# Patient Record
Sex: Male | Born: 1970
Health system: Southern US, Community
[De-identification: ages and names within clinical notes are randomized; demographics above are authoritative.]

## PROBLEM LIST (undated history)

## (undated) DIAGNOSIS — M069 Rheumatoid arthritis, unspecified: Secondary | ICD-10-CM

## (undated) DIAGNOSIS — R519 Headache, unspecified: Secondary | ICD-10-CM

## (undated) DIAGNOSIS — Z8719 Personal history of other diseases of the digestive system: Secondary | ICD-10-CM

## (undated) DIAGNOSIS — N200 Calculus of kidney: Secondary | ICD-10-CM

## (undated) DIAGNOSIS — F419 Anxiety disorder, unspecified: Secondary | ICD-10-CM

## (undated) DIAGNOSIS — N2589 Other disorders resulting from impaired renal tubular function: Secondary | ICD-10-CM

## (undated) DIAGNOSIS — I10 Essential (primary) hypertension: Secondary | ICD-10-CM

## (undated) DIAGNOSIS — M1990 Unspecified osteoarthritis, unspecified site: Secondary | ICD-10-CM

## (undated) DIAGNOSIS — J45909 Unspecified asthma, uncomplicated: Secondary | ICD-10-CM

## (undated) DIAGNOSIS — I319 Disease of pericardium, unspecified: Secondary | ICD-10-CM

## (undated) DIAGNOSIS — Z9889 Other specified postprocedural states: Secondary | ICD-10-CM

## (undated) DIAGNOSIS — M5136 Other intervertebral disc degeneration, lumbar region: Secondary | ICD-10-CM

## (undated) DIAGNOSIS — F329 Major depressive disorder, single episode, unspecified: Secondary | ICD-10-CM

## (undated) DIAGNOSIS — F32A Depression, unspecified: Secondary | ICD-10-CM

## (undated) DIAGNOSIS — R51 Headache: Secondary | ICD-10-CM

## (undated) HISTORY — DX: Depression, unspecified: F32.A

## (undated) HISTORY — PX: KIDNEY STONE SURGERY: SHX686

## (undated) HISTORY — PX: HEMORROIDECTOMY: SUR656

## (undated) HISTORY — DX: Major depressive disorder, single episode, unspecified: F32.9

## (undated) HISTORY — DX: Essential (primary) hypertension: I10

## (undated) HISTORY — PX: ANTERIOR CERVICAL DECOMP/DISCECTOMY FUSION: SHX1161

## (undated) HISTORY — DX: Other intervertebral disc degeneration, lumbar region: M51.36

## (undated) HISTORY — DX: Calculus of kidney: N20.0

## (undated) HISTORY — DX: Unspecified osteoarthritis, unspecified site: M19.90

---

## 2002-04-08 ENCOUNTER — Encounter: Payer: Self-pay | Admitting: Internal Medicine

## 2002-04-08 ENCOUNTER — Emergency Department (HOSPITAL_COMMUNITY): Admission: EM | Admit: 2002-04-08 | Discharge: 2002-04-08 | Payer: Self-pay | Admitting: Internal Medicine

## 2004-09-12 ENCOUNTER — Emergency Department (HOSPITAL_COMMUNITY): Admission: EM | Admit: 2004-09-12 | Discharge: 2004-09-12 | Payer: Self-pay | Admitting: *Deleted

## 2004-09-14 ENCOUNTER — Emergency Department (HOSPITAL_COMMUNITY): Admission: EM | Admit: 2004-09-14 | Discharge: 2004-09-14 | Payer: Self-pay | Admitting: Emergency Medicine

## 2004-09-15 ENCOUNTER — Observation Stay (HOSPITAL_COMMUNITY): Admission: AD | Admit: 2004-09-15 | Discharge: 2004-09-16 | Payer: Self-pay | Admitting: Urology

## 2004-09-17 ENCOUNTER — Observation Stay (HOSPITAL_COMMUNITY): Admission: AD | Admit: 2004-09-17 | Discharge: 2004-09-18 | Payer: Self-pay | Admitting: Urology

## 2004-09-24 ENCOUNTER — Ambulatory Visit (HOSPITAL_COMMUNITY): Admission: RE | Admit: 2004-09-24 | Discharge: 2004-09-24 | Payer: Self-pay | Admitting: Urology

## 2004-09-26 ENCOUNTER — Ambulatory Visit (HOSPITAL_COMMUNITY): Admission: RE | Admit: 2004-09-26 | Discharge: 2004-09-26 | Payer: Self-pay | Admitting: Urology

## 2004-10-02 ENCOUNTER — Ambulatory Visit (HOSPITAL_COMMUNITY): Admission: RE | Admit: 2004-10-02 | Discharge: 2004-10-02 | Payer: Self-pay | Admitting: Urology

## 2004-12-22 ENCOUNTER — Emergency Department (HOSPITAL_COMMUNITY): Admission: EM | Admit: 2004-12-22 | Discharge: 2004-12-22 | Payer: Self-pay | Admitting: Emergency Medicine

## 2006-12-09 ENCOUNTER — Emergency Department (HOSPITAL_COMMUNITY): Admission: EM | Admit: 2006-12-09 | Discharge: 2006-12-09 | Payer: Self-pay | Admitting: Emergency Medicine

## 2008-03-12 ENCOUNTER — Ambulatory Visit (HOSPITAL_COMMUNITY): Admission: RE | Admit: 2008-03-12 | Discharge: 2008-03-12 | Payer: Self-pay | Admitting: Orthopaedic Surgery

## 2008-05-14 ENCOUNTER — Ambulatory Visit (HOSPITAL_COMMUNITY): Admission: RE | Admit: 2008-05-14 | Discharge: 2008-05-14 | Payer: Self-pay | Admitting: Orthopaedic Surgery

## 2009-02-17 ENCOUNTER — Emergency Department (HOSPITAL_COMMUNITY): Admission: EM | Admit: 2009-02-17 | Discharge: 2009-02-18 | Payer: Self-pay | Admitting: Emergency Medicine

## 2009-02-19 ENCOUNTER — Encounter: Admission: RE | Admit: 2009-02-19 | Discharge: 2009-02-19 | Payer: Self-pay | Admitting: Family Medicine

## 2009-02-27 ENCOUNTER — Encounter: Admission: RE | Admit: 2009-02-27 | Discharge: 2009-02-27 | Payer: Self-pay | Admitting: Family Medicine

## 2009-09-20 ENCOUNTER — Emergency Department (HOSPITAL_COMMUNITY)
Admission: EM | Admit: 2009-09-20 | Discharge: 2009-09-21 | Payer: Self-pay | Source: Home / Self Care | Admitting: Emergency Medicine

## 2010-02-16 ENCOUNTER — Encounter: Payer: Self-pay | Admitting: Family Medicine

## 2010-04-11 LAB — DIFFERENTIAL
Basophils Absolute: 0 10*3/uL (ref 0.0–0.1)
Basophils Relative: 0 % (ref 0–1)
Eosinophils Absolute: 0.2 10*3/uL (ref 0.0–0.7)
Eosinophils Relative: 2 % (ref 0–5)
Lymphocytes Relative: 28 % (ref 12–46)
Lymphs Abs: 3.3 10*3/uL (ref 0.7–4.0)
Monocytes Absolute: 1.2 10*3/uL — ABNORMAL HIGH (ref 0.1–1.0)
Monocytes Relative: 10 % (ref 3–12)
Neutro Abs: 7.1 10*3/uL (ref 1.7–7.7)
Neutrophils Relative %: 60 % (ref 43–77)

## 2010-04-11 LAB — CBC
HCT: 39.5 % (ref 39.0–52.0)
Hemoglobin: 14 g/dL (ref 13.0–17.0)
MCH: 30.1 pg (ref 26.0–34.0)
MCHC: 35.4 g/dL (ref 30.0–36.0)
MCV: 84.9 fL (ref 78.0–100.0)
Platelets: 304 10*3/uL (ref 150–400)
RBC: 4.65 MIL/uL (ref 4.22–5.81)
RDW: 13.8 % (ref 11.5–15.5)
WBC: 11.9 10*3/uL — ABNORMAL HIGH (ref 4.0–10.5)

## 2010-04-11 LAB — URINALYSIS, ROUTINE W REFLEX MICROSCOPIC
Bilirubin Urine: NEGATIVE
Glucose, UA: NEGATIVE mg/dL
Hgb urine dipstick: NEGATIVE
Ketones, ur: NEGATIVE mg/dL
Nitrite: NEGATIVE
Protein, ur: NEGATIVE mg/dL
Specific Gravity, Urine: 1.025 (ref 1.005–1.030)
Urobilinogen, UA: 0.2 mg/dL (ref 0.0–1.0)
pH: 6 (ref 5.0–8.0)

## 2010-04-11 LAB — COMPREHENSIVE METABOLIC PANEL
ALT: 54 U/L — ABNORMAL HIGH (ref 0–53)
AST: 37 U/L (ref 0–37)
Albumin: 4 g/dL (ref 3.5–5.2)
Alkaline Phosphatase: 39 U/L (ref 39–117)
BUN: 10 mg/dL (ref 6–23)
CO2: 28 mEq/L (ref 19–32)
Calcium: 9.3 mg/dL (ref 8.4–10.5)
Chloride: 106 mEq/L (ref 96–112)
Creatinine, Ser: 1 mg/dL (ref 0.4–1.5)
GFR calc Af Amer: >60 mL/min (ref >60)
GFR calc non Af Amer: >60 mL/min (ref >60)
Glucose, Bld: 110 mg/dL — ABNORMAL HIGH (ref 70–99)
Potassium: 3.8 mEq/L (ref 3.5–5.1)
Sodium: 139 mEq/L (ref 135–145)
Total Bilirubin: 0.2 mg/dL — ABNORMAL LOW (ref 0.3–1.2)
Total Protein: 6.8 g/dL (ref 6.0–8.3)

## 2010-04-11 LAB — URINE CULTURE
Colony Count: NO GROWTH
Culture  Setup Time: 201108271112
Culture: NO GROWTH

## 2010-04-11 LAB — POCT CARDIAC MARKERS
CKMB, poc: <1 ng/mL — ABNORMAL LOW (ref 1.0–8.0)
CKMB, poc: <1 ng/mL — ABNORMAL LOW (ref 1.0–8.0)
Myoglobin, poc: 60.4 ng/mL (ref 12–200)
Myoglobin, poc: 82 ng/mL (ref 12–200)
Troponin i, poc: <0.05 ng/mL (ref 0.00–0.09)
Troponin i, poc: <0.05 ng/mL (ref 0.00–0.09)

## 2010-04-11 LAB — LIPASE, BLOOD: Lipase: 22 U/L (ref 11–59)

## 2010-04-14 LAB — URINALYSIS, ROUTINE W REFLEX MICROSCOPIC
Bilirubin Urine: NEGATIVE
Glucose, UA: NEGATIVE mg/dL
Hgb urine dipstick: NEGATIVE
Ketones, ur: NEGATIVE mg/dL
Nitrite: NEGATIVE
Protein, ur: NEGATIVE mg/dL
Specific Gravity, Urine: 1.009 (ref 1.005–1.030)
Urobilinogen, UA: 0.2 mg/dL (ref 0.0–1.0)
pH: 7.5 (ref 5.0–8.0)

## 2010-04-14 LAB — COMPREHENSIVE METABOLIC PANEL
ALT: 16 U/L (ref 0–53)
AST: 18 U/L (ref 0–37)
Albumin: 3.9 g/dL (ref 3.5–5.2)
Alkaline Phosphatase: 51 U/L (ref 39–117)
BUN: 6 mg/dL (ref 6–23)
CO2: 27 mEq/L (ref 19–32)
Calcium: 9 mg/dL (ref 8.4–10.5)
Chloride: 100 mEq/L (ref 96–112)
Creatinine, Ser: 0.86 mg/dL (ref 0.4–1.5)
GFR calc Af Amer: >60 mL/min (ref >60)
GFR calc non Af Amer: >60 mL/min (ref >60)
Glucose, Bld: 100 mg/dL — ABNORMAL HIGH (ref 70–99)
Potassium: 4 mEq/L (ref 3.5–5.1)
Sodium: 134 mEq/L — ABNORMAL LOW (ref 135–145)
Total Bilirubin: 0.2 mg/dL — ABNORMAL LOW (ref 0.3–1.2)
Total Protein: 6.9 g/dL (ref 6.0–8.3)

## 2010-04-14 LAB — CBC
HCT: 41.2 % (ref 39.0–52.0)
Hemoglobin: 14.3 g/dL (ref 13.0–17.0)
MCHC: 34.8 g/dL (ref 30.0–36.0)
MCV: 89.4 fL (ref 78.0–100.0)
Platelets: 328 10*3/uL (ref 150–400)
RBC: 4.61 MIL/uL (ref 4.22–5.81)
RDW: 13.7 % (ref 11.5–15.5)
WBC: 8.3 10*3/uL (ref 4.0–10.5)

## 2010-04-14 LAB — DIFFERENTIAL
Basophils Absolute: 0 10*3/uL (ref 0.0–0.1)
Basophils Relative: 0 % (ref 0–1)
Eosinophils Absolute: 0.1 10*3/uL (ref 0.0–0.7)
Eosinophils Relative: 2 % (ref 0–5)
Lymphocytes Relative: 26 % (ref 12–46)
Lymphs Abs: 2.2 10*3/uL (ref 0.7–4.0)
Monocytes Absolute: 0.6 10*3/uL (ref 0.1–1.0)
Monocytes Relative: 7 % (ref 3–12)
Neutro Abs: 5.4 10*3/uL (ref 1.7–7.7)
Neutrophils Relative %: 65 % (ref 43–77)

## 2010-04-14 LAB — LIPASE, BLOOD: Lipase: 21 U/L (ref 11–59)

## 2010-04-16 ENCOUNTER — Other Ambulatory Visit (HOSPITAL_COMMUNITY): Payer: Self-pay | Admitting: Orthopaedic Surgery

## 2010-04-16 DIAGNOSIS — R52 Pain, unspecified: Secondary | ICD-10-CM

## 2010-04-21 ENCOUNTER — Ambulatory Visit (HOSPITAL_COMMUNITY)
Admission: RE | Admit: 2010-04-21 | Discharge: 2010-04-21 | Disposition: A | Discharge status: Home / Self Care | Payer: PRIVATE HEALTH INSURANCE | Source: Ambulatory Visit | Attending: Orthopaedic Surgery | Admitting: Orthopaedic Surgery

## 2010-04-21 DIAGNOSIS — R52 Pain, unspecified: Secondary | ICD-10-CM

## 2010-04-21 DIAGNOSIS — M25539 Pain in unspecified wrist: Secondary | ICD-10-CM | POA: Insufficient documentation

## 2010-04-21 DIAGNOSIS — R937 Abnormal findings on diagnostic imaging of other parts of musculoskeletal system: Secondary | ICD-10-CM | POA: Insufficient documentation

## 2010-06-13 NOTE — H&P (Signed)
NAMEGEO, SLONE                ACCOUNT NO.:  [id number]   MEDICAL RECORD NO.:  [id number]          PATIENT TYPE:  OBV   LOCATION:  A307                          FACILITY:  [facility]   PHYSICIAN:  Dennie Maizes, M.D.   DATE OF BIRTH:  09-19-1970   DATE OF ADMISSION:  09/17/2004  DATE OF DISCHARGE:  LH                                HISTORY & PHYSICAL   CHIEF COMPLAINT:  Severe left flank pain radiating to the front, nausea,  vomiting.   HISTORY OF PRESENT ILLNESS:  This 40 year old male has a past history of  recurrent ureterolithiasis.  He has had kidney stones for the past five  years.  He has passed stones in the urine.  His last stone was passed about  a year ago.  He came to the emergency room on September 12, 2004 with severe  left flank pain radiating to the front, associated with nausea.  Evaluation  was done with a noncontrast CT scan of the abdomen and pelvis.  This  revealed a 4 mm sized left proximal ureteral calculus with obstruction.  He  was sent home with p.o. medications.  His pain was not adequately  controlled, and he returned to the emergency room on September 15, 2004.  I saw  him in the office, and he was in severe pain.  He was admitted to the  hospital for pain control and further treatment.  After 24 hours, he had  good pain control, and he was sent home.  The patient wanted to be  discharged.  There is no history of fever, chills, voiding difficulty, or  gross hematuria.  An x-ray, KUB, done at that time revealed a 6x3 mm sized  left ureteral calculus at the level of L4 transverse process.  The patient  had recurrent severe pain today, and he was unable to eat and drink.  He was  admitted to the hospital for pain control and definitive treatment of the  stone.   PAST MEDICAL HISTORY:  1.  Heart disease.  2.  History of pericarditis.  3.  Status post hemorrhoidectomy.  4.  History of recurrent ureterolithiasis.   MEDICATIONS:  1.  Xanax 1 mg p.o. q.6h. p.r.n.  anxiety.  2.  Tylox p.r.n. pain.   ALLERGIES:  The patient is allergic to HYDROCODONE and BENZOCAINE.   PHYSICAL EXAMINATION:  GENERAL:  The patient was in severe pain.  HEENT:  Normal.  NECK:  No masses.  LUNGS:  Clear to auscultation.  HEART:  Regular rate and rhythm.  No murmurs.  ABDOMEN:  Soft.  No palpable or flank mass.  Moderate left costovertebral  angle tenderness, as noted.  Bladder is not palpable.  No suprapubic  tenderness.  GENITOURINARY:  Penis and testis normal.   IMPRESSION:  Left upper ureteral calculus obstruction, left hydronephrosis,  left renal colic.   PLAN:  Will admit the patient to the hospital.  1.  IV fluids.  2.  Narcotics.  3.  Check x-ray of the KUB area.  4.  I discussed with the patient and his wife regarding their management  options.  He is scheduled to undergo cystoscopy, left retrograde      pyelogram, left ureteroscopy stone extraction, and ureteral stent      placement under anesthesia on September 18, 2004.  I explained to them      regarding the diagnosis, operative details, alternate treatments,      outcome, possible risks and complications, and they are in agreement for      the procedure to be done.  The ureteral stent alone is placed.  He will      require ESWL as an outpatient at a later date.      Dennie Maizes, M.D.  Electronically Signed     SK/MEDQ  D:  09/17/2004  T:  09/17/2004  Job:  [id number]

## 2010-06-13 NOTE — Op Note (Signed)
[name], [name]                ACCOUNT NO.:  [id number]   MEDICAL RECORD NO.:  [id number]          PATIENT TYPE:  AMB   LOCATION:  DAY                           FACILITY:  [facility]   PHYSICIAN:  Dennie Maizes, M.D.   DATE OF BIRTH:  [birth year]   DATE OF PROCEDURE:  10/02/2004  DATE OF DISCHARGE:                                 OPERATIVE REPORT   PREOP DIAGNOSIS:  Left upper ureteral calculus with obstruction, post stent  placement and ESL.   POSTOP DIAGNOSIS:  Left upper ureteral calculus with obstruction, post stent  placement and ESL.   OPERATIVE PROCEDURE:  Cystoscopy, removal of the left ureteral stent.   ANESTHESIA:  Monitored anesthesia care.   SURGEON:  Dennie Maizes, M.D.   COMPLICATIONS:  None.   DRAINS:  None.   SPECIMEN:  Left ureteral stent which was discarded.   INDICATIONS FOR PROCEDURE:  This 40 year old male was admitted to hospital  with severe left flank pain. He had a left upper ureteral calculus with  obstruction. Cystoscopy, left ureteral stent placement and ESWL of the stone  were done. Follow-up x-rays revealed the stone has been fragmented well. The  patient was brought to the OR today for cystoscopy and stent removal.   DESCRIPTION OF PROCEDURE:  With IV sedation, the patient was placed on the  OR table in the dorsolithotomy position. The lower abdomen and genitalia  were prepped and draped in a sterile fashion.  About 5 mL of 2% Xylocaine  jelly was instilled into the urethra and the penis was clamped for about 5  minutes. Cystoscopy was done with a 22-French scope. The urethra, prostate  and bladder were normal. The lower end of the ureteral stent was then held  with a grasping forceps and removed without any difficulty. The patient was  transferred to the PACU in satisfactory condition.      Dennie Maizes, M.D.  Electronically Signed     SK/MEDQ  D:  10/02/2004  T:  10/02/2004  Job:  [id number]

## 2010-06-13 NOTE — Op Note (Signed)
[name], [name]                ACCOUNT NO.:  [id number]   MEDICAL RECORD NO.:  [id number]          PATIENT TYPE:  OBV   LOCATION:  A307                          FACILITY:  [facility]   PHYSICIAN:  Dennie Maizes, M.D.   DATE OF BIRTH:  1970-03-23   DATE OF PROCEDURE:  09/18/2004  DATE OF DISCHARGE:                                 OPERATIVE REPORT   PREOPERATIVE DIAGNOSIS:  Left upper ureteral calculus with obstruction, left  renal colic, left hydronephrosis.   POSTOPERATIVE DIAGNOSIS:  Left upper ureteral calculus with obstruction,  left renal colic, left hydronephrosis.   OPERATIVE PROCEDURE:  Cystoscopy, left retrograde pyelogram and left  ureteral stent placement.   ANESTHESIA:  General.   SURGEON:  Dr. Rito Ehrlich.   COMPLICATIONS:  None.   ESTIMATED BLOOD LOSS:  None.   DRAINS:  6-French 26-cm size left ureteral stent.   SPECIMEN:  None.   INDICATIONS FOR PROCEDURE:  This 40 year old male has a past history of  recurrent urolithiasis. He was admitted to the hospital with severe left  flank pain. X-rays revealed a 6 x 3 mm sized left upper ureteral calculus to  the level of L4 transverse process. The patient had severe persistent pain,  and he was unable to pass the stone. He is taken to the OR today for  cystoscopy, left retrograde pyelogram and left ureteral stent placement.   DESCRIPTION OF PROCEDURE:  General anesthesia was induced, and the patient  was placed on the OR table in the dorsolithotomy position. The lower abdomen  and genitalia were prepped and draped in a sterile fashion. Cystoscopy was  done with a 24-French scope. The appearance of the prostate, ureter and  bladder were normal. A 5-French wedge catheter was then placed in the left  ureteral orifice. Retrograde pyelogram was done by using about 7 cc of  Renografin 60. This revealed a large filling defect at level of L4  transverse process in the left ureter. There is proximal hydroureter and  hydronephrosis.   A 5-French open-ended catheter was then placed in the left distal ureter. A  0.038-inch  Benson guide with the flexible tip was then advanced into the  left renal pelvis. Open-ended catheter was then removed. A 6-French 26-cm  size stent was then inserted into left collecting system. The cystoscope was  removed. The patient was transferred to the PACU in a satisfactory  condition.   The plan is to treat the obstructing stone with ESL as an outpatient at a  later date.      Dennie Maizes, M.D.  Electronically Signed     SK/MEDQ  D:  09/18/2004  T:  09/18/2004  Job:  [id number]

## 2010-06-13 NOTE — H&P (Signed)
NAMENAETHAN, [name]                ACCOUNT NO.:  [id number]   MEDICAL RECORD NO.:  [id number]          PATIENT TYPE:  INP   LOCATION:  A323                          FACILITY:  [facility]   PHYSICIAN:  Dennie Maizes, M.D.   DATE OF BIRTH:  [birth year]   DATE OF ADMISSION:  09/15/2004  DATE OF DISCHARGE:  LH                                HISTORY & PHYSICAL   CHIEF COMPLAINT:  Severe left flank pain radiating to the front, nausea.   HISTORY OF PRESENT ILLNESS:  This 40 year old male has a past history of  recurrent urolithiasis.  He has had kidney stones for the past 5 years.  He  has passed four or five stones.  The last stone was passed about a year ago.   He came to the emergency room on September 12, 2004 with severe left flank pain  radiating to the front associated with nausea.  Evaluation was done with a  noncontrast CT scan of the abdomen and pelvis.  This revealed a 4 mm size  left proximal ureteral calculus, mild hydronephrosis and perinephric edema.  The patient was sent home with p.o. medications.  His pain was not  adequately and he returned to the emergency room again on September 15, 2004.  I seen him in the office earlier today.  He was in severe pain.  He has been  admitted to the hospital for pain control and further treatment.  The  patient denied having any fever, chills, voiding difficulty or gross  hematuria.   PAST MEDICAL HISTORY:  Heart disease, history of pericarditis, status post  hemorrhoidectomy, history of recurrent urolithiasis.   MEDICATIONS:  Xanax 1 mg one p.o. q.6 h. p.r.n. anxiety.   ALLERGIES:  The patient is allergic to HYDROCODONE, ACETAMINOPHEN and  BENZOCAINE.   EXAMINATION:  GENERAL:  The patient was in severe pain.  HEENT:  Head, eyes, ears, nose and throat normal.  NECK:  No masses.  CHEST:  Lungs clear to auscultation.  Heart regular rate and rhythm, no  murmurs.  ABDOMEN:  Abdomen soft, no palpable flank mass, moderate left costovertebral  angle tenderness was noted, bladder not palpable, no suprapubic tenderness.  GU:  Penis and testes are normal.   ADMISSION LABORATORIES:  CBC:  WBC 8.6, hemoglobin 12.4, hematocrit 35.2.  BUN 5, creatinine 0.9, glucose is mildly elevated at 115, calcium is 8.8,  electrolytes within normal range.  Urinalysis:  Blood large, rbc's 2-3 per  high-power field, nitrites negative, leukocyte esterase negative.   IMPRESSION:  Left upper ureteral calculus with obstruction, left  hydronephrosis, left renal colic.   PLAN:  1.  Admit the patient to the hospital.  2.  IV fluids.  3.  Parenteral  narcotics.  4.  Check x-ray of the KUB area.  5.  Strain all urine.  6.  Discuss with the patient regarding management options.      Dennie Maizes, M.D.  Electronically Signed     SK/MEDQ  D:  09/15/2004  T:  09/15/2004  Job:  [id number]

## 2010-06-13 NOTE — H&P (Signed)
[name], [name]                ACCOUNT NO.:  [id number]   MEDICAL RECORD NO.:  [id number]          PATIENT TYPE:  AMB   LOCATION:  DAY                           FACILITY:  [facility]   PHYSICIAN:  Dennie Maizes, M.D.   DATE OF BIRTH:  1971/01/12   DATE OF ADMISSION:  09/24/2004  DATE OF DISCHARGE:  LH                                HISTORY & PHYSICAL   CHIEF COMPLAINT:  Left upper ureteral calculous obstruction, status post  left ureteral stent placement.   HISTORY OF PRESENT ILLNESS:  A 40 year old male with a history of recurrent  urolithiasis.  He has had several stones in the past five years.  Has passed  four stones so far.  Last one was passed one year ago.  He experienced  severe left flank pain radiating to the front, associated with nausea, about  two weeks ago. He went to the emergency room at [facility].  Evaluation was done with a non-contrast CT scan of the abdomen and pelvis  which revealed two punctate, small left renal calculi without obstruction.  The patient also had a 4 mm proximal left ureteral calculous obstruction and  perinephric edema.  He had severe persistent pain.  He was admitted to the  hospital for observation x2.  He was unable to pass the stone.  He has  undergone cystoscopy, retrograde pyelogram and left ureteral stent  placement.  He has relief of flank pain at present.  He is brought to the  short-stay center today for extracorporeal shock wave lithotripsy of the  left upper ureteral calculus.  The patient denied having any fevers, chills,  voiding difficulty or gross hematuria at present.   PAST MEDICAL HISTORY:  1.  Heart disease.  The patient has pericarditis.  2.  Anxiety.  3.  Status post hemorrhoidectomy.   MEDICATIONS:  Tylox.   ALLERGIES:  BENZOCAINE, VICODIN.   FAMILY HISTORY:  Positive for diabetes mellitus, heart disease and cancer.   PHYSICAL EXAMINATION:  VITAL SIGNS:  Height 5 feet 8 inches, weight 135  pounds.  HEENT:   Normal.  NECK:  No masses.  LUNGS:  Clear to auscultation.  HEART:  Regular rate and rhythm, no murmur.  ABDOMEN:  Soft, no palpable flank mass.  No CVA tenderness.  No suprapubic  tenderness.  GU:  Penis and testes are normal.   IMPRESSION:  Left upper ureteral calculus with obstruction, status post left  ureteral stent placement.   PLAN:  ESL of the left upper ureteral calculus with IV sedation in the short-  stay center. I have discussed with the patient regarding the diagnosis,  operative details, alternate treatments, all possible risks and  complications, and he has agreed for the procedure to be done.      Dennie Maizes, M.D.  Electronically Signed     SK/MEDQ  D:  09/23/2004  T:  09/24/2004  Job:  [id number]

## 2010-06-13 NOTE — H&P (Signed)
[name], [name]                ACCOUNT NO.:  [id number]   MEDICAL RECORD NO.:  [id number]          PATIENT TYPE:  AMB   LOCATION:  DAY                           FACILITY:  [facility]   PHYSICIAN:  Dennie Maizes, M.D.   DATE OF BIRTH:  Feb 27, 1970   DATE OF ADMISSION:  10/02/2004  DATE OF DISCHARGE:  LH                                HISTORY & PHYSICAL   CHIEF COMPLAINT:  Left upper ureteral calculus with obstruction, post  ureteral stent placement and lithotripsy.   HISTORY OF PRESENT ILLNESS:  This 40 year old male has a history of  recurrent ureterolithiasis.  He has had kidney stones for the past four or  five years.  He has passed four stones so far.  He was admitted to [facility] with severe left flank pain.  Evaluation revealed left upper  ureteral calculus, measuring 4 mm in size with obstruction.  The patient  could not pass the stone and was in severe pain.  He has undergone  cystoscopy, left ureteral stent placement, and ESWL of the calculus.  Follow-  up KUB revealed good fragmentation of the left upper ureteral calculus.  The  patient is brought to the [facility] today for cystoscopy and removal  of left ureteral stent.  He does not have any fever, chills, voiding  difficulty, or gross hematuria at present.   PAST MEDICAL HISTORY:  1.  History of heart disease.  2.  Pericarditis.  3.  Anxiety.  4.  Status post hemorrhoidectomy.   MEDICATIONS:  Tylox.   ALLERGIES:  1.  BENZOCAINE.  2.  VICODIN.   FAMILY HISTORY:  Positive for diabetes mellitus, heart disease, and cancer.   PHYSICAL EXAMINATION:  VITAL SIGNS:  Height 5 feet 8 inches, weight 135  pounds.  HEENT:  Normal.  NECK:  No masses.  LUNGS:  Clear to auscultation.  HEART:  Regular rate and rhythm, no murmurs.  ABDOMEN:  Soft, no palpable flank masses, CVA tenderness.  No suprapubic  tenderness.  GENITALIA:  Penis and testes are normal.   IMPRESSION:  Left upper ureteral calculus with  obstruction, status post left  ureteral stent placement, status post ESWL of left upper ureteral calculus.   PLAN:  Cystoscopy and removal of the left ureteral stent under anesthesia in  Short Stay Center.  I have explained to the patient and his family regarding  the diagnosis, operative details, alternate treatments, outcome, possible  risks and complications and he has agreed for the procedure to be done.      Dennie Maizes, M.D.  Electronically Signed     SK/MEDQ  D:  10/01/2004  T:  10/01/2004  Job:  [id number]

## 2010-08-25 ENCOUNTER — Encounter (HOSPITAL_COMMUNITY): Payer: Self-pay | Admitting: Radiology

## 2010-08-25 ENCOUNTER — Emergency Department (HOSPITAL_COMMUNITY): Payer: PRIVATE HEALTH INSURANCE

## 2010-08-25 ENCOUNTER — Emergency Department (HOSPITAL_COMMUNITY)
Admission: EM | Admit: 2010-08-25 | Discharge: 2010-08-25 | Disposition: A | Discharge status: Home / Self Care | Payer: PRIVATE HEALTH INSURANCE | Attending: Emergency Medicine | Admitting: Emergency Medicine

## 2010-08-25 DIAGNOSIS — N1330 Unspecified hydronephrosis: Secondary | ICD-10-CM | POA: Insufficient documentation

## 2010-08-25 DIAGNOSIS — Z862 Personal history of diseases of the blood and blood-forming organs and certain disorders involving the immune mechanism: Secondary | ICD-10-CM | POA: Insufficient documentation

## 2010-08-25 DIAGNOSIS — Z8639 Personal history of other endocrine, nutritional and metabolic disease: Secondary | ICD-10-CM | POA: Insufficient documentation

## 2010-08-25 DIAGNOSIS — Z79899 Other long term (current) drug therapy: Secondary | ICD-10-CM | POA: Insufficient documentation

## 2010-08-25 DIAGNOSIS — R339 Retention of urine, unspecified: Secondary | ICD-10-CM | POA: Insufficient documentation

## 2010-08-25 DIAGNOSIS — N201 Calculus of ureter: Secondary | ICD-10-CM | POA: Insufficient documentation

## 2010-08-25 DIAGNOSIS — N390 Urinary tract infection, site not specified: Secondary | ICD-10-CM | POA: Insufficient documentation

## 2010-08-25 DIAGNOSIS — N3943 Post-void dribbling: Secondary | ICD-10-CM | POA: Insufficient documentation

## 2010-08-25 DIAGNOSIS — R109 Unspecified abdominal pain: Secondary | ICD-10-CM | POA: Insufficient documentation

## 2010-08-25 DIAGNOSIS — R112 Nausea with vomiting, unspecified: Secondary | ICD-10-CM | POA: Insufficient documentation

## 2010-08-25 DIAGNOSIS — M129 Arthropathy, unspecified: Secondary | ICD-10-CM | POA: Insufficient documentation

## 2010-08-25 HISTORY — DX: Calculus of kidney: N20.0

## 2010-08-25 HISTORY — DX: Other specified postprocedural states: Z98.890

## 2010-08-25 HISTORY — DX: Disease of pericardium, unspecified: I31.9

## 2010-08-25 HISTORY — DX: Other disorders resulting from impaired renal tubular function: N25.89

## 2010-08-25 LAB — URINALYSIS, ROUTINE W REFLEX MICROSCOPIC
Bilirubin Urine: NEGATIVE
Glucose, UA: NEGATIVE mg/dL
Ketones, ur: NEGATIVE mg/dL
Nitrite: NEGATIVE
Protein, ur: NEGATIVE mg/dL
Specific Gravity, Urine: 1.017 (ref 1.005–1.030)
Urobilinogen, UA: 0.2 mg/dL (ref 0.0–1.0)
pH: 7.5 (ref 5.0–8.0)

## 2010-08-25 LAB — URINE MICROSCOPIC-ADD ON

## 2010-08-25 LAB — POCT I-STAT, CHEM 8
BUN: 13 mg/dL (ref 6–23)
Calcium, Ion: 1.21 mmol/L (ref 1.12–1.32)
Chloride: 105 mEq/L (ref 96–112)
Creatinine, Ser: 1.2 mg/dL (ref 0.50–1.35)
Glucose, Bld: 153 mg/dL — ABNORMAL HIGH (ref 70–99)
HCT: 44 % (ref 39.0–52.0)
Hemoglobin: 15 g/dL (ref 13.0–17.0)
Potassium: 4 mEq/L (ref 3.5–5.1)
Sodium: 140 mEq/L (ref 135–145)
TCO2: 23 mmol/L (ref 0–100)

## 2010-08-26 LAB — URINE CULTURE
Colony Count: NO GROWTH
Culture  Setup Time: 201207301718
Culture: NO GROWTH

## 2010-08-29 ENCOUNTER — Ambulatory Visit (HOSPITAL_COMMUNITY)
Admission: RE | Admit: 2010-08-29 | Discharge: 2010-08-29 | Disposition: A | Discharge status: Home / Self Care | Payer: PRIVATE HEALTH INSURANCE | Source: Ambulatory Visit | Attending: Urology | Admitting: Urology

## 2010-08-29 DIAGNOSIS — F17200 Nicotine dependence, unspecified, uncomplicated: Secondary | ICD-10-CM | POA: Insufficient documentation

## 2010-08-29 DIAGNOSIS — M109 Gout, unspecified: Secondary | ICD-10-CM | POA: Insufficient documentation

## 2010-08-29 DIAGNOSIS — I10 Essential (primary) hypertension: Secondary | ICD-10-CM | POA: Insufficient documentation

## 2010-08-29 DIAGNOSIS — J45909 Unspecified asthma, uncomplicated: Secondary | ICD-10-CM | POA: Insufficient documentation

## 2010-08-29 DIAGNOSIS — N201 Calculus of ureter: Secondary | ICD-10-CM | POA: Insufficient documentation

## 2010-08-29 DIAGNOSIS — R3915 Urgency of urination: Secondary | ICD-10-CM | POA: Insufficient documentation

## 2010-08-29 LAB — SURGICAL PCR SCREEN
MRSA, PCR: NEGATIVE
Staphylococcus aureus: NEGATIVE

## 2010-09-09 NOTE — Op Note (Signed)
  NAMESHED, NIXON NO.:  [id number]  MEDICAL RECORD NO.:  [id number]  LOCATION:  DAYL                         FACILITY:  [facility]  PHYSICIAN:  Excell Seltzer. Annabell Howells, M.D.    DATE OF BIRTH:  29-Jan-1970  DATE OF PROCEDURE:  08/29/2010 DATE OF DISCHARGE:  08/29/2010                              OPERATIVE REPORT   PROCEDURE:  Right ureteroscopic stone extraction.  PREOPERATIVE DIAGNOSIS:  Right UVJ stone with pain and urgency.  POSTOPERATIVE DIAGNOSIS:  Right UVJ stone with pain and urgency.  SURGEON:  Excell Seltzer. Annabell Howells, M.D.  ANESTHESIA:  General.  SPECIMENS:  Stone.  DRAIN:  None.  COMPLICATIONS:  None.  INDICATIONS:  Mr. Clabo is a 40 year old white male who presented to the office with a 5-mm right UVJ stone.  It was initially seen on CT on 07/30.  He has had persistent symptoms and after reviewing the options, he has elected to undergo ureteroscopy.  FINDINGS OF PROCEDURE:  He was taken operating room where he was given Cipro IV.  General anesthetic was induced.  He was placed in lithotomy position.  His perineum and genitalia were prepped with Betadine solution.  He was draped in usual sterile fashion.  Cystoscopy was performed using a 22-French scope and 12 and 70-degree lenses. Examination revealed a normal urethra.  The prostatic urethra was short with obstruction.  Examination of the bladder revealed no abnormalities. The left ureteral orifice was unremarkable and the right ureteral orifice demonstrated a stone at the UVJ.  A guidewire was passed by the stone and the ureteroscope was advanced over the wire then backed out and placed alongside the wire.  The stone was grasped with a nitinol basket and removed without difficulty.  It was felt that a stent was not indicated.  At this point, the wire was removed.  The patient's bladder was draining.  He was taken down from lithotomy position.  His anesthetic was reversed, he was moved to the  recovery room in stable condition.  There were no complications.     Excell Seltzer. Annabell Howells, M.D.     JJW/MEDQ  D:  09/05/2010  T:  09/06/2010  Job:  [id number]  Electronically Signed by Bjorn Pippin M.D. on 09/09/2010 08:27:16 AM

## 2010-11-04 LAB — DIFFERENTIAL
Basophils Absolute: 0
Basophils Relative: 1
Eosinophils Absolute: 0.1 — ABNORMAL LOW
Eosinophils Relative: 1
Lymphocytes Relative: 25
Lymphs Abs: 2.2
Monocytes Absolute: 0.6
Monocytes Relative: 6
Neutro Abs: 6
Neutrophils Relative %: 67

## 2010-11-04 LAB — BASIC METABOLIC PANEL
BUN: 16
CO2: 30
Calcium: 9.8
Chloride: 102
Creatinine, Ser: 0.86
GFR calc Af Amer: >60
GFR calc non Af Amer: >60
Glucose, Bld: 98
Potassium: 4.4
Sodium: 139

## 2010-11-04 LAB — CBC
HCT: 43.9
Hemoglobin: 14.9
MCHC: 34
MCV: 86.2
Platelets: 363
RBC: 5.09
RDW: 13.3
WBC: 8.9

## 2010-11-04 LAB — POCT CARDIAC MARKERS
CKMB, poc: <1 — ABNORMAL LOW
Myoglobin, poc: 72.6
Operator id: 216471
Troponin i, poc: <0.05

## 2010-11-04 LAB — D-DIMER, QUANTITATIVE: D-Dimer, Quant: 0.62 — ABNORMAL HIGH

## 2011-05-15 ENCOUNTER — Emergency Department (HOSPITAL_COMMUNITY)
Admission: EM | Admit: 2011-05-15 | Discharge: 2011-05-16 | Disposition: A | Discharge status: Home / Self Care | Payer: 59 | Attending: Emergency Medicine | Admitting: Emergency Medicine

## 2011-05-15 DIAGNOSIS — Y92009 Unspecified place in unspecified non-institutional (private) residence as the place of occurrence of the external cause: Secondary | ICD-10-CM | POA: Insufficient documentation

## 2011-05-15 DIAGNOSIS — S8990XA Unspecified injury of unspecified lower leg, initial encounter: Secondary | ICD-10-CM

## 2011-05-15 DIAGNOSIS — M069 Rheumatoid arthritis, unspecified: Secondary | ICD-10-CM | POA: Insufficient documentation

## 2011-05-15 DIAGNOSIS — M79609 Pain in unspecified limb: Secondary | ICD-10-CM | POA: Insufficient documentation

## 2011-05-15 DIAGNOSIS — I10 Essential (primary) hypertension: Secondary | ICD-10-CM | POA: Insufficient documentation

## 2011-05-15 HISTORY — DX: Essential (primary) hypertension: I10

## 2011-05-15 HISTORY — DX: Anxiety disorder, unspecified: F41.9

## 2011-05-15 HISTORY — DX: Rheumatoid arthritis, unspecified: M06.9

## 2011-05-15 NOTE — ED Notes (Signed)
Per pt, he was thrown off horse last Friday; pt having pain in R leg, has brace on; pt reports pain is on R lateral side of leg starting right under knee; reports that he heard something pop in leg; pt reports using ice and ibuprofen at home with no relief, last dose at 20:00; pt has + sensation; pt has knot on front of leg; walking makes pain worse; pt ambulatory to triage

## 2011-05-16 ENCOUNTER — Emergency Department (HOSPITAL_COMMUNITY): Payer: 59

## 2011-05-16 ENCOUNTER — Encounter (HOSPITAL_COMMUNITY): Payer: Self-pay | Admitting: Emergency Medicine

## 2011-05-16 MED ORDER — HYDROCODONE-ACETAMINOPHEN 5-325 MG PO TABS: 1.0000 | ORAL_TABLET | ORAL | Status: AC | PRN

## 2011-05-16 MED ORDER — HYDROCODONE-ACETAMINOPHEN 5-325 MG PO TABS
1.0000 | ORAL_TABLET | Freq: Once | ORAL | Status: AC
  Administered 2011-05-16: 1 via ORAL
  Filled 2011-05-16: qty 1

## 2011-05-16 NOTE — ED Provider Notes (Signed)
Medical screening examination/treatment/procedure(s) were performed by non-physician practitioner and as supervising physician I was immediately available for consultation/collaboration.  Leif Loflin, MD 05/16/11 0341 

## 2011-05-16 NOTE — Discharge Instructions (Signed)
YOUR X-RAY IS NEGATIVE FOR ANY FRACTURES AND YOUR EXAM SUPPORTS A POTENTIAL TENDON INJURY. USE THE IMMOBILIZER FOR COMFORT, WEIGHT BEARING AS TOLERATED. FOLLOW UP WITH DR. BLACKMAN FOR FURTHER EVALUATION BY CALLING HIS OFFICE TO SCHEDULE A TIME TO BE SEEN. NORCO FOR PAIN AS NEEDED.

## 2011-05-16 NOTE — ED Provider Notes (Signed)
History     CSN: [id number]  Arrival date & time 05/15/11  2350   First MD Initiated Contact with Patient 05/16/11 0047      Chief Complaint  Patient presents with  . Leg Pain    (Consider location/radiation/quality/duration/timing/severity/associated sxs/prior treatment) Patient is a 41 y.o. male presenting with leg pain. The history is provided by the patient and the spouse.  Leg Pain  The incident occurred more than 1 week ago. The incident occurred at home. Injury mechanism: He was thrown from a horse and landed standing with full impact on right leg. Pain location: Since the fall, he has pain in the lateral and proximal right lower leg that extends into lateral knee when he bears weight, plantar- or dorsi-flexes foot. The pain is moderate.    Past Medical History  Diagnosis Date  . Pericarditis   . History of lithotripsy   . Rheumatoid arthritis   . Gout   . Hypertension   . Kidney stones   . Renal tubular acidosis   . Anxiety     Past Surgical History  Procedure Date  . Hemorroidectomy   . Kidney stone surgery     History reviewed. No pertinent family history.  History  Substance Use Topics  . Smoking status: Current Everyday Smoker -- 1.0 packs/day  . Smokeless tobacco: Not on file  . Alcohol Use: No      Review of Systems  Respiratory: Negative.  Negative for shortness of breath.   Cardiovascular: Negative.  Negative for chest pain.  Musculoskeletal: Negative.        See HPI  Skin: Negative.     Allergies  Benadryl and Benzocaine  Home Medications   Current Outpatient Rx  Name Route Sig Dispense Refill  . ALBUTEROL SULFATE HFA 108 (90 BASE) MCG/ACT IN AERS Inhalation Inhale 2 puffs into the lungs every 4 (four) hours as needed. For shortness of breath    . ALLOPURINOL 100 MG PO TABS Oral Take 100 mg by mouth daily.    . IBUPROFEN 200 MG PO TABS Oral Take 800 mg by mouth every 6 (six) hours as needed. For pain      BP 152/98  Pulse 94   Temp(Src) 97.6 F (36.4 C) (Oral)  Resp 20  SpO2 98%  Physical Exam  Constitutional: He is oriented to person, place, and time. He appears well-developed and well-nourished.  Neck: Normal range of motion.  Pulmonary/Chest: Effort normal.  Musculoskeletal: Normal range of motion.       Right leg unremarkable in appearance. No swelling, redness, or bony deformity. Calf is soft and nontender. There is point tenderness to lateral right calf at proximal 1/3 without mass or discoloration. FROM. Increased pain the plantar flexion and dorsiflexion of foot.  Neurological: He is alert and oriented to person, place, and time.  Skin: Skin is warm and dry.  Psychiatric: He has a normal mood and affect.    ED Course  Procedures (including critical care time)  Labs Reviewed - No data to display Dg Tibia/fibula Right  05/16/2011  *RADIOLOGY REPORT*  Clinical Data: Trauma, fall from horse, bilateral tib-fib pain  RIGHT TIBIA AND FIBULA - 2 VIEW  Comparison: None.  Findings: No fracture or dislocation is seen.  The joint spaces are preserved.  The visualized soft tissues are unremarkable.  IMPRESSION: No fracture or dislocation is seen.  Original Report Authenticated By: Charline Bills, M.D.     No diagnosis found.  1. Right lower extremity injury  MDM  Suspect tendon injury with persistent pain and no fracture. Will apply knee immobilizer and encourage crutch use, and ortho follow up. Pain medication given.        Rodena Medin, PA-C 05/16/11 0216

## 2011-07-27 ENCOUNTER — Ambulatory Visit: Payer: 59 | Attending: Orthopaedic Surgery | Admitting: Physical Therapy

## 2011-07-27 DIAGNOSIS — R5381 Other malaise: Secondary | ICD-10-CM | POA: Insufficient documentation

## 2011-07-27 DIAGNOSIS — IMO0001 Reserved for inherently not codable concepts without codable children: Secondary | ICD-10-CM | POA: Insufficient documentation

## 2011-07-27 DIAGNOSIS — M25569 Pain in unspecified knee: Secondary | ICD-10-CM | POA: Insufficient documentation

## 2011-07-31 ENCOUNTER — Ambulatory Visit: Payer: 59 | Admitting: Physical Therapy

## 2011-08-04 ENCOUNTER — Ambulatory Visit: Payer: 59 | Admitting: Physical Therapy

## 2011-08-06 ENCOUNTER — Ambulatory Visit: Payer: 59 | Admitting: Physical Therapy

## 2011-08-10 ENCOUNTER — Ambulatory Visit: Payer: 59 | Admitting: Physical Therapy

## 2011-10-22 ENCOUNTER — Encounter (HOSPITAL_COMMUNITY): Payer: Self-pay

## 2011-10-22 ENCOUNTER — Emergency Department (HOSPITAL_COMMUNITY)
Admission: EM | Admit: 2011-10-22 | Discharge: 2011-10-22 | Disposition: A | Discharge status: Home / Self Care | Payer: 59 | Attending: Emergency Medicine | Admitting: Emergency Medicine

## 2011-10-22 ENCOUNTER — Emergency Department (HOSPITAL_COMMUNITY): Payer: 59

## 2011-10-22 DIAGNOSIS — M069 Rheumatoid arthritis, unspecified: Secondary | ICD-10-CM | POA: Insufficient documentation

## 2011-10-22 DIAGNOSIS — X500XXA Overexertion from strenuous movement or load, initial encounter: Secondary | ICD-10-CM | POA: Insufficient documentation

## 2011-10-22 DIAGNOSIS — M549 Dorsalgia, unspecified: Secondary | ICD-10-CM

## 2011-10-22 DIAGNOSIS — M545 Low back pain, unspecified: Secondary | ICD-10-CM | POA: Insufficient documentation

## 2011-10-22 DIAGNOSIS — Z884 Allergy status to anesthetic agent status: Secondary | ICD-10-CM | POA: Insufficient documentation

## 2011-10-22 DIAGNOSIS — Z79899 Other long term (current) drug therapy: Secondary | ICD-10-CM | POA: Insufficient documentation

## 2011-10-22 DIAGNOSIS — F17200 Nicotine dependence, unspecified, uncomplicated: Secondary | ICD-10-CM | POA: Insufficient documentation

## 2011-10-22 DIAGNOSIS — Z87442 Personal history of urinary calculi: Secondary | ICD-10-CM | POA: Insufficient documentation

## 2011-10-22 DIAGNOSIS — Z888 Allergy status to other drugs, medicaments and biological substances status: Secondary | ICD-10-CM | POA: Insufficient documentation

## 2011-10-22 DIAGNOSIS — I10 Essential (primary) hypertension: Secondary | ICD-10-CM | POA: Insufficient documentation

## 2011-10-22 MED ORDER — OXYCODONE-ACETAMINOPHEN 5-325 MG PO TABS: 1.0000 | ORAL_TABLET | ORAL | Status: DC | PRN

## 2011-10-22 MED ORDER — OXYCODONE-ACETAMINOPHEN 5-325 MG PO TABS
2.0000 | ORAL_TABLET | Freq: Once | ORAL | Status: AC
  Administered 2011-10-22: 2 via ORAL
  Filled 2011-10-22: qty 2

## 2011-10-22 MED ORDER — METHOCARBAMOL 500 MG PO TABS: 500.0000 mg | ORAL_TABLET | Freq: Two times a day (BID) | ORAL | Status: DC

## 2011-10-22 MED ORDER — DIAZEPAM 5 MG PO TABS
5.0000 mg | ORAL_TABLET | Freq: Once | ORAL | Status: AC
  Administered 2011-10-22: 5 mg via ORAL
  Filled 2011-10-22: qty 1

## 2011-10-22 MED ORDER — PREDNISONE 20 MG PO TABS: 60.0000 mg | ORAL_TABLET | Freq: Every day | ORAL | Status: DC

## 2011-10-22 NOTE — ED Provider Notes (Signed)
History     CSN: [id number]  Arrival date & time 10/22/11  0035   First MD Initiated Contact with Patient 10/22/11 0122      Chief Complaint  Patient presents with  . Back Pain   HPI  History provided by the patient. Patient is a 41 year old male with history of rheumatoid arthritis, gout, hypertension, kidney stones who presents with complaints of left lower back pain. Patient reports having back pain for the past several days. He reports that approximately 6-7 days ago he sat up in bed and felt a pop in his middle lower back which caused immediate sharp pains radiating to left hip and anterior thigh area. Since that time pain has been waxing and waning but has become much worse. Pain is worse with leaning forward and standing and walking. Pain improves some with position in a recliner and pillows behind the back. Patient has been taking ibuprofen without significant relief of symptoms. Patient also reports having prior history of possible ruptured disc in back many years ago but states he never had much evaluation. Symptoms did not feel like previous kidney stones. He denies any dysuria, hematuria or urinary frequency. Patient has not had any urinary or fecal incontinence, urinary retention or perineal numbness. He denies any numbness or weakness of the feet.   Past Medical History  Diagnosis Date  . Pericarditis   . History of lithotripsy   . Rheumatoid arthritis   . Gout   . Hypertension   . Kidney stones   . Renal tubular acidosis   . Anxiety     Past Surgical History  Procedure Date  . Hemorroidectomy   . Kidney stone surgery     History reviewed. No pertinent family history.  History  Substance Use Topics  . Smoking status: Current Every Day Smoker -- 1.0 packs/day  . Smokeless tobacco: Not on file  . Alcohol Use: No      Review of Systems  Constitutional: Negative for fever and unexpected weight change.  HENT: Negative for neck pain.   Respiratory: Negative  for shortness of breath.   Cardiovascular: Negative for chest pain.  Gastrointestinal: Negative for abdominal pain.  Genitourinary: Negative for dysuria, frequency, hematuria and flank pain.  Musculoskeletal: Positive for back pain.  Neurological: Negative for weakness and numbness.    Allergies  Benadryl and Benzocaine  Home Medications   Current Outpatient Rx  Name Route Sig Dispense Refill  . ALBUTEROL SULFATE HFA 108 (90 BASE) MCG/ACT IN AERS Inhalation Inhale 2 puffs into the lungs every 4 (four) hours as needed. For shortness of breath    . ALLOPURINOL 300 MG PO TABS Oral Take 300 mg by mouth daily.    Marland Kitchen DICLOFENAC SODIUM 75 MG PO TBEC Oral Take 75 mg by mouth daily as needed. Swelling and pain    . IBUPROFEN 200 MG PO TABS Oral Take 800 mg by mouth every 6 (six) hours as needed. For pain      BP 147/88  Pulse 87  Temp 97.8 F (36.6 C) (Oral)  Resp 18  SpO2 94%  Physical Exam  Nursing note and vitals reviewed. Constitutional: He is oriented to person, place, and time. He appears well-developed and well-nourished. No distress.  HENT:  Head: Normocephalic and atraumatic.  Neck: Normal range of motion. Neck supple.  Cardiovascular: Normal rate and regular rhythm.   Pulmonary/Chest: Effort normal and breath sounds normal. No respiratory distress. He has no wheezes. He has no rales.  Abdominal: Soft. There  is no tenderness. There is no CVA tenderness.  Musculoskeletal:       Lumbar back: He exhibits tenderness and bony tenderness. He exhibits no swelling, no edema and no deformity.       Back:  Neurological: He is alert and oriented to person, place, and time. He has normal strength. No sensory deficit. Gait normal.  Skin: Skin is warm. No rash noted. No erythema.  Psychiatric: He has a normal mood and affect.    ED Course  Procedures   Dg Lumbar Spine Complete  10/22/2011  *RADIOLOGY REPORT*  Clinical Data: Low back pain.  History of herniated disc.  LUMBAR SPINE -  COMPLETE 4+ VIEW  Comparison: 08/25/2010 CT.  Findings: Five lumbar type vertebral bodies.  The alignment is anatomic.  There are no pars defects.  Vertebral body height and intervertebral disc spaces are preserved.  Tiny marginal osteophytes are present at L3-L4.  Schmorl's nodes at T12. Lumbosacral junction appears normal.  IMPRESSION: No acute abnormality.   Original Report Authenticated By: Andreas Newport, M.D.      1. Back pain       MDM  1:55 AM patient seen and evaluated. Patient appears moderately uncomfortable.  X-rays unremarkable. Patient having some improvement of pain. Similar discharge with symptomatic treatment including Percocet, prednisone and Robaxin. Patient given spine followup.      Angus Seller, [place] 10/22/11 [phone]

## 2011-10-22 NOTE — ED Notes (Addendum)
Pt reports lower back pain radiating into (L) groin and down into (L) leg x1 week. Pt reports symptoms started after he set up in the bed and heard a "poping" noise. Pt denies problems w/bowel or bladder function.

## 2011-10-22 NOTE — ED Notes (Signed)
Pt denies any further questions upon discharge. 

## 2011-10-22 NOTE — ED Provider Notes (Signed)
Medical screening examination/treatment/procedure(s) were performed by non-physician practitioner and as supervising physician I was immediately available for consultation/collaboration.  Chariti Havel K Lonya Johannesen, MD 10/22/11 0410 

## 2011-12-11 ENCOUNTER — Encounter: Payer: Self-pay | Admitting: Family Medicine

## 2011-12-11 ENCOUNTER — Ambulatory Visit (INDEPENDENT_AMBULATORY_CARE_PROVIDER_SITE_OTHER): Payer: PRIVATE HEALTH INSURANCE

## 2011-12-11 ENCOUNTER — Ambulatory Visit (INDEPENDENT_AMBULATORY_CARE_PROVIDER_SITE_OTHER): Payer: PRIVATE HEALTH INSURANCE | Admitting: Family Medicine

## 2011-12-11 VITALS — BP 137/88 | HR 101 | Ht 69.0 in | Wt 219.0 lb

## 2011-12-11 DIAGNOSIS — M25519 Pain in unspecified shoulder: Secondary | ICD-10-CM

## 2011-12-11 DIAGNOSIS — M25512 Pain in left shoulder: Secondary | ICD-10-CM

## 2011-12-11 DIAGNOSIS — M25511 Pain in right shoulder: Secondary | ICD-10-CM

## 2011-12-11 DIAGNOSIS — M51369 Other intervertebral disc degeneration, lumbar region without mention of lumbar back pain or lower extremity pain: Secondary | ICD-10-CM

## 2011-12-11 DIAGNOSIS — I10 Essential (primary) hypertension: Secondary | ICD-10-CM | POA: Insufficient documentation

## 2011-12-11 DIAGNOSIS — M2550 Pain in unspecified joint: Secondary | ICD-10-CM

## 2011-12-11 DIAGNOSIS — N200 Calculus of kidney: Secondary | ICD-10-CM

## 2011-12-11 DIAGNOSIS — G8929 Other chronic pain: Secondary | ICD-10-CM

## 2011-12-11 DIAGNOSIS — R110 Nausea: Secondary | ICD-10-CM

## 2011-12-11 DIAGNOSIS — M5136 Other intervertebral disc degeneration, lumbar region: Secondary | ICD-10-CM

## 2011-12-11 HISTORY — DX: Other intervertebral disc degeneration, lumbar region without mention of lumbar back pain or lower extremity pain: M51.369

## 2011-12-11 HISTORY — DX: Other intervertebral disc degeneration, lumbar region: M51.36

## 2011-12-11 HISTORY — DX: Calculus of kidney: N20.0

## 2011-12-11 HISTORY — DX: Essential (primary) hypertension: I10

## 2011-12-11 MED ORDER — PROMETHAZINE HCL 25 MG PO TABS: 25.0000 mg | ORAL_TABLET | Freq: Four times a day (QID) | ORAL | Status: DC | PRN

## 2011-12-11 NOTE — Progress Notes (Signed)
CC: [name] is a 41 y.o. male is here for Establish Care   Subjective: HPI:  Patient presents to establish care  Acute complaint of chronic right and left shoulder pain, bilateral knee pain, bilateral wrist pain, bilateral lateral pain in the metacarpal phalangeal joints bilaterally and proximal interphalangeal joints bilaterally. This discomfort has waxed and waned for the last years however right and left shoulder pain has become somewhat moderate to severe over the past 4 days. With respect to his other joints is not uncommon for them to swell and become red for unknown reasons without precipitating events. With respect to his shoulder pain is worse when trying to lift above his head, worse when rolling over in the middle the night, and worse when trying to reach his hand to his buttocks. He denies motor or sensory disturbances in either extremity. The pain often radiates to his neck posteriorly. 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 As a child he was told that he had a rheumatologic disease however he and his family never kept records about what this exactly was. He has a family history rheumatoid arthritis.  He denies fevers, chills, unintentional weight loss, swollen lymph nodes, trouble fighting infections, bone fractures with minimal trauma, nor eye pain or vision loss. The pain often gets to the point where he gets nauseous. Percocet helps pain to a degree, ibuprofen provides mild improvement of his pain.  Has a history of degenerative disc disease in his lumbar region, he is currently seeing Dr.David [name] who is a Designer, jewellery the patient's Percocet. He denies motor sensory disturbances and lower studies, bowel or bladder incontinence, nor saddle paresthesia   Review Of Systems Outlined In HPI  Past Medical History  Diagnosis Date  . Pericarditis   . History of lithotripsy   . Rheumatoid arthritis   . Gout   . Hypertension    . Kidney stones   . Renal tubular acidosis   . Anxiety   . Nephrolithiasis 12/11/2011  . Lumbar degenerative disc disease 12/11/2011  . Essential hypertension 12/11/2011     Family History  Problem Relation Age of Onset  . Hypertension Father   . Rheum arthritis Father      History  Substance Use Topics  . Smoking status: Current Every Day Smoker -- 1.0 packs/day  . Smokeless tobacco: Not on file  . Alcohol Use: No     Objective: Filed Vitals:   12/11/11 1518  BP: 137/88  Pulse: 101    General: Alert and Oriented, No Acute Distress HEENT: Pupils equal, round, reactive to light. Conjunctivae clear.    Neck supple without palpable lymphadenopathy nor abnormal masses. Lungs: Clear to auscultation bilaterally, no wheezing/ronchi/rales.  Comfortable work of breathing. Good air movement. Cardiac: Regular rate and rhythm. Normal S1/S2.  No murmurs, rubs, nor gallops.   Right upper extremity: Hawkins positive, neers positive speeds negative, scarf test negative, O'Brien negative, full range of motion strength throughout rotator cuff muscles tested however internal rotation behind the back reproduces pain, no pain on palpation of the a.c. Joint Left upper extremity:Hawkins positive, neers positive speeds negative, scarf test negative, O'Brien negative, full range of motion strength throughout rotator cuff muscles tested however internal rotation behind the back reproduces pain, no pain on palpation of the a.c. Joint Neuro: CN II-XII grossly intact, Spurling's negative, C5-C6 in the bilateral upper extremities 2/4 Extremities: No peripheral edema.  Strong peripheral pulses. Mild/moderate swelling in the majority of  the PIPs Mental Status: No depression, anxiety, nor agitation.   Assessment & Plan: [name] was seen today for establish care.  Diagnoses and associated orders for this visit:  Right shoulder pain - DG Shoulder Right; Future  Left shoulder pain - DG Shoulder Left;  Future  Nausea - promethazine (PHENERGAN) 25 MG tablet; Take 1 tablet (25 mg total) by mouth every 6 (six) hours as needed for nausea.  Chronic pain of multiple joints - Antinuclear Antib (ANA) - C-reactive protein - Rheumatoid Factor - Cyclic citrul peptide antibody, IgG - Sed Rate (ESR)    Presentation story and exam raise suspicions for rheumatoid arthritis, looking into this possibility with the above labs. Will determine followup based on results of above, will call patient early next week once everything is available.  Return f/u will be based on lab/flim results.

## 2011-12-12 LAB — SEDIMENTATION RATE: Sed Rate: 5 mm/hr (ref 0–16)

## 2011-12-12 LAB — RHEUMATOID FACTOR: Rhuematoid fact SerPl-aCnc: 150 IU/mL — ABNORMAL HIGH (ref <=14)

## 2011-12-12 LAB — C-REACTIVE PROTEIN: CRP: 1.6 mg/dL — ABNORMAL HIGH (ref <0.60)

## 2011-12-14 ENCOUNTER — Telehealth: Payer: Self-pay | Admitting: Family Medicine

## 2011-12-14 DIAGNOSIS — G8929 Other chronic pain: Secondary | ICD-10-CM

## 2011-12-14 DIAGNOSIS — R768 Other specified abnormal immunological findings in serum: Secondary | ICD-10-CM

## 2011-12-14 LAB — ANA: Anti Nuclear Antibody(ANA): NEGATIVE

## 2011-12-14 LAB — CYCLIC CITRUL PEPTIDE ANTIBODY, IGG: Cyclic Citrullin Peptide Ab: 102.9 U/mL — ABNORMAL HIGH (ref 0.0–5.0)

## 2011-12-14 NOTE — Telephone Encounter (Signed)
Sue Lush, Will you please let Mr. Wasley know that his xrays do not show any joint destruction nor bony abnormalities.  The blood work suggests that he may have Rheumatoid Arthritis and I'd like for him to discuss his history and these findings with a specialist to determine what long term management might be warranted.  I've placed a referral for this already.  Based on the exam last week, he may have some rotator cuff inflammation that might respond to a steroid injection which I would be happy to deliver in the near future.  Thanks, -Gregary Signs

## 2011-12-14 NOTE — Telephone Encounter (Signed)
Pt notified and will schedule appt with Dr. Ivan Anchors for injection

## 2011-12-17 ENCOUNTER — Encounter: Payer: Self-pay | Admitting: Family Medicine

## 2011-12-17 DIAGNOSIS — M069 Rheumatoid arthritis, unspecified: Secondary | ICD-10-CM | POA: Insufficient documentation

## 2011-12-18 ENCOUNTER — Ambulatory Visit (INDEPENDENT_AMBULATORY_CARE_PROVIDER_SITE_OTHER): Payer: 59 | Admitting: Family Medicine

## 2011-12-18 ENCOUNTER — Encounter: Payer: Self-pay | Admitting: Family Medicine

## 2011-12-18 VITALS — BP 131/95 | HR 75 | Ht 69.0 in | Wt 220.0 lb

## 2011-12-18 DIAGNOSIS — F32A Depression, unspecified: Secondary | ICD-10-CM

## 2011-12-18 DIAGNOSIS — Z13220 Encounter for screening for lipoid disorders: Secondary | ICD-10-CM

## 2011-12-18 DIAGNOSIS — F419 Anxiety disorder, unspecified: Secondary | ICD-10-CM | POA: Insufficient documentation

## 2011-12-18 DIAGNOSIS — I10 Essential (primary) hypertension: Secondary | ICD-10-CM

## 2011-12-18 DIAGNOSIS — F411 Generalized anxiety disorder: Secondary | ICD-10-CM

## 2011-12-18 DIAGNOSIS — M069 Rheumatoid arthritis, unspecified: Secondary | ICD-10-CM

## 2011-12-18 DIAGNOSIS — F329 Major depressive disorder, single episode, unspecified: Secondary | ICD-10-CM

## 2011-12-18 LAB — LIPID PANEL
Cholesterol: 187 mg/dL (ref 0–200)
HDL: 32 mg/dL — ABNORMAL LOW (ref >39)
LDL Cholesterol: 128 mg/dL — ABNORMAL HIGH (ref 0–99)
Total CHOL/HDL Ratio: 5.8 Ratio
Triglycerides: 135 mg/dL (ref <150)
VLDL: 27 mg/dL (ref 0–40)

## 2011-12-18 MED ORDER — CITALOPRAM HYDROBROMIDE 20 MG PO TABS: 20.0000 mg | ORAL_TABLET | Freq: Every day | ORAL | Status: DC

## 2011-12-18 NOTE — Progress Notes (Signed)
CC: [name] is a 41 y.o. male is here for Hypertension and Anxiety   Subjective: HPI:  Patient presents for followup and would like to discuss restlessness and anxiety. This is been present for matter of months. He's had trouble with similar symptoms in the past during the separation from his former wife while the same time there were multiple deaths in his family. Years ago he voluntarily committed himself at [facility] do to a nervous breakdown. Currently he describes a constant anxiousness that he is about to lose his job. He describes restlessness all hours of the day that sometimes interferes with his sleep.  Current stressors include recent diagnosis of rheumatoid arthritis, and the fact that he may need to go on disability.  He has a good support system at home, he is a smoker but denies tobacco or recreational drug use. He has been on BuSpar in the past but didn't seem to help much, he had some improvement with Xanax. He admits he feels somewhat down and depressed over the past few months on a daily basis. He denies paranoia, thoughts of wanting to harm himself or others, hallucinations, mania.  Recently diagnosed with rheumatoid arthritis, he started prednisone yesterday, he's being evaluated for candidacy for methotrexate. He still complains of bilateral shoulder pain, wrist and MCP pain that has been unchanged since his last visit.  He carries a remote diagnosis of essential hypertension. He was on an antihypertensive the name escapes him at this time. He has no outside blood pressures to report. He denies chest pain, shortness of breath, orthopnea. He does occasionally have feet swelling is out for long periods of time. He is unsure of the last time he had a cholesterol check.    Review Of Systems Outlined In HPI  Past Medical History  Diagnosis Date  . Pericarditis   . History of lithotripsy   . Rheumatoid arthritis   . Gout   . Hypertension   . Kidney stones     . Renal tubular acidosis   . Anxiety   . Nephrolithiasis 12/11/2011  . Lumbar degenerative disc disease 12/11/2011  . Essential hypertension 12/11/2011     Family History  Problem Relation Age of Onset  . Hypertension Father   . Rheum arthritis Father      History  Substance Use Topics  . Smoking status: Current Every Day Smoker -- 1.0 packs/day  . Smokeless tobacco: Not on file  . Alcohol Use: No     Objective: Filed Vitals:   12/18/11 0937  BP: 131/95  Pulse: 75    General: Alert and Oriented, No Acute Distress HEENT: Pupils equal, round, reactive to light. Conjunctivae clear.  Moist mucous membranes, pharynx without inflammation nor lesions.  Neck supple without palpable lymphadenopathy nor abnormal masses. Lungs: Clear to auscultation bilaterally, no wheezing/ronchi/rales.  Comfortable work of breathing. Good air movement. Cardiac: Regular rate and rhythm. Normal S1/S2.  No murmurs, rubs, nor gallops.  No carotid bruits Extremities: No peripheral edema.  Strong peripheral pulses.  Mental Status: No depression, anxiety. Appears restless during exam, normal and logical thought process Skin: Warm and dry.  Assessment & Plan: [name] was seen today for hypertension and anxiety.  Diagnoses and associated orders for this visit:  Rheumatoid arthritis  Essential hypertension - Lipid panel  Anxiety - citalopram (CELEXA) 20 MG tablet; Take 1 tablet (20 mg total) by mouth daily. - Ambulatory referral to Psychology  Depression - Ambulatory referral to Psychology  Lipid screening -  Lipid panel  Other Orders - predniSONE (DELTASONE) 10 MG tablet; Take 10 mg by mouth 2 (two) times daily.    Anxiety: He is open to starting Celexa, I would like to see its SSRI/SNRIs regimen we'll give him benefit before restarting old Xanax regimen. Return in 4 weeks for assessment of response. His wife sees counseling down stairs and he has agreed to see a counselor himself, he  specifically"once someone to talk to" Essential hypertension: Pre-hypertension values at last 2 visits, I discussed with him we'll hold blood pressure medication until he gets to the hypertensive state. Discussed lifestyle interventions. He is due for cholesterol screening will do that today Rheumatoid arthritis: He has followup next week  Return in about 4 weeks (around 01/15/2012).

## 2011-12-21 ENCOUNTER — Encounter: Payer: Self-pay | Admitting: Family Medicine

## 2011-12-21 DIAGNOSIS — E782 Mixed hyperlipidemia: Secondary | ICD-10-CM | POA: Insufficient documentation

## 2011-12-21 DIAGNOSIS — E785 Hyperlipidemia, unspecified: Secondary | ICD-10-CM | POA: Insufficient documentation

## 2011-12-23 LAB — CBC AND DIFFERENTIAL
Hemoglobin: 16 g/dL (ref 13.5–17.5)
Platelets: 424 10*3/uL — AB (ref 150–399)

## 2011-12-23 LAB — HEPATIC FUNCTION PANEL
ALT: 31 U/L (ref 10–40)
AST: 20 U/L (ref 14–40)

## 2011-12-23 LAB — BASIC METABOLIC PANEL: Creatinine: 0.9 mg/dL (ref <=1.3)

## 2011-12-28 ENCOUNTER — Encounter: Payer: Self-pay | Admitting: Family Medicine

## 2011-12-28 DIAGNOSIS — M5030 Other cervical disc degeneration, unspecified cervical region: Secondary | ICD-10-CM

## 2012-01-15 ENCOUNTER — Encounter: Payer: Self-pay | Admitting: Family Medicine

## 2012-01-15 ENCOUNTER — Ambulatory Visit (INDEPENDENT_AMBULATORY_CARE_PROVIDER_SITE_OTHER): Payer: 59 | Admitting: Family Medicine

## 2012-01-15 VITALS — BP 148/101 | HR 88 | Wt 224.0 lb

## 2012-01-15 DIAGNOSIS — F419 Anxiety disorder, unspecified: Secondary | ICD-10-CM

## 2012-01-15 DIAGNOSIS — F411 Generalized anxiety disorder: Secondary | ICD-10-CM

## 2012-01-15 DIAGNOSIS — I10 Essential (primary) hypertension: Secondary | ICD-10-CM

## 2012-01-15 DIAGNOSIS — E785 Hyperlipidemia, unspecified: Secondary | ICD-10-CM

## 2012-01-15 MED ORDER — CLONAZEPAM 1 MG PO TABS: ORAL_TABLET | ORAL | Status: DC

## 2012-01-15 MED ORDER — HYDROCHLOROTHIAZIDE 25 MG PO TABS: 25.0000 mg | ORAL_TABLET | Freq: Every day | ORAL | Status: DC

## 2012-01-15 NOTE — Progress Notes (Signed)
CC: [name] is a 41 y.o. male is here for Anxiety   Subjective: HPI: Patient presents followup of anxiety. He believes it's not improving. He was started on Celexa at his last visit has noticed no improvement with nervousness, restlessness, and "anxiety ". Symptoms seem to be worse since starting prednisone. When his anxiety increases he notices a posterior headache. He denies depression, paranoia, mental disturbance, hallucinations. Denies substance or alcohol use. Anxiety seems to be getting worse do to father's recent cardiac procedure and patient's upcoming neck surgery.  And multiple visits the patient's blood pressure has been elevated. He's not currently taking antihypertensives. He has no outside blood pressures to report. He's unsure as to what makes his blood pressure better or worse. I've noted he got worse since starting prednisone. He denies motor sensory disturbances, headaches, chest pain, shortness of breath, irregular heartbeat, peripheral edema.  Hyperlipidemia: Patient did not receive lab results from his last visit. He is a Framingham risk of 9%, : Goal LDL less than 130 which he has met. He denies limb claudication or rest pain. Tries to watch what he eats, no formal exercise program do to return arthritis joint pain.     Review Of Systems Outlined In HPI  Past Medical History  Diagnosis Date  . Pericarditis   . History of lithotripsy   . Rheumatoid arthritis   . Gout   . Hypertension   . Kidney stones   . Renal tubular acidosis   . Anxiety   . Nephrolithiasis 12/11/2011  . Lumbar degenerative disc disease 12/11/2011  . Essential hypertension 12/11/2011     Family History  Problem Relation Age of Onset  . Hypertension Father   . Rheum arthritis Father      History  Substance Use Topics  . Smoking status: Current Every Day Smoker -- 1.0 packs/day  . Smokeless tobacco: Not on file  . Alcohol Use: No     Objective: Filed Vitals:   01/15/12 1100   BP: 148/101  Pulse: 88    General: Alert and Oriented, No Acute Distress HEENT: Pupils equal, round, reactive to light. Conjunctivae clear.  External ears unremarkable, canals clear with intact TMs with appropriate landmarks.  Middle ear appears open without effusion. Pink inferior turbinates.  Moist mucous membranes, pharynx without inflammation nor lesions.  Neck supple without palpable lymphadenopathy nor abnormal masses. Lungs: Clear to auscultation bilaterally, no wheezing/ronchi/rales.  Comfortable work of breathing. Good air movement. Cardiac: Regular rate and rhythm. Normal S1/S2.  No murmurs, rubs, nor gallops.   Extremities: No peripheral edema.  Strong peripheral pulses.  Mental Status: No depression,  appears somewhat anxious and restless  Skin: Warm and dry.  Assessment & Plan: Alexx was seen today for anxiety.  Diagnoses and associated orders for this visit:  Anxiety - clonazePAM (KLONOPIN) 1 MG tablet; Two times a day and right before bed only as needed for anxiety.  Essential hypertension - hydrochlorothiazide (HYDRODIURIL) 25 MG tablet; Take 1 tablet (25 mg total) by mouth daily.  Hyperlipidemia ldl goal <130    Anxiety: Uncontrolled, he's done well on Xanax in the past he is open to starting Klonopin on a when necessary basis continuing Celexa Hypertension: Uncontrolled start hydrochlorothiazide Hyperlipidemia: Controlled we'll monitor annually, he's not interested in starting cholesterol medications.  He's recently started methotrexate, still taking prednisone  Return in about 4 weeks (around 02/12/2012).

## 2012-02-09 LAB — BASIC METABOLIC PANEL
Creatinine: 1.1 mg/dL (ref <=1.3)
Glucose: 116 mg/dL
Potassium: 4.5 mmol/L (ref 3.4–5.3)
Sodium: 137 mmol/L (ref 137–147)

## 2012-02-09 LAB — HEPATIC FUNCTION PANEL
ALT: 51 U/L — AB (ref 10–40)
AST: 21 U/L (ref 14–40)

## 2012-02-10 LAB — BASIC METABOLIC PANEL
Creatinine: 1.1 mg/dL (ref 0.6–1.3)
Glucose: 116 mg/dL

## 2012-02-10 LAB — HEPATIC FUNCTION PANEL
ALT: 21 U/L (ref 10–40)
AST: 51 U/L — AB (ref 14–40)

## 2012-02-12 ENCOUNTER — Ambulatory Visit (INDEPENDENT_AMBULATORY_CARE_PROVIDER_SITE_OTHER): Payer: 59 | Admitting: Family Medicine

## 2012-02-12 ENCOUNTER — Encounter: Payer: Self-pay | Admitting: Family Medicine

## 2012-02-12 VITALS — BP 143/93 | HR 80 | Wt 219.0 lb

## 2012-02-12 DIAGNOSIS — G2581 Restless legs syndrome: Secondary | ICD-10-CM | POA: Insufficient documentation

## 2012-02-12 DIAGNOSIS — F419 Anxiety disorder, unspecified: Secondary | ICD-10-CM

## 2012-02-12 DIAGNOSIS — G43909 Migraine, unspecified, not intractable, without status migrainosus: Secondary | ICD-10-CM

## 2012-02-12 DIAGNOSIS — I10 Essential (primary) hypertension: Secondary | ICD-10-CM

## 2012-02-12 DIAGNOSIS — F411 Generalized anxiety disorder: Secondary | ICD-10-CM

## 2012-02-12 DIAGNOSIS — Z23 Encounter for immunization: Secondary | ICD-10-CM

## 2012-02-12 LAB — IRON AND TIBC
%SAT: 24 % (ref 20–55)
Iron: 87 ug/dL (ref 42–165)
TIBC: 364 ug/dL (ref 215–435)
UIBC: 277 ug/dL (ref 125–400)

## 2012-02-12 MED ORDER — FROVATRIPTAN SUCCINATE 2.5 MG PO TABS: 2.5000 mg | ORAL_TABLET | ORAL | Status: DC | PRN

## 2012-02-12 MED ORDER — CITALOPRAM HYDROBROMIDE 20 MG PO TABS: 20.0000 mg | ORAL_TABLET | Freq: Every day | ORAL | Status: DC

## 2012-02-12 NOTE — Progress Notes (Addendum)
CC: [name] is a 42 y.o. male is here for Hypertension and Anxiety   Subjective: HPI:  Essential hypertension: No outside blood pressures to report. Denies missed doses of hydrochlorothiazide 25 mg which was started at last visit. Denies chest pain, shortness of breath, irregular heartbeat, orthopnea, peripheral edema, PND nor motor or sensory disturbances  Anxiety: Started on Klonopin at his last visit. He notices 1 mg if taken during the day causes slurred speech and causes him to be more emotional than normal. He has been taking half a tablet in the morning and he full tablet in the evening for the past week and reports significant improvement in restfulness after sleep and staying asleep. Anxiety in the daytime with a half tablet is only mildly improved. He and his significant other have much to talk about with respect to the patient feeling guilty about a libido but is quite greater than that at his significant other. The patient's 2 former wives sounds to have greatly damaged the patient's self-esteem. Patient is moderately to severely anxious that his current significant other may leave him in the near or distant future however the 2 of them can comfortably say that they recognize that this would never happen. Patient reports that these unfound feelings bother him on a daily basis and is worried that they may with one-day getting between their current relationship. The patient and his significant other expressed 100% commitment and devotion to each other.  Restless leg: He reports months of a sensation that he constantly has to move his legs. This is worse when trying to go to bed. It is improved with walking or movement. It is moderate in severity. There are no interventions as of yet. Nothing seems to make it better or worse. Happens a daily basis and can occur any hour of the day. Migraine: Ever since spinal fusion earlier this month he describes a headache over the right temple that  is pounding associated with photophobia and phonophobia, and mild nausea. No other motor or sensory disturbances before after or during. Occurs on a daily basis and lasts greater than 30 minutes but no more than 5 hours. Denies that it is the worst headache in his life. Rest helps greatly but nothing else makes better or worse. Symptoms are identical to headaches that he had decades ago thought to be migraines   Review Of Systems Outlined In HPI  Past Medical History  Diagnosis Date  . Pericarditis   . History of lithotripsy   . Rheumatoid arthritis   . Gout   . Hypertension   . Kidney stones   . Renal tubular acidosis   . Anxiety   . Nephrolithiasis 12/11/2011  . Lumbar degenerative disc disease 12/11/2011  . Essential hypertension 12/11/2011     Family History  Problem Relation Age of Onset  . Hypertension Father   . Rheum arthritis Father      History  Substance Use Topics  . Smoking status: Current Every Day Smoker -- 1.0 packs/day  . Smokeless tobacco: Not on file  . Alcohol Use: No     Objective: Filed Vitals:   02/12/12 0951  BP: 143/93  Pulse: 80    General: Alert and Oriented, No Acute Distress HEENT: Pupils equal, round, reactive to light. Conjunctivae clear.  .  Moist mucous membranes, Neck supple without palpable lymphadenopathy nor abnormal masses. Lungs: Clear to auscultation bilaterally, no wheezing/ronchi/rales.  Comfortable work of breathing. Good air movement. Cardiac: Regular rate and rhythm. Normal S1/S2.  No murmurs, rubs, nor gallops.   Abdomen: Soft nontender Extremities: No peripheral edema.  Strong peripheral pulses.  Mental Status: Occasional tearfulness, mild restlessness, normal thought process, well dressed, good eye contact, pleasant Skin: Warm and dry.  Assessment & Plan: Delores was seen today for hypertension and anxiety.  Diagnoses and associated orders for this visit:  Need for prophylactic vaccination and inoculation against  influenza - Flu vaccine greater than or equal to 3yo preservative free IM  Essential hypertension  Anxiety - Ambulatory referral to Psychology - citalopram (CELEXA) 20 MG tablet; Take 1 tablet (20 mg total) by mouth daily.  Restless leg - Iron - Iron Binding Cap (TIBC)  Migraine    Hypertension: Uncontrolled, continue hydrochlorothiazide, he would like to focus on dietary interventions before going up on this medication Anxiety: Uncontrolled, he'll continue with 0.5 mg of Klonopin in the morning and 1 mg in the evening. He and his significant other agreed that he would get more benefit from counseling rather than changing Celexa at this time, referral placed Restless leg: Will rule out iron deficiency Migraine: Uncontrolled, symptoms meet criteria for typical migraine. Samples of Frova provided.  45 minutes spent face-to-face during visit today of which at least 50% was counseling or coordinating care regarding migraine: Anxiety: Restless leg: Essential hypertension.  Return in about 4 weeks (around 03/11/2012).

## 2012-02-15 ENCOUNTER — Telehealth: Payer: Self-pay | Admitting: Family Medicine

## 2012-02-15 DIAGNOSIS — G2581 Restless legs syndrome: Secondary | ICD-10-CM

## 2012-02-15 MED ORDER — GABAPENTIN 300 MG PO CAPS: 300.0000 mg | ORAL_CAPSULE | Freq: Every day | ORAL | Status: DC

## 2012-02-15 NOTE — Telephone Encounter (Signed)
Left detailed message on pt's vm and to call back with any concerns

## 2012-02-15 NOTE — Telephone Encounter (Signed)
Sue Lush, Will you please let [name] know that his iron level was normal and is not likely contributing to his restless legs.  I've set in a Rx for a bedtime gabapentin regimen that might help some.  This medication is not sedating so I think it would be safe for him to start it at his convienece.  If he has reservations he can wait until he comes back in Feb to discuss the new Rx.

## 2012-03-01 ENCOUNTER — Ambulatory Visit (INDEPENDENT_AMBULATORY_CARE_PROVIDER_SITE_OTHER): Payer: 59 | Admitting: Behavioral Health

## 2012-03-01 ENCOUNTER — Encounter (HOSPITAL_COMMUNITY): Payer: Self-pay | Admitting: Behavioral Health

## 2012-03-01 DIAGNOSIS — F064 Anxiety disorder due to known physiological condition: Secondary | ICD-10-CM

## 2012-03-01 DIAGNOSIS — F331 Major depressive disorder, recurrent, moderate: Secondary | ICD-10-CM

## 2012-03-01 DIAGNOSIS — F411 Generalized anxiety disorder: Secondary | ICD-10-CM
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
## 2012-03-04 ENCOUNTER — Ambulatory Visit (INDEPENDENT_AMBULATORY_CARE_PROVIDER_SITE_OTHER): Payer: 59 | Admitting: Family Medicine

## 2012-03-04 ENCOUNTER — Encounter: Payer: Self-pay | Admitting: Family Medicine

## 2012-03-04 VITALS — BP 137/96 | HR 73 | Wt 222.0 lb

## 2012-03-04 DIAGNOSIS — I10 Essential (primary) hypertension: Secondary | ICD-10-CM

## 2012-03-04 DIAGNOSIS — F411 Generalized anxiety disorder: Secondary | ICD-10-CM

## 2012-03-04 DIAGNOSIS — F17200 Nicotine dependence, unspecified, uncomplicated: Secondary | ICD-10-CM

## 2012-03-04 DIAGNOSIS — F419 Anxiety disorder, unspecified: Secondary | ICD-10-CM

## 2012-03-04 DIAGNOSIS — Z720 Tobacco use: Secondary | ICD-10-CM

## 2012-03-04 DIAGNOSIS — G2581 Restless legs syndrome: Secondary | ICD-10-CM

## 2012-03-04 MED ORDER — HYDROCHLOROTHIAZIDE 50 MG PO TABS: 50.0000 mg | ORAL_TABLET | Freq: Every day | ORAL | Status: DC

## 2012-03-04 MED ORDER — CLONAZEPAM 0.5 MG PO TABS: ORAL_TABLET | ORAL | Status: DC

## 2012-03-04 NOTE — Progress Notes (Signed)
CC: [name] is a 42 y.o. male is here for Hypertension and Anxiety   Subjective: HPI:  Followup hypertension: Patient has been off of hydrochlorothiazide for one week due to running out of medication. No outside blood pressures to report. Denies chest pain, shortness of breath, motor or sensory disturbances, confusion, headaches, orthopnea, peripheral edema, nor irregular heartbeat  Followup anxiety: Has established care with behavioral health downstairs. He reports Klonopin regimen is making him sleepy but has significantly helped with anxiety throughout the day. Denies paranoia nor mental disturbance nor worsening of anxiety.    Followup restless leg: Reports continued sensation of needing to move legs while sleeping. Started gabapentin without much improvement. Denies cramping of the legs nor limb claudication nor limb rest pain. Denies new pain in joints of the leg nor swelling redness or warmth in the joints of the legs.  Tobacco use. Patient expresses a large desire to stop smoking. He is currently using electronic cigarettes but asks about other methods to help cut down or stop smoking completely. He cannot room or the last time he tried to stop smoking. Smoking about a pack a day sometimes a little more  Review Of Systems Outlined In HPI  Past Medical History  Diagnosis Date  . Pericarditis   . History of lithotripsy   . Rheumatoid arthritis   . Gout   . Hypertension   . Kidney stones   . Renal tubular acidosis   . Anxiety   . Nephrolithiasis 12/11/2011  . Lumbar degenerative disc disease 12/11/2011  . Essential hypertension 12/11/2011  . Arthritis      Family History  Problem Relation Age of Onset  . Hypertension Father   . Rheum arthritis Father      History  Substance Use Topics  . Smoking status: Current Every Day Smoker -- 1.0 packs/day  . Smokeless tobacco: Not on file  . Alcohol Use: No     Objective: Filed Vitals:   03/04/12 1049  BP: 137/96   Pulse: 73    General: Alert and Oriented, No Acute Distress HEENT: Pupils equal, round, reactive to light. Conjunctivae clear.   Moist mucous membranes, pharynx without inflammation nor lesions.  Neck supple without palpable lymphadenopathy nor abnormal masses. Lungs: Clear to auscultation bilaterally, no wheezing/ronchi/rales.  Comfortable work of breathing. Good air movement. Cardiac: Regular rate and rhythm. Normal S1/S2.  No murmurs, rubs, nor gallops.   Abdomen: Soft and nontender Extremities: No peripheral edema.  Strong peripheral pulses.  Mental Status: No depression, anxiety, nor agitation. Skin: Warm and dry.  Assessment & Plan: [name] was seen today for hypertension and anxiety.  Diagnoses and associated orders for this visit:  Essential hypertension - hydrochlorothiazide (HYDRODIURIL) 50 MG tablet; Take 1 tablet (50 mg total) by mouth daily.  Restless leg  Anxiety - clonazePAM (KLONOPIN) 0.5 MG tablet; Half to full tab twice a day only as needed.  Tobacco use    Essential hypertension: Uncontrolled, Refill or thiazide however increase dose to 50 mg as he was in stage I hypertension at multiple visits even while on 25 mg Restless leg: Uncontrolled, encouraged him to self titrate up to 900 mg of gabapentin at bedtime over the next 2 weeks. Anxiety: Uncontrolled, we anticipate medications may be adjusted by behavioral health in the near future, he is open to the idea of cutting back on clonazepam to help minimize sedative side effect Tobacco use: 10 minutes spent discussing patches, medications, gum, and other nicotine replacement methods. Stressed the importance  of choosing a quit date and avoiding triggers or other people who are smokers during quitting process. Patient was given a sample of Nicotrol.  30 minutes spent face-to-face during visit today of which at least 50% was counseling or coordinating care regarding tobacco cessation, anxiety, restless leg, essential  hypertension.   Return in about 1 month (around 04/01/2012).

## 2012-03-07 ENCOUNTER — Ambulatory Visit: Payer: Self-pay | Admitting: Physical Therapy

## 2012-03-14 ENCOUNTER — Ambulatory Visit: Payer: 59 | Attending: Neurological Surgery | Admitting: Physical Therapy

## 2012-03-14 DIAGNOSIS — M545 Low back pain, unspecified: Secondary | ICD-10-CM | POA: Insufficient documentation

## 2012-03-14 DIAGNOSIS — M2569 Stiffness of other specified joint, not elsewhere classified: Secondary | ICD-10-CM | POA: Insufficient documentation

## 2012-03-14 DIAGNOSIS — IMO0001 Reserved for inherently not codable concepts without codable children: Secondary | ICD-10-CM | POA: Insufficient documentation

## 2012-03-14 DIAGNOSIS — R5381 Other malaise: Secondary | ICD-10-CM | POA: Insufficient documentation

## 2012-03-15 ENCOUNTER — Encounter (HOSPITAL_COMMUNITY): Payer: Self-pay | Admitting: Behavioral Health

## 2012-03-15 ENCOUNTER — Ambulatory Visit (INDEPENDENT_AMBULATORY_CARE_PROVIDER_SITE_OTHER): Payer: 59 | Admitting: Behavioral Health

## 2012-03-15 DIAGNOSIS — F4323 Adjustment disorder with mixed anxiety and depressed mood: Secondary | ICD-10-CM

## 2012-03-15 NOTE — Progress Notes (Signed)
THERAPIST PROGRESS NOTE  Session Time: 10:00  Participation Level: Active  Behavioral Response: CasualAlertAnxious/depressed  Type of Therapy: Individual Therapy  Treatment Goals addressed: Coping  Interventions: CBT  Summary: [name] is a 42 y.o. male who presents with anxiety and depression.   Suicidal/Homicidal: Nowithout intent/plan  Therapist Response: The client told me that the past week and a difficult physically. He indicated that because of arthritis. shoulders he is not sleeping well. He indicated that he stumbled going down the steps of his house and scraped his arm and leg. He reported that he had been to his therapist and that he ten placed on emotional some relief and that he is doing some sort of stretching next week to attempt to relieve the lower back and leg pain. He indicated that his Dr. told him that those did not work they would have to consider surgery because of a bulging disc in the lumbar region of his spine. The client is not opposed to surgery or not necessarily anxious because he knows that he can bring some pain relief. He stated that his cervical disc surgery he was pain-free in terms of the disc issues her arthritis becomes and increasing issue. He indicated that the rheumatologist working with him says that she can help relieve some of the pain with the joints that are still good but did joints that are bad she can only attempt to manage the pain. The client is carrying significant anxiety in terms of the possibility of going back to work. He is on short-term disability to April 14 and there is a possibility that could be extended period we talked about compartmentalizing areas of his life and focusing on getting better and not looking down about it April 14. We did talk about the possibility of long-term disability I told him is what his physical health issues as well as anxiety and depression that he had a good opportunity for long-term disability that  he had worked 26 years and would work now if he felt he was physically able. He did say that he could attempt to go back physically and even though he knew a be difficult it was more the stress of the environment that he works him. He indicates that he has been passed over place to be moved to for shift and does not understand why even though his lead team manager tells me is the best employee that he has a wants him to training other employees which in turn have been moved to first shift. He does indicate that his girlfriend is supportive and that they share financial responsibilities. 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 We talked about coping skills such as breathing and progressive muscle relaxation as well as attempting to get some minimal exercise which he enjoys it would relieve some stress. He does say that he has suicidal thoughts but has no plan of acting on them. He says that he is attempting to commit suicide on 2 different occasions by shooting himself or hanging himself but was not successful. He contracted for safety saying he would let his girlfriend or me know if he were suicidal or call the crisis hotline or 911. He does not have an active suicidal plan.  Plan: Return again  in 1 weeks.  Diagnosis: Axis I: 309.28    Axis II: Deferred    French Ana, [facility] 03/15/2012

## 2012-03-16 ENCOUNTER — Encounter: Payer: Self-pay | Admitting: *Deleted

## 2012-03-16 ENCOUNTER — Encounter: Payer: Self-pay | Admitting: Family Medicine

## 2012-03-21 ENCOUNTER — Ambulatory Visit: Payer: 59 | Admitting: *Deleted

## 2012-03-22 ENCOUNTER — Ambulatory Visit (INDEPENDENT_AMBULATORY_CARE_PROVIDER_SITE_OTHER): Payer: 59 | Admitting: Behavioral Health

## 2012-03-22 ENCOUNTER — Encounter (HOSPITAL_COMMUNITY): Payer: Self-pay | Admitting: Behavioral Health

## 2012-03-22 DIAGNOSIS — F331 Major depressive disorder, recurrent, moderate: Secondary | ICD-10-CM

## 2012-03-22 DIAGNOSIS — F411 Generalized anxiety disorder: Secondary | ICD-10-CM

## 2012-03-22 NOTE — Progress Notes (Signed)
   THERAPIST PROGRESS NOTE  Session Time: 2:00  Participation Level: Active  Behavioral Response: CasualAlertAnxious  Type of Therapy: Individual Therapy  Treatment Goals addressed: Coping  Interventions: CBT  Summary: [name] is a 42 y.o. male who presents with anxiety and depression.   Suicidal/Homicidal: Nowithout intent/plan  Therapist Response: The client continues to present with anxiety and depression in part related to physical health issues. He stated that the tens unit does not appear to be making a significant difference. He does believe the pain some but indicates as soon as he is standing again his leg hurts down into his foot. He stated that his doctors are sinus surgery may be a possibility. He returns to one of his doctors on February 14 which is the day his disability runs out. His biggest concern now is returning to work. He states that physically he is unsure that he can do the job but his anxiety level increases significantly thinks about going to work. He stated that he spoke to a Solicitor and social worker she said that he would be let go because he missed 6 months of work even if he had medical doctor's notes. He said that he spoke to a coworker who told him that even if he came back to work they would find a reason after a few days reason a few weeks to let him go even if he was doing his job correctly. We talked about replacing is negative thoughts and blocking is negative thoughts using cognitive behavioral therapy. Talked about various scenarios which included him going back to work, not going back to work, losing his job, gaining additional short-term disability, back surgery and even long term disability. I encouraged him to only speak to an attorney to did not charge him for not telling him to situation to see what they recommended about applying for long-term disability. He stated that 2 of his medical doctors had arty advise him to look into long-term  disability based on past medical issues, current medical issues and possible ongoing issues related to rheumatoid arthritis. He did say he is having some suicidal thoughts feeling that he is going to" crack.". He did say that he attempted suicide in the past on 2 occasions but that he would let his girlfriend know if he were actively suicidal, call 911 or the suicide crisis line. He did contract for safety saying he would not hurt himself or kill himself before next session. We also reviewed coping skills that worked for him including playing guitar, reading, crocheting, walking the [place], spending time with his girlfriend. We talked about the fact that he did not need to measure 2 years as a man by the fact that he could not work right now because he has worked hard for 30+ years and would be okay with disability. The client did contract for safety saying he will not hurt himself between now and next session he will call 911 or the crisis line or speak to his girlfriend if he feels actively suicidal.  Plan: Return again in 2 weeks.  Diagnosis: Axis I: 300.02/296.32    Axis II: Deferred    French Ana, [facility] 03/22/2012

## 2012-03-30 ENCOUNTER — Ambulatory Visit (INDEPENDENT_AMBULATORY_CARE_PROVIDER_SITE_OTHER): Payer: 59 | Admitting: Behavioral Health

## 2012-03-30 ENCOUNTER — Encounter (HOSPITAL_COMMUNITY): Payer: Self-pay | Admitting: Behavioral Health

## 2012-03-30 DIAGNOSIS — F411 Generalized anxiety disorder: Secondary | ICD-10-CM

## 2012-03-30 DIAGNOSIS — F339 Major depressive disorder, recurrent, unspecified: Secondary | ICD-10-CM

## 2012-03-30 NOTE — Progress Notes (Signed)
   THERAPIST PROGRESS NOTE  Session Time: 2:00  Participation Level: Active  Behavioral Response: CasualAlertDepressed  Type of Therapy: Individual Therapy  Treatment Goals addressed: Coping  Interventions: CBT  Summary: [name] is a 42 y.o. male who presents with depression.   Suicidal/Homicidal: Nowithout intent/plan  Therapist Response: The client indicated that it has been a difficult week for him physically. He indicated that the 10 unit had not helped any physical therapy although brings relief for a few hours eventually makes him feel worse physically. He indicates that he is not sleeping well do to the pain in the shoulders and in particular in his legs. He indicates that they're very few positions which he can comfortable and feels as if his legs are burning. He states that since starting physical therapy and feels that there is pain running down both legs. He indicated that he feels the neck supple be surgery he is now scheduled to see Dr. Yetta Barre until April 14 and is going to see if he can see him sooner. He will see his rheumatologist next week. He feels like increase in dosage has helped some of the arthritis pain looks forward to seeing what she may have for him next week. He indicated that his relationship with his girlfriend has caused him additional stress. He indicates that he feels matter what he does he cannot please her saying that she is very particular in the way that she will things done. He indicates that he helps out around the house but does not feel like it's never good enough. He also stated that his sex drive is significantly stronger than his girlfriend's. He indicates that she asked him not to ask for sex anymore so he stopped asking and even when he attempts to touch her she tells him that it is not going to happen. He is giving some thought to whether or not the relationship is worth continuing. He appreciates the fact that she has been supportive of all of  his physical illnesses and surgeries feels that he is not getting anything physically and/or emotionally out of the relationship. He does recognize that the girlfriend has her issues is working through them with a therapist but is unsure now was best for the relationship. The client does contract for safety saying although he has had suicidal thoughts he has no active plan and would not kill himself because he knows that his children need him.  Plan: Return again in 2 weeks.  Diagnosis: Axis I: 296.3/300.02    Axis II: Deferred    French Ana, [facility] 03/30/2012

## 2012-04-01 ENCOUNTER — Ambulatory Visit: Payer: Self-pay | Admitting: Family Medicine

## 2012-04-19 ENCOUNTER — Ambulatory Visit: Payer: Self-pay

## 2012-04-19 ENCOUNTER — Ambulatory Visit (INDEPENDENT_AMBULATORY_CARE_PROVIDER_SITE_OTHER): Payer: 59 | Admitting: Family Medicine

## 2012-04-19 ENCOUNTER — Encounter: Payer: Self-pay | Admitting: Family Medicine

## 2012-04-19 ENCOUNTER — Ambulatory Visit (INDEPENDENT_AMBULATORY_CARE_PROVIDER_SITE_OTHER): Payer: 59

## 2012-04-19 ENCOUNTER — Ambulatory Visit (HOSPITAL_COMMUNITY): Payer: Self-pay | Admitting: Behavioral Health

## 2012-04-19 ENCOUNTER — Telehealth: Payer: Self-pay | Admitting: Family Medicine

## 2012-04-19 VITALS — BP 136/93 | HR 79 | Ht 69.0 in | Wt 232.0 lb

## 2012-04-19 DIAGNOSIS — I10 Essential (primary) hypertension: Secondary | ICD-10-CM

## 2012-04-19 DIAGNOSIS — M25532 Pain in left wrist: Secondary | ICD-10-CM

## 2012-04-19 DIAGNOSIS — L723 Sebaceous cyst: Secondary | ICD-10-CM

## 2012-04-19 DIAGNOSIS — G2581 Restless legs syndrome: Secondary | ICD-10-CM

## 2012-04-19 DIAGNOSIS — M25539 Pain in unspecified wrist: Secondary | ICD-10-CM

## 2012-04-19 DIAGNOSIS — M79632 Pain in left forearm: Secondary | ICD-10-CM

## 2012-04-19 DIAGNOSIS — W19XXXA Unspecified fall, initial encounter: Secondary | ICD-10-CM

## 2012-04-19 DIAGNOSIS — M79609 Pain in unspecified limb: Secondary | ICD-10-CM

## 2012-04-19 DIAGNOSIS — L089 Local infection of the skin and subcutaneous tissue, unspecified: Secondary | ICD-10-CM

## 2012-04-19 MED ORDER — PRAMIPEXOLE DIHYDROCHLORIDE 0.125 MG PO TABS: ORAL_TABLET | ORAL | Status: DC

## 2012-04-19 MED ORDER — SULFAMETHOXAZOLE-TRIMETHOPRIM 800-160 MG PO TABS: ORAL_TABLET | ORAL | Status: DC

## 2012-04-19 NOTE — Telephone Encounter (Signed)
Pt notified and voiced understanding 

## 2012-04-19 NOTE — Telephone Encounter (Addendum)
Sue Lush, Will you please let Mr. Marchuk or his significant other know that his xrays did not show any fractures which is good news.  I went over his xrays with our sports medicine specialist here who reccommended that Mr. Valenti wear a wrist brace on a daily basis for the next two weeks and if still in pain to come see him.  Mr. Kiel could come in to get fitted with one we have in stock as a nurse visit, or could pick up a "short arm wrist splint" at a local pharmacy.  [facility] will be calling him about an appointment to remove the infected cyst, bactrim, an antibioitc was sent to [facility] after his visit today.

## 2012-04-19 NOTE — Progress Notes (Signed)
CC: [name] is a 42 y.o. male is here for Hypertension   Subjective: HPI:  Patient presents for followup of essential hypertension: No outside blood pressures to report. Has been taking hydrochlorothiazide 50 mg a day. Denies headaches, motor sensory disturbances other than restless leg issues below. Denies chest pain, shortness of breath, orthopnea, peripheral edema, nor muscle cramping  Restless leg: Has been taking up to 900 mg of gabapentin every night. Denies any improvement of his symptoms. Complains of persistent irritability and a sense of needing to move his legs but is only present with rest essentially mostly at night. Symptoms are slightly relieved with movement of legs. Denies limb claudication.  Patient reports left wrist and left forearm pain ever since falling on ice with an extended wrist one half weeks ago. Discomfort is described only as a pain, localized to the distal radius and ulna with radiation peripherally to the fourth and fifth metacarpals and also proximally through the radius and ulna. There is mild numbness of the fourth and fifth digit is slightly improving since onset of pain. He describes his hand was stuck in extended position after the fall and he may have had to relocate it.  Pain is currently moderate and becomes severe with wrist extension or flexion.  Swelling is slightly improved at the wrist. Interventions include only icing. He admits to weakness in extension and flexion of the wrist and additionally at the fourth and fifth digit  Patient reports swelling and discharge at the base of the right scrotum. This is been present for 3 months. It has waxed and waned during that time. It is always tender, discharge is easily expressed with squeezing the lesion. The slightly improves the pain. He's never had an abscess before. He denies genitourinary complaints, fevers, chills, nausea, nor unintentional weight loss   Review Of Systems Outlined In HPI  Past  Medical History  Diagnosis Date  . Pericarditis   . History of lithotripsy   . Rheumatoid arthritis   . Gout   . Hypertension   . Kidney stones   . Renal tubular acidosis   . Anxiety   . Nephrolithiasis 12/11/2011  . Lumbar degenerative disc disease 12/11/2011  . Essential hypertension 12/11/2011  . Arthritis      Family History  Problem Relation Age of Onset  . Hypertension Father   . Rheum arthritis Father      History  Substance Use Topics  . Smoking status: Current Every Day Smoker -- 1.00 packs/day  . Smokeless tobacco: Not on file  . Alcohol Use: No     Objective: Filed Vitals:   04/19/12 1033  BP: 136/93  Pulse: 79    General: Alert and Oriented, No Acute Distress HEENT: Pupils equal, round, reactive to light. Conjunctivae clear.  Moist mucous membranes Lungs: Clear to auscultation bilaterally, no wheezing/ronchi/rales.  Comfortable work of breathing. Good air movement. Cardiac: Regular rate and rhythm. Normal S1/S2.  No murmurs, rubs, nor gallops.   Abdomen: Soft nontender to palpation Extremities:   Strong peripheral pulses. Light touch sensation compromised at finger pad of left fourth digit otherwise intact. Patient is unable to extend wrist beyond 0 Norflex beyond 45. Passive movement beyond this significantly exacerbates pain. No pain in anatomical snuff box, negative Finklestein. Palpation ulnar styloid reproduces pain. Grip strength 4/5 in the left fourth and fifth digit. There is mild swelling without erythema  from the wrist to the midforearm. Mental Status: No depression, anxiety, nor agitation. Skin: Warm and dry.  At the right base of the scrotum there is approximately 1 cm nodule that is tender to the touch and with palpation expresses bloody/transudate-like fluid a single poor. Bedside ultrasound of this lesion shows sharply circumscribed hypoechoic lesion.  Assessment & Plan: [name] was seen today for hypertension.  Diagnoses and associated  orders for this visit:  Essential hypertension  Restless leg - pramipexole (MIRAPEX) 0.125 MG tablet; One to two tablets every evening before bed for restless legs.  Left wrist pain - DG Wrist Complete Left; Future  Left forearm pain - DG Forearm Left; Future  Infected sebaceous cyst of skin - Ambulatory referral to Urology  Other Orders - sulfamethoxazole-trimethoprim (SEPTRA DS) 800-160 MG per tablet; One by mouth twice a day for ten days.    Essential hypertension: Uncontrolled, patient would prefer to postpone adjusting hypertensive regimen until pain control is improved with left wrist and groin Restless leg: Uncontrolled, stop gabapentin, will start each bedtime Mirapex Left wrist and forearm pain: Plain films were obtained which ruled out fracture, case was reviewed with Dr. Karie Schwalbe. in sports medicine patient will be advised to wear wrist splint 24 hours a day for the next 2 weeks and return to see Dr. Karie Schwalbe. if pain not improving Infected sebaceous cyst: Due to the location of the cyst and possible anatomical complications Will refer patient to urology for further management, start Bactrim in the meantime  40 minutes spent face-to-face during visit today of which at least 50% was counseling or coordinating care regarding left forearm and wrist pain, infected sebaceous cyst, hypertension, restless leg syndrome.    Return in about 4 weeks (around 05/17/2012).

## 2012-04-20 ENCOUNTER — Encounter: Payer: Self-pay | Admitting: Family Medicine

## 2012-04-20 ENCOUNTER — Telehealth: Payer: Self-pay | Admitting: Family Medicine

## 2012-04-20 DIAGNOSIS — L723 Sebaceous cyst: Secondary | ICD-10-CM

## 2012-04-20 DIAGNOSIS — L089 Local infection of the skin and subcutaneous tissue, unspecified: Secondary | ICD-10-CM

## 2012-04-20 MED ORDER — DOXYCYCLINE HYCLATE 100 MG PO TABS: ORAL_TABLET | ORAL | Status: AC

## 2012-04-20 NOTE — Telephone Encounter (Signed)
Pt notified and voiced understanding 

## 2012-04-20 NOTE — Telephone Encounter (Signed)
Sue Lush, Will you please let the [facility] family know that the pharmacist alerted me that there could possibly be a conflict between the antibioitic i sent in yesterday and the methotrexate [name] is taking.  I have sent in doxycyline for the infected cyst that will not interfere with his other medications.  Sorry for the hassle.

## 2012-05-03 ENCOUNTER — Ambulatory Visit (HOSPITAL_COMMUNITY): Payer: Self-pay | Admitting: Behavioral Health

## 2012-05-04 ENCOUNTER — Ambulatory Visit (INDEPENDENT_AMBULATORY_CARE_PROVIDER_SITE_OTHER): Payer: 59 | Admitting: Behavioral Health

## 2012-05-04 ENCOUNTER — Encounter (HOSPITAL_COMMUNITY): Payer: Self-pay | Admitting: Behavioral Health

## 2012-05-04 ENCOUNTER — Ambulatory Visit (HOSPITAL_COMMUNITY): Payer: Self-pay | Admitting: Behavioral Health

## 2012-05-04 DIAGNOSIS — F4323 Adjustment disorder with mixed anxiety and depressed mood: Secondary | ICD-10-CM

## 2012-05-04 NOTE — Progress Notes (Signed)
   THERAPIST PROGRESS NOTE  Session Time: 1:00  Participation Level: Active  Behavioral Response: CasualAlertAnxious  Type of Therapy: Individual Therapy  Treatment Goals addressed: Coping  Interventions: CBT  Summary: [name] is a 42 y.o. male who presents with anxiety.   Suicidal/Homicidal: Nowithout intent/plan  Therapist Response: The client stated that physical health issues continue to contribute to his anxiety and depression. He stated that his rheumatologist recommended that he see a specialist for his wrist which she can refer him to. He indicates that he has a cyst which has been drinking and he is going on Friday he able to get that fixed which he says will alleviate an issue that he has been dealing with for about 3 years and is just not dealing with. He stated that he meets with his neurologist on April 14 to check the condition of his lower spine and to ask about recommendations for treatment. His rheumatologist said that he would have to make the decision about going on disability but that she would wait to hear from the neurologist to see what his recommendation is. I. also fell and bumped his wrist recently in the bad weather stating that has increased his pain. He appears to be almost constant pain with his wrist and lower back. He indicates that the cyst has been very uncomfortable and is looking for getting that treated. He indicates that he continues to struggle with moderate to high anxiety and depression. He feels that his medication for that may not be making Effexor encouraged him to call his medical Dr. to discuss a possible increase in dosage. He said that an increase in dosage of the Celexa made him somewhat loopy say he wants to talk to his medical Dr. to see what other options may be. He did say the Klonopin helps some a when necessary basis. He indicated that when living in [facility] she was being prescribed Xanax which was helpful in which he did not  abuse. We talked about the progressive muscle relaxation exercise which I had given him in the previous session. He indicated that it helps him. I suggested that he Google calming or coping skills to download a smaller from that he could use any way or and much more instantaneously. Also gave him a mood chart and ask them to document his moods over the next 30 days as well as make some notes as to what was going on in each of those days. He agreed to do so. He did contract for safety saying he has no suicidal thoughts currently but stated that if he had any suicidal thoughts or ideation he would let his fianc no, call the crisis hotline, 911 or let me know.  Plan: Return again in 3 weeks.  Diagnosis: Axis I: 300.02    Axis II: Deferred    Evaluna Utke M, [facility] 05/04/2012

## 2012-05-30 ENCOUNTER — Encounter: Payer: Self-pay | Admitting: Family Medicine

## 2012-06-02 ENCOUNTER — Encounter (HOSPITAL_COMMUNITY): Payer: Self-pay | Admitting: Behavioral Health

## 2012-06-02 ENCOUNTER — Ambulatory Visit (INDEPENDENT_AMBULATORY_CARE_PROVIDER_SITE_OTHER): Payer: 59 | Admitting: Behavioral Health

## 2012-06-02 DIAGNOSIS — F4323 Adjustment disorder with mixed anxiety and depressed mood: Secondary | ICD-10-CM

## 2012-06-02 NOTE — Progress Notes (Signed)
   THERAPIST PROGRESS NOTE  Session Time: 9:00  Participation Level: Active  Behavioral Response: CasualAlertAnxious/depressed  Type of Therapy: Individual Therapy  Treatment Goals addressed: Coping  Interventions: CBT  Summary: [name] is a 42 y.o. male who presents with anxiety and depression.   Suicidal/Homicidal: Nowithout intent/plan  Therapist Response: The client stated that some positive steps have taken place since I saw him last session. He did meet with his rheumatologist who did a 4 hour and 30 minutes test  to determine physical ability to perform in various situations. He indicated that he failed the test and was so exhausted after the test said he slept the rest of the day. He stated that his rheumatologist said this would be a good indicator of the clients  need to apply for long-term disability as he cannot physically perform in a job setting any longer. His anxiety and depression related to medical issues also place him in a position where he feels he cannot perform the job as he would like to do. He is meeting with an orthopedic surgeon to have his wrist fixed soon. He also will have a dye injection so that his neurosurgeon can see what is causing the lower back pain. He continues to report some difficulty sleeping due to anxiety and racing thoughts as well as physical pain. The client asked that I write a letter that could be given to his disability attorney describing the anxiety and depression related to medical and physical health issues. We reviewed his coping skills including breathing, progressive muscle relaxation, reading, taking baths and showers for relaxation, watching movies, talking to his girlfriend and using visualization. The client does contract for safety saying he has no thoughts of hurting himself or anyone else. I did tell him I would have a letter written for him over the next few days and call him to come pick that up.  Plan: Return again in 3   weeks.  Diagnosis: Axis I: 296.32/309.28    Axis II: Deferred    French Ana, [facility] 06/02/2012

## 2012-06-03 ENCOUNTER — Encounter (HOSPITAL_COMMUNITY): Payer: Self-pay | Admitting: Behavioral Health

## 2012-06-17 ENCOUNTER — Encounter: Payer: Self-pay | Admitting: Family Medicine

## 2012-06-22 ENCOUNTER — Encounter: Payer: Self-pay | Admitting: *Deleted

## 2012-06-22 LAB — CALCIUM: Calcium: 10.6 mg/dL

## 2012-06-29 ENCOUNTER — Encounter (HOSPITAL_COMMUNITY): Payer: Self-pay | Admitting: Behavioral Health

## 2012-06-29 ENCOUNTER — Ambulatory Visit (INDEPENDENT_AMBULATORY_CARE_PROVIDER_SITE_OTHER): Payer: 59 | Admitting: Behavioral Health

## 2012-06-29 DIAGNOSIS — F064 Anxiety disorder due to known physiological condition: Secondary | ICD-10-CM

## 2012-06-29 NOTE — Progress Notes (Signed)
   THERAPIST PROGRESS NOTE  Session Time: 10:00  Participation Level: Active  Behavioral Response: CasualAlertAnxious/depressed  Type of Therapy: Individual Therapy  Treatment Goals addressed: Coping  Interventions: CBT  Summary: [name] is a 42 y.o. male who presents with anxiety.   Suicidal/Homicidal: Nowithout intent/plan  Therapist Response: The client stated that he has had some difficulty sleeping do to anxiety and racing thoughts. He stated that he is no longer officially employed and has no income coming in after the next one or 2 weeks. He did say that was what his fiance makes in his 401(k) they can survive. He is in the process of applying for long-term disability. He indicates that he did that. Told him to apply the first time in if he was turned down that she would apply for him. He will be meeting with an orthopedist about his wrist and has been referred by his neurologist 2 and another urologist for a second opinion. He also indicated that he would like to start seeing Dr. Hilton Cork for psychiatric medications so we did set him up to see Dr. Demetrius Charity. Use cognitive behavioral therapy to focus on his drinks in the positives in his life as a way to replace negative thoughts which have been keeping him from sleeping. He does report that he sleeps but not traditionally in a nighttime pattern. He has multiple coping skills which he does use consistently. He does contract for safety saying he has no thoughts of hurting himself or anyone else. He reports that his fiance as well as teenage and adult children are supportive.  Plan: Return again in 4 weeks.  Diagnosis: Axis I: 293.84    Axis II: Deferred    French Ana, [facility] 06/29/2012

## 2012-07-06 ENCOUNTER — Ambulatory Visit (HOSPITAL_COMMUNITY): Payer: Self-pay | Admitting: Psychiatry

## 2012-07-07 ENCOUNTER — Encounter (HOSPITAL_COMMUNITY): Payer: Self-pay | Admitting: Psychiatry

## 2012-07-07 ENCOUNTER — Ambulatory Visit (INDEPENDENT_AMBULATORY_CARE_PROVIDER_SITE_OTHER): Payer: 59 | Admitting: Psychiatry

## 2012-07-07 VITALS — BP 128/88 | HR 67 | Ht 69.0 in | Wt 240.0 lb

## 2012-07-07 DIAGNOSIS — F322 Major depressive disorder, single episode, severe without psychotic features: Secondary | ICD-10-CM

## 2012-07-07 DIAGNOSIS — F332 Major depressive disorder, recurrent severe without psychotic features: Secondary | ICD-10-CM

## 2012-07-07 MED ORDER — FLUOXETINE HCL 10 MG PO CAPS: ORAL_CAPSULE | ORAL | Status: DC

## 2012-07-07 MED ORDER — HYDROXYZINE HCL 25 MG PO TABS: 25.0000 mg | ORAL_TABLET | Freq: Three times a day (TID) | ORAL | Status: DC | PRN
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
## 2012-07-27 ENCOUNTER — Ambulatory Visit (HOSPITAL_COMMUNITY): Payer: Self-pay | Admitting: Behavioral Health

## 2012-08-03 ENCOUNTER — Other Ambulatory Visit (HOSPITAL_COMMUNITY): Payer: Self-pay | Admitting: Psychiatry

## 2012-08-03 ENCOUNTER — Telehealth (HOSPITAL_COMMUNITY): Payer: Self-pay | Admitting: Psychiatry

## 2012-08-03 DIAGNOSIS — F322 Major depressive disorder, single episode, severe without psychotic features: Secondary | ICD-10-CM

## 2012-08-03 MED ORDER — FLUOXETINE HCL 40 MG PO CAPS: 40.0000 mg | ORAL_CAPSULE | Freq: Every day | ORAL | Status: DC

## 2012-08-03 NOTE — Telephone Encounter (Signed)
Patient is currently taking 40 mg of fluoxetine daily.  He denies any SI/HI/AVH or medication side effects. Will refill at 40 mg.

## 2012-08-09 ENCOUNTER — Other Ambulatory Visit: Payer: Self-pay | Admitting: Family Medicine

## 2012-08-11 ENCOUNTER — Ambulatory Visit (HOSPITAL_COMMUNITY): Payer: Self-pay | Admitting: Psychiatry

## 2012-08-29 ENCOUNTER — Ambulatory Visit (INDEPENDENT_AMBULATORY_CARE_PROVIDER_SITE_OTHER): Payer: 59 | Admitting: Psychiatry

## 2012-08-29 ENCOUNTER — Encounter (HOSPITAL_COMMUNITY): Payer: Self-pay | Admitting: Psychiatry

## 2012-08-29 VITALS — BP 148/102 | HR 88 | Ht 69.0 in | Wt 225.5 lb

## 2012-08-29 DIAGNOSIS — F322 Major depressive disorder, single episode, severe without psychotic features: Secondary | ICD-10-CM

## 2012-08-29 DIAGNOSIS — F332 Major depressive disorder, recurrent severe without psychotic features: Secondary | ICD-10-CM

## 2012-08-29 MED ORDER — FLUOXETINE HCL 20 MG PO CAPS: 60.0000 mg | ORAL_CAPSULE | Freq: Every day | ORAL | Status: DC

## 2012-08-29 MED ORDER — HYDROXYZINE HCL 25 MG PO TABS: 25.0000 mg | ORAL_TABLET | Freq: Three times a day (TID) | ORAL | Status: DC | PRN
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
## 2012-08-31 DIAGNOSIS — F322 Major depressive disorder, single episode, severe without psychotic features: Secondary | ICD-10-CM | POA: Insufficient documentation

## 2012-09-07 ENCOUNTER — Encounter (HOSPITAL_COMMUNITY): Payer: Self-pay | Admitting: Behavioral Health

## 2012-09-07 ENCOUNTER — Ambulatory Visit (INDEPENDENT_AMBULATORY_CARE_PROVIDER_SITE_OTHER): Payer: 59 | Admitting: Behavioral Health

## 2012-09-07 DIAGNOSIS — F331 Major depressive disorder, recurrent, moderate: Secondary | ICD-10-CM

## 2012-09-07 DIAGNOSIS — F064 Anxiety disorder due to known physiological condition: Secondary | ICD-10-CM

## 2012-09-07 NOTE — Progress Notes (Signed)
   THERAPIST PROGRESS NOTE  Session Time: 11:00  Participation Level: Active  Behavioral Response: CasualAlertDepressed/anxious  Type of Therapy: Individual Therapy  Treatment Goals addressed: Coping  Interventions: CBT  Summary: [name] is a 42 y.o. male who presents with anxiety and depression.   Suicidal/Homicidal: Nowithout intent/plan  Therapist Response: The client reported that since I saw him last he was turned down for disability for the first time. He indicated that he will speak with an attorney and allow her to handle his disability claim from this point forward. He stated that his short-term disability to his insurance company/work was denied so he no longer has insurance or income. He reports that he will be applying for long-term disability through insurance/work. He has meeting with social services on August 18 to be able to continue his medication.  He continues to report ongoing physical issues. He has not been able to me with an orthopedist about his wrist or a neurologist about his back due to not having insurance. He did indicate that he thinks his rheumatologist will help him fill out the long-term disability paper work and I encouraged him to pursue that as quickly as possible. He does report continued depression and anxiety related to physical issues, not being able to work and now not having disability income. He does report that he is using his coping skills and for the most part they do bring some relief from anxiety and depression. He reports that he has lost 14 pounds do to changing his eating habits and is now drinking primarily water. He does report some deficit in sleep at night due to physical discomfort but indicates that he can get some naps during the day if he does not sleep well at night. He does contract for safety. Due to limited financial resources the client has requested that I meet with him once a month which I agree to as long as he contracts  for safety.. I did tell him that if he did not feel safe to please call in for earlier appointment if it is during work hours her to call 911 if it is after 5:00 PM.   Plan: Return again in 4 weeks.  Diagnosis: Axis I: 296.32    Axis II: Deferred    French Ana, [facility] 09/07/2012

## 2012-09-29 ENCOUNTER — Ambulatory Visit (HOSPITAL_COMMUNITY): Payer: Self-pay | Admitting: Psychiatry

## 2012-10-11 ENCOUNTER — Encounter (HOSPITAL_COMMUNITY): Payer: Self-pay | Admitting: Behavioral Health

## 2012-10-11 ENCOUNTER — Ambulatory Visit (INDEPENDENT_AMBULATORY_CARE_PROVIDER_SITE_OTHER): Payer: 59 | Admitting: Behavioral Health

## 2012-10-11 DIAGNOSIS — F331 Major depressive disorder, recurrent, moderate: Secondary | ICD-10-CM

## 2012-10-11 NOTE — Progress Notes (Signed)
   THERAPIST PROGRESS NOTE  Session Time: 10:00  Participation Level: Active  Behavioral Response: CasualAlertDepressed  Type of Therapy: Individual Therapy  Treatment Goals addressed: Coping  Interventions: CBT  Summary: [name] is a 42 y.o. male who presents with depression and anxiety.   Suicidal/Homicidal: Nowithout intent/plan  Therapist Response: A the client presented with significant physical pain in his knee, lower back, and shoulder. He indicated that he had run out of most of his pain medication and currently does not have insurance or the financial means to get new prescriptions. He does say that ibuprofen alleviate some of the pain and he has procedures such as warm baths, topical rubs, bring some temporary relief. He is scheduled to meet with social services about being able to get medication through them. He reports that he was denied for disability on his first application and is working with an attorney to apply a second time.Marland Kitchen He does report depression and anxiety related to significant physical pain and not being able to work. The client is very proactive in using healthy coping skills to alleviate his depression and anxiety. He is reading, listening to relaxation CD, taking baths and showers, watching movies, using cognitive behavioral therapy to employ positive thought insertion. His attitude remains in spite of significant physical pain. He reports that time with his girlfriend, children and grandchildren is also very positive for him even though he cannot be physically active with them. He does contract for safety having no thoughts of hurting himself or anyone else.   Plan: Return again in 4 weeks.  Diagnosis: Axis I: 296.32/300.02    Axis II: Deferred    French Ana, [facility] 10/11/2012

## 2012-10-20 ENCOUNTER — Ambulatory Visit (INDEPENDENT_AMBULATORY_CARE_PROVIDER_SITE_OTHER): Payer: 59 | Admitting: Psychiatry

## 2012-10-20 ENCOUNTER — Encounter (HOSPITAL_COMMUNITY): Payer: Self-pay | Admitting: Psychiatry

## 2012-10-20 ENCOUNTER — Telehealth: Payer: Self-pay | Admitting: Family Medicine

## 2012-10-20 VITALS — BP 133/93 | HR 89 | Ht 69.0 in | Wt 224.0 lb

## 2012-10-20 DIAGNOSIS — F332 Major depressive disorder, recurrent severe without psychotic features: Secondary | ICD-10-CM

## 2012-10-20 DIAGNOSIS — I10 Essential (primary) hypertension: Secondary | ICD-10-CM

## 2012-10-20 DIAGNOSIS — F322 Major depressive disorder, single episode, severe without psychotic features: Secondary | ICD-10-CM

## 2012-10-20 MED ORDER — HYDROXYZINE HCL 25 MG PO TABS: 25.0000 mg | ORAL_TABLET | Freq: Three times a day (TID) | ORAL | Status: DC | PRN

## 2012-10-20 MED ORDER — PRAMIPEXOLE DIHYDROCHLORIDE 0.125 MG PO TABS: ORAL_TABLET | ORAL | Status: DC

## 2012-10-20 MED ORDER — FLUOXETINE HCL 40 MG PO CAPS: 80.0000 mg | ORAL_CAPSULE | Freq: Every day | ORAL | Status: DC

## 2012-10-20 MED ORDER — HYDROCHLOROTHIAZIDE 50 MG PO TABS: 50.0000 mg | ORAL_TABLET | Freq: Every day | ORAL | Status: DC
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
## 2012-10-20 NOTE — Telephone Encounter (Signed)
Patient walk-in scheduled an appointment for 11/02/12 but request to know if Bp and leg medicine can be called into their mail order pharmacy for 90 day supply. Its a place they use that gives a discount for prescriptions. If you can call them to advise. Thanks

## 2012-10-20 NOTE — Telephone Encounter (Signed)
Called and left message on pt's vm that I can call in 2 weeks worth of medication for the BP medication and the mirapex to pharm to get him to his appt time on 11/02/2012

## 2012-11-02 ENCOUNTER — Ambulatory Visit (INDEPENDENT_AMBULATORY_CARE_PROVIDER_SITE_OTHER): Payer: 59 | Admitting: Family Medicine

## 2012-11-02 ENCOUNTER — Encounter: Payer: Self-pay | Admitting: Family Medicine

## 2012-11-02 VITALS — BP 147/98 | HR 76 | Wt 224.0 lb

## 2012-11-02 DIAGNOSIS — I10 Essential (primary) hypertension: Secondary | ICD-10-CM

## 2012-11-02 DIAGNOSIS — H9313 Tinnitus, bilateral: Secondary | ICD-10-CM

## 2012-11-02 DIAGNOSIS — H9319 Tinnitus, unspecified ear: Secondary | ICD-10-CM

## 2012-11-02 DIAGNOSIS — G2581 Restless legs syndrome: Secondary | ICD-10-CM

## 2012-11-02 DIAGNOSIS — L723 Sebaceous cyst: Secondary | ICD-10-CM

## 2012-11-02 DIAGNOSIS — R739 Hyperglycemia, unspecified: Secondary | ICD-10-CM | POA: Insufficient documentation

## 2012-11-02 MED ORDER — CYCLOBENZAPRINE HCL 10 MG PO TABS: 10.0000 mg | ORAL_TABLET | Freq: Three times a day (TID) | ORAL | Status: DC | PRN

## 2012-11-02 MED ORDER — PROMETHAZINE HCL 25 MG PO TABS: 25.0000 mg | ORAL_TABLET | Freq: Four times a day (QID) | ORAL | Status: DC | PRN

## 2012-11-02 MED ORDER — LISINOPRIL-HYDROCHLOROTHIAZIDE 20-25 MG PO TABS: 1.0000 | ORAL_TABLET | Freq: Every day | ORAL | Status: DC

## 2012-11-02 MED ORDER — PRAMIPEXOLE DIHYDROCHLORIDE 0.125 MG PO TABS: ORAL_TABLET | ORAL | Status: DC

## 2012-11-02 NOTE — Progress Notes (Signed)
CC: [name] is a 42 y.o. male is here for Hypertension   Subjective: HPI:  Followup hypertension: He has been taking hydrochlorothiazide 50 mg daily without missed doses. He denies known side effects. He reports that outside offices blood pressures have been well above 140/90. At home he has had similar results. Nothing particularly makes his pressure better or worse. Seems to be elevated all days of the week.  Patient complains of a mass above the right eyebrow that since I saw him last was enlarging to the size of a penny in diameter. It was painful tender to the touch was causing the headache. He punctured this with a clean needle he had yellow discharge with immediate relief of pain. He reports residual mild pain does not interfere with quality of life swelling is mild in severity. It has not been growing over the past month.  Complains of tinnitus in both years has been present for 2 months not getting better or worse mild to moderate in severity seems to be worse in quiet environments nothing else seems to make better or worse. He denies dizziness, headaches, hearing loss nor roaring sensation in either ear. Denies motor or sensory disturbances other than above   Patient is requesting refills on Mirapex he is taking 1-2 doses every evening before bed has noticed drastic improvement with restless legs on this regimen. If he misses a dose before bed he will have trouble sleeping due to constant sensation and urge to move legs. Denies weakness of either lower extremity   Review Of Systems Outlined In HPI  Past Medical History  Diagnosis Date  . Pericarditis   . History of lithotripsy   . Rheumatoid arthritis(714.0)   . Gout   . Hypertension   . Kidney stones   . Renal tubular acidosis   . Anxiety   . Nephrolithiasis 12/11/2011  . Lumbar degenerative disc disease 12/11/2011  . Essential hypertension 12/11/2011  . Arthritis   . Depression      Family History  Problem  Relation Age of Onset  . Hypertension Father   . Rheum arthritis Father   . Alcohol abuse Father   . Heart attack Father   . Diabetes Mellitus II Father   . Coronary artery disease Father   . Diabetes Mellitus II Mother   . Cancer - Colon Mother   . Coronary artery disease Mother   . Heart attack Mother   . Ovarian cancer Mother   . Uterine cancer Mother   . Dementia Neg Hx   . Bipolar disorder Neg Hx   . Depression Neg Hx   . Drug abuse Neg Hx   . OCD Neg Hx      History  Substance Use Topics  . Smoking status: Current Every Day Smoker -- 1.00 packs/day for 29 years  . Smokeless tobacco: Not on file     Comment: Using e-cigarette  . Alcohol Use: No     Objective: Filed Vitals:   11/02/12 0957  BP: 147/98  Pulse: 76    General: Alert and Oriented, No Acute Distress HEENT: Pupils equal, round, reactive to light. Conjunctivae clear.  External ears unremarkable, canals clear with intact TMs with appropriate landmarks.  Middle ear appears open without effusion. Pink inferior turbinates.  Moist mucous membranes, pharynx without inflammation nor lesions.  Neck supple without palpable lymphadenopathy nor abnormal masses. Lungs: Clear to auscultation bilaterally, no wheezing/ronchi/rales.  Comfortable work of breathing. Good air movement. Cardiac: Regular rate and rhythm. Normal  S1/S2.  No murmurs, rubs, nor gallops.   Extremities: No peripheral edema.  Strong peripheral pulses.  Mental Status: No depression, anxiety, nor agitation. Skin: Warm and dry. Noninflamed quarter centimeter sebaceous cyst slightly tender above the right eyebrow without erythema  Assessment & Plan: [name] was seen today for hypertension.  Diagnoses and associated orders for this visit:  Essential hypertension  Tinnitus, bilateral  Sebaceous cyst  Restless leg  Other Orders - lisinopril-hydrochlorothiazide (PRINZIDE,ZESTORETIC) 20-25 MG per tablet; Take 1 tablet by mouth  daily. - cyclobenzaprine (FLEXERIL) 10 MG tablet; Take 1 tablet (10 mg total) by mouth 3 (three) times daily as needed. - pramipexole (MIRAPEX) 0.125 MG tablet; TAKE ONE TO TWO TABLETS BY MOUTH IN THE EVENING BEFORE  BED  FOR  RESTLESS  LEGS - promethazine (PHENERGAN) 25 MG tablet; Take 1 tablet (25 mg total) by mouth every 6 (six) hours as needed for nausea.    Essential hypertension: Uncontrolled therefore will add lisinopril new dose lisinopril 20 mg hydrochlorothiazide 25 mg daily, drop off blood pressure diary one week after starting this regimen Tinnitus: Discussed trying lipoflavanoid, we'll consider audiology referral if hearing loss occurs or once he regains insurance Sebaceous cyst: No current indication for removal discussed with patient I be happy to remove this should pain worsen or swelling worsen Restless leg syndrome: Controlled continue Imitrex will  25 minutes spent face-to-face during visit today of which at least 50% was counseling or coordinating care regarding essential hypertension, tinnitus, sebaceous cyst, restless leg syndrome   Return in about 3 months (around 02/02/2013).

## 2012-11-10 ENCOUNTER — Ambulatory Visit (HOSPITAL_COMMUNITY): Payer: Self-pay | Admitting: Behavioral Health

## 2012-11-22 ENCOUNTER — Ambulatory Visit (HOSPITAL_COMMUNITY): Payer: Self-pay | Admitting: Psychiatry

## 2012-12-14 ENCOUNTER — Encounter (HOSPITAL_COMMUNITY): Payer: Self-pay | Admitting: Behavioral Health

## 2012-12-14 ENCOUNTER — Ambulatory Visit (INDEPENDENT_AMBULATORY_CARE_PROVIDER_SITE_OTHER): Payer: Self-pay | Admitting: Behavioral Health

## 2012-12-14 ENCOUNTER — Encounter (INDEPENDENT_AMBULATORY_CARE_PROVIDER_SITE_OTHER): Payer: Self-pay

## 2012-12-14 DIAGNOSIS — F331 Major depressive disorder, recurrent, moderate: Secondary | ICD-10-CM

## 2012-12-14 DIAGNOSIS — F411 Generalized anxiety disorder: Secondary | ICD-10-CM

## 2012-12-14 NOTE — Progress Notes (Signed)
   THERAPIST PROGRESS NOTE  Session Time: 10:00  Participation Level: Active  Behavioral Response: CasualAlertDepressed/anxious  Type of Therapy: Individual Therapy  Treatment Goals addressed: Coping  Interventions: CBT  Summary: [name] is a 42 y.o. male who presents with anxiety and depression.   Suicidal/Homicidal: Nowithout intent/plan  Therapist Response: The client presents with moderate to severe depression as well as moderate to severe anxiety. He has multiple stressors. He indicates that physically he is in significant pain with his neck, shoulders, knees, wrist, feet and legs as well as his back. He reports that he has extreme anxiety related to finances. He also indicated that he is back on his pain medication which helps some.   He stated that his short-term disability as indicated from work. He has no insurance. He will be applying with an attorney to help for long-term disability but was denied the first time when he applied on his own. He indicates that he is in pain most the time which limits anything that he can do around the house indicating that he does not physically feel much like going anywhere or doing anything. We did review coping skills for talked about ways that he could relieve anxiety and depression as much as possible. His fiancee is very supportive as well as his children are very supportive. He does contract for safety saying he has no thoughts of hurting himself or anyone else. He did say that if he got to the point that he was suicidal he would let his fianc for me know. I recommended that he call 911 or the crisis hotline if he did feel suicidal. Also toward the procedure that we reduce it to call me including going to the [facility] long emergency department to be cleared medically before possibly being admitted.  Plan: Return again in 4 weeks.  Diagnosis: Axis I: 296.32/300.02    Axis II: Deferred    [place], [facility] 12/14/2012

## 2013-01-13 ENCOUNTER — Ambulatory Visit (HOSPITAL_COMMUNITY): Payer: Self-pay | Admitting: Behavioral Health

## 2013-01-23 ENCOUNTER — Ambulatory Visit (INDEPENDENT_AMBULATORY_CARE_PROVIDER_SITE_OTHER): Payer: Self-pay | Admitting: Psychiatry

## 2013-01-23 ENCOUNTER — Encounter (HOSPITAL_COMMUNITY): Payer: Self-pay | Admitting: Psychiatry

## 2013-01-23 VITALS — BP 126/96 | HR 89 | Ht 69.0 in | Wt 226.0 lb

## 2013-01-23 DIAGNOSIS — F332 Major depressive disorder, recurrent severe without psychotic features: Secondary | ICD-10-CM

## 2013-01-23 DIAGNOSIS — F322 Major depressive disorder, single episode, severe without psychotic features: Secondary | ICD-10-CM
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
## 2013-02-10 ENCOUNTER — Ambulatory Visit (INDEPENDENT_AMBULATORY_CARE_PROVIDER_SITE_OTHER): Payer: Self-pay | Admitting: Family Medicine

## 2013-02-10 ENCOUNTER — Encounter: Payer: Self-pay | Admitting: Family Medicine

## 2013-02-10 VITALS — BP 126/91 | HR 100 | Wt 223.0 lb

## 2013-02-10 DIAGNOSIS — G43909 Migraine, unspecified, not intractable, without status migrainosus: Secondary | ICD-10-CM

## 2013-02-10 DIAGNOSIS — M7660 Achilles tendinitis, unspecified leg: Secondary | ICD-10-CM

## 2013-02-10 DIAGNOSIS — M069 Rheumatoid arthritis, unspecified: Secondary | ICD-10-CM

## 2013-02-10 DIAGNOSIS — E785 Hyperlipidemia, unspecified: Secondary | ICD-10-CM

## 2013-02-10 DIAGNOSIS — I10 Essential (primary) hypertension: Secondary | ICD-10-CM

## 2013-02-10 MED ORDER — ELETRIPTAN HYDROBROMIDE 40 MG PO TABS: 40.0000 mg | ORAL_TABLET | ORAL | Status: DC | PRN

## 2013-02-10 MED ORDER — OXYCODONE-ACETAMINOPHEN 5-325 MG PO TABS: 1.0000 | ORAL_TABLET | Freq: Four times a day (QID) | ORAL | Status: DC | PRN

## 2013-02-10 NOTE — Progress Notes (Signed)
CC: [name] is a 43 y.o. male is here for Hypertension   Subjective: HPI:  Follow up rheumatoid arthritis: Continues to see rheumatology every 3-6 months. Still takes methotrexate blood work has been monitored through rheumatology.  Has been unable to afford any other biologics due to lack of insurance. He is in the process of getting a lawyer for disability claims.  Complains of continued moderate to  severe pain in the right ankle, right and left shoulders, occasionally in the right knee. Pain is present most days of the week worse with activity slightly improved with nonsteroidal anti-inflammatories. He has been given Percocet in the past that he has been using sparingly when his pain is severe it will 100% relief his pain.  Denies any mechanical symptoms such as locking catching or giving way of any of the joints bothering him.  Followup essential hypertension: Since increasing his lisinopril-hydrochlorothiazide he denies any known side effects there are no outside blood pressures to report. Denies chest pain, shortness of breath, orthopnea nor peripheral edema  Hyperlipidemia: He is due for repeat cholesterol however due to finances he is trying to avoid anything not essential until he gets disability in the near future  Complains of right posterior ankle pain that is localized  In his lower calf. Described as a burning and toothache moderate in severity worse the longer he is ambulating. Denies swelling redness or warmth at the site of discomfort. No recent or remote trauma to this location.  Followup migraine: Patient is requesting if we have any samples of triptans that he has been using for migraines he gets about one to 2 times a month. Migraines are described as pulsatile pain in the right side of head unchange in severity character or frequency over the past year.   Review Of Systems Outlined In HPI  Past Medical History  Diagnosis Date  . Pericarditis   . History of  lithotripsy   . Rheumatoid arthritis(714.0)   . Gout   . Hypertension   . Kidney stones   . Renal tubular acidosis   . Anxiety   . Nephrolithiasis 12/11/2011  . Lumbar degenerative disc disease 12/11/2011  . Essential hypertension 12/11/2011  . Arthritis   . Depression      Family History  Problem Relation Age of Onset  . Hypertension Father   . Rheum arthritis Father   . Alcohol abuse Father   . Heart attack Father   . Diabetes Mellitus II Father   . Coronary artery disease Father   . Diabetes Mellitus II Mother   . Cancer - Colon Mother   . Coronary artery disease Mother   . Heart attack Mother   . Ovarian cancer Mother   . Uterine cancer Mother   . Dementia Neg Hx   . Bipolar disorder Neg Hx   . Depression Neg Hx   . Drug abuse Neg Hx   . OCD Neg Hx      History  Substance Use Topics  . Smoking status: Current Every Day Smoker -- 0.40 packs/day for 29 years  . Smokeless tobacco: Not on file     Comment: Using e-cigarette  . Alcohol Use: No     Objective: Filed Vitals:   02/10/13 1045  BP: 126/91  Pulse: 100    General: Alert and Oriented, No Acute Distress HEENT: Pupils equal, round, reactive to light. Conjunctivae clear.   moist mucous membranes pharynx unremarkable  Lungs: Clear to auscultation bilaterally, no wheezing/ronchi/rales.  Comfortable work  of breathing. Good air movement. Cardiac: Regular rate and rhythm. Normal S1/S2.  No murmurs, rubs, nor gallops.   Extremities: No peripheral edema.  Strong peripheral pulses.   right shoulder has negative Neer's, positive Hawkins, negative empty can, negative crossarm, pain is reproduced with palpation of a.c. joint.  Left ankle exam shows no tenderness over medial or lateral malleoli, no pain with compression of medial fibula/tibia, negative anterior drawer, no swelling redness or warmth , no pain with resisted eversion or inversion . Mental Status: No depression, anxiety, nor agitation. Skin: Warm and  dry.  Assessment & Plan: Octavius was seen today for hypertension.  Diagnoses and associated orders for this visit:  Essential hypertension  Hyperlipidemia LDL goal <130  Rheumatoid PNTIRWERX(540.0)  Achilles tendinitis  Migraine - eletriptan (RELPAX) 40 MG tablet; Take 1 tablet (40 mg total) by mouth as needed for migraine or headache. One tablet by mouth at onset of headache. May repeat in 2 hours if headache persists or recurs.  Other Orders - oxyCODONE-acetaminophen (PERCOCET) 5-325 MG per tablet; Take 1-2 tablets by mouth every 6 (six) hours as needed for moderate pain.     essential hypertension: Controlled discussed sodium restriction to help minimize blood pressure Hyperlipidemia: Overdue for a lipid panel pending insurance we will have this done in the near future Rheumatoid arthritis: Uncontrolled pain therefore continue nonsteroidal anti-inflammatories and began as needed Percocet to be provided in the interim while he is waiting for placement at a local pain clinic Achilles tendinitis: Given home exercise handout to be done on a daily basis for rehabilitation Migraine: Controlled continue as needed triptan  Return in about 3 months (around 05/11/2013).

## 2013-02-23 ENCOUNTER — Ambulatory Visit (HOSPITAL_COMMUNITY): Payer: Self-pay | Admitting: Behavioral Health

## 2013-03-08 ENCOUNTER — Ambulatory Visit (INDEPENDENT_AMBULATORY_CARE_PROVIDER_SITE_OTHER): Payer: Self-pay | Admitting: Psychiatry

## 2013-03-08 ENCOUNTER — Encounter (INDEPENDENT_AMBULATORY_CARE_PROVIDER_SITE_OTHER): Payer: Self-pay

## 2013-03-08 ENCOUNTER — Encounter (HOSPITAL_COMMUNITY): Payer: Self-pay | Admitting: Psychiatry

## 2013-03-08 VITALS — BP 116/85 | HR 96 | Wt 227.0 lb

## 2013-03-08 DIAGNOSIS — F322 Major depressive disorder, single episode, severe without psychotic features: Secondary | ICD-10-CM

## 2013-03-08 DIAGNOSIS — F332 Major depressive disorder, recurrent severe without psychotic features: Secondary | ICD-10-CM

## 2013-03-08 MED ORDER — TRAZODONE HCL 50 MG PO TABS: ORAL_TABLET | ORAL | Status: DC
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
## 2013-03-10 ENCOUNTER — Encounter (HOSPITAL_COMMUNITY): Payer: Self-pay | Admitting: Behavioral Health

## 2013-03-10 ENCOUNTER — Ambulatory Visit (INDEPENDENT_AMBULATORY_CARE_PROVIDER_SITE_OTHER): Payer: Self-pay | Admitting: Family Medicine

## 2013-03-10 ENCOUNTER — Encounter: Payer: Self-pay | Admitting: Family Medicine

## 2013-03-10 ENCOUNTER — Ambulatory Visit (INDEPENDENT_AMBULATORY_CARE_PROVIDER_SITE_OTHER): Payer: Self-pay | Admitting: Behavioral Health

## 2013-03-10 ENCOUNTER — Ambulatory Visit: Payer: Self-pay | Admitting: Family Medicine

## 2013-03-10 VITALS — BP 134/92 | HR 90 | Temp 97.9°F | Wt 227.0 lb

## 2013-03-10 DIAGNOSIS — K219 Gastro-esophageal reflux disease without esophagitis: Secondary | ICD-10-CM

## 2013-03-10 DIAGNOSIS — F331 Major depressive disorder, recurrent, moderate: Secondary | ICD-10-CM

## 2013-03-10 DIAGNOSIS — M069 Rheumatoid arthritis, unspecified: Secondary | ICD-10-CM

## 2013-03-10 DIAGNOSIS — G2581 Restless legs syndrome: Secondary | ICD-10-CM

## 2013-03-10 DIAGNOSIS — F064 Anxiety disorder due to known physiological condition: Secondary | ICD-10-CM

## 2013-03-10 DIAGNOSIS — J040 Acute laryngitis: Secondary | ICD-10-CM

## 2013-03-10 MED ORDER — PREDNISONE 20 MG PO TABS: ORAL_TABLET | ORAL | Status: AC

## 2013-03-10 MED ORDER — AMBULATORY NON FORMULARY MEDICATION: Status: DC

## 2013-03-10 MED ORDER — ESOMEPRAZOLE MAGNESIUM 20 MG PO PACK: 20.0000 mg | PACK | Freq: Every day | ORAL | Status: DC

## 2013-03-10 MED ORDER — PREGABALIN 50 MG PO CAPS: 50.0000 mg | ORAL_CAPSULE | Freq: Every day | ORAL | Status: DC

## 2013-03-10 NOTE — Progress Notes (Signed)
CC: [name] is a 43 y.o. male is here for Cough   Subjective: HPI:  Complains of cough and loss of voice it has been present on a daily basis for the past 4 weeks worse the more he talks overall moderate in severity. Worse at the end of the day slightly improved after sleeping. Cough is described as nonproductive, there shortness of breath only when coughing. Chest tightness present only when coughing. No blood in sputum. He denies fevers, chills, nasal congestion, sinus pressure, ear pain pain, sore throat, wheezing, dysphasia. He does report worsening GERD symptoms.  Complains of persistent restless legs mostly bothersome at night significantly interfere with sleep moderate to severe in severity. He was unable to fill mirapex due to financial issues. Denies any other motor or sensory disturbances recently or remotely.  Follow rheumatoid arthritis: His rheumatologist has taken him off of methotrexate temporarily to see what kind of response he will have. He reports worsening bilateral knee pain improves with using a cane however it he would like a prescription for this which is why he's only using his wife's and has not bought when you   Review Of Systems Outlined In HPI  Past Medical History  Diagnosis Date  . Pericarditis   . History of lithotripsy   . Rheumatoid arthritis(714.0)   . Gout   . Hypertension   . Kidney stones   . Renal tubular acidosis   . Anxiety   . Nephrolithiasis 12/11/2011  . Lumbar degenerative disc disease 12/11/2011  . Essential hypertension 12/11/2011  . Arthritis   . Depression     Past Surgical History  Procedure Laterality Date  . Hemorroidectomy    . Kidney stone surgery    . Anterior cervical decomp/discectomy fusion     Family History  Problem Relation Age of Onset  . Hypertension Father   . Rheum arthritis Father   . Alcohol abuse Father   . Heart attack Father   . Diabetes Mellitus II Father   . Coronary artery disease Father   .  Diabetes Mellitus II Mother   . Cancer - Colon Mother   . Coronary artery disease Mother   . Heart attack Mother   . Ovarian cancer Mother   . Uterine cancer Mother   . Dementia Neg Hx   . Bipolar disorder Neg Hx   . Depression Neg Hx   . Drug abuse Neg Hx   . OCD Neg Hx     History   Social History  . Marital Status: Legally Separated    Spouse Name: N/A    Number of Children: N/A  . Years of Education: N/A   Occupational History  . Not on file.   Social History Main Topics  . Smoking status: Current Every Day Smoker -- 0.50 packs/day for 29 years  . Smokeless tobacco: Not on file     Comment: Using e-cigarette  . Alcohol Use: No  . Drug Use: No  . Sexual Activity: Yes    Partners: Female   Other Topics Concern  . Not on file   Social History Narrative  . No narrative on file     Objective: BP 134/92  Pulse 90  Temp(Src) 97.9 F (36.6 C) (Oral)  Wt 227 lb (102.967 kg)  SpO2 96%  General: Alert and Oriented, No Acute Distress HEENT: Pupils equal, round, reactive to light. Conjunctivae clear.  External ears unremarkable, canals clear with intact TMs with appropriate landmarks.  Middle ear appears open without  effusion. Pink inferior turbinates.  Moist mucous membranes, pharynx without inflammation nor lesions.  Neck supple without palpable lymphadenopathy nor abnormal masses. Lungs: Clear to auscultation bilaterally, no wheezing/ronchi/rales.  Comfortable work of breathing. Good air movement. Cardiac: Regular rate and rhythm. Normal S1/S2.  No murmurs, rubs, nor gallops.   Mental Status: No depression, anxiety, nor agitation. Skin: Warm and dry.  Assessment & Plan: Gwendolyn was seen today for cough.  Diagnoses and associated orders for this visit:  Laryngitis - predniSONE (DELTASONE) 20 MG tablet; Three tabs daily days 1-3, two tabs daily days 4-6, one tab daily days 7-9, half tab daily days 10-13.  Restless leg  GERD (gastroesophageal reflux  disease)  Rheumatoid arthritis(714.0)  Other Orders - pregabalin (LYRICA) 50 MG capsule; Take 1 capsule (50 mg total) by mouth daily. - AMBULATORY NON FORMULARY MEDICATION; Medication Name: Gilmer Mor, use as needed.  Dx: Rheumatoid Arthritis - esomeprazole (NEXIUM) 20 MG packet; Take 20 mg by mouth daily before breakfast.    Laryngitis: Start prednisone taper and focal rest Restless leg syndrome: Uncontrolled, patient states And was ineffective in the past we'll try Lyrica we have samples of this given today Rheumatoid arthritis: Managed by rheumatology however provided protection for cane discussed that he doesn't necessarily need a prescription for this however we will document that I was in agreement that he would benefit from GERD: Uncontrolled start samples of Nexium, fill over-the-counter formulation if beneficial   Return if symptoms worsen or fail to improve.

## 2013-03-10 NOTE — Progress Notes (Signed)
   THERAPIST PROGRESS NOTE  Session Time: 10:00  Participation Level: Active  Behavioral Response: CasualAlertAnxious/depressed  Type of Therapy: Individual Therapy  Treatment Goals addressed: Coping  Interventions: CBT  Summary: [name] is a 43 y.o. male who presents with anxiety and depression.   Suicidal/Homicidal: Nowithout intent/plan  Therapist Response: The client presents with significant stress and anxiety related to physical and financial issues. He reports significant pain in his shoulder, knees, wrist, and lower back. He indicates that there is a cyst on the L4 region of his back which is causing significant tingling. He reports difficulty standing for any length of time without pain. He reports that his wrist is in constant pain in his shoulder is hurting more. He reports that his rheumatologist took him off of one of the medications he was on so she could see what he look like it is worse to make sure that she could treat him as effectively as possible. She told him that if he has osteoarthritis there is minimal that she can do to help but she is being very helpful in trying to find some sense of relief for his pain. He reports that he is not sleeping well. Dr.PUTHEVEL did change some of his medication but he is only been on the increased medication for one day. He indicated that he feels a sense of hopelessness. He does contract for safety indicating that he is not suicidal and will let his girlfriend know if he is on call 911 or call our office. He did question whether he needs inpatient hospitalization. I will speak with Dr.Puthevel about the client. I asked the client to call us on Monday or Tuesday of next week to see if the medication helps him with the anxiety and the lack of sleep. He also has been doing some sort of virus which she is going to see Dr. Kary Kos today to see if her something to get relief a burning through issue and difficulty with speaking due to  raspiness. We revisited coping skills for stress and anxiety as well as depression. He does have a good support group and his girlfriend and his children. The client indicates that he can only afford to see me once a month until he gets disability. He has been denied twice but now is working with [employer]. The client said that she feels confident that she can get him disability and what that will come to the insurance as well as monthly income. He indicates a financial stress as a part was going on also as they only have prolonged his girlfriend's disability income and that makes all of his medical bills difficult.   Plan: Return again in 4 weeks.  Diagnosis: Axis I: 293.84/296.32    Axis II: Deferred    French Ana, [facility] 03/10/2013

## 2013-03-29 ENCOUNTER — Encounter (HOSPITAL_COMMUNITY): Payer: Self-pay | Admitting: Behavioral Health

## 2013-03-29 ENCOUNTER — Ambulatory Visit (INDEPENDENT_AMBULATORY_CARE_PROVIDER_SITE_OTHER): Payer: Self-pay | Admitting: Behavioral Health

## 2013-03-29 DIAGNOSIS — F064 Anxiety disorder due to known physiological condition: Secondary | ICD-10-CM

## 2013-03-29 DIAGNOSIS — F331 Major depressive disorder, recurrent, moderate: Secondary | ICD-10-CM

## 2013-03-29 NOTE — Progress Notes (Signed)
   THERAPIST PROGRESS NOTE  Session Time: 9:00  Participation Level: Active  Behavioral Response: CasualAlertAnxious/depressed  Type of Therapy: Individual Therapy  Treatment Goals addressed: Coping  Interventions: CBT  Summary: [name] is a 43 y.o. male who presents with anxiety and depression.   Suicidal/Homicidal: Nowithout intent/plan  Therapist Response: The client continues to present with anxiety and depression related to medical issues. He reports that he is rheumatologist is taking him off of one of the medications so she can see him at his worst to determine whether it is osteo or rheumatoid arthritis. He indicates that his knee is swollen significantly over the past few days but not at its worst. At that point he will call his rheumatologist to have her look at it. He indicates that he continues to hurt in his wrist and that she is attempting to speed up the process to get him into seeing an orthopedist. He reports that there are difficulties which he does not sleep well and the coworker mixed enjoyed her even more.  He does contract for safety saying that as bad as he feels physically and as much is a contribute to his anxiety and depression he is not suicidal and has no thoughts of hurting himself. We use cognitive behavioral therapy to help him focus on positive things in his life and reviewed coping skills to relieve anxiety.  Plan: Return again in 4 weeks.  Diagnosis: Axis I: 293.84    Axis II: Deferred    [name], [facility] 03/29/2013

## 2013-04-13 ENCOUNTER — Ambulatory Visit (INDEPENDENT_AMBULATORY_CARE_PROVIDER_SITE_OTHER): Payer: Self-pay | Admitting: Psychiatry

## 2013-04-13 ENCOUNTER — Encounter (HOSPITAL_COMMUNITY): Payer: Self-pay | Admitting: Psychiatry

## 2013-04-13 VITALS — BP 109/78 | HR 105 | Wt 230.0 lb

## 2013-04-13 DIAGNOSIS — F322 Major depressive disorder, single episode, severe without psychotic features: Secondary | ICD-10-CM

## 2013-04-13 DIAGNOSIS — F332 Major depressive disorder, recurrent severe without psychotic features: Secondary | ICD-10-CM

## 2013-04-13 MED ORDER — FLUOXETINE HCL 40 MG PO CAPS: 80.0000 mg | ORAL_CAPSULE | Freq: Every day | ORAL | Status: DC

## 2013-04-13 MED ORDER — HYDROXYZINE HCL 25 MG PO TABS: 50.0000 mg | ORAL_TABLET | Freq: Three times a day (TID) | ORAL | Status: DC | PRN

## 2013-04-13 MED ORDER — TRAZODONE HCL 50 MG PO TABS: 50.0000 mg | ORAL_TABLET | Freq: Every evening | ORAL | Status: DC | PRN
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
## 2013-04-20 ENCOUNTER — Telehealth: Payer: Self-pay | Admitting: *Deleted

## 2013-04-20 NOTE — Telephone Encounter (Signed)
Pt's wife called and states pt still has a dry cough. He completed the course of steroids and per his wife his cough is no better. I did advise that they f/u with Dr. Ivan Anchors. They will be caring for pt's ill grandfather for the next week or so, so they will make an appt when they return

## 2013-04-21 ENCOUNTER — Ambulatory Visit (HOSPITAL_COMMUNITY): Payer: Self-pay | Admitting: Psychiatry

## 2013-05-11 ENCOUNTER — Ambulatory Visit (INDEPENDENT_AMBULATORY_CARE_PROVIDER_SITE_OTHER): Payer: Self-pay | Admitting: Family Medicine

## 2013-05-11 ENCOUNTER — Encounter: Payer: Self-pay | Admitting: Family Medicine

## 2013-05-11 VITALS — BP 156/94 | HR 94 | Wt 238.0 lb

## 2013-05-11 DIAGNOSIS — K219 Gastro-esophageal reflux disease without esophagitis: Secondary | ICD-10-CM

## 2013-05-11 DIAGNOSIS — G2581 Restless legs syndrome: Secondary | ICD-10-CM

## 2013-05-11 DIAGNOSIS — M069 Rheumatoid arthritis, unspecified: Secondary | ICD-10-CM

## 2013-05-11 DIAGNOSIS — K625 Hemorrhage of anus and rectum: Secondary | ICD-10-CM

## 2013-05-11 DIAGNOSIS — I10 Essential (primary) hypertension: Secondary | ICD-10-CM

## 2013-05-11 MED ORDER — PREGABALIN 50 MG PO CAPS: 50.0000 mg | ORAL_CAPSULE | Freq: Every day | ORAL | Status: DC

## 2013-05-11 MED ORDER — OXYCODONE-ACETAMINOPHEN 5-325 MG PO TABS
1.0000 | ORAL_TABLET | Freq: Four times a day (QID) | ORAL | Status: DC | PRN
Start: 2013-05-11 — End: 2014-02-13

## 2013-05-11 MED ORDER — ESOMEPRAZOLE MAGNESIUM 40 MG PO CPDR: 40.0000 mg | DELAYED_RELEASE_CAPSULE | Freq: Every day | ORAL | Status: DC

## 2013-05-11 MED ORDER — AMLODIPINE BESYLATE 10 MG PO TABS: ORAL_TABLET | ORAL | Status: DC

## 2013-05-11 MED ORDER — AMBULATORY NON FORMULARY MEDICATION: Status: DC

## 2013-05-11 NOTE — Progress Notes (Signed)
CC: [name] is a 43 y.o. male is here for Hypertension   Subjective: HPI:  Followup essential hypertension: No outside blood pressures to report. Denies chest pain, shortness of breath, motor or sensory disturbances other than improved restless leg syndrome. Review of systems is positive for cough that has been present since the winter. It appears that his cough began weeks after starting lisinopril in October.  Cough is described as nonproductive not improved by allergy medication, not improved by reflux medication.  Follow rheumatoid arthritis: Continues to be off of methotrexate awaiting for significant decline in joint pain which will prompt visit with or arthritis. Continues to use oxycodone sparingly for chronic low back, elbow, knee pain. Symptoms are improved with sparing use of this medication and over-the-counter anti-inflammatories. Denies any new joint pain severity character or frequency.  Follow restless leg: Symptoms are now completely alleviated since starting Lyrica 50 mg twice a day. He denies no known side effects or intolerance. Denies any new motor or sensory disturbances  Followup GERD: States that Nexium 20 mg has completely alleviated his epigastric discomfort and acid sensation in the back of his mouth. He has run out of his medication and symptoms are slowly returning now mild in severity. Nothing particularly makes better or worse other than above  Rectal bleeding: Patient states the past one-2 years he's bleeding from the rectum almost on a daily basis. His bright red blood occasionally with clots when wiping. Symptoms have not been getting better or worse since onset. He has 2-3 painless bowel movements on a daily basis without any difficulty straining. Denies any decrease in caliber of stool. Denies unintentional weight loss nor abdominal pain other than epigastric pain described above.  Review Of Systems Outlined In HPI  Past Medical History  Diagnosis Date   . Pericarditis   . History of lithotripsy   . Rheumatoid arthritis(714.0)   . Gout   . Hypertension   . Kidney stones   . Renal tubular acidosis   . Anxiety   . Nephrolithiasis 12/11/2011  . Lumbar degenerative disc disease 12/11/2011  . Essential hypertension 12/11/2011  . Arthritis   . Depression     Past Surgical History  Procedure Laterality Date  . Hemorroidectomy    . Kidney stone surgery    . Anterior cervical decomp/discectomy fusion     Family History  Problem Relation Age of Onset  . Hypertension Father   . Rheum arthritis Father   . Alcohol abuse Father   . Heart attack Father   . Diabetes Mellitus II Father   . Coronary artery disease Father   . Diabetes Mellitus II Mother   . Cancer - Colon Mother   . Coronary artery disease Mother   . Heart attack Mother   . Ovarian cancer Mother   . Uterine cancer Mother   . Dementia Neg Hx   . Bipolar disorder Neg Hx   . Depression Neg Hx   . Drug abuse Neg Hx   . OCD Neg Hx     History   Social History  . Marital Status: Legally Separated    Spouse Name: N/A    Number of Children: N/A  . Years of Education: N/A   Occupational History  . Not on file.   Social History Main Topics  . Smoking status: Current Every Day Smoker -- 0.50 packs/day for 29 years  . Smokeless tobacco: Not on file     Comment: Nicotrol  . Alcohol Use: No  .  Drug Use: No  . Sexual Activity: Yes    Partners: Female   Other Topics Concern  . Not on file   Social History Narrative  . No narrative on file     Objective: BP 156/94  Pulse 94  Wt 238 lb (107.956 kg)  General: Alert and Oriented, No Acute Distress HEENT: Pupils equal, round, reactive to light. Conjunctivae clear.  External ears unremarkable, canals clear with intact TMs with appropriate landmarks.  Middle ear appears open without effusion. Pink inferior turbinates.  Moist mucous membranes, pharynx without inflammation nor lesions.  Neck supple without palpable  lymphadenopathy nor abnormal masses. Lungs: Clear to auscultation bilaterally, no wheezing/ronchi/rales.  Comfortable work of breathing. Good air movement. Cardiac: Regular rate and rhythm. Normal S1/S2.  No murmurs, rubs, nor gallops.   Abdomen: Obese soft nontender Extremities: No peripheral edema.  Strong peripheral pulses.  Mental Status: No depression, anxiety, nor agitation. Skin: Warm and dry.  Assessment & Plan: Richardo was seen today for hypertension.  Diagnoses and associated orders for this visit:  Essential hypertension - amLODipine (NORVASC) 10 MG tablet; One tablet by mouth every day for blood pressure control.  Restless leg - pregabalin (LYRICA) 50 MG capsule; Take 1 capsule (50 mg total) by mouth daily.  Rheumatoid arthritis - AMBULATORY NON FORMULARY MEDICATION; Medication Name: Gilmer Mor, use as needed.  Dx: Rheumatoid Arthritis - oxyCODONE-acetaminophen (PERCOCET) 5-325 MG per tablet; Take 1-2 tablets by mouth every 6 (six) hours as needed for moderate pain.  GERD (gastroesophageal reflux disease) - esomeprazole (NEXIUM) 40 MG capsule; Take 1 capsule (40 mg total) by mouth daily.  Rectal bleeding    Essential hypertension: Uncontrolled however am quite suspicious lisinopril is causing his cough therefore stop lisinopril hydrochlorothiazide instead begin amlodipine Restless leg: Controlled on Lyrica samples given for the next 3 months Rheumatoid arthritis: Stable continue sparing use of oxycodone GERD: Decline off of PPI therefore restart Nexium prescriptions provided Rectal bleeding: Strongly recommended that he be seen by gastroenterology for consideration of colonoscopy. He states that he would not be able to afford this procedure at this time. Encouraged to followup in the near future when we have a clean proctoscope.   Return in about 3 months (around 08/10/2013).

## 2013-11-14 ENCOUNTER — Ambulatory Visit (HOSPITAL_COMMUNITY): Payer: Self-pay | Admitting: Psychiatry

## 2014-02-09 ENCOUNTER — Ambulatory Visit: Payer: Self-pay | Admitting: Family Medicine

## 2014-02-13 ENCOUNTER — Encounter: Payer: Self-pay | Admitting: Family Medicine

## 2014-02-13 ENCOUNTER — Ambulatory Visit (INDEPENDENT_AMBULATORY_CARE_PROVIDER_SITE_OTHER): Payer: Self-pay | Admitting: Family Medicine

## 2014-02-13 VITALS — BP 162/100 | HR 77 | Temp 97.8°F | Wt 235.0 lb

## 2014-02-13 DIAGNOSIS — M069 Rheumatoid arthritis, unspecified: Secondary | ICD-10-CM

## 2014-02-13 DIAGNOSIS — I10 Essential (primary) hypertension: Secondary | ICD-10-CM

## 2014-02-13 DIAGNOSIS — B9689 Other specified bacterial agents as the cause of diseases classified elsewhere: Secondary | ICD-10-CM

## 2014-02-13 DIAGNOSIS — J329 Chronic sinusitis, unspecified: Secondary | ICD-10-CM

## 2014-02-13 DIAGNOSIS — A499 Bacterial infection, unspecified: Secondary | ICD-10-CM

## 2014-02-13 MED ORDER — AZITHROMYCIN 250 MG PO TABS: ORAL_TABLET | ORAL | Status: AC

## 2014-02-13 MED ORDER — AMLODIPINE BESYLATE 10 MG PO TABS: ORAL_TABLET | ORAL | Status: DC

## 2014-02-13 MED ORDER — OXYCODONE-ACETAMINOPHEN 5-325 MG PO TABS
1.0000 | ORAL_TABLET | Freq: Four times a day (QID) | ORAL | Status: DC | PRN
Start: 2014-02-13 — End: 2014-04-09

## 2014-02-13 MED ORDER — PREDNISONE 20 MG PO TABS: ORAL_TABLET | ORAL | Status: AC

## 2014-02-13 NOTE — Progress Notes (Signed)
CC: [name] is a 44 y.o. male is here for URI   Subjective: HPI:  Complains of facial pressure localized in the forehead, nasal congestion postnasal drip and nonproductive cough that has been present for the last 2 weeks. Symptoms came on abruptly and have been persistent ever since onset. No improvement with DayQuil or NyQuil products. Nothing seems to make better or worse. Accompanied by subjective fevers and chills. Accompanied by mild shortness of breath. Denies chest pain, blood in sputum, confusion, motor or sensory disturbances other than that described above  Follow-up essential hypertension: He has run out of amlodipine. No formal exercise routine at the present time. There has been no chest pain, orthopnea, peripheral edema. No outside blood pressures to report  Follow-up rheumatoid arthritis: He is requesting a refill on oxycodone/acetaminophen. Continues to have moderate diffuse joint pain, currently unable to afford any medication other than ibuprofen for treatment of rheumatoid arthritis   Review Of Systems Outlined In HPI  Past Medical History  Diagnosis Date  . Pericarditis   . History of lithotripsy   . Rheumatoid arthritis(714.0)   . Gout   . Hypertension   . Kidney stones   . Renal tubular acidosis   . Anxiety   . Nephrolithiasis 12/11/2011  . Lumbar degenerative disc disease 12/11/2011  . Essential hypertension 12/11/2011  . Arthritis   . Depression     Past Surgical History  Procedure Laterality Date  . Hemorroidectomy    . Kidney stone surgery    . Anterior cervical decomp/discectomy fusion     Family History  Problem Relation Age of Onset  . Hypertension Father   . Rheum arthritis Father   . Alcohol abuse Father   . Heart attack Father   . Diabetes Mellitus II Father   . Coronary artery disease Father   . Diabetes Mellitus II Mother   . Cancer - Colon Mother   . Coronary artery disease Mother   . Heart attack Mother   . Ovarian cancer  Mother   . Uterine cancer Mother   . Dementia Neg Hx   . Bipolar disorder Neg Hx   . Depression Neg Hx   . Drug abuse Neg Hx   . OCD Neg Hx     History   Social History  . Marital Status: Legally Separated    Spouse Name: N/A    Number of Children: N/A  . Years of Education: N/A   Occupational History  . Not on file.   Social History Main Topics  . Smoking status: Current Every Day Smoker -- 0.50 packs/day for 29 years  . Smokeless tobacco: Not on file     Comment: Nicotrol  . Alcohol Use: No  . Drug Use: No  . Sexual Activity:    Partners: Female   Other Topics Concern  . Not on file   Social History Narrative     Objective: BP 162/100 mmHg  Pulse 77  Temp(Src) 97.8 F (36.6 C) (Oral)  Wt 235 lb (106.595 kg)  General: Alert and Oriented, No Acute Distress HEENT: Pupils equal, round, reactive to light. Conjunctivae clear.  External ears unremarkable, canals clear with intact TMs with appropriate landmarks.  Middle ear appears open without effusion. Pink inferior turbinates.  Moist mucous membranes, pharynx without inflammation nor lesions however moderate cobblestoning and postnasal drip.  Neck supple without palpable lymphadenopathy nor abnormal masses. Lungs: Clear to auscultation bilaterally, no wheezing/ronchi/rales.  Comfortable work of breathing. Good air movement. Cardiac: Regular rate  and rhythm. Normal S1/S2.  No murmurs, rubs, nor gallops.   Extremities: No peripheral edema.  Strong peripheral pulses.  Mental Status: No depression, anxiety, nor agitation. Skin: Warm and dry.  Assessment & Plan: Mackey was seen today for uri.  Diagnoses and associated orders for this visit:  Bacterial sinusitis - predniSONE (DELTASONE) 20 MG tablet; Three tabs at once daily for five days. - azithromycin (ZITHROMAX) 250 MG tablet; Take two tabs at once on day 1, then one tab daily on days 2-5.  Rheumatoid arthritis - oxyCODONE-acetaminophen (PERCOCET) 5-325 MG per  tablet; Take 1-2 tablets by mouth every 6 (six) hours as needed for moderate pain.  Essential hypertension - amLODipine (NORVASC) 10 MG tablet; One tablet by mouth every day for blood pressure control.    Bacterial sinusitis: Start prednisone burst and azithromycin. Essential hypertension: Uncontrolled chronic condition restart amlodipine Rheumatoid arthritis: Refills of Percocet provided, urged to follow up with rheumatology once financially reasonable.  Return if symptoms worsen or fail to improve.

## 2014-04-09 ENCOUNTER — Other Ambulatory Visit: Payer: Self-pay | Admitting: *Deleted

## 2014-04-09 DIAGNOSIS — M069 Rheumatoid arthritis, unspecified: Secondary | ICD-10-CM

## 2014-04-09 MED ORDER — OXYCODONE-ACETAMINOPHEN 5-325 MG PO TABS: 1.0000 | ORAL_TABLET | Freq: Four times a day (QID) | ORAL | Status: DC | PRN

## 2014-05-10 ENCOUNTER — Ambulatory Visit (INDEPENDENT_AMBULATORY_CARE_PROVIDER_SITE_OTHER): Payer: Self-pay | Admitting: Psychiatry

## 2014-05-10 ENCOUNTER — Encounter (HOSPITAL_COMMUNITY): Payer: Self-pay | Admitting: Psychiatry

## 2014-05-10 DIAGNOSIS — F411 Generalized anxiety disorder: Secondary | ICD-10-CM

## 2014-05-10 DIAGNOSIS — F331 Major depressive disorder, recurrent, moderate: Secondary | ICD-10-CM

## 2014-05-10 DIAGNOSIS — F4323 Adjustment disorder with mixed anxiety and depressed mood: Secondary | ICD-10-CM

## 2014-05-10 MED ORDER — TRAZODONE HCL 50 MG PO TABS: 50.0000 mg | ORAL_TABLET | Freq: Every evening | ORAL | Status: DC | PRN

## 2014-05-10 MED ORDER — HYDROXYZINE HCL 25 MG PO TABS: 50.0000 mg | ORAL_TABLET | Freq: Two times a day (BID) | ORAL | Status: DC | PRN

## 2014-05-10 MED ORDER — FLUOXETINE HCL 40 MG PO CAPS
40.0000 mg | ORAL_CAPSULE | Freq: Two times a day (BID) | ORAL | Status: DC
Start: 2014-05-10 — End: 2014-06-06
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
## 2014-05-14 ENCOUNTER — Ambulatory Visit (INDEPENDENT_AMBULATORY_CARE_PROVIDER_SITE_OTHER): Payer: Self-pay | Admitting: Family Medicine

## 2014-05-14 VITALS — BP 146/97 | HR 80 | Ht 68.0 in | Wt 235.0 lb

## 2014-05-14 DIAGNOSIS — Z0000 Encounter for general adult medical examination without abnormal findings: Secondary | ICD-10-CM

## 2014-05-14 DIAGNOSIS — M069 Rheumatoid arthritis, unspecified: Secondary | ICD-10-CM

## 2014-05-14 MED ORDER — OXYCODONE-ACETAMINOPHEN 5-325 MG PO TABS: 1.0000 | ORAL_TABLET | Freq: Four times a day (QID) | ORAL | Status: DC | PRN

## 2014-05-14 NOTE — Progress Notes (Signed)
Patient came into clinic for a nurse visit, PPD placement and Hep Surface Antigen baseline blood work. Patient states he is getting these test completed to begin treatment on Enbrel for his RA. Patient tolerated PPD placement well on right forearm, no immediate complications. Patient advised to return to clinic for read on Wednesday. Verbalized understanding. Patient requested a refill on his Oxycodone, upon review it was OK to refill at this time. Patient took Rx with him prior to leaving clinic to have his lab work completed. No further questions.

## 2014-05-15 LAB — HEPATITIS B SURFACE ANTIGEN: Hepatitis B Surface Ag: NEGATIVE

## 2014-05-16 ENCOUNTER — Other Ambulatory Visit: Payer: Self-pay | Admitting: Family Medicine

## 2014-05-16 DIAGNOSIS — I10 Essential (primary) hypertension: Secondary | ICD-10-CM

## 2014-05-16 DIAGNOSIS — G2581 Restless legs syndrome: Secondary | ICD-10-CM

## 2014-05-16 LAB — TB SKIN TEST
Induration: 0 mm
TB Skin Test: NEGATIVE

## 2014-05-16 MED ORDER — AMLODIPINE BESYLATE 10 MG PO TABS: ORAL_TABLET | ORAL | Status: DC

## 2014-05-16 MED ORDER — PREGABALIN 50 MG PO CAPS: 50.0000 mg | ORAL_CAPSULE | Freq: Every day | ORAL | Status: DC

## 2014-05-16 MED ORDER — ELETRIPTAN HYDROBROMIDE 40 MG PO TABS: 40.0000 mg | ORAL_TABLET | ORAL | Status: DC | PRN

## 2014-05-16 MED ORDER — ESOMEPRAZOLE MAGNESIUM 40 MG PO CPDR: 40.0000 mg | DELAYED_RELEASE_CAPSULE | Freq: Every day | ORAL | Status: DC

## 2014-05-16 MED ORDER — PROMETHAZINE HCL 25 MG PO TABS: 25.0000 mg | ORAL_TABLET | Freq: Four times a day (QID) | ORAL | Status: DC | PRN

## 2014-05-16 NOTE — Telephone Encounter (Signed)
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 Advised I would send over the meds except for the pain medication.

## 2014-06-06 ENCOUNTER — Encounter (HOSPITAL_COMMUNITY): Payer: Self-pay | Admitting: Psychiatry

## 2014-06-06 ENCOUNTER — Ambulatory Visit (INDEPENDENT_AMBULATORY_CARE_PROVIDER_SITE_OTHER): Payer: Self-pay | Admitting: Psychiatry

## 2014-06-06 VITALS — BP 132/68 | HR 79 | Ht 68.0 in | Wt 232.0 lb

## 2014-06-06 DIAGNOSIS — G4700 Insomnia, unspecified: Secondary | ICD-10-CM

## 2014-06-06 DIAGNOSIS — F0630 Mood disorder due to known physiological condition, unspecified: Secondary | ICD-10-CM

## 2014-06-06 DIAGNOSIS — F331 Major depressive disorder, recurrent, moderate: Secondary | ICD-10-CM

## 2014-06-06 DIAGNOSIS — F4323 Adjustment disorder with mixed anxiety and depressed mood: Secondary | ICD-10-CM

## 2014-06-06 DIAGNOSIS — F411 Generalized anxiety disorder: Secondary | ICD-10-CM

## 2014-06-06 MED ORDER — TRAZODONE HCL 50 MG PO TABS: 50.0000 mg | ORAL_TABLET | Freq: Every evening | ORAL | Status: DC | PRN

## 2014-06-06 MED ORDER — HYDROXYZINE HCL 25 MG PO TABS: ORAL_TABLET | ORAL | Status: DC

## 2014-06-06 MED ORDER — FLUOXETINE HCL 40 MG PO CAPS: 40.0000 mg | ORAL_CAPSULE | Freq: Two times a day (BID) | ORAL | Status: DC

## 2014-06-06 NOTE — Progress Notes (Signed)
Patient ID: [name], male   DOB: 07/09/1970, 44 y.o.   MRN: [id number] [facility] Follow-up Outpatient Visit  [name] [id number] 44 y.o.  06/06/2014  Chief Complaint: follow up for depression.     History of Present Illness:     HPI Comments: [name] is a 44 y/o male with a past psychiatric history significant for symptoms of anxiety and depression. The patient is referred for psychiatric services for medication management.    . Location: The patient has lost his mom in December. Still grieving but not hopeless. The patient is still going through the appeals process for disability says cannot work due to arthritis. Also arthritis keeps him down as it effects his mood.  . Quality: Depression and anxiety fluctuates with pain and day to day stress. Says he at times doe not like himself as he is not working.  He reports he is taking his medications and denies any side effects.   In the area of affective symptoms, patient appears a. Patient denies current suicidal ideation, intent, or plan. Patient denies current homicidal ideation, intent, or plan. Patient denies auditory hallucinations. Patient denies visual hallucinations. Patient denies symptoms of paranoia. Patient states sleep is poor, with approximately 2 -9 hours of sleep per night. Appetite is increased. Energy level is fair to low. Patient endorses some symptoms of anhedonia. Patient endorses hopelessness and helplessness, denies guilt.  Sleep remains poor unless he takes trazadone.  Modifying factors: his fiance is supportive.  . Severity: Depression: 5/10 (0=Very depressed; 5=Neutral; 10=Very Happy)  Anxiety- 6/10 (0=no anxiety; 5= moderate/tolerable anxiety; 10= panic attacks)  . Duration: has had symptoms in his 20's but with medical problems, mood has progressively worsened.   . Timing: Mood still worse midday.  . Context: Finances, Disability process,. Aggravated by current medical  issues. Ruminating about "all the things that are wrong."  . Modifying factors: Improves with spending time with his children.  . Associated signs and symptoms:    Past Medical History  Diagnosis Date  . Pericarditis   . History of lithotripsy   . Rheumatoid arthritis(714.0)   . Gout   . Hypertension   . Kidney stones   . Renal tubular acidosis   . Anxiety   . Nephrolithiasis 12/11/2011  . Lumbar degenerative disc disease 12/11/2011  . Essential hypertension 12/11/2011  . Arthritis   . Depression    Family History  Problem Relation Age of Onset  . Hypertension Father   . Rheum arthritis Father   . Alcohol abuse Father   . Heart attack Father   . Diabetes Mellitus II Father   . Coronary artery disease Father   . Diabetes Mellitus II Mother   . Cancer - Colon Mother   . Coronary artery disease Mother   . Heart attack Mother   . Ovarian cancer Mother   . Uterine cancer Mother   . Dementia Neg Hx   . Bipolar disorder Neg Hx   . Depression Neg Hx   . Drug abuse Neg Hx   . OCD Neg Hx     Outpatient Encounter Prescriptions as of 06/06/2014  Medication Sig  . AMBULATORY NON FORMULARY MEDICATION Medication Name: Gilmer Mor, use as needed.  Dx: Rheumatoid Arthritis  . amLODipine (NORVASC) 10 MG tablet One tablet by mouth every day for blood pressure control.  . cyclobenzaprine (FLEXERIL) 10 MG tablet 10 mg.  . eletriptan (RELPAX) 40 MG tablet Take 1 tablet (40 mg total) by mouth as needed  for migraine or headache. One tablet by mouth at onset of headache. May repeat in 2 hours if headache persists or recurs.  Marland Kitchen esomeprazole (NEXIUM) 40 MG capsule Take 1 capsule (40 mg total) by mouth daily.  Marland Kitchen FLUoxetine (PROZAC) 40 MG capsule Take 1 capsule (40 mg total) by mouth 2 (two) times daily.  . folic acid (FOLVITE) 1 MG tablet 1 mg.  . hydrOXYzine (ATARAX/VISTARIL) 25 MG tablet Take one to two prn for anxiety  . oxyCODONE-acetaminophen (PERCOCET) 5-325 MG per tablet Take 1-2 tablets  by mouth every 6 (six) hours as needed for moderate pain.  . pregabalin (LYRICA) 50 MG capsule Take 1 capsule (50 mg total) by mouth daily.  . promethazine (PHENERGAN) 25 MG tablet Take 1 tablet (25 mg total) by mouth every 6 (six) hours as needed for nausea.  . traZODone (DESYREL) 50 MG tablet Take 1 tablet (50 mg total) by mouth at bedtime as needed for sleep. Take one-half to two tablets at bedtime.  . [DISCONTINUED] FLUoxetine (PROZAC) 40 MG capsule Take 1 capsule (40 mg total) by mouth 2 (two) times daily.  . [DISCONTINUED] hydrOXYzine (ATARAX/VISTARIL) 25 MG tablet Take 2 tablets (50 mg total) by mouth 2 (two) times daily as needed for anxiety.  . [DISCONTINUED] traZODone (DESYREL) 50 MG tablet Take 1 tablet (50 mg total) by mouth at bedtime as needed for sleep. Take one-half to two tablets at bedtime.   No facility-administered encounter medications on file as of 06/06/2014.    Recent Results (from the past 2160 hour(s))  Hepatitis B surface antigen     Status: None   Collection Time: 05/14/14  9:39 AM  Result Value Ref Range   Hepatitis B Surface Ag NEGATIVE NEGATIVE  PPD     Status: None   Collection Time: 05/16/14  3:41 PM  Result Value Ref Range   TB Skin Test Negative    Induration 0 mm    BP 132/68 mmHg  Pulse 79  Ht 5\' 8"  (1.727 m)  Wt 232 lb (105.235 kg)  BMI 35.28 kg/m2  SpO2 93%   Review of Systems  Constitutional: Negative.   Musculoskeletal: Positive for joint pain.  Skin: Negative for rash.  Neurological: Negative for headaches.  Psychiatric/Behavioral: Positive for depression.    Mental Status Examination  Appearance: casual and cooperative Alert: Yes Attention: fair  Cooperative: Yes Eye Contact: Good Speech: coherent Psychomotor Activity: Normal Memory/Concentration: adequate Oriented: person, place, time/date and situation Mood: Dysphoric at times. Not hopeless Affect: Constricted Thought Processes and Associations: Coherent Fund of  Knowledge: Fair Thought Content: Suicidal ideation and Homicidal ideation were denied. Denies hallucinations Insight: Fair Judgement: Fair  Diagnosis: Major depressive disorder recurrent moderate to severe. Generalized anxiety disorder. Insomnia. 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 He needs a 90 day supply for depression and anxiety.  Continue Vistaril 1 tablet to 2 tablets when necessary for anxiety and stress.  Continue trazodone 50 mg when necessary for sleep or insomnia. In regarding to arthritis he is, be taking IM injection every week and that'll help his pain. His pain does affect his depression  Pertinent Labs and Relevant Prior Notes reviewed. Medication Side effects, benefits and risks reviewed/discussed with Patient. Time given for patient to respond and asks questions regarding the Diagnosis and Medications. Safety concerns and to report to ER if suicidal or call 911. Relevant Medications refilled or called in to pharmacy. Discussed weight maintenance and Sleep Hygiene.  Follow up with Primary care provider in regards to Medical conditions. Recommend compliance with medications and follow up office appointments. Discussed to avail opportunity to consider or/and continue Individual therapy with Counselor. Greater than 50% of time was spend in counseling and coordination of care with the patient.  Schedule for Follow up visit in 2 months.  or call in earlier as necessary.  Thresa Ross, MD 06/06/2014

## 2014-06-07 ENCOUNTER — Ambulatory Visit (HOSPITAL_COMMUNITY): Payer: Self-pay | Admitting: Psychiatry

## 2014-07-24 ENCOUNTER — Other Ambulatory Visit: Payer: Self-pay | Admitting: *Deleted

## 2014-07-24 DIAGNOSIS — M069 Rheumatoid arthritis, unspecified: Secondary | ICD-10-CM

## 2014-07-24 MED ORDER — ESOMEPRAZOLE MAGNESIUM 40 MG PO CPDR: 40.0000 mg | DELAYED_RELEASE_CAPSULE | Freq: Every day | ORAL | Status: DC

## 2014-07-24 MED ORDER — OXYCODONE-ACETAMINOPHEN 5-325 MG PO TABS: 1.0000 | ORAL_TABLET | Freq: Four times a day (QID) | ORAL | Status: DC | PRN

## 2014-09-06 ENCOUNTER — Ambulatory Visit (INDEPENDENT_AMBULATORY_CARE_PROVIDER_SITE_OTHER): Payer: Self-pay | Admitting: Psychiatry

## 2014-09-06 ENCOUNTER — Encounter (HOSPITAL_COMMUNITY): Payer: Self-pay | Admitting: Psychiatry

## 2014-09-06 VITALS — BP 128/80 | HR 82 | Ht 68.0 in | Wt 232.0 lb

## 2014-09-06 DIAGNOSIS — F39 Unspecified mood [affective] disorder: Secondary | ICD-10-CM

## 2014-09-06 DIAGNOSIS — G4700 Insomnia, unspecified: Secondary | ICD-10-CM

## 2014-09-06 DIAGNOSIS — F411 Generalized anxiety disorder: Secondary | ICD-10-CM

## 2014-09-06 DIAGNOSIS — F331 Major depressive disorder, recurrent, moderate: Secondary | ICD-10-CM

## 2014-09-06 MED ORDER — TRAZODONE HCL 50 MG PO TABS: 50.0000 mg | ORAL_TABLET | Freq: Every evening | ORAL | Status: DC | PRN

## 2014-09-06 MED ORDER — FLUOXETINE HCL 40 MG PO CAPS: 40.0000 mg | ORAL_CAPSULE | Freq: Two times a day (BID) | ORAL | Status: DC

## 2014-09-06 NOTE — Progress Notes (Signed)
Patient ID: [name], male   DOB: Dec 11, 1970, 44 y.o.   MRN: [id number] [name] Follow-up Outpatient Visit  BRENTT FREAD [id number] 44 y.o.  09/06/2014  Chief Complaint: follow up for depression.     History of Present Illness:     HPI Comments: [name] is a 44 y/o male with a past psychiatric history significant for symptoms of anxiety and depression. The patient is referred for psychiatric services for medication management.    . Location: The patient has lost his mom in December. Still grieving but not hopeless. The patient is still going through the appeals process for disability says cannot work due to arthritis. Also arthritis keeps him down as it effects his mood.  . Quality: Depression and anxiety fluctuates with pain and day to day stress. Says he at times doe not like himself as he is not working.  He reports he is taking his medications and denies any side effects.   In the area of affective symptoms, patient appears a. Patient denies current suicidal ideation, intent, or plan. Patient denies current homicidal ideation, intent, or plan. Patient denies auditory hallucinations. Patient denies visual hallucinations. Patient denies symptoms of paranoia. Patient states sleep is poor, with approximately 2 -9 hours of sleep per night. Appetite is increased. Energy level is fair to low. Patient endorses some symptoms of anhedonia. Patient endorses hopelessness and helplessness, denies guilt.  Sleep remains poor unless he takes trazadone.  Modifying factors: his fiance is supportive.  . Severity: Depression: 6/10 (0=Very depressed; 5=Neutral; 10=Very Happy)  Anxiety- 6/10 (0=no anxiety; 5= moderate/tolerable anxiety; 10= panic attacks)  . Duration: has had symptoms in his 20's but with medical problems, mood has progressively worsened.   . Timing: Mood still worse midday.  . Context: Finances, Disability process,. Aggravated by current medical  issues. Ruminating about "all the things that are wrong."  . Modifying factors: Improves with spending time with his children.  . Associated signs and symptoms:    Past Medical History  Diagnosis Date  . Pericarditis   . History of lithotripsy   . Rheumatoid arthritis(714.0)   . Gout   . Hypertension   . Kidney stones   . Renal tubular acidosis   . Anxiety   . Nephrolithiasis 12/11/2011  . Lumbar degenerative disc disease 12/11/2011  . Essential hypertension 12/11/2011  . Arthritis   . Depression    Family History  Problem Relation Age of Onset  . Hypertension Father   . Rheum arthritis Father   . Alcohol abuse Father   . Heart attack Father   . Diabetes Mellitus II Father   . Coronary artery disease Father   . Diabetes Mellitus II Mother   . Cancer - Colon Mother   . Coronary artery disease Mother   . Heart attack Mother   . Ovarian cancer Mother   . Uterine cancer Mother   . Dementia Neg Hx   . Bipolar disorder Neg Hx   . Depression Neg Hx   . Drug abuse Neg Hx   . OCD Neg Hx     Outpatient Encounter Prescriptions as of 09/06/2014  Medication Sig  . AMBULATORY NON FORMULARY MEDICATION Medication Name: Gilmer Mor, use as needed.  Dx: Rheumatoid Arthritis  . amLODipine (NORVASC) 10 MG tablet One tablet by mouth every day for blood pressure control.  . cyclobenzaprine (FLEXERIL) 10 MG tablet 10 mg.  . eletriptan (RELPAX) 40 MG tablet Take 1 tablet (40 mg total) by mouth as needed  for migraine or headache. One tablet by mouth at onset of headache. May repeat in 2 hours if headache persists or recurs.  Marland Kitchen esomeprazole (NEXIUM) 40 MG capsule Take 1 capsule (40 mg total) by mouth daily.  Marland Kitchen FLUoxetine (PROZAC) 40 MG capsule Take 1 capsule (40 mg total) by mouth 2 (two) times daily.  . folic acid (FOLVITE) 1 MG tablet 1 mg.  . hydrOXYzine (ATARAX/VISTARIL) 25 MG tablet Take one to two prn for anxiety  . oxyCODONE-acetaminophen (PERCOCET) 5-325 MG per tablet Take 1-2 tablets  by mouth every 6 (six) hours as needed for moderate pain.  . pregabalin (LYRICA) 50 MG capsule Take 1 capsule (50 mg total) by mouth daily.  . promethazine (PHENERGAN) 25 MG tablet Take 1 tablet (25 mg total) by mouth every 6 (six) hours as needed for nausea.  . traZODone (DESYREL) 50 MG tablet Take 1 tablet (50 mg total) by mouth at bedtime as needed for sleep. Take one-half to two tablets at bedtime.  . [DISCONTINUED] FLUoxetine (PROZAC) 40 MG capsule Take 1 capsule (40 mg total) by mouth 2 (two) times daily.  . [DISCONTINUED] traZODone (DESYREL) 50 MG tablet Take 1 tablet (50 mg total) by mouth at bedtime as needed for sleep. Take one-half to two tablets at bedtime.   No facility-administered encounter medications on file as of 09/06/2014.    No results found for this or any previous visit (from the past 2160 hour(s)).  Ht 5\' 8"  (1.727 m)  Wt 232 lb (105.235 kg)  BMI 35.28 kg/m2   Review of Systems  Constitutional: Negative.   Musculoskeletal: Positive for joint pain.  Skin: Negative for rash.  Neurological: Negative for headaches.  Psychiatric/Behavioral: Positive for depression. Negative for suicidal ideas.    Mental Status Examination  Appearance: casual and cooperative Alert: Yes Attention: fair  Cooperative: Yes Eye Contact: Good Speech: coherent Psychomotor Activity: Normal Memory/Concentration: adequate Oriented: person, place, time/date and situation Mood: Dysphoric at times. Not hopeless Affect: Constricted Thought Processes and Associations: Coherent Fund of Knowledge: Fair Thought Content: Suicidal ideation and Homicidal ideation were denied. Denies hallucinations Insight: Fair Judgement: Fair  Diagnosis: Major depressive disorder recurrent moderate to severe. Generalized anxiety disorder. Insomnia. 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 He needs a 90 day supply for depression and  anxiety.  Continue Vistaril 1 tablet to 2 tablets when necessary for anxiety and stress. Has refill  Continue trazodone 50 mg when necessary for sleep or insomnia. Prescription given.  In regarding to arthritis he is, be taking IM injection every week and that'll help his pain. His pain does affect his depression Mood also effected by pain. He is currently taking pain meds.  Pertinent Labs and Relevant Prior Notes reviewed. Medication Side effects, benefits and risks reviewed/discussed with Patient. Time given for patient to respond and asks questions regarding the Diagnosis and Medications. Safety concerns and to report to ER if suicidal or call 911. Relevant Medications refilled or called in to pharmacy. Discussed weight maintenance and Sleep Hygiene. Follow up with Primary care provider in regards to Medical conditions. Recommend compliance with medications and follow up office appointments. Discussed to avail opportunity to consider or/and continue Individual therapy with Counselor. Greater than 50% of time was spend in counseling and coordination of care with the patient.  Schedule for Follow up visit in 2 months.  or call in earlier as necessary.  , MD 09/06/2014

## 2014-10-02 ENCOUNTER — Encounter: Payer: Self-pay | Admitting: Family Medicine

## 2014-10-02 ENCOUNTER — Ambulatory Visit (INDEPENDENT_AMBULATORY_CARE_PROVIDER_SITE_OTHER): Payer: Self-pay | Admitting: Family Medicine

## 2014-10-02 VITALS — BP 150/102 | HR 69 | Wt 230.0 lb

## 2014-10-02 DIAGNOSIS — M069 Rheumatoid arthritis, unspecified: Secondary | ICD-10-CM

## 2014-10-02 DIAGNOSIS — M5030 Other cervical disc degeneration, unspecified cervical region: Secondary | ICD-10-CM

## 2014-10-02 DIAGNOSIS — Z23 Encounter for immunization: Secondary | ICD-10-CM

## 2014-10-02 DIAGNOSIS — I10 Essential (primary) hypertension: Secondary | ICD-10-CM

## 2014-10-02 MED ORDER — OXYCODONE-ACETAMINOPHEN 5-325 MG PO TABS: 1.0000 | ORAL_TABLET | Freq: Four times a day (QID) | ORAL | Status: DC | PRN

## 2014-10-02 MED ORDER — ELETRIPTAN HYDROBROMIDE 40 MG PO TABS
40.0000 mg | ORAL_TABLET | ORAL | Status: DC | PRN
Start: 2014-10-02 — End: 2015-05-20

## 2014-10-02 MED ORDER — ESOMEPRAZOLE MAGNESIUM 40 MG PO CPDR: 40.0000 mg | DELAYED_RELEASE_CAPSULE | Freq: Every day | ORAL | Status: DC

## 2014-10-02 MED ORDER — AMLODIPINE BESYLATE 10 MG PO TABS: ORAL_TABLET | ORAL | Status: DC

## 2014-10-02 MED ORDER — CYCLOBENZAPRINE HCL 10 MG PO TABS: 10.0000 mg | ORAL_TABLET | Freq: Three times a day (TID) | ORAL | Status: DC | PRN

## 2014-10-02 MED ORDER — HYDROCHLOROTHIAZIDE 25 MG PO TABS: ORAL_TABLET | ORAL | Status: DC

## 2014-10-02 NOTE — Progress Notes (Signed)
CC: [name] is a 44 y.o. male is here for f/u meds   Subjective: HPI:  Follow-up rheumatoid arthritis: Recently started on Enbrel and he believes it is helping to moderate degree. He still has occasional moderate to severe joint pain in knees or the shoulders and is currently taking Percocet which is beneficial for when the pain flares up. He is requesting a refill of this and also for cyclobenzaprine which helps with muscle cramps occur in conjunction with his pain. Swelling of the knees and shoulders has improved since starting Enbrel. His rheumatologist has left her practice and he has not been set up with anybody else to help manage his rheumatoid arthritis.  Follow essential hypertension: Last month when checked at his rheumatologist office it was in the stage I hypertension range. No outside blood pressures other than that to report. No chest pain shortness of breath orthopnea nor peripheral edema. Taking amlodipine daily.  Degenerative disc disease of the cervical region: He tells me that Flexeril and hydrocodone seem to help. If the midline in the neck on the back of the neck and nonradiating. Worse with lifting heavy objects. No new motor or sensory disturbances   Review Of Systems Outlined In HPI  Past Medical History  Diagnosis Date  . Pericarditis   . History of lithotripsy   . Rheumatoid arthritis(714.0)   . Gout   . Hypertension   . Kidney stones   . Renal tubular acidosis   . Anxiety   . Nephrolithiasis 12/11/2011  . Lumbar degenerative disc disease 12/11/2011  . Essential hypertension 12/11/2011  . Arthritis   . Depression     Past Surgical History  Procedure Laterality Date  . Hemorroidectomy    . Kidney stone surgery    . Anterior cervical decomp/discectomy fusion     Family History  Problem Relation Age of Onset  . Hypertension Father   . Rheum arthritis Father   . Alcohol abuse Father   . Heart attack Father   . Diabetes Mellitus II Father   .  Coronary artery disease Father   . Diabetes Mellitus II Mother   . Cancer - Colon Mother   . Coronary artery disease Mother   . Heart attack Mother   . Ovarian cancer Mother   . Uterine cancer Mother   . Dementia Neg Hx   . Bipolar disorder Neg Hx   . Depression Neg Hx   . Drug abuse Neg Hx   . OCD Neg Hx     Social History   Social History  . Marital Status: Legally Separated    Spouse Name: N/A  . Number of Children: N/A  . Years of Education: N/A   Occupational History  . Not on file.   Social History Main Topics  . Smoking status: Current Every Day Smoker -- 0.50 packs/day for 29 years  . Smokeless tobacco: Not on file     Comment: Nicotrol  . Alcohol Use: No  . Drug Use: No  . Sexual Activity:    Partners: Female   Other Topics Concern  . Not on file   Social History Narrative     Objective: BP 150/102 mmHg  Pulse 69  Wt 230 lb (104.327 kg)  General: Alert and Oriented, No Acute Distress HEENT: Pupils equal, round, reactive to light. Conjunctivae clear.  External ears unremarkable, canals clear with intact TMs with appropriate landmarks.  Middle ear appears open without effusion. Pink inferior turbinates.  Moist mucous membranes, pharynx without  inflammation nor lesions.  Neck supple without palpable lymphadenopathy nor abnormal masses. Lungs: Clear to auscultation bilaterally, no wheezing/ronchi/rales.  Comfortable work of breathing. Good air movement. Cardiac: Regular rate and rhythm. Normal S1/S2.  No murmurs, rubs, nor gallops.   Extremities: No peripheral edema.  Strong peripheral pulses.  Mental Status: No depression, anxiety, nor agitation. Skin: Warm and dry.  Assessment & Plan: [name] was seen today for f/u meds.  Diagnoses and all orders for this visit:  Rheumatoid arthritis -     oxyCODONE-acetaminophen (PERCOCET) 5-325 MG per tablet; Take 1-2 tablets by mouth every 6 (six) hours as needed for moderate pain. -     Ambulatory referral to  Rheumatology  Essential hypertension -     amLODipine (NORVASC) 10 MG tablet; One tablet by mouth every day for blood pressure control.  Degenerative disc disease, cervical  Encounter for immunization  Other orders -     Discontinue: esomeprazole (NEXIUM) 40 MG capsule; Take 1 capsule (40 mg total) by mouth daily. -     Discontinue: cyclobenzaprine (FLEXERIL) 10 MG tablet; Take 1 tablet (10 mg total) by mouth 3 (three) times daily as needed for muscle spasms. -     Discontinue: hydrochlorothiazide (HYDRODIURIL) 25 MG tablet; One tablet by mouth every morning for blood pressure control. -     Flu Vaccine QUAD 36+ mos IM -     cyclobenzaprine (FLEXERIL) 10 MG tablet; Take 1 tablet (10 mg total) by mouth 3 (three) times daily as needed for muscle spasms. -     eletriptan (RELPAX) 40 MG tablet; Take 1 tablet (40 mg total) by mouth as needed for migraine or headache. May repeat in 2 hours if headache persists or recurs. -     esomeprazole (NEXIUM) 40 MG capsule; Take 1 capsule (40 mg total) by mouth daily. -     hydrochlorothiazide (HYDRODIURIL) 25 MG tablet; One tablet by mouth every morning for blood pressure control.   Rheumatoid arthritis: Currently controlled with Enbrel and methotrexate with occasional Percocet use. Refills provided along with cyclobenzaprine. Referral has been place for him to establish with a new rheumatologist. Central hypertension: Uncontrolled chronic condition continue amlodipine and adding HCTZ Degenerative disc disease: Currently controlled with Percocet and cyclobenzaprine. He is requesting refills on Relpax and Nexium to be sent to the [facility] program which gives him this for free.  Follow-up one month for nurse visit blood pressure check   Return if symptoms worsen or fail to improve.

## 2014-10-19 ENCOUNTER — Other Ambulatory Visit: Payer: Self-pay | Admitting: *Deleted

## 2014-10-19 MED ORDER — ESOMEPRAZOLE MAGNESIUM 40 MG PO CPDR: 40.0000 mg | DELAYED_RELEASE_CAPSULE | Freq: Every day | ORAL | Status: DC

## 2014-10-26 ENCOUNTER — Telehealth: Payer: Self-pay | Admitting: *Deleted

## 2014-10-26 DIAGNOSIS — M069 Rheumatoid arthritis, unspecified: Secondary | ICD-10-CM

## 2014-10-26 NOTE — Telephone Encounter (Signed)
Pt would like for you to send another rx besides [facility] because [facility] no longer carry nexium.   Also pt would like a referral to another rheumatologist who is closer in the area. Pt does have an appt set in November in [place] area with a  Rheumatologist but would like to be closer to the [place] area. Pt also has papers that need to be filled out that basically say it's ok to continue the enbrel . Since Dr. Roselle Locus is no longer seeing this patient and it will not be until November until patients sees the other rheum, he wants to know if Dr. Ivan Anchors would fill out these forms.the forms will ensure that Medicaid will not cut pt's assistance for the enbrel. I did tell the pt to drop the forms off and Dr. Ivan Anchors could review and that I didn't think that should be a problem but would let them know

## 2014-10-26 NOTE — Telephone Encounter (Signed)
Referral has been placed for different rheumatologist

## 2014-11-01 ENCOUNTER — Telehealth: Payer: Self-pay | Admitting: *Deleted

## 2014-11-01 ENCOUNTER — Ambulatory Visit (INDEPENDENT_AMBULATORY_CARE_PROVIDER_SITE_OTHER): Payer: Self-pay | Admitting: Family Medicine

## 2014-11-01 ENCOUNTER — Encounter: Payer: Self-pay | Admitting: Family Medicine

## 2014-11-01 VITALS — BP 142/91

## 2014-11-01 DIAGNOSIS — I10 Essential (primary) hypertension: Secondary | ICD-10-CM

## 2014-11-01 MED ORDER — HYDROCHLOROTHIAZIDE 25 MG PO TABS: ORAL_TABLET | ORAL | Status: DC

## 2014-11-01 MED ORDER — RANITIDINE HCL 150 MG PO TABS: ORAL_TABLET | ORAL | Status: DC

## 2014-11-01 NOTE — Progress Notes (Signed)
   Subjective:    Patient ID: [name], male    DOB: [birth year], 44 y.o.   MRN: [id number]  HPI  [name] is here for a BP check.  Denies any headaches, dizziness  Review of Systems     Objective:   Physical Exam        Assessment & Plan:   [name]'s BP on 10/02/14 was 150/102 and today's is 142/91.

## 2014-11-01 NOTE — Progress Notes (Signed)
Sue Lush, Will you please let patient know that his BP is improving, I"d recommend he take 1 and one half tablets of hydrochlorothiazide daily, this was the new medication rxed last month.  This will likely get him to his goal BP. A new rx is in your in box.

## 2014-11-01 NOTE — Telephone Encounter (Signed)
The only alternative that I see on the [place] [facility] web site is ranitidine, I've sent a Rx for this to [facility]

## 2014-11-01 NOTE — Telephone Encounter (Signed)
Pt.notified

## 2014-11-01 NOTE — Telephone Encounter (Signed)
Pt would like another medication sent to the [facility] other than nexium because the pharmacy no longer carries it

## 2014-11-02 ENCOUNTER — Telehealth: Payer: Self-pay | Admitting: *Deleted

## 2014-11-02 NOTE — Telephone Encounter (Signed)
Pt.notified

## 2014-11-02 NOTE — Telephone Encounter (Signed)
-----   Message from [place], [place] sent at 11/01/2014 10:22 AM EDT ----- Sue Lush, Will you please let patient know that his BP is improving, I"d recommend he take 1 and one half tablets of hydrochlorothiazide daily, this was the new medication rxed last month.  This will likely get him to his goal BP. A new rx is in your in box.

## 2014-11-12 ENCOUNTER — Encounter (HOSPITAL_COMMUNITY): Payer: Self-pay | Admitting: *Deleted

## 2014-11-12 ENCOUNTER — Emergency Department (HOSPITAL_COMMUNITY)
Admission: EM | Admit: 2014-11-12 | Discharge: 2014-11-12 | Disposition: A | Discharge status: Home / Self Care | Payer: Self-pay | Attending: Emergency Medicine | Admitting: Emergency Medicine

## 2014-11-12 DIAGNOSIS — Z87448 Personal history of other diseases of urinary system: Secondary | ICD-10-CM | POA: Insufficient documentation

## 2014-11-12 DIAGNOSIS — IMO0002 Reserved for concepts with insufficient information to code with codable children: Secondary | ICD-10-CM

## 2014-11-12 DIAGNOSIS — Y9289 Other specified places as the place of occurrence of the external cause: Secondary | ICD-10-CM | POA: Insufficient documentation

## 2014-11-12 DIAGNOSIS — I10 Essential (primary) hypertension: Secondary | ICD-10-CM | POA: Insufficient documentation

## 2014-11-12 DIAGNOSIS — Z8739 Personal history of other diseases of the musculoskeletal system and connective tissue: Secondary | ICD-10-CM | POA: Insufficient documentation

## 2014-11-12 DIAGNOSIS — Y998 Other external cause status: Secondary | ICD-10-CM | POA: Insufficient documentation

## 2014-11-12 DIAGNOSIS — Z79899 Other long term (current) drug therapy: Secondary | ICD-10-CM | POA: Insufficient documentation

## 2014-11-12 DIAGNOSIS — Y9389 Activity, other specified: Secondary | ICD-10-CM | POA: Insufficient documentation

## 2014-11-12 DIAGNOSIS — Z720 Tobacco use: Secondary | ICD-10-CM | POA: Insufficient documentation

## 2014-11-12 DIAGNOSIS — S51812A Laceration without foreign body of left forearm, initial encounter: Secondary | ICD-10-CM | POA: Insufficient documentation

## 2014-11-12 DIAGNOSIS — Z87442 Personal history of urinary calculi: Secondary | ICD-10-CM | POA: Insufficient documentation

## 2014-11-12 DIAGNOSIS — F329 Major depressive disorder, single episode, unspecified: Secondary | ICD-10-CM | POA: Insufficient documentation

## 2014-11-12 MED ORDER — ONDANSETRON 4 MG PO TBDP
4.0000 mg | ORAL_TABLET | Freq: Once | ORAL | Status: AC
  Administered 2014-11-12: 4 mg via ORAL
  Filled 2014-11-12: qty 1

## 2014-11-12 MED ORDER — LIDOCAINE-EPINEPHRINE (PF) 2 %-1:200000 IJ SOLN
10.0000 mL | Freq: Once | INTRAMUSCULAR | Status: AC
  Administered 2014-11-12: 10 mL
  Filled 2014-11-12: qty 20

## 2014-11-12 NOTE — ED Notes (Signed)
Declined W/C at D/C and was escorted to lobby by RN. 

## 2014-11-12 NOTE — Discharge Instructions (Signed)
Please follow-up with your doctor in one week for wound recheck. Return to ED in 10 days to have sutures removed. Take Motrin for discomfort. Return to ED for worsening symptoms.

## 2014-11-12 NOTE — ED Provider Notes (Signed)
CSN: [id number]     Arrival date & time 11/12/14  1232 History  By signing my name below, I, [name], attest that this documentation has been prepared under the direction and in the presence of [name], PA-C Electronically Signed: Jarvis [name], ED Scribe. 11/12/2014. 4:15 PM.    Chief Complaint  Patient presents with  . Laceration    The history is provided by the patient. No language interpreter was used.    HPI Comments: [name] is a 44 y.o. male with a h/o HTN who presents to the Emergency Department complaining of a laceration to his left forearm that occurred 1.5 hours ago that was cut on a metal t- post. Pt reports associated throbbing pain in his left forearm around the wound site. Rates pain is 9/10. He states he has not washed the wound since the incident. He denies any foreign bodies in the wound. There is no active bleeding at this time. Pt reports his last tetanus vaccination was 2 years ago. He has not had any meds PTA. Pt denies being on any anticoagulants. He states he is still able to move the fingers in his left hand. He denies any h/o DM. Pt denies any numbness or weakness in his left fingers.   Past Medical History  Diagnosis Date  . Pericarditis   . History of lithotripsy   . Rheumatoid arthritis(714.0)   . Gout   . Hypertension   . Kidney stones   . Renal tubular acidosis   . Anxiety   . Nephrolithiasis 12/11/2011  . Lumbar degenerative disc disease 12/11/2011  . Essential hypertension 12/11/2011  . Arthritis   . Depression    Past Surgical History  Procedure Laterality Date  . Hemorroidectomy    . Kidney stone surgery    . Anterior cervical decomp/discectomy fusion     Family History  Problem Relation Age of Onset  . Hypertension Father   . Rheum arthritis Father   . Alcohol abuse Father   . Heart attack Father   . Diabetes Mellitus II Father   . Coronary artery disease Father   . Diabetes Mellitus II Mother   . Cancer -  Colon Mother   . Coronary artery disease Mother   . Heart attack Mother   . Ovarian cancer Mother   . Uterine cancer Mother   . Dementia Neg Hx   . Bipolar disorder Neg Hx   . Depression Neg Hx   . Drug abuse Neg Hx   . OCD Neg Hx    Social History  Substance Use Topics  . Smoking status: Current Every Day Smoker -- 0.50 packs/day for 29 years  . Smokeless tobacco: None     Comment: Nicotrol  . Alcohol Use: No    Review of Systems  Skin: Positive for wound.  Neurological: Negative for weakness and numbness.  All other systems reviewed and are negative.     Allergies  Benadryl; Benzocaine; and Lisinopril  Home Medications   Prior to Admission medications   Medication Sig Start Date End Date Taking? Authorizing Provider  AMBULATORY NON FORMULARY MEDICATION Medication Name: Gilmer Mor, use as needed.  Dx: Rheumatoid Arthritis 05/11/13   Laren Boom, DO  amLODipine (NORVASC) 10 MG tablet One tablet by mouth every day for blood pressure control. 10/02/14 10/02/15  Laren Boom, DO  cyclobenzaprine (FLEXERIL) 10 MG tablet Take 1 tablet (10 mg total) by mouth 3 (three) times daily as needed for muscle spasms. 10/02/14   Laren Boom,  DO  eletriptan (RELPAX) 40 MG tablet Take 1 tablet (40 mg total) by mouth as needed for migraine or headache. May repeat in 2 hours if headache persists or recurs. 10/02/14   Sean Hommel, DO  esomeprazole (NEXIUM) 40 MG capsule Take 1 capsule (40 mg total) by mouth daily. 10/19/14   Laren Boom, DO  Etanercept (ENBREL [place]) Inject into the skin once a week.    Historical Provider, MD  FLUoxetine (PROZAC) 40 MG capsule Take 1 capsule (40 mg total) by mouth 2 (two) times daily. 09/06/14   Thresa Ross, MD  folic acid (FOLVITE) 1 MG tablet 1 mg. 10/25/12   Historical Provider, MD  hydrochlorothiazide (HYDRODIURIL) 25 MG tablet One and one half tablet by mouth every morning for blood pressure control. 11/01/14 11/01/15  Laren Boom, DO  hydrOXYzine (ATARAX/VISTARIL) 25 MG  tablet Take one to two prn for anxiety 06/06/14   Thresa Ross, MD  oxyCODONE-acetaminophen (PERCOCET) 5-325 MG per tablet Take 1-2 tablets by mouth every 6 (six) hours as needed for moderate pain. 10/02/14   Sean Hommel, DO  pregabalin (LYRICA) 50 MG capsule Take 1 capsule (50 mg total) by mouth daily. 05/16/14   Laren Boom, DO  promethazine (PHENERGAN) 25 MG tablet Take 1 tablet (25 mg total) by mouth every 6 (six) hours as needed for nausea. 05/16/14   Sean Hommel, DO  ranitidine (ZANTAC) 150 MG tablet One by mouth twice a day to prevent gastric reflux symptoms. 11/01/14 11/01/15  Laren Boom, DO  traZODone (DESYREL) 50 MG tablet Take 1 tablet (50 mg total) by mouth at bedtime as needed for sleep. Take one-half to two tablets at bedtime. 09/06/14   Thresa Ross, MD   Triage Vitals: BP 133/83 mmHg  Pulse 89  Temp(Src) 98.4 F (36.9 C) (Oral)  Resp 18  Ht 5\' 9"  (1.753 m)  Wt 230 lb (104.327 kg)  BMI 33.95 kg/m2  SpO2 95%  Physical Exam  Constitutional: He is oriented to person, place, and time. He appears well-developed and well-nourished. No distress.  HENT:  Head: Normocephalic and atraumatic.  Eyes: Conjunctivae and EOM are normal.  Neck: Neck supple. No tracheal deviation present.  Cardiovascular: Normal rate, regular rhythm and normal heart sounds.   Pulmonary/Chest: Effort normal and breath sounds normal. No respiratory distress.  Abdominal: Soft. There is no tenderness.  Musculoskeletal: Normal range of motion.  Neurological: He is alert and oriented to person, place, and time.  Skin: Skin is warm and dry. Laceration noted.  3cm linear laceration on the left forearm volar aspect medial side; hemostasis achieved PTA.  No tendon involvement. No obvious foreign bodies identified.   Psychiatric: He has a normal mood and affect. His behavior is normal.  Nursing note and vitals reviewed.   ED Course  Procedures (including critical care time)  DIAGNOSTIC STUDIES: Oxygen Saturation  is 95% on RA, normal by my interpretation.    COORDINATION OF CARE: LACERATION REPAIR PROCEDURE NOTE The patient's identification was confirmed and consent was obtained. This procedure was performed by . , PA-Cat 4:15 PM. Site: left forearm volar aspect medial side Sterile procedures observed Anesthetic used (type and amt): lidocaine 2% with epi  Suture type/size: 4-0 prolene Length: 3 # of Sutures: 5 Technique:simple interrupted Complexity: complex Antibx ointment applied Tetanus UTD  Site anesthetized, irrigated with NS, explored without evidence of foreign body, wound well approximated, site covered with dry, sterile dressing.  Patient tolerated procedure well without complications. Instructions for care discussed verbally and patient provided  with additional written instructions for homecare and f/u.    Labs Review Labs Reviewed - No data to display  Imaging Review No results found. I have personally reviewed and evaluated these images and lab results as part of my medical decision-making.   EKG Interpretation None     Meds given in ED:  Medications  lidocaine-EPINEPHrine (XYLOCAINE W/EPI) 2 %-1:200000 (PF) injection 10 mL (10 mLs Infiltration Given 11/12/14 1349)  ondansetron (ZOFRAN-ODT) disintegrating tablet 4 mg (4 mg Oral Given 11/12/14 1352)    Discharge Medication List as of 11/12/2014  2:54 PM     Filed Vitals:   11/12/14 1254 11/12/14 1506  BP: 133/83 126/83  Pulse: 89 68  Temp: 98.4 F (36.9 C) 97.5 F (36.4 C)  TempSrc: Oral Oral  Resp: 18 18  Height: 5\' 9"  (1.753 m)   Weight: 230 lb (104.327 kg)   SpO2: 95% 95%    MDM  ALDYN SWENOR is a 44 y.o. male who comes in for evaluation after a laceration on a T post today. Physical exam shows approximately 3 cm laceration to left forearm. Remains neurovascularly intact. Full active range of motion. Tetanus is up-to-date. Laceration repair by myself at bedside. Patient no history of  immunocompromise state. Return in 10 days for suture removal. No evidence of other acute or emergent pathology at this time. Patient appears well, hemodynamically stable with normal vital signs and is appropriate for discharge. Final diagnoses:  Laceration   I personally performed the services described in this documentation, which was scribed in my presence. The recorded information has been reviewed and is accurate.     [name], PA-C 11/12/14 1619  Alvira Monday, MD 11/12/14 [phone]

## 2014-11-12 NOTE — ED Notes (Signed)
Pt reports cutting left arm on piece of metal today, has approx 1 inch laceration to anterior forearm. No bleeding noted. Able to move all digits.

## 2014-11-14 ENCOUNTER — Telehealth: Payer: Self-pay | Admitting: Family Medicine

## 2014-11-14 DIAGNOSIS — M069 Rheumatoid arthritis, unspecified: Secondary | ICD-10-CM

## 2014-11-14 MED ORDER — OXYCODONE-ACETAMINOPHEN 5-325 MG PO TABS: 1.0000 | ORAL_TABLET | Freq: Four times a day (QID) | ORAL | Status: DC | PRN

## 2014-11-14 NOTE — Telephone Encounter (Signed)
Patient requesting rx for pain meds.  He will be here next Tues and would like to pick up then.  thanks

## 2014-11-14 NOTE — Telephone Encounter (Signed)
[name], Rx in your in box ready for pickup.

## 2014-11-14 NOTE — Telephone Encounter (Signed)
Patient notified

## 2014-11-20 ENCOUNTER — Ambulatory Visit: Payer: Self-pay | Admitting: Family Medicine

## 2014-12-06 ENCOUNTER — Encounter: Payer: Self-pay | Admitting: Family Medicine

## 2014-12-06 ENCOUNTER — Ambulatory Visit (INDEPENDENT_AMBULATORY_CARE_PROVIDER_SITE_OTHER): Payer: Self-pay | Admitting: Family Medicine

## 2014-12-06 ENCOUNTER — Ambulatory Visit (INDEPENDENT_AMBULATORY_CARE_PROVIDER_SITE_OTHER): Payer: Self-pay | Admitting: Psychiatry

## 2014-12-06 ENCOUNTER — Encounter (HOSPITAL_COMMUNITY): Payer: Self-pay | Admitting: Psychiatry

## 2014-12-06 VITALS — BP 150/98 | HR 73 | Wt 235.0 lb

## 2014-12-06 VITALS — Ht 69.0 in | Wt 235.0 lb

## 2014-12-06 DIAGNOSIS — M069 Rheumatoid arthritis, unspecified: Secondary | ICD-10-CM

## 2014-12-06 DIAGNOSIS — F331 Major depressive disorder, recurrent, moderate: Secondary | ICD-10-CM

## 2014-12-06 DIAGNOSIS — F411 Generalized anxiety disorder: Secondary | ICD-10-CM

## 2014-12-06 DIAGNOSIS — G4700 Insomnia, unspecified: Secondary | ICD-10-CM

## 2014-12-06 DIAGNOSIS — I10 Essential (primary) hypertension: Secondary | ICD-10-CM

## 2014-12-06 MED ORDER — FLUOXETINE HCL 40 MG PO CAPS: 40.0000 mg | ORAL_CAPSULE | Freq: Two times a day (BID) | ORAL | Status: DC

## 2014-12-06 MED ORDER — TRAZODONE HCL 50 MG PO TABS: 50.0000 mg | ORAL_TABLET | Freq: Every evening | ORAL | Status: DC | PRN

## 2014-12-06 MED ORDER — OXYCODONE-ACETAMINOPHEN 5-325 MG PO TABS: 1.0000 | ORAL_TABLET | Freq: Four times a day (QID) | ORAL | Status: DC | PRN

## 2014-12-06 NOTE — Progress Notes (Signed)
Patient ID: [name], male   DOB: June 29, 1970, 44 y.o.   MRN: [id number] [facility] Follow-up Outpatient Visit  [name] [id number] 44 y.o.  12/06/2014  Chief Complaint: follow up for depression and anxiety.     History of Present Illness:     HPI Comments: [name] is a 44 y/o male with a past psychiatric history significant for symptoms of anxiety and depression. The patient is referred for psychiatric services for medication management.    . Location: The patient has lost his mom in December 2015. He is now worried some about coming anniversary. The patient is still going through the appeals process for disability says cannot work due to arthritis. Also arthritis keeps him down as it effects his mood.  Also patient's fianc has got a stroke she has been recovering slowly so she is needing more of his help and time. Depression and anxiety fluctuates with pain and day to day stress. Says he at times doe not like himself as he is not working.  He reports he is taking his medications and denies any side effects.   In the area of affective symptoms, patient appears a. Patient denies current suicidal ideation, intent, or plan. Patient denies current homicidal ideation, intent, or plan. Patient denies auditory hallucinations. Patient denies visual hallucinations. Patient denies symptoms of paranoia. Patient states sleep is poor, with approximately 2 -9 hours of sleep per night. Appetite is increased. Energy level is fair to low. Patient endorses some symptoms of anhedonia. Patient endorses hopelessness and helplessness, denies guilt.  Sleep remains poor unless he takes trazadone.  Modifying factors: his fiance is supportive but has recent a stroke . Severity: Depression: 6/10 (0=Very depressed; 5=Neutral; 10=Very Happy)  Anxiety- 6/10 (0=no anxiety; 5= moderate/tolerable anxiety; 10= panic attacks)  . Duration: has had symptoms in his 20's but with medical  problems, mood has progressively worsened.   . Timing: Mood still worse midday.  . Context: Finances, Disability process,. Aggravated by current medical issues. Ruminating about "all the things that are wrong."  . Modifying factors: Improves with spending time with his children.  . Associated signs and symptoms:    Past Medical History  Diagnosis Date  . Pericarditis   . History of lithotripsy   . Rheumatoid arthritis(714.0)   . Gout   . Hypertension   . Kidney stones   . Renal tubular acidosis   . Anxiety   . Nephrolithiasis 12/11/2011  . Lumbar degenerative disc disease 12/11/2011  . Essential hypertension 12/11/2011  . Arthritis   . Depression    Family History  Problem Relation Age of Onset  . Hypertension Father   . Rheum arthritis Father   . Alcohol abuse Father   . Heart attack Father   . Diabetes Mellitus II Father   . Coronary artery disease Father   . Diabetes Mellitus II Mother   . Cancer - Colon Mother   . Coronary artery disease Mother   . Heart attack Mother   . Ovarian cancer Mother   . Uterine cancer Mother   . Dementia Neg Hx   . Bipolar disorder Neg Hx   . Depression Neg Hx   . Drug abuse Neg Hx   . OCD Neg Hx     Outpatient Encounter Prescriptions as of 12/06/2014  Medication Sig  . AMBULATORY NON FORMULARY MEDICATION Medication Name: Gilmer Mor, use as needed.  Dx: Rheumatoid Arthritis  . amLODipine (NORVASC) 10 MG tablet One tablet by mouth every day  for blood pressure control.  . cyclobenzaprine (FLEXERIL) 10 MG tablet Take 1 tablet (10 mg total) by mouth 3 (three) times daily as needed for muscle spasms.  Marland Kitchen eletriptan (RELPAX) 40 MG tablet Take 1 tablet (40 mg total) by mouth as needed for migraine or headache. May repeat in 2 hours if headache persists or recurs.  Marland Kitchen esomeprazole (NEXIUM) 40 MG capsule Take 1 capsule (40 mg total) by mouth daily.  . Etanercept (ENBREL [place]) Inject into the skin once a week.  Marland Kitchen FLUoxetine (PROZAC) 40 MG  capsule Take 1 capsule (40 mg total) by mouth 2 (two) times daily.  . folic acid (FOLVITE) 1 MG tablet 1 mg.  . hydrochlorothiazide (HYDRODIURIL) 25 MG tablet One and one half tablet by mouth every morning for blood pressure control.  . hydrOXYzine (ATARAX/VISTARIL) 25 MG tablet Take one to two prn for anxiety  . oxyCODONE-acetaminophen (PERCOCET) 5-325 MG tablet Take 1-2 tablets by mouth every 6 (six) hours as needed for moderate pain.  . pregabalin (LYRICA) 50 MG capsule Take 1 capsule (50 mg total) by mouth daily.  . promethazine (PHENERGAN) 25 MG tablet Take 1 tablet (25 mg total) by mouth every 6 (six) hours as needed for nausea.  . ranitidine (ZANTAC) 150 MG tablet One by mouth twice a day to prevent gastric reflux symptoms.  . traZODone (DESYREL) 50 MG tablet Take 1 tablet (50 mg total) by mouth at bedtime as needed for sleep. Take one-half to two tablets at bedtime.  . [DISCONTINUED] FLUoxetine (PROZAC) 40 MG capsule Take 1 capsule (40 mg total) by mouth 2 (two) times daily.  . [DISCONTINUED] oxyCODONE-acetaminophen (PERCOCET) 5-325 MG tablet Take 1-2 tablets by mouth every 6 (six) hours as needed for moderate pain.  . [DISCONTINUED] traZODone (DESYREL) 50 MG tablet Take 1 tablet (50 mg total) by mouth at bedtime as needed for sleep. Take one-half to two tablets at bedtime.   No facility-administered encounter medications on file as of 12/06/2014.    No results found for this or any previous visit (from the past 2160 hour(s)).  Ht 5\' 9"  (1.753 m)  Wt 235 lb (106.595 kg)  BMI 34.69 kg/m2   Review of Systems  Constitutional: Negative.   Musculoskeletal: Positive for joint pain.  Skin: Negative for rash.  Neurological: Negative for headaches.  Psychiatric/Behavioral: Negative for suicidal ideas. The patient has insomnia.     Mental Status Examination  Appearance: casual and cooperative Alert: Yes Attention: fair  Cooperative: Yes Eye Contact: Good Speech:  coherent Psychomotor Activity: Normal Memory/Concentration: adequate Oriented: person, place, time/date and situation Mood: Dysphoric at times. Not hopeless Affect: Constricted Thought Processes and Associations: Coherent Fund of Knowledge: Fair Thought Content: Suicidal ideation and Homicidal ideation were denied. Denies hallucinations Insight: Fair Judgement: Fair  Diagnosis: Major depressive disorder recurrent moderate to severe. Generalized anxiety disorder. Insomnia. 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 He needs a 90 day supply for depression and anxiety.  Continue Vistaril 1 tablet to 2 tablets when necessary for anxiety and stress. Has refill  Continue trazodone 50 mg when necessary for sleep or insomnia. Prescription given.  In regarding to arthritis he is, be taking IM injection every week and that'll help his pain. His pain does affect his depression Medical complexity: Mood also effected by pain. He is currently taking pain meds.  Fiance has stroke, has added stress. Pertinent Labs and Relevant Prior Notes reviewed. Medication Side effects, benefits and  risks reviewed/discussed with Patient. Time given for patient to respond and asks questions regarding the Diagnosis and Medications. Safety concerns and to report to ER if suicidal or call 911. Relevant Medications refilled or called in to pharmacy. Discussed weight maintenance and Sleep Hygiene. Follow up with Primary care provider in regards to Medical conditions. Recommend compliance with medications and follow up office appointments. Discussed to avail opportunity to consider or/and continue Individual therapy with Counselor. Greater than 50% of time was spend in counseling and coordination of care with the patient.  Schedule for Follow up visit in 2 months.  or call in earlier as necessary.  Time spent: 25 minutes  Thresa Ross, MD 12/06/2014

## 2014-12-06 NOTE — Progress Notes (Signed)
CC: [name] is a 44 y.o. male is here for Wound Check   Subjective: HPI:  Follow-up rheumatoid arthritis: He is having difficulty getting to [facility] for his initial rheumatology visit. His having to take care of his wife who recently had a stroke and is dependent on him. He would like another somewhere in [place] for further spell that he can go to. He continues to have moderate to severe pain in the shoulders and in the hands worse in cold weather. It improves for a few hours after taking Percocet. He is waiting to hear on the ruling for his disability, trial occurred last week. He denies any swelling redness or warmth of any joints.  Follow-up essential hypertension: Approximate 75% compliance on amlodipine and hydrochlorothiazide. No outside blood pressures report. He denies any known side effects. Denies chest pain shortness of breath orthopnea nor peripheral edema   Review Of Systems Outlined In HPI  Past Medical History  Diagnosis Date  . Pericarditis   . History of lithotripsy   . Rheumatoid arthritis(714.0)   . Gout   . Hypertension   . Kidney stones   . Renal tubular acidosis   . Anxiety   . Nephrolithiasis 12/11/2011  . Lumbar degenerative disc disease 12/11/2011  . Essential hypertension 12/11/2011  . Arthritis   . Depression     Past Surgical History  Procedure Laterality Date  . Hemorroidectomy    . Kidney stone surgery    . Anterior cervical decomp/discectomy fusion     Family History  Problem Relation Age of Onset  . Hypertension Father   . Rheum arthritis Father   . Alcohol abuse Father   . Heart attack Father   . Diabetes Mellitus II Father   . Coronary artery disease Father   . Diabetes Mellitus II Mother   . Cancer - Colon Mother   . Coronary artery disease Mother   . Heart attack Mother   . Ovarian cancer Mother   . Uterine cancer Mother   . Dementia Neg Hx   . Bipolar disorder Neg Hx   . Depression Neg Hx   . Drug abuse Neg Hx    . OCD Neg Hx     Social History   Social History  . Marital Status: Legally Separated    Spouse Name: N/A  . Number of Children: N/A  . Years of Education: N/A   Occupational History  . Not on file.   Social History Main Topics  . Smoking status: Current Every Day Smoker -- 0.50 packs/day for 29 years  . Smokeless tobacco: Not on file     Comment: Nicotrol  . Alcohol Use: No  . Drug Use: No  . Sexual Activity:    Partners: Female   Other Topics Concern  . Not on file   Social History Narrative     Objective: BP 150/98 mmHg  Pulse 73  Wt 235 lb (106.595 kg)  General: Alert and Oriented, No Acute Distress HEENT: Pupils equal, round, reactive to light. Conjunctivae clear.  Moist mucous membranes Lungs: Clear to auscultation bilaterally, no wheezing/ronchi/rales.  Comfortable work of breathing. Good air movement. Cardiac: Regular rate and rhythm. Normal S1/S2.  No murmurs, rubs, nor gallops.   Extremities: No peripheral edema.  Strong peripheral pulses.  Mental Status: No depression, anxiety, nor agitation. Skin: Warm and dry.  Assessment & Plan: [name] was seen today for wound check.  Diagnoses and all orders for this visit:  Rheumatoid arthritis of hand, unspecified  laterality, unspecified rheumatoid factor presence (HCC) -     Ambulatory referral to Rheumatology -     oxyCODONE-acetaminophen (PERCOCET) 5-325 MG tablet; Take 1-2 tablets by mouth every 6 (six) hours as needed for moderate pain.  Essential hypertension   Rheumatoid arthritis: Stable, continue pain management with Percocet, he is open to the idea of pain management referral once he gets insurance.  Essential hypertension: Uncontrolled chronic condition, encouraged 100% compliance with amlodipine and hydrochlorothiazide.     Return in about 3 months (around 03/08/2015) for Blood pressure.

## 2014-12-10 ENCOUNTER — Telehealth: Payer: Self-pay | Admitting: Family Medicine

## 2014-12-10 NOTE — Telephone Encounter (Signed)
Dr. Ivan Anchors   I received a fax from [facility] stating that Dr. Corliss Skains has declined the referral is there another rheumatologist in [facility] you would like me to submit the referral too?  CF

## 2014-12-10 NOTE — Telephone Encounter (Signed)
Yes, will you please try Dr. Stacey Drain [phone]

## 2015-01-30 ENCOUNTER — Telehealth: Payer: Self-pay

## 2015-01-30 DIAGNOSIS — M069 Rheumatoid arthritis, unspecified: Secondary | ICD-10-CM

## 2015-01-30 MED ORDER — OXYCODONE-ACETAMINOPHEN 5-325 MG PO TABS: 1.0000 | ORAL_TABLET | Freq: Four times a day (QID) | ORAL | Status: DC | PRN

## 2015-01-30 NOTE — Telephone Encounter (Signed)
For this patient, yes. [name], Rx placed in in-box ready for pickup/faxing.

## 2015-01-30 NOTE — Telephone Encounter (Signed)
[name] is requesting a refill on his oxycodone.  Is this request appropriate?

## 2015-01-31 NOTE — Telephone Encounter (Signed)
Pt.notified

## 2015-03-04 ENCOUNTER — Ambulatory Visit (HOSPITAL_COMMUNITY): Payer: Self-pay | Admitting: Psychiatry

## 2015-03-07 ENCOUNTER — Other Ambulatory Visit: Payer: Self-pay

## 2015-03-07 DIAGNOSIS — M069 Rheumatoid arthritis, unspecified: Secondary | ICD-10-CM

## 2015-03-07 MED ORDER — OXYCODONE-ACETAMINOPHEN 5-325 MG PO TABS: 1.0000 | ORAL_TABLET | Freq: Four times a day (QID) | ORAL | Status: DC | PRN

## 2015-03-08 ENCOUNTER — Ambulatory Visit (INDEPENDENT_AMBULATORY_CARE_PROVIDER_SITE_OTHER): Payer: Self-pay | Admitting: Psychiatry

## 2015-03-08 ENCOUNTER — Encounter (HOSPITAL_COMMUNITY): Payer: Self-pay | Admitting: Psychiatry

## 2015-03-08 VITALS — BP 132/80 | HR 63 | Ht 69.0 in | Wt 233.0 lb

## 2015-03-08 DIAGNOSIS — G4700 Insomnia, unspecified: Secondary | ICD-10-CM

## 2015-03-08 DIAGNOSIS — F4323 Adjustment disorder with mixed anxiety and depressed mood: Secondary | ICD-10-CM

## 2015-03-08 DIAGNOSIS — F331 Major depressive disorder, recurrent, moderate: Secondary | ICD-10-CM

## 2015-03-08 DIAGNOSIS — F411 Generalized anxiety disorder: Secondary | ICD-10-CM

## 2015-03-08 MED ORDER — FLUOXETINE HCL 40 MG PO CAPS: 40.0000 mg | ORAL_CAPSULE | Freq: Two times a day (BID) | ORAL | Status: DC

## 2015-03-08 NOTE — Progress Notes (Signed)
Patient ID: [name], male   DOB: 30-Jun-1970, 45 y.o.   MRN: [id number] [facility] Follow-up Outpatient Visit  [name] [id number] 45 y.o.  03/08/2015  Chief Complaint: follow up for depression and anxiety.     History of Present Illness:     HPI Comments: [name] is a 46 y/o male with a past psychiatric history significant for symptoms of anxiety and depression. The patient is referred for psychiatric services for medication management.    . Location: The patient has lost his mom in December 2015.Marland Kitchen Anniversaries worry him.  Recently his fiance has had a stroke and is going through limitations and memory and pain that is affecting and adding stress at home.  He is on pain medications for arthritis. Pain keeps him limited.  He did been the case for getting on disability that was his main stressor last time.  In the area of affective symptoms, patient appears somewhat stressed.  Patient denies current suicidal ideation, intent, or plan. Patient denies current homicidal ideation, intent, or plan. Patient denies auditory hallucinations. Patient denies visual hallucinations. Patient denies symptoms of paranoia. Patient states sleep is poor, with approximately 2 -9 hours of sleep per night. Appetite is increased. Energy level is fair to low. Patient endorses some symptoms of anhedonia. Patient endorses hopelessness and helplessness, denies guilt.  Sleep remains poor unless he takes trazadone.  Modifying factors: his fiance is supportive but has recent a stroke. His recent disablity approved . Severity: Depression: 6/10 (0=Very depressed; 5=Neutral; 10=Very Happy)  Anxiety- 6/10 (0=no anxiety; 5= moderate/tolerable anxiety; 10= panic attacks)  . Duration: has had symptoms in his 20's but with medical problems, mood has progressively worsened.   . Timing: Mood still worse midday.  . Context: Finances, Disability process,. Aggravated by current medical  issues. Ruminating about "all the things that are wrong."  . Modifying factors: Improves with spending time with his children.  . Associated signs and symptoms:    Past Medical History  Diagnosis Date  . Pericarditis   . History of lithotripsy   . Rheumatoid arthritis(714.0)   . Gout   . Hypertension   . Kidney stones   . Renal tubular acidosis   . Anxiety   . Nephrolithiasis 12/11/2011  . Lumbar degenerative disc disease 12/11/2011  . Essential hypertension 12/11/2011  . Arthritis   . Depression    Family History  Problem Relation Age of Onset  . Hypertension Father   . Rheum arthritis Father   . Alcohol abuse Father   . Heart attack Father   . Diabetes Mellitus II Father   . Coronary artery disease Father   . Diabetes Mellitus II Mother   . Cancer - Colon Mother   . Coronary artery disease Mother   . Heart attack Mother   . Ovarian cancer Mother   . Uterine cancer Mother   . Dementia Neg Hx   . Bipolar disorder Neg Hx   . Depression Neg Hx   . Drug abuse Neg Hx   . OCD Neg Hx     Outpatient Encounter Prescriptions as of 03/08/2015  Medication Sig  . AMBULATORY NON FORMULARY MEDICATION Medication Name: Gilmer Mor, use as needed.  Dx: Rheumatoid Arthritis  . amLODipine (NORVASC) 10 MG tablet One tablet by mouth every day for blood pressure control.  . cyclobenzaprine (FLEXERIL) 10 MG tablet Take 1 tablet (10 mg total) by mouth 3 (three) times daily as needed for muscle spasms.  Marland Kitchen eletriptan (RELPAX) 40  MG tablet Take 1 tablet (40 mg total) by mouth as needed for migraine or headache. May repeat in 2 hours if headache persists or recurs.  Marland Kitchen esomeprazole (NEXIUM) 40 MG capsule Take 1 capsule (40 mg total) by mouth daily.  . Etanercept (ENBREL [place]) Inject into the skin once a week.  Marland Kitchen FLUoxetine (PROZAC) 40 MG capsule Take 1 capsule (40 mg total) by mouth 2 (two) times daily.  . folic acid (FOLVITE) 1 MG tablet 1 mg.  . hydrochlorothiazide (HYDRODIURIL) 25 MG tablet One  and one half tablet by mouth every morning for blood pressure control.  Marland Kitchen oxyCODONE-acetaminophen (PERCOCET) 5-325 MG tablet Take 1-2 tablets by mouth every 6 (six) hours as needed for moderate pain.  . pregabalin (LYRICA) 50 MG capsule Take 1 capsule (50 mg total) by mouth daily.  . promethazine (PHENERGAN) 25 MG tablet Take 1 tablet (25 mg total) by mouth every 6 (six) hours as needed for nausea.  . ranitidine (ZANTAC) 150 MG tablet One by mouth twice a day to prevent gastric reflux symptoms.  . traZODone (DESYREL) 50 MG tablet Take 1 tablet (50 mg total) by mouth at bedtime as needed for sleep. Take one-half to two tablets at bedtime.  . [DISCONTINUED] FLUoxetine (PROZAC) 40 MG capsule Take 1 capsule (40 mg total) by mouth 2 (two) times daily.  . [DISCONTINUED] hydrOXYzine (ATARAX/VISTARIL) 25 MG tablet Take one to two prn for anxiety   No facility-administered encounter medications on file as of 03/08/2015.    No results found for this or any previous visit (from the past 2160 hour(s)).  BP 132/80 mmHg  Pulse 63  Ht 5\' 9"  (1.753 m)  Wt 233 lb (105.688 kg)  BMI 34.39 kg/m2  SpO2 94%   Review of Systems  Constitutional: Negative.   Musculoskeletal: Positive for joint pain.  Skin: Negative for rash.  Neurological: Negative for headaches.  Psychiatric/Behavioral: Negative for suicidal ideas. The patient is nervous/anxious.     Mental Status Examination  Appearance: casual and cooperative Alert: Yes Attention: fair  Cooperative: Yes Eye Contact: Good Speech: coherent Psychomotor Activity: Normal Memory/Concentration: adequate Oriented: person, place, time/date and situation Mood: Dysphoric at times. Not hopeless Affect: Constricted Thought Processes and Associations: Coherent Fund of Knowledge: Fair Thought Content: Suicidal ideation and Homicidal ideation were denied. Denies hallucinations Insight: Fair Judgement: Fair  Diagnosis: Major depressive disorder recurrent  moderate to severe. Generalized anxiety disorder. Insomnia. 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 He needs a 90 day supply for depression and anxiety.  Continue Vistaril 1 tablet to 2 tablets when necessary for anxiety and stress. Has refill  Continue trazodone 50 mg when necessary for sleep or insomnia. Prescription given.   In regarding to arthritis he is, be taking IM injection every week and that'll help his pain. His pain does affect his depression Medical complexity: Mood also effected by pain. He is currently taking pain meds.  Fiance has stroke, has added stress. Her daughter helps them out . Provided supportive therapy  Pertinent Labs and Relevant Prior Notes reviewed. Medication Side effects, benefits and risks reviewed/discussed with Patient. Time given for patient to respond and asks questions regarding the Diagnosis and Medications. Safety concerns and to report to ER if suicidal or call 911.  Follow up with Primary care provider in regards to Medical conditions. Recommend compliance with medications and follow up office appointments. Discussed to avail opportunity to consider or/and continue Individual therapy with  Counselor. Greater than 50% of time was spend in counseling and coordination of care with the patient.  Schedule for Follow up visit in 2 months.  or call in earlier as necessary.  Time spent: 25 minutes  Thresa Ross, MD 03/08/2015

## 2015-04-04 DIAGNOSIS — H40033 Anatomical narrow angle, bilateral: Secondary | ICD-10-CM | POA: Diagnosis not present

## 2015-04-04 DIAGNOSIS — G44219 Episodic tension-type headache, not intractable: Secondary | ICD-10-CM | POA: Diagnosis not present

## 2015-04-09 ENCOUNTER — Other Ambulatory Visit: Payer: Self-pay | Admitting: *Deleted

## 2015-04-09 DIAGNOSIS — M069 Rheumatoid arthritis, unspecified: Secondary | ICD-10-CM

## 2015-04-09 MED ORDER — OXYCODONE-ACETAMINOPHEN 5-325 MG PO TABS: 1.0000 | ORAL_TABLET | Freq: Four times a day (QID) | ORAL | Status: DC | PRN

## 2015-05-17 ENCOUNTER — Other Ambulatory Visit: Payer: Self-pay

## 2015-05-17 DIAGNOSIS — M069 Rheumatoid arthritis, unspecified: Secondary | ICD-10-CM

## 2015-05-17 MED ORDER — OXYCODONE-ACETAMINOPHEN 5-325 MG PO TABS: 1.0000 | ORAL_TABLET | Freq: Four times a day (QID) | ORAL | Status: DC | PRN

## 2015-05-20 ENCOUNTER — Encounter: Payer: Self-pay | Admitting: Family Medicine

## 2015-05-20 ENCOUNTER — Ambulatory Visit (INDEPENDENT_AMBULATORY_CARE_PROVIDER_SITE_OTHER): Payer: Medicare Other | Admitting: Family Medicine

## 2015-05-20 VITALS — BP 149/96 | HR 68 | Wt 239.0 lb

## 2015-05-20 DIAGNOSIS — I10 Essential (primary) hypertension: Secondary | ICD-10-CM

## 2015-05-20 DIAGNOSIS — M069 Rheumatoid arthritis, unspecified: Secondary | ICD-10-CM

## 2015-05-20 DIAGNOSIS — L723 Sebaceous cyst: Secondary | ICD-10-CM | POA: Diagnosis not present

## 2015-05-20 MED ORDER — OXYCODONE-ACETAMINOPHEN 10-325 MG PO TABS: 1.0000 | ORAL_TABLET | Freq: Four times a day (QID) | ORAL | Status: DC | PRN

## 2015-05-20 MED ORDER — TRIAMCINOLONE ACETONIDE 0.1 % EX CREA: TOPICAL_CREAM | CUTANEOUS | Status: DC

## 2015-05-20 MED ORDER — HYDROCHLOROTHIAZIDE 25 MG PO TABS: ORAL_TABLET | ORAL | Status: DC

## 2015-05-20 MED ORDER — AMLODIPINE BESYLATE 10 MG PO TABS: ORAL_TABLET | ORAL | Status: DC

## 2015-05-20 MED ORDER — PANTOPRAZOLE SODIUM 40 MG PO TBEC: 40.0000 mg | DELAYED_RELEASE_TABLET | Freq: Every day | ORAL | Status: DC

## 2015-05-20 MED ORDER — ELETRIPTAN HYDROBROMIDE 40 MG PO TABS: 40.0000 mg | ORAL_TABLET | ORAL | Status: DC | PRN

## 2015-05-20 NOTE — Progress Notes (Signed)
CC: [name] is a 45 y.o. male is here for Medication Refill and skin lesion   Subjective: HPI:  Follow-up rheumatoid arthritis: He would like to know if I can send him to a rheumatologist in the [place] area. The physician that he was sent to most recently would only see him in [place], and the distance complicates his ability to attend a potential appointment. He continues takes balancing pain in the wrists, ankles and fingers on a daily basis and wonders if there is a higher dose of oxycodone that he can take. It seems to help for a few hours but never really takes care of the pain on initial dosing. Denies any swelling redness or warmth of any of the joints.  Follow-up essential hypertension: His run out of hydrochlorothiazide continues to take amlodipine daily. Requesting refills. Denies chest pain shortness of breath orthopnea nor peripheral edema.  He has mild swelling on the upper left aspect of the buttock cleavage that's been present off and on for the past few months. It seems to fluctuate throughout the weeks and will enlarge to a degree will become tender and then will drain which results his pain 100% however this is only temporary until the swelling returns. He denies fevers, chills or swollen lymph nodes. No interventions as of yet   Review Of Systems Outlined In HPI  Past Medical History  Diagnosis Date  . Pericarditis   . History of lithotripsy   . Rheumatoid arthritis(714.0)   . Gout   . Hypertension   . Kidney stones   . Renal tubular acidosis   . Anxiety   . Nephrolithiasis 12/11/2011  . Lumbar degenerative disc disease 12/11/2011  . Essential hypertension 12/11/2011  . Arthritis   . Depression     Past Surgical History  Procedure Laterality Date  . Hemorroidectomy    . Kidney stone surgery    . Anterior cervical decomp/discectomy fusion     Family History  Problem Relation Age of Onset  . Hypertension Father   . Rheum arthritis Father   .  Alcohol abuse Father   . Heart attack Father   . Diabetes Mellitus II Father   . Coronary artery disease Father   . Diabetes Mellitus II Mother   . Cancer - Colon Mother   . Coronary artery disease Mother   . Heart attack Mother   . Ovarian cancer Mother   . Uterine cancer Mother   . Dementia Neg Hx   . Bipolar disorder Neg Hx   . Depression Neg Hx   . Drug abuse Neg Hx   . OCD Neg Hx     Social History   Social History  . Marital Status: Legally Separated    Spouse Name: N/A  . Number of Children: N/A  . Years of Education: N/A   Occupational History  . Not on file.   Social History Main Topics  . Smoking status: Current Every Day Smoker -- 0.50 packs/day for 29 years  . Smokeless tobacco: Not on file     Comment: Nicotrol  . Alcohol Use: No  . Drug Use: No  . Sexual Activity:    Partners: Female   Other Topics Concern  . Not on file   Social History Narrative     Objective: BP 149/96 mmHg  Pulse 68  Wt 239 lb (108.41 kg)  General: Alert and Oriented, No Acute Distress HEENT: Pupils equal, round, reactive to light. Conjunctivae clear. Moist nevus membranes Lungs: Clear to  auscultation bilaterally, no wheezing/ronchi/rales.  Comfortable work of breathing. Good air movement. Cardiac: Regular rate and rhythm. Normal S1/S2.  No murmurs, rubs, nor gallops.   Extremities: No peripheral edema.  Strong peripheral pulses.  Mental Status: No depression, anxiety, nor agitation. Skin: Warm and dry. Inflamed sebaceous cyst approximately 5 mm in diameter on the left upper medial aspect of the buttock cleavage.  Assessment & Plan: Seraj was seen today for medication refill and skin lesion.  Diagnoses and all orders for this visit:  Rheumatoid arthritis of hand, unspecified laterality, unspecified rheumatoid factor presence (HCC) -     Ambulatory referral to Rheumatology  Essential hypertension -     amLODipine (NORVASC) 10 MG tablet; One tablet by mouth every day for  blood pressure control.  Sebaceous cyst  Other orders -     oxyCODONE-acetaminophen (PERCOCET) 10-325 MG tablet; Take 1-1.5 tablets by mouth every 6 (six) hours as needed for pain. -     pantoprazole (PROTONIX) 40 MG tablet; Take 1 tablet (40 mg total) by mouth daily. -     eletriptan (RELPAX) 40 MG tablet; Take 1 tablet (40 mg total) by mouth as needed for migraine or headache. May repeat in 2 hours if headache persists or recurs. -     hydrochlorothiazide (HYDRODIURIL) 25 MG tablet; One and one half tablet by mouth every morning for blood pressure control. -     triamcinolone cream (KENALOG) 0.1 %; Apply to affected areas twice a day for up to two weeks, avoid face.  Rheumatoid arthritis: Uncontrolled chronic condition with respect to pain control therefore increasing Percocet Essential hypertension: Uncontrolled off of hydrochlorothiazide and restart 25 mg daily and continue amlodipine 10 mg daily. Sebaceous cyst: Start using triamcinolone cream to help with inflammation and if no better after 2 weeks follow-up for a visit dedicated to biopsy  Return in about 3 months (around 08/19/2015) for BP.

## 2015-06-04 ENCOUNTER — Encounter (HOSPITAL_COMMUNITY): Payer: Self-pay | Admitting: Psychiatry

## 2015-06-04 ENCOUNTER — Ambulatory Visit (INDEPENDENT_AMBULATORY_CARE_PROVIDER_SITE_OTHER): Payer: Medicare Other | Admitting: Psychiatry

## 2015-06-04 ENCOUNTER — Telehealth: Payer: Self-pay | Admitting: Family Medicine

## 2015-06-04 VITALS — BP 128/74 | HR 82 | Ht 69.0 in | Wt 238.0 lb

## 2015-06-04 DIAGNOSIS — F331 Major depressive disorder, recurrent, moderate: Secondary | ICD-10-CM | POA: Diagnosis not present

## 2015-06-04 DIAGNOSIS — F4323 Adjustment disorder with mixed anxiety and depressed mood: Secondary | ICD-10-CM | POA: Diagnosis not present

## 2015-06-04 DIAGNOSIS — M5136 Other intervertebral disc degeneration, lumbar region: Secondary | ICD-10-CM

## 2015-06-04 DIAGNOSIS — M069 Rheumatoid arthritis, unspecified: Secondary | ICD-10-CM

## 2015-06-04 DIAGNOSIS — F411 Generalized anxiety disorder: Secondary | ICD-10-CM | POA: Diagnosis not present

## 2015-06-04 DIAGNOSIS — G4700 Insomnia, unspecified: Secondary | ICD-10-CM

## 2015-06-04 MED ORDER — TRAZODONE HCL 50 MG PO TABS: 50.0000 mg | ORAL_TABLET | Freq: Every evening | ORAL | Status: DC | PRN

## 2015-06-04 MED ORDER — FLUOXETINE HCL 40 MG PO CAPS: 40.0000 mg | ORAL_CAPSULE | Freq: Two times a day (BID) | ORAL | Status: DC

## 2015-06-04 NOTE — Progress Notes (Signed)
Patient ID: EILAM SHREWSBURY, male   DOB: 05/31/1970, 46 y.o.   MRN: [id number] [facility] Health Follow-up Outpatient Visit  DYRON KAWANO [id number] 45 y.o.  06/04/2015  Chief Complaint: follow up for depression and anxiety.     History of Present Illness:     HPI Comments: Mr. Smestad is a 45 y/o male with a past psychiatric history significant for symptoms of anxiety and depression. The patient is referred for psychiatric services for medication management.    . Location: The patient  lost his mom in December 2015.Marland Kitchen Anniversaries worry him.   fiance has had a stroke but better. She suffers from pain. Tolerating meds. No headaches or nausea.  He is on pain medications for arthritis. Pain keeps him limited.  He did been the case for getting on disability that was his main stressor last time. Planning for wrist surgery.  Sleep remains poor unless he takes trazadone.  Modifying factors: his fiance is supportive but has recent a stroke. His disablity was approved 2016. Marland Kitchen Severity: Depression: 6/10 (0=Very depressed; 5=Neutral; 10=Very Happy)  Anxiety- 6/10 (0=no anxiety; 5= moderate/tolerable anxiety; 10= panic attacks)  . Duration: has had symptoms in his 20's . Fluctuated mood  At times.  . Timing: Mood still worse midday.  . Context: Finances, Disability process,. Aggravated by current medical issues. Ruminating about "all the things that are wrong."  . Modifying factors: Improves with spending time with his children.  . Associated signs and symptoms:    Past Medical History  Diagnosis Date  . Pericarditis   . History of lithotripsy   . Rheumatoid arthritis(714.0)   . Gout   . Hypertension   . Kidney stones   . Renal tubular acidosis   . Anxiety   . Nephrolithiasis 12/11/2011  . Lumbar degenerative disc disease 12/11/2011  . Essential hypertension 12/11/2011  . Arthritis   . Depression    Family History  Problem Relation Age of Onset  .  Hypertension Father   . Rheum arthritis Father   . Alcohol abuse Father   . Heart attack Father   . Diabetes Mellitus II Father   . Coronary artery disease Father   . Diabetes Mellitus II Mother   . Cancer - Colon Mother   . Coronary artery disease Mother   . Heart attack Mother   . Ovarian cancer Mother   . Uterine cancer Mother   . Dementia Neg Hx   . Bipolar disorder Neg Hx   . Depression Neg Hx   . Drug abuse Neg Hx   . OCD Neg Hx     Outpatient Encounter Prescriptions as of 06/04/2015  Medication Sig  . AMBULATORY NON FORMULARY MEDICATION Medication Name: Gilmer Mor, use as needed.  Dx: Rheumatoid Arthritis  . amLODipine (NORVASC) 10 MG tablet One tablet by mouth every day for blood pressure control.  . cyclobenzaprine (FLEXERIL) 10 MG tablet Take 1 tablet (10 mg total) by mouth 3 (three) times daily as needed for muscle spasms.  Marland Kitchen eletriptan (RELPAX) 40 MG tablet Take 1 tablet (40 mg total) by mouth as needed for migraine or headache. May repeat in 2 hours if headache persists or recurs.  . Etanercept (ENBREL [place]) Inject into the skin once a week.  Marland Kitchen FLUoxetine (PROZAC) 40 MG capsule Take 1 capsule (40 mg total) by mouth 2 (two) times daily.  . folic acid (FOLVITE) 1 MG tablet 1 mg.  . hydrochlorothiazide (HYDRODIURIL) 25 MG tablet One and one half tablet by mouth  every morning for blood pressure control.  Marland Kitchen oxyCODONE-acetaminophen (PERCOCET) 10-325 MG tablet Take 1-1.5 tablets by mouth every 6 (six) hours as needed for pain.  . pantoprazole (PROTONIX) 40 MG tablet Take 1 tablet (40 mg total) by mouth daily.  . pregabalin (LYRICA) 50 MG capsule Take 1 capsule (50 mg total) by mouth daily.  . promethazine (PHENERGAN) 25 MG tablet Take 1 tablet (25 mg total) by mouth every 6 (six) hours as needed for nausea.  . ranitidine (ZANTAC) 150 MG tablet One by mouth twice a day to prevent gastric reflux symptoms.  . traZODone (DESYREL) 50 MG tablet Take 1 tablet (50 mg total) by mouth at bedtime  as needed for sleep. Take one-half to two tablets at bedtime.  . triamcinolone cream (KENALOG) 0.1 % Apply to affected areas twice a day for up to two weeks, avoid face.   No facility-administered encounter medications on file as of 06/04/2015.    No results found for this or any previous visit (from the past 2160 hour(s)).  BP 128/74 mmHg  Pulse 82  Ht 5\' 9"  (1.753 m)  Wt 238 lb (107.956 kg)  BMI 35.13 kg/m2  SpO2 95%   Review of Systems  Constitutional: Negative for fever.  Musculoskeletal: Positive for myalgias and joint pain.  Skin: Negative for rash.  Neurological: Negative for headaches.  Psychiatric/Behavioral: Negative for suicidal ideas.    Mental Status Examination  Appearance: casual and cooperative Alert: Yes Attention: fair  Cooperative: Yes Eye Contact: Good Speech: coherent Psychomotor Activity: Normal Memory/Concentration: adequate Oriented: person, place, time/date and situation Mood: Dysphoric at times. Not hopeless Affect: Constricted Thought Processes and Associations: Coherent Fund of Knowledge: Fair Thought Content: Suicidal ideation and Homicidal ideation were denied. Denies hallucinations Insight: Fair Judgement: Fair  Diagnosis: Major depressive disorder recurrent moderate to severe. Generalized anxiety disorder. Insomnia. 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 He needs a 90 day supply for depression and anxiety.  Continue Vistaril 1 tablet to 2 tablets when necessary for anxiety and stress. Has refill  Continue trazodone 50 mg when necessary for sleep or insomnia. Prescription given.   In regarding to arthritis he is, be taking IM injection every week and may get a wrist surgery.  Medical complexity: Mood also effected by pain. He is currently taking pain meds.    Pertinent Labs and Relevant Prior Notes reviewed. Medication Side effects, benefits and risks  reviewed/discussed with Patient. Time given for patient to respond and asks questions regarding the Diagnosis and Medications. Safety concerns and to report to ER if suicidal or call 911.  Follow up with Primary care provider in regards to Medical conditions. Recommend compliance with medications and follow up office appointments. Discussed to avail opportunity to consider or/and continue Individual therapy with Counselor. Greater than 50% of time was spend in counseling and coordination of care with the patient.  Schedule for Follow up visit in 2 to 3  months.  or call in earlier as necessary.  Time spent: 25 minutes  , MD 06/04/2015

## 2015-06-04 NOTE — Telephone Encounter (Signed)
Patient wants to know if you can refer him to a pain management doctor, in [place], if possible.  I explained to him that you have to put the referral in the system and then cindy would work on setting him up an appt.  thanks

## 2015-06-05 NOTE — Telephone Encounter (Signed)
Referral placed.

## 2015-06-05 NOTE — Telephone Encounter (Signed)
Pt.notified

## 2015-06-12 DIAGNOSIS — M5136 Other intervertebral disc degeneration, lumbar region: Secondary | ICD-10-CM | POA: Diagnosis not present

## 2015-06-12 DIAGNOSIS — G894 Chronic pain syndrome: Secondary | ICD-10-CM | POA: Diagnosis not present

## 2015-06-12 DIAGNOSIS — M0589 Other rheumatoid arthritis with rheumatoid factor of multiple sites: Secondary | ICD-10-CM | POA: Diagnosis not present

## 2015-06-12 DIAGNOSIS — M25532 Pain in left wrist: Secondary | ICD-10-CM | POA: Diagnosis not present

## 2015-06-12 DIAGNOSIS — M4322 Fusion of spine, cervical region: Secondary | ICD-10-CM | POA: Diagnosis not present

## 2015-06-26 ENCOUNTER — Other Ambulatory Visit: Payer: Self-pay

## 2015-06-26 MED ORDER — OXYCODONE-ACETAMINOPHEN 10-325 MG PO TABS: 1.0000 | ORAL_TABLET | Freq: Four times a day (QID) | ORAL | Status: DC | PRN

## 2015-07-29 ENCOUNTER — Other Ambulatory Visit: Payer: Self-pay

## 2015-07-29 DIAGNOSIS — M25532 Pain in left wrist: Secondary | ICD-10-CM | POA: Diagnosis not present

## 2015-07-29 DIAGNOSIS — G894 Chronic pain syndrome: Secondary | ICD-10-CM | POA: Diagnosis not present

## 2015-07-29 DIAGNOSIS — M0589 Other rheumatoid arthritis with rheumatoid factor of multiple sites: Secondary | ICD-10-CM | POA: Diagnosis not present

## 2015-07-29 DIAGNOSIS — M5136 Other intervertebral disc degeneration, lumbar region: Secondary | ICD-10-CM | POA: Diagnosis not present

## 2015-07-29 MED ORDER — OXYCODONE-ACETAMINOPHEN 10-325 MG PO TABS: 1.0000 | ORAL_TABLET | Freq: Four times a day (QID) | ORAL | Status: DC | PRN

## 2015-07-29 NOTE — Telephone Encounter (Signed)
[name], Rx placed in in-box ready for pickup/faxing.  

## 2015-07-29 NOTE — Telephone Encounter (Signed)
Pt called requesting a refill on the pended medication. Please advise if refill is appropriate.

## 2015-08-02 DIAGNOSIS — M06832 Other specified rheumatoid arthritis, left wrist: Secondary | ICD-10-CM | POA: Diagnosis not present

## 2015-08-02 DIAGNOSIS — M25532 Pain in left wrist: Secondary | ICD-10-CM | POA: Diagnosis not present

## 2015-08-13 DIAGNOSIS — M70032 Crepitant synovitis (acute) (chronic), left wrist: Secondary | ICD-10-CM | POA: Diagnosis not present

## 2015-08-13 DIAGNOSIS — G5602 Carpal tunnel syndrome, left upper limb: Secondary | ICD-10-CM | POA: Diagnosis not present

## 2015-08-19 DIAGNOSIS — R5383 Other fatigue: Secondary | ICD-10-CM | POA: Diagnosis not present

## 2015-08-19 DIAGNOSIS — Z79899 Other long term (current) drug therapy: Secondary | ICD-10-CM | POA: Diagnosis not present

## 2015-08-19 DIAGNOSIS — M0589 Other rheumatoid arthritis with rheumatoid factor of multiple sites: Secondary | ICD-10-CM | POA: Diagnosis not present

## 2015-08-26 DIAGNOSIS — G5602 Carpal tunnel syndrome, left upper limb: Secondary | ICD-10-CM | POA: Diagnosis not present

## 2015-08-27 ENCOUNTER — Encounter: Payer: Self-pay | Admitting: Family Medicine

## 2015-08-27 DIAGNOSIS — M79642 Pain in left hand: Secondary | ICD-10-CM | POA: Insufficient documentation

## 2015-08-29 ENCOUNTER — Ambulatory Visit (HOSPITAL_COMMUNITY): Payer: Self-pay | Admitting: Psychiatry

## 2015-08-29 ENCOUNTER — Other Ambulatory Visit: Payer: Self-pay

## 2015-08-29 DIAGNOSIS — G5602 Carpal tunnel syndrome, left upper limb: Secondary | ICD-10-CM | POA: Diagnosis not present

## 2015-08-29 DIAGNOSIS — M70032 Crepitant synovitis (acute) (chronic), left wrist: Secondary | ICD-10-CM | POA: Diagnosis not present

## 2015-08-29 MED ORDER — OXYCODONE-ACETAMINOPHEN 10-325 MG PO TABS: 1.0000 | ORAL_TABLET | Freq: Four times a day (QID) | ORAL | 0 refills | Status: DC | PRN

## 2015-09-06 ENCOUNTER — Encounter (HOSPITAL_COMMUNITY): Payer: Self-pay | Admitting: Psychiatry

## 2015-09-06 ENCOUNTER — Ambulatory Visit (INDEPENDENT_AMBULATORY_CARE_PROVIDER_SITE_OTHER): Payer: Medicare Other | Admitting: Psychiatry

## 2015-09-06 DIAGNOSIS — F331 Major depressive disorder, recurrent, moderate: Secondary | ICD-10-CM | POA: Diagnosis not present

## 2015-09-06 DIAGNOSIS — F411 Generalized anxiety disorder: Secondary | ICD-10-CM | POA: Diagnosis not present

## 2015-09-06 DIAGNOSIS — F4323 Adjustment disorder with mixed anxiety and depressed mood: Secondary | ICD-10-CM | POA: Diagnosis not present

## 2015-09-06 MED ORDER — ESCITALOPRAM OXALATE 10 MG PO TABS: 10.0000 mg | ORAL_TABLET | Freq: Every day | ORAL | 0 refills | Status: DC

## 2015-09-06 NOTE — Progress Notes (Signed)
Patient ID: [name], male   DOB: 27-Jun-1970, 45 y.o.   MRN: [id number] [facility] Health Follow-up Outpatient Visit  VERE DIANTONIO [id number] 45 y.o.  09/06/2015  Chief Complaint: follow up for depression and anxiety.     History of Present Illness:     HPI Comments: Mr. Tatsch is a 45 y/o male with a past psychiatric history significant for symptoms of anxiety and depression. The patient is referred for psychiatric services for medication management.    . Location: The patient  lost his mom in December 2015.Marland Kitchen Anniversaries worry him.   fiance has had a stroke but better. She suffers from pain. Stresses out at times relative to her condition. prozac was causing severe nausea and throwing up so he stopped. Apparently he is also on percocet but felt prozac was making him to throw Feeling down since last 2 weeks .    He is on pain medications for arthritis. Pain keeps him limited.  On disability.  Planning for wrist surgery.  Sleep remains poor unless he takes trazadone.  Modifying factors: his fiance is supportive but has recent a stroke. His disablity was approved 2016. Marland Kitchen Severity: Depression: 5/10 (0=Very depressed; 5=Neutral; 10=Very Happy) (worsened) Anxiety- 6/10 (0=no anxiety; 5= moderate/tolerable anxiety; 10= panic attacks)  . Duration: has had symptoms in his 20's . Fluctuated mood  At times.  . Timing: Mood still worse midday.  . Context: Finances, Disability process,. Aggravated by current medical issues. Ruminates about his stressors.  . Modifying factors: Improves with spending time with his children.  . Associated signs and symptoms:    Past Medical History:  Diagnosis Date  . Anxiety   . Arthritis   . Depression   . Essential hypertension 12/11/2011  . Gout   . History of lithotripsy   . Hypertension   . Kidney stones   . Lumbar degenerative disc disease 12/11/2011  . Nephrolithiasis 12/11/2011  . Pericarditis   . Renal  tubular acidosis   . Rheumatoid arthritis(714.0)    Family History  Problem Relation Age of Onset  . Hypertension Father   . Rheum arthritis Father   . Alcohol abuse Father   . Heart attack Father   . Diabetes Mellitus II Father   . Coronary artery disease Father   . Diabetes Mellitus II Mother   . Cancer - Colon Mother   . Coronary artery disease Mother   . Heart attack Mother   . Ovarian cancer Mother   . Uterine cancer Mother   . Dementia Neg Hx   . Bipolar disorder Neg Hx   . Depression Neg Hx   . Drug abuse Neg Hx   . OCD Neg Hx     Outpatient Encounter Prescriptions as of 09/06/2015  Medication Sig  . AMBULATORY NON FORMULARY MEDICATION Medication Name: Gilmer Mor, use as needed.  Dx: Rheumatoid Arthritis  . amLODipine (NORVASC) 10 MG tablet One tablet by mouth every day for blood pressure control.  . cyclobenzaprine (FLEXERIL) 10 MG tablet Take 1 tablet (10 mg total) by mouth 3 (three) times daily as needed for muscle spasms.  Marland Kitchen eletriptan (RELPAX) 40 MG tablet Take 1 tablet (40 mg total) by mouth as needed for migraine or headache. May repeat in 2 hours if headache persists or recurs.  Marland Kitchen escitalopram (LEXAPRO) 10 MG tablet Take 1 tablet (10 mg total) by mouth daily. Take half tablet a day for first 5 days then one  aday.  . Etanercept (ENBREL [place]) Inject into  the skin once a week.  . folic acid (FOLVITE) 1 MG tablet 1 mg.  . hydrochlorothiazide (HYDRODIURIL) 25 MG tablet One and one half tablet by mouth every morning for blood pressure control.  Marland Kitchen oxyCODONE-acetaminophen (PERCOCET) 10-325 MG tablet Take 1-1.5 tablets by mouth every 6 (six) hours as needed for pain.  . pantoprazole (PROTONIX) 40 MG tablet Take 1 tablet (40 mg total) by mouth daily.  . pregabalin (LYRICA) 50 MG capsule Take 1 capsule (50 mg total) by mouth daily.  . promethazine (PHENERGAN) 25 MG tablet Take 1 tablet (25 mg total) by mouth every 6 (six) hours as needed for nausea.  . ranitidine (ZANTAC) 150 MG  tablet One by mouth twice a day to prevent gastric reflux symptoms.  . traZODone (DESYREL) 50 MG tablet Take 1 tablet (50 mg total) by mouth at bedtime as needed for sleep. Take one-half to two tablets at bedtime.  . triamcinolone cream (KENALOG) 0.1 % Apply to affected areas twice a day for up to two weeks, avoid face.  . [DISCONTINUED] FLUoxetine (PROZAC) 40 MG capsule Take 1 capsule (40 mg total) by mouth 2 (two) times daily.   No facility-administered encounter medications on file as of 09/06/2015.     No results found for this or any previous visit (from the past 2160 hour(s)).  There were no vitals taken for this visit.   Review of Systems  Constitutional: Negative for fever.  Musculoskeletal: Positive for joint pain and myalgias.  Skin: Negative for rash.  Neurological: Negative for headaches.  Psychiatric/Behavioral: Positive for depression. Negative for suicidal ideas.    Mental Status Examination  Appearance: casual and cooperative Alert: Yes Attention: fair  Cooperative: Yes Eye Contact: Good Speech: coherent Psychomotor Activity: Normal Memory/Concentration: adequate Oriented: person, place, time/date and situation Mood: Dysphoric . Not hopeless Affect: Constricted Thought Processes and Associations: Coherent Fund of Knowledge: Fair Thought Content: Suicidal ideation and Homicidal ideation were denied. Denies hallucinations Insight: Fair Judgement: Fair  Diagnosis: Major depressive disorder recurrent moderate to severe. Generalized anxiety disorder. Insomnia. 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 Will start lexapro small dose of 5 and increase to 10 in one week.    Continue Vistaril 1 tablet to 2 tablets when necessary for anxiety and stress.   Continue trazodone 50 mg when necessary for sleep or insomnia. Has meds for now    In regarding to arthritis he is, be taking IM  injection every week and may get a wrist surgery.   Medical complexity: Mood also effected by pain. He is currently taking pain meds.    Pertinent Labs and Relevant Prior Notes reviewed. Medication Side effects, benefits and risks reviewed/discussed with Patient. Time given for patient to respond and asks questions regarding the Diagnosis and Medications. Safety concerns and to report to ER if suicidal or call 911.  Follow up with Primary care provider in regards to Medical conditions. Recommend compliance with medications and follow up office appointments. Discussed to avail opportunity to consider or/and continue Individual therapy with Counselor. Greater than 50% of time was spend in counseling and coordination of care with the patient. Discussed also factors of pain pending surgery and concern with his wife at home was going through recovery from stroke Schedule for Follow up visit in 1 month as we started new med but he wants to come back in  2 to 3  months.  or call in earlier as necessary.  Time spent: 25 minutes  Thresa Ross, MD

## 2015-09-10 DIAGNOSIS — M0589 Other rheumatoid arthritis with rheumatoid factor of multiple sites: Secondary | ICD-10-CM | POA: Diagnosis not present

## 2015-09-10 DIAGNOSIS — G894 Chronic pain syndrome: Secondary | ICD-10-CM | POA: Diagnosis not present

## 2015-09-10 DIAGNOSIS — M25532 Pain in left wrist: Secondary | ICD-10-CM | POA: Diagnosis not present

## 2015-09-10 DIAGNOSIS — M5136 Other intervertebral disc degeneration, lumbar region: Secondary | ICD-10-CM | POA: Diagnosis not present

## 2015-09-19 ENCOUNTER — Ambulatory Visit (HOSPITAL_COMMUNITY): Payer: Self-pay | Admitting: Psychiatry

## 2015-09-23 ENCOUNTER — Encounter (HOSPITAL_BASED_OUTPATIENT_CLINIC_OR_DEPARTMENT_OTHER): Admission: RE | Payer: Self-pay | Source: Ambulatory Visit

## 2015-09-23 ENCOUNTER — Ambulatory Visit (HOSPITAL_BASED_OUTPATIENT_CLINIC_OR_DEPARTMENT_OTHER): Admission: RE | Admit: 2015-09-23 | Payer: Medicare Other | Source: Ambulatory Visit | Admitting: Orthopedic Surgery

## 2015-09-23 SURGERY — CARPAL TUNNEL RELEASE
Anesthesia: General | Laterality: Left

## 2015-09-25 ENCOUNTER — Ambulatory Visit (INDEPENDENT_AMBULATORY_CARE_PROVIDER_SITE_OTHER): Payer: Medicare Other | Admitting: Family Medicine

## 2015-09-25 ENCOUNTER — Encounter: Payer: Self-pay | Admitting: Family Medicine

## 2015-09-25 VITALS — BP 153/99 | HR 79 | Wt 242.0 lb

## 2015-09-25 DIAGNOSIS — I10 Essential (primary) hypertension: Secondary | ICD-10-CM

## 2015-09-25 DIAGNOSIS — L723 Sebaceous cyst: Secondary | ICD-10-CM

## 2015-09-25 DIAGNOSIS — Z23 Encounter for immunization: Secondary | ICD-10-CM | POA: Diagnosis not present

## 2015-09-25 DIAGNOSIS — M069 Rheumatoid arthritis, unspecified: Secondary | ICD-10-CM

## 2015-09-25 MED ORDER — OXYCODONE-ACETAMINOPHEN 10-325 MG PO TABS: 1.0000 | ORAL_TABLET | Freq: Four times a day (QID) | ORAL | 0 refills | Status: DC | PRN

## 2015-09-25 MED ORDER — ELETRIPTAN HYDROBROMIDE 40 MG PO TABS: 40.0000 mg | ORAL_TABLET | ORAL | 1 refills | Status: DC | PRN

## 2015-09-25 MED ORDER — PANTOPRAZOLE SODIUM 40 MG PO TBEC: 40.0000 mg | DELAYED_RELEASE_TABLET | Freq: Every day | ORAL | 1 refills | Status: DC

## 2015-09-25 MED ORDER — CHLORTHALIDONE 50 MG PO TABS: 50.0000 mg | ORAL_TABLET | Freq: Every day | ORAL | 1 refills | Status: DC

## 2015-09-25 MED ORDER — AMLODIPINE BESYLATE 10 MG PO TABS: ORAL_TABLET | ORAL | 2 refills | Status: DC

## 2015-09-25 MED ORDER — CYCLOBENZAPRINE HCL 10 MG PO TABS: 10.0000 mg | ORAL_TABLET | Freq: Three times a day (TID) | ORAL | 5 refills | Status: DC | PRN

## 2015-09-25 NOTE — Progress Notes (Signed)
CC: [name] is a 45 y.o. male is here for Medication Management   Subjective: HPI:  This is sebaceous cyst in his buttock seems to be waxing and waning over the past month. Triamcinolone cream has not helped much. At the present time pain is absent however he wants it removed. He denies any skin changes elsewhere  Follow-up essential hypertension: He tells me that his rheumatologist has commented that his blood pressure is elevated at recent visits. No other readings to report. He is taking amlodipine and hydrochlorothiazide with 100% compliance. No chest pain shortness of breath orthopnea or peripheral edema.  Follow-up rheumatoid arthritis: Overall his joint pain is improving however he would like a refill on his pain medication. He denies any constipation or sedation. He denies any new joint pain and currently the pain is worse than his left wrist but he has a fusion planned for early next month.  Review Of Systems Outlined In HPI  Past Medical History:  Diagnosis Date  . Anxiety   . Arthritis   . Depression   . Essential hypertension 12/11/2011  . Gout   . History of lithotripsy   . Hypertension   . Kidney stones   . Lumbar degenerative disc disease 12/11/2011  . Nephrolithiasis 12/11/2011  . Pericarditis   . Renal tubular acidosis   . Rheumatoid arthritis(714.0)     Past Surgical History:  Procedure Laterality Date  . ANTERIOR CERVICAL DECOMP/DISCECTOMY FUSION    . HEMORROIDECTOMY    . KIDNEY STONE SURGERY     Family History  Problem Relation Age of Onset  . Hypertension Father   . Rheum arthritis Father   . Alcohol abuse Father   . Heart attack Father   . Diabetes Mellitus II Father   . Coronary artery disease Father   . Diabetes Mellitus II Mother   . Cancer - Colon Mother   . Coronary artery disease Mother   . Heart attack Mother   . Ovarian cancer Mother   . Uterine cancer Mother   . Dementia Neg Hx   . Bipolar disorder Neg Hx   . Depression Neg Hx    . Drug abuse Neg Hx   . OCD Neg Hx     Social History   Social History  . Marital status: Legally Separated    Spouse name: N/A  . Number of children: N/A  . Years of education: N/A   Occupational History  . Not on file.   Social History Main Topics  . Smoking status: Current Every Day Smoker    Packs/day: 0.50    Years: 29.00  . Smokeless tobacco: Never Used     Comment: Nicotrol  . Alcohol use No  . Drug use: No  . Sexual activity: Yes    Partners: Female   Other Topics Concern  . Not on file   Social History Narrative  . No narrative on file     Objective: BP (!) 153/99   Pulse 79   Wt 242 lb (109.8 kg)   BMI 35.74 kg/m   General: Alert and Oriented, No Acute Distress HEENT: Pupils equal, round, reactive to light. Conjunctivae clear. Moist mucous membranes Lungs: Clear to auscultation bilaterally, no wheezing/ronchi/rales.  Comfortable work of breathing. Good air movement. Cardiac: Regular rate and rhythm. Normal S1/S2.  No murmurs, rubs, nor gallops.   Extremities: No peripheral edema.  Strong peripheral pulses.  Mental Status: No depression, anxiety, nor agitation. Skin: Warm and dry.  Assessment & Plan:  [name] was seen today for medication management.  Diagnoses and all orders for this visit:  Sebaceous cyst -     Ambulatory referral to Dermatology  Essential hypertension -     amLODipine (NORVASC) 10 MG tablet; One tablet by mouth every day for blood pressure control.  Encounter for immunization -     Flu Vaccine QUAD 36+ mos IM  Rheumatoid arthritis of hand, unspecified laterality, unspecified rheumatoid factor presence (HCC)  Other orders -     chlorthalidone (HYGROTON) 50 MG tablet; Take 1 tablet (50 mg total) by mouth daily. -     oxyCODONE-acetaminophen (PERCOCET) 10-325 MG tablet; Take 1-1.5 tablets by mouth every 6 (six) hours as needed for pain. -     cyclobenzaprine (FLEXERIL) 10 MG tablet; Take 1 tablet (10 mg total) by mouth 3  (three) times daily as needed for muscle spasms. -     eletriptan (RELPAX) 40 MG tablet; Take 1 tablet (40 mg total) by mouth as needed for migraine or headache. May repeat in 2 hours if headache persists or recurs. -     pantoprazole (PROTONIX) 40 MG tablet; Take 1 tablet (40 mg total) by mouth daily.   Dermatology referral for removal of sebaceous cyst Pain medication refill today, he was referred to a pain management clinic in [place] but tells me he was never contacted about scheduling I will ask our scheduler to figure out how he can proceed with this referral. Essential hypertension: Uncontrolled continue amlodipine switching from hydrochlorothiazide to chlorthalidone  Discussed with this patient that I will be resigning from my position here with [facility] in September in order to stay with my family who will be moving to [facility]. I let him know about the providers that are still accepting patients and I feel that this individual will be under great care if he/she stays here with [facility].  Return in about 3 months (around 12/26/2015) for HTN Follow Up with Dr. Lyn Hollingshead or New Doctor.

## 2015-10-01 ENCOUNTER — Telehealth: Payer: Self-pay | Admitting: Family Medicine

## 2015-10-01 NOTE — Telephone Encounter (Signed)
Submitted PA for Relpax via CoverMyMeds. [employer] - PA Case ID: [id number] - Rx #: [id number]

## 2015-10-02 ENCOUNTER — Telehealth: Payer: Self-pay | Admitting: Family Medicine

## 2015-10-02 MED ORDER — NARATRIPTAN HCL 2.5 MG PO TABS: 2.5000 mg | ORAL_TABLET | ORAL | 0 refills | Status: DC | PRN

## 2015-10-02 NOTE — Telephone Encounter (Signed)
Insurance required additional information. Faxed form completed and sent back.

## 2015-10-02 NOTE — Telephone Encounter (Signed)
Relpax Rx denied. Pt must try and fail two of the following preferred: naratriptan and rizatriptan. Will route to Provider.

## 2015-10-02 NOTE — Telephone Encounter (Signed)
Will you please let patient know that medicare is not covering eletriptan but they will cover a similar medication called naratriptan for his headaches.  New Rx in your in box.

## 2015-10-04 NOTE — Telephone Encounter (Signed)
Pt notified rx faxed.

## 2015-10-25 ENCOUNTER — Telehealth: Payer: Self-pay

## 2015-10-25 NOTE — Telephone Encounter (Signed)
I apologize but I do not do chronic narcotic pain management. Have whomever look into what happened with the referral, what probably happened is the patient did not follow through since it looks like the referral was sent twice back in May.

## 2015-10-25 NOTE — Telephone Encounter (Signed)
[name] called for a refill of oxycodone. He is a former patient of Dr Ivan Anchors. He has not schedule an establish care appointment with a provider. He is due for the refill of the oxycodone. He still has not been contacted by pain management. Not sure what happened to the referral.

## 2015-10-28 NOTE — Telephone Encounter (Signed)
Pt advised of denied narcotic Rx. Provided Pt with phone number to pain med clinic for him to contact scheduling.

## 2015-10-30 NOTE — Pre-Procedure Instructions (Signed)
    LIBAN GUEDES  10/30/2015      [facility] [address] [place] - [address] [place] [address] Phone: [phone] Fax: [phone]  [facility] - [place], [place] - [address] [place] [address] Phone: [phone] Fax: [phone]    Your procedure is scheduled on Wednesday October 11.  Report to [facility] Admitting at 11:30 A.M.  Call this number if you have problems the morning of surgery:  [phone]   Remember:  Do not eat food or drink liquids after midnight.  Take these medicines the morning of surgery with A SIP OF WATER: amlodipine (norvasc),  escitalopram (Lexapro),   7 days prior to surgery STOP taking any Aspirin, Aleve, Naproxen, Ibuprofen, Motrin, Advil, Goody's, BC's, all herbal medications, fish oil, and all vitamins    Do not wear jewelry.  Do not wear lotions, powders, or cologne, or deoderant.  Do not shave 48 hours prior to surgery.  Men may shave face and neck.  Do not bring valuables to the hospital.  [facility] is not responsible for any belongings or valuables.  Contacts, dentures or bridgework may not be worn into surgery.  Leave your suitcase in the car.  After surgery it may be brought to your room.  For patients admitted to the hospital, discharge time will be determined by your treatment team.  Patients discharged the day of surgery will not be allowed to drive home.    Special instruction: review all handouts   Please read over the following fact sheets that you were given. Coughing and Deep Breathing

## 2015-10-31 ENCOUNTER — Encounter (HOSPITAL_COMMUNITY): Payer: Self-pay

## 2015-10-31 ENCOUNTER — Encounter (HOSPITAL_COMMUNITY)
Admission: RE | Admit: 2015-10-31 | Discharge: 2015-10-31 | Disposition: A | Discharge status: Home / Self Care | Payer: Medicare Other | Source: Ambulatory Visit | Attending: Orthopedic Surgery | Admitting: Orthopedic Surgery

## 2015-10-31 DIAGNOSIS — G5602 Carpal tunnel syndrome, left upper limb: Secondary | ICD-10-CM | POA: Insufficient documentation

## 2015-10-31 DIAGNOSIS — Z01810 Encounter for preprocedural cardiovascular examination: Secondary | ICD-10-CM | POA: Diagnosis not present

## 2015-10-31 DIAGNOSIS — Z01812 Encounter for preprocedural laboratory examination: Secondary | ICD-10-CM | POA: Diagnosis not present

## 2015-10-31 DIAGNOSIS — M13832 Other specified arthritis, left wrist: Secondary | ICD-10-CM | POA: Diagnosis not present

## 2015-10-31 HISTORY — DX: Headache, unspecified: R51.9

## 2015-10-31 HISTORY — DX: Personal history of other diseases of the digestive system: Z87.19

## 2015-10-31 HISTORY — DX: Headache: R51

## 2015-10-31 HISTORY — DX: Unspecified asthma, uncomplicated: J45.909

## 2015-10-31 LAB — CBC
HCT: 45 % (ref 39.0–52.0)
Hemoglobin: 15.2 g/dL (ref 13.0–17.0)
MCH: 29.8 pg (ref 26.0–34.0)
MCHC: 33.8 g/dL (ref 30.0–36.0)
MCV: 88.2 fL (ref 78.0–100.0)
Platelets: 322 10*3/uL (ref 150–400)
RBC: 5.1 MIL/uL (ref 4.22–5.81)
RDW: 13.6 % (ref 11.5–15.5)
WBC: 7.8 10*3/uL (ref 4.0–10.5)

## 2015-10-31 LAB — BASIC METABOLIC PANEL
Anion gap: 8 (ref 5–15)
BUN: 8 mg/dL (ref 6–20)
CO2: 23 mmol/L (ref 22–32)
Calcium: 9.4 mg/dL (ref 8.9–10.3)
Chloride: 108 mmol/L (ref 101–111)
Creatinine, Ser: 0.92 mg/dL (ref 0.61–1.24)
GFR calc Af Amer: >60 mL/min (ref >60)
GFR calc non Af Amer: >60 mL/min (ref >60)
Glucose, Bld: 94 mg/dL (ref 65–99)
Potassium: 4.3 mmol/L (ref 3.5–5.1)
Sodium: 139 mmol/L (ref 135–145)

## 2015-10-31 NOTE — Progress Notes (Signed)
PCP: Dr. Leola Brazil

## 2015-10-31 NOTE — Progress Notes (Signed)
   10/31/15 0936  OBSTRUCTIVE SLEEP APNEA  Have you ever been diagnosed with sleep apnea through a sleep study? No  Do you snore loudly (loud enough to be heard through closed doors)?  1  Do you often feel tired, fatigued, or sleepy during the daytime (such as falling asleep during driving or talking to someone)? 0  Has anyone observed you stop breathing during your sleep? 1  Do you have, or are you being treated for high blood pressure? 1  BMI more than 35 kg/m2? 1  Age > 50 (1-yes) 0  Neck circumference greater than:Male 16 inches or larger, Male 17inches or larger? 1  Male Gender (Yes=1) 1  Obstructive Sleep Apnea Score 6  Score 5 or greater  Results sent to PCP

## 2015-11-04 ENCOUNTER — Ambulatory Visit (HOSPITAL_COMMUNITY): Payer: Self-pay | Admitting: Psychiatry

## 2015-11-05 ENCOUNTER — Encounter: Payer: Self-pay | Admitting: Osteopathic Medicine

## 2015-11-05 ENCOUNTER — Telehealth: Payer: Self-pay | Admitting: Osteopathic Medicine

## 2015-11-05 ENCOUNTER — Ambulatory Visit (INDEPENDENT_AMBULATORY_CARE_PROVIDER_SITE_OTHER): Payer: Medicare Other | Admitting: Osteopathic Medicine

## 2015-11-05 VITALS — BP 149/94 | HR 77 | Ht 67.0 in | Wt 233.0 lb

## 2015-11-05 DIAGNOSIS — M5136 Other intervertebral disc degeneration, lumbar region: Secondary | ICD-10-CM

## 2015-11-05 DIAGNOSIS — M2550 Pain in unspecified joint: Secondary | ICD-10-CM | POA: Diagnosis not present

## 2015-11-05 DIAGNOSIS — M5030 Other cervical disc degeneration, unspecified cervical region: Secondary | ICD-10-CM | POA: Diagnosis not present

## 2015-11-05 DIAGNOSIS — M069 Rheumatoid arthritis, unspecified: Secondary | ICD-10-CM | POA: Diagnosis not present

## 2015-11-05 DIAGNOSIS — G8929 Other chronic pain: Secondary | ICD-10-CM

## 2015-11-05 MED ORDER — OXYCODONE-ACETAMINOPHEN 10-325 MG PO TABS: 1.0000 | ORAL_TABLET | Freq: Four times a day (QID) | ORAL | 0 refills | Status: DC | PRN

## 2015-11-05 NOTE — Patient Instructions (Signed)
Please call us if you have not heard by Friday about a referral to pain management.   Your orthopedist will prescribe pain medicines as they see appropriate for your post-op pain.   Our office does not prescribe chronic pain medications - we will give limited number of opioids but pain management will need to take over from there.

## 2015-11-05 NOTE — Telephone Encounter (Signed)
-----   Message from Sunnie Nielsen, DO sent at 11/05/2015 10:27 AM EDT ----- Regarding: Referral drama We were dealing with a referral for this patient to pain management since May 2017, looks like nothing has been sent from our end, but patient says that when he calls them they haven't received anything from [place]. Can we look into this one more time and if there are any way that they can get him seen ASAP, that would be awesome. If not, we will need to refer him somewhere else, just let me know.

## 2015-11-05 NOTE — Progress Notes (Signed)
HPI: [name] is a 45 y.o. male  who presents to [facility] today, 11/05/15,  for chief complaint of:  Chief Complaint  Patient presents with  . Establish Care    Requests pain medication, history of osteoarthritis, rheumatoid arthritis.      . Context: History of osteoarthritis, rheumatoid arthritis, referral to pain management has had some problems as far as multiple referrals being sent, patient says that when he called and they haven't received anything. He is here today stating that he would like a refill on pain medications. Dr. Ivan Anchors had been giving him these before. Last seen by Dr. Ivan Anchors 09/25/2015 - Percocet was refilled. Patient requested refill 10/23/2015 and was encouraged to come in for a visit. . Location: Pain in multiple joints of the body. Currently scheduled to undergo wrist surgery tomorrow . Quality: soreness, joint pain . Severity: severe . Duration: years . Timing: constant . Modifying factors: Following with rheumatology, on Humira and methotrexate for rheumatoid arthritis.  Past medical, surgical, social and family history reviewed: Past Medical History:  Diagnosis Date  . Anxiety   . Arthritis   . Asthma    history [id number]  . Depression   . Essential hypertension 12/11/2011  . Gout   . Headache   . History of hiatal hernia   . History of lithotripsy   . Hypertension   . Kidney stones   . Lumbar degenerative disc disease 12/11/2011  . Nephrolithiasis 12/11/2011  . Pericarditis   . Renal tubular acidosis   . Rheumatoid arthritis(714.0)    Past Surgical History:  Procedure Laterality Date  . ANTERIOR CERVICAL DECOMP/DISCECTOMY FUSION    . HEMORROIDECTOMY    . KIDNEY STONE SURGERY     Social History  Substance Use Topics  . Smoking status: Current Every Day Smoker    Packs/day: 0.50    Years: 29.00  . Smokeless tobacco: Never Used     Comment: Nicotrol  . Alcohol use No   Family History   Problem Relation Age of Onset  . Hypertension Father   . Rheum arthritis Father   . Alcohol abuse Father   . Heart attack Father   . Diabetes Mellitus II Father   . Coronary artery disease Father   . Diabetes Mellitus II Mother   . Cancer - Colon Mother   . Coronary artery disease Mother   . Heart attack Mother   . Ovarian cancer Mother   . Uterine cancer Mother   . Dementia Neg Hx   . Bipolar disorder Neg Hx   . Depression Neg Hx   . Drug abuse Neg Hx   . OCD Neg Hx      Current medication list and allergy/intolerance information reviewed:   Current Outpatient Prescriptions  Medication Sig Dispense Refill  . Adalimumab (HUMIRA PEN [place]) Inject into the skin.    Marland Kitchen AMBULATORY NON FORMULARY MEDICATION Medication Name: Gilmer Mor, use as needed.  Dx: Rheumatoid Arthritis 1 Units 0  . amLODipine (NORVASC) 10 MG tablet One tablet by mouth every day for blood pressure control. 90 tablet 2  . chlorthalidone (HYGROTON) 50 MG tablet Take 1 tablet (50 mg total) by mouth daily. 90 tablet 1  . cyclobenzaprine (FLEXERIL) 10 MG tablet Take 1 tablet (10 mg total) by mouth 3 (three) times daily as needed for muscle spasms. 90 tablet 5  . eletriptan (RELPAX) 40 MG tablet Take 1 tablet (40 mg total) by mouth as needed for migraine or headache.  May repeat in 2 hours if headache persists or recurs. 6 tablet 1  . escitalopram (LEXAPRO) 10 MG tablet Take 1 tablet (10 mg total) by mouth daily. Take half tablet a day for first 5 days then one  aday. (Patient taking differently: Take 10 mg by mouth daily. ) 30 tablet 0  . methotrexate (RHEUMATREX) 2.5 MG tablet Take 20 mg by mouth once a week. Caution:Chemotherapy. Protect from light.    . naratriptan (AMERGE) 2.5 MG tablet Take 1 tablet (2.5 mg total) by mouth as needed for migraine. Take one (1) tablet at onset of headache; if returns or does not resolve, may repeat after 4 hours; do not exceed five (5) mg in 24 hours. 10 tablet 0  . oxyCODONE-acetaminophen  (PERCOCET) 10-325 MG tablet Take 1 tablet by mouth every 6 (six) hours as needed for pain. 10 tablet 0  . pantoprazole (PROTONIX) 40 MG tablet Take 1 tablet (40 mg total) by mouth daily. 90 tablet 1  . pregabalin (LYRICA) 50 MG capsule Take 1 capsule (50 mg total) by mouth daily. 90 capsule 2  . traZODone (DESYREL) 50 MG tablet Take 1 tablet (50 mg total) by mouth at bedtime as needed for sleep. Take one-half to two tablets at bedtime. 90 tablet 0  . triamcinolone cream (KENALOG) 0.1 % Apply to affected areas twice a day for up to two weeks, avoid face. 80 g 0  . ranitidine (ZANTAC) 150 MG tablet One by mouth twice a day to prevent gastric reflux symptoms. (Patient not taking: Reported on 10/29/2015) 180 tablet 2   No current facility-administered medications for this visit.    Allergies  Allergen Reactions  . Benadryl [Diphenhydramine Hcl] Other (See Comments)    Causes severe anxiety  . Benzocaine Hives    blisters  . Lisinopril     COUGH      Review of Systems:  Constitutional:  No  fever, no chills, No recent illness  HEENT: No  headache, no vision change  Cardiac: No  chest pain  Respiratory:  No  shortness of breath  Gastrointestinal: No  abdominal pain  Musculoskeletal: No new myalgia/arthralgia - chronic joint pain throughout the body  Skin: No  Rash, No other wounds/concerning lesions  Neurologic: No  weakness, No  dizziness  Exam:  BP (!) 149/94   Pulse 77   Ht 5\' 7"  (1.702 m)   Wt 233 lb (105.7 kg)   BMI 36.49 kg/m   Constitutional: VS see above. General Appearance: alert, well-developed, well-nourished, NAD  Ears, Nose, Mouth, Throat: MMM,  Neck: No masses, trachea midline.  Respiratory: Normal respiratory effort.   Musculoskeletal: Gait normal. No clubbing/cyanosis of digits. Wearing left wrist brace  Neurological: Normal balance/coordination. No tremor.   Skin: warm, dry, intact. No rash/ulcer.  Psychiatric: Normal judgment/insight. Normal mood  and affect. Oriented x3.      ASSESSMENT/PLAN: Gave limited number of refills, patient advised that surgical team would manage postoperative pain. Given the complicated nature of his pain syndrome, will revisit the issue of pain management referral. I have sent a message to our staff to look into this one more time, may need to consider referring him to alternative office since there's been such trouble scheduling him. I advised the patient that her office does not typically manage chronic pain, await pain management referral, given history of documented rheumatoid pain and osteoarthritis I would consider cautious use of opiate therapy for this patient, would like pain management's opinion as they may be  able to offer other interventions since polypharmacy is already an issue in this patient.   Chronic pain of multiple joints  Rheumatoid arthritis of hand, unspecified laterality, unspecified rheumatoid factor presence (HCC)  Lumbar degenerative disc disease  Degenerative disc disease, cervical    Patient Instructions  Please call us if you have not heard by Friday about a referral to pain management.   Your orthopedist will prescribe pain medicines as they see appropriate for your post-op pain.   Our office does not prescribe chronic pain medications - we will give limited number of opioids but pain management will need to take over from there.    Visit summary with medication list and pertinent instructions was printed for patient to review. All questions at time of visit were answered - patient instructed to contact office with any additional concerns. ER/RTC precautions were reviewed with the patient. Follow-up plan: Return for ANNUAL PHYSICAL at your convenience.

## 2015-11-06 ENCOUNTER — Ambulatory Visit (HOSPITAL_COMMUNITY)
Admission: AD | Admit: 2015-11-06 | Discharge: 2015-11-07 | Disposition: A | Discharge status: Home / Self Care | Payer: Medicare Other | Source: Ambulatory Visit | Attending: Orthopedic Surgery | Admitting: Orthopedic Surgery

## 2015-11-06 ENCOUNTER — Ambulatory Visit (HOSPITAL_COMMUNITY): Payer: Medicare Other | Admitting: Anesthesiology

## 2015-11-06 ENCOUNTER — Encounter (HOSPITAL_COMMUNITY): Payer: Self-pay | Admitting: General Practice

## 2015-11-06 ENCOUNTER — Encounter (HOSPITAL_COMMUNITY)
Admission: AD | Disposition: A | Discharge status: Home / Self Care | Payer: Self-pay | Source: Ambulatory Visit | Attending: Orthopedic Surgery

## 2015-11-06 DIAGNOSIS — F329 Major depressive disorder, single episode, unspecified: Secondary | ICD-10-CM | POA: Diagnosis not present

## 2015-11-06 DIAGNOSIS — M109 Gout, unspecified: Secondary | ICD-10-CM | POA: Diagnosis not present

## 2015-11-06 DIAGNOSIS — M19032 Primary osteoarthritis, left wrist: Secondary | ICD-10-CM | POA: Diagnosis not present

## 2015-11-06 DIAGNOSIS — G5602 Carpal tunnel syndrome, left upper limb: Secondary | ICD-10-CM | POA: Diagnosis not present

## 2015-11-06 DIAGNOSIS — N2589 Other disorders resulting from impaired renal tubular function: Secondary | ICD-10-CM | POA: Diagnosis not present

## 2015-11-06 DIAGNOSIS — M069 Rheumatoid arthritis, unspecified: Secondary | ICD-10-CM | POA: Diagnosis not present

## 2015-11-06 DIAGNOSIS — G8918 Other acute postprocedural pain: Secondary | ICD-10-CM | POA: Diagnosis not present

## 2015-11-06 DIAGNOSIS — Z888 Allergy status to other drugs, medicaments and biological substances status: Secondary | ICD-10-CM | POA: Diagnosis not present

## 2015-11-06 DIAGNOSIS — Z87442 Personal history of urinary calculi: Secondary | ICD-10-CM | POA: Diagnosis not present

## 2015-11-06 DIAGNOSIS — M5136 Other intervertebral disc degeneration, lumbar region: Secondary | ICD-10-CM | POA: Insufficient documentation

## 2015-11-06 DIAGNOSIS — F419 Anxiety disorder, unspecified: Secondary | ICD-10-CM | POA: Insufficient documentation

## 2015-11-06 DIAGNOSIS — I10 Essential (primary) hypertension: Secondary | ICD-10-CM | POA: Diagnosis not present

## 2015-11-06 DIAGNOSIS — F17210 Nicotine dependence, cigarettes, uncomplicated: Secondary | ICD-10-CM | POA: Diagnosis not present

## 2015-11-06 DIAGNOSIS — M70032 Crepitant synovitis (acute) (chronic), left wrist: Secondary | ICD-10-CM | POA: Diagnosis not present

## 2015-11-06 HISTORY — PX: CARPAL TUNNEL RELEASE: SHX101

## 2015-11-06 HISTORY — PX: WRIST FUSION: SHX839

## 2015-11-06 SURGERY — CARPAL TUNNEL RELEASE
Anesthesia: General | Laterality: Left

## 2015-11-06 MED ORDER — CEFAZOLIN SODIUM-DEXTROSE 2-4 GM/100ML-% IV SOLN
2.0000 g | INTRAVENOUS | Status: AC
  Administered 2015-11-06: 2 g via INTRAVENOUS
  Filled 2015-11-06: qty 100

## 2015-11-06 MED ORDER — ONDANSETRON HCL 4 MG/2ML IJ SOLN: 4.0000 mg | Freq: Once | INTRAMUSCULAR | Status: DC | PRN

## 2015-11-06 MED ORDER — OXYCODONE HCL 5 MG/5ML PO SOLN: 5.0000 mg | Freq: Once | ORAL | Status: DC | PRN

## 2015-11-06 MED ORDER — CEFAZOLIN IN D5W 1 GM/50ML IV SOLN
1.0000 g | INTRAVENOUS | Status: AC
  Filled 2015-11-06: qty 50

## 2015-11-06 MED ORDER — HYDROMORPHONE HCL 1 MG/ML IJ SOLN: 0.2500 mg | INTRAMUSCULAR | Status: DC | PRN

## 2015-11-06 MED ORDER — PROPOFOL 10 MG/ML IV BOLUS
INTRAVENOUS | Status: AC
  Filled 2015-11-06: qty 20

## 2015-11-06 MED ORDER — ALPRAZOLAM 0.5 MG PO TABS: 0.5000 mg | ORAL_TABLET | Freq: Four times a day (QID) | ORAL | Status: DC | PRN

## 2015-11-06 MED ORDER — HYDROMORPHONE HCL 1 MG/ML IJ SOLN: 0.5000 mg | INTRAMUSCULAR | Status: DC | PRN

## 2015-11-06 MED ORDER — DEXAMETHASONE SODIUM PHOSPHATE 10 MG/ML IJ SOLN
INTRAMUSCULAR | Status: DC | PRN
  Administered 2015-11-06: 10 mg via INTRAVENOUS

## 2015-11-06 MED ORDER — DEXAMETHASONE SODIUM PHOSPHATE 10 MG/ML IJ SOLN
INTRAMUSCULAR | Status: AC
  Filled 2015-11-06: qty 1

## 2015-11-06 MED ORDER — MIDAZOLAM HCL 2 MG/2ML IJ SOLN
2.0000 mg | Freq: Once | INTRAMUSCULAR | Status: AC
  Administered 2015-11-06: 2 mg via INTRAVENOUS

## 2015-11-06 MED ORDER — CHLORTHALIDONE 50 MG PO TABS
50.0000 mg | ORAL_TABLET | Freq: Every day | ORAL | Status: DC
  Administered 2015-11-06 – 2015-11-07 (×2): 50 mg via ORAL
  Filled 2015-11-06 (×2): qty 1

## 2015-11-06 MED ORDER — FENTANYL CITRATE (PF) 100 MCG/2ML IJ SOLN
INTRAMUSCULAR | Status: AC
  Filled 2015-11-06: qty 2

## 2015-11-06 MED ORDER — MIDAZOLAM HCL 2 MG/2ML IJ SOLN
INTRAMUSCULAR | Status: DC | PRN
  Administered 2015-11-06: 2 mg via INTRAVENOUS

## 2015-11-06 MED ORDER — EPHEDRINE SULFATE 50 MG/ML IJ SOLN
INTRAMUSCULAR | Status: DC | PRN
  Administered 2015-11-06: 15 mg via INTRAVENOUS

## 2015-11-06 MED ORDER — OXYCODONE HCL 5 MG PO TABS: 5.0000 mg | ORAL_TABLET | Freq: Once | ORAL | Status: DC | PRN

## 2015-11-06 MED ORDER — VITAMIN C 500 MG PO TABS
1000.0000 mg | ORAL_TABLET | Freq: Every day | ORAL | Status: DC
  Administered 2015-11-06 – 2015-11-07 (×2): 1000 mg via ORAL
  Filled 2015-11-06 (×2): qty 2

## 2015-11-06 MED ORDER — ADULT MULTIVITAMIN W/MINERALS CH
1.0000 | ORAL_TABLET | Freq: Every day | ORAL | Status: DC
Start: 2015-11-06 — End: 2015-11-07
  Administered 2015-11-06 – 2015-11-07 (×2): 1 via ORAL
  Filled 2015-11-06 (×2): qty 1

## 2015-11-06 MED ORDER — PANTOPRAZOLE SODIUM 40 MG PO TBEC
40.0000 mg | DELAYED_RELEASE_TABLET | Freq: Every day | ORAL | Status: DC
  Administered 2015-11-06 – 2015-11-07 (×2): 40 mg via ORAL
  Filled 2015-11-06 (×2): qty 1

## 2015-11-06 MED ORDER — HYDROCODONE-ACETAMINOPHEN 10-325 MG PO TABS
1.0000 | ORAL_TABLET | ORAL | Status: DC | PRN
  Administered 2015-11-07: 2 via ORAL
  Filled 2015-11-06: qty 2

## 2015-11-06 MED ORDER — AMLODIPINE BESYLATE 10 MG PO TABS
10.0000 mg | ORAL_TABLET | Freq: Every day | ORAL | Status: DC
  Administered 2015-11-07: 10 mg via ORAL
  Filled 2015-11-06 (×2): qty 1

## 2015-11-06 MED ORDER — ONDANSETRON HCL 4 MG/2ML IJ SOLN
INTRAMUSCULAR | Status: AC
  Filled 2015-11-06: qty 2

## 2015-11-06 MED ORDER — OXYCODONE-ACETAMINOPHEN 10-325 MG PO TABS: 1.0000 | ORAL_TABLET | ORAL | 0 refills | Status: DC | PRN

## 2015-11-06 MED ORDER — PHENYLEPHRINE HCL 10 MG/ML IJ SOLN
INTRAMUSCULAR | Status: DC | PRN
  Administered 2015-11-06 (×2): 40 ug via INTRAVENOUS

## 2015-11-06 MED ORDER — DOCUSATE SODIUM 100 MG PO CAPS
100.0000 mg | ORAL_CAPSULE | Freq: Two times a day (BID) | ORAL | Status: DC
  Administered 2015-11-06 – 2015-11-07 (×2): 100 mg via ORAL
  Filled 2015-11-06 (×2): qty 1

## 2015-11-06 MED ORDER — ELETRIPTAN HYDROBROMIDE 40 MG PO TABS
40.0000 mg | ORAL_TABLET | ORAL | Status: DC | PRN
  Administered 2015-11-06: 40 mg via ORAL
  Filled 2015-11-06 (×2): qty 1

## 2015-11-06 MED ORDER — PREGABALIN 50 MG PO CAPS: 50.0000 mg | ORAL_CAPSULE | Freq: Every day | ORAL | Status: DC

## 2015-11-06 MED ORDER — METHOCARBAMOL 500 MG PO TABS: 500.0000 mg | ORAL_TABLET | Freq: Three times a day (TID) | ORAL | 0 refills | Status: DC

## 2015-11-06 MED ORDER — CYCLOBENZAPRINE HCL 10 MG PO TABS: 10.0000 mg | ORAL_TABLET | Freq: Three times a day (TID) | ORAL | Status: DC | PRN

## 2015-11-06 MED ORDER — ONDANSETRON HCL 4 MG/2ML IJ SOLN: 4.0000 mg | Freq: Four times a day (QID) | INTRAMUSCULAR | Status: DC | PRN

## 2015-11-06 MED ORDER — CHLORHEXIDINE GLUCONATE 4 % EX LIQD: 60.0000 mL | Freq: Once | CUTANEOUS | Status: DC

## 2015-11-06 MED ORDER — CEFAZOLIN IN D5W 1 GM/50ML IV SOLN
1.0000 g | Freq: Three times a day (TID) | INTRAVENOUS | Status: DC
  Administered 2015-11-07: 1 g via INTRAVENOUS
  Filled 2015-11-06 (×3): qty 50

## 2015-11-06 MED ORDER — OXYCODONE HCL 5 MG PO TABS
5.0000 mg | ORAL_TABLET | ORAL | Status: DC | PRN
  Administered 2015-11-07 (×3): 10 mg via ORAL
  Filled 2015-11-06 (×3): qty 2

## 2015-11-06 MED ORDER — VITAMIN C 500 MG PO TABS: 500.0000 mg | ORAL_TABLET | Freq: Every day | ORAL | 0 refills | Status: DC

## 2015-11-06 MED ORDER — MIDAZOLAM HCL 2 MG/2ML IJ SOLN
INTRAMUSCULAR | Status: AC
  Filled 2015-11-06: qty 2

## 2015-11-06 MED ORDER — PROPOFOL 10 MG/ML IV BOLUS
INTRAVENOUS | Status: DC | PRN
  Administered 2015-11-06: 190 mg via INTRAVENOUS

## 2015-11-06 MED ORDER — LIDOCAINE HCL (CARDIAC) 20 MG/ML IV SOLN
INTRAVENOUS | Status: DC | PRN
  Administered 2015-11-06: 60 mg via INTRAVENOUS

## 2015-11-06 MED ORDER — PHENYLEPHRINE 40 MCG/ML (10ML) SYRINGE FOR IV PUSH (FOR BLOOD PRESSURE SUPPORT)
PREFILLED_SYRINGE | INTRAVENOUS | Status: AC
  Filled 2015-11-06: qty 10

## 2015-11-06 MED ORDER — FAMOTIDINE 20 MG PO TABS: 20.0000 mg | ORAL_TABLET | Freq: Two times a day (BID) | ORAL | Status: DC | PRN

## 2015-11-06 MED ORDER — TRAZODONE HCL 50 MG PO TABS
50.0000 mg | ORAL_TABLET | Freq: Every evening | ORAL | Status: DC | PRN
Start: 2015-11-06 — End: 2015-11-06

## 2015-11-06 MED ORDER — KCL IN DEXTROSE-NACL 20-5-0.45 MEQ/L-%-% IV SOLN: INTRAVENOUS | Status: DC

## 2015-11-06 MED ORDER — 0.9 % SODIUM CHLORIDE (POUR BTL) OPTIME
TOPICAL | Status: DC | PRN
  Administered 2015-11-06: 1000 mL

## 2015-11-06 MED ORDER — DOCUSATE SODIUM 100 MG PO CAPS: 100.0000 mg | ORAL_CAPSULE | Freq: Two times a day (BID) | ORAL | 0 refills | Status: DC

## 2015-11-06 MED ORDER — NICOTINE 14 MG/24HR TD PT24
14.0000 mg | MEDICATED_PATCH | Freq: Every day | TRANSDERMAL | Status: DC
  Administered 2015-11-06 – 2015-11-07 (×2): 14 mg via TRANSDERMAL
  Filled 2015-11-06 (×2): qty 1

## 2015-11-06 MED ORDER — BUPIVACAINE-EPINEPHRINE 0.25% -1:200000 IJ SOLN
INTRAMUSCULAR | Status: AC
  Filled 2015-11-06: qty 1

## 2015-11-06 MED ORDER — ESCITALOPRAM OXALATE 10 MG PO TABS
10.0000 mg | ORAL_TABLET | Freq: Every day | ORAL | Status: DC
  Administered 2015-11-06 – 2015-11-07 (×2): 10 mg via ORAL
  Filled 2015-11-06 (×2): qty 1

## 2015-11-06 MED ORDER — FENTANYL CITRATE (PF) 100 MCG/2ML IJ SOLN
INTRAMUSCULAR | Status: DC | PRN
  Administered 2015-11-06: 25 ug via INTRAVENOUS
  Administered 2015-11-06: 50 ug via INTRAVENOUS
  Administered 2015-11-06: 25 ug via INTRAVENOUS
  Administered 2015-11-06: 100 ug via INTRAVENOUS
  Administered 2015-11-06 (×2): 25 ug via INTRAVENOUS

## 2015-11-06 MED ORDER — ONDANSETRON HCL 4 MG PO TABS: 4.0000 mg | ORAL_TABLET | Freq: Four times a day (QID) | ORAL | Status: DC | PRN

## 2015-11-06 MED ORDER — LACTATED RINGERS IV SOLN
INTRAVENOUS | Status: DC
  Administered 2015-11-06 (×3): via INTRAVENOUS

## 2015-11-06 MED ORDER — METHOCARBAMOL 1000 MG/10ML IJ SOLN
500.0000 mg | Freq: Four times a day (QID) | INTRAVENOUS | Status: DC | PRN
  Filled 2015-11-06: qty 5

## 2015-11-06 MED ORDER — FENTANYL CITRATE (PF) 100 MCG/2ML IJ SOLN
100.0000 ug | Freq: Once | INTRAMUSCULAR | Status: AC
  Administered 2015-11-06: 100 ug via INTRAVENOUS

## 2015-11-06 MED ORDER — ONDANSETRON HCL 4 MG/2ML IJ SOLN
INTRAMUSCULAR | Status: DC | PRN
  Administered 2015-11-06: 4 mg via INTRAVENOUS

## 2015-11-06 MED ORDER — METHOCARBAMOL 500 MG PO TABS
500.0000 mg | ORAL_TABLET | Freq: Four times a day (QID) | ORAL | Status: DC | PRN
  Administered 2015-11-07 (×2): 500 mg via ORAL
  Filled 2015-11-06 (×3): qty 1

## 2015-11-06 MED ORDER — METHOTREXATE 2.5 MG PO TABS: 20.0000 mg | ORAL_TABLET | ORAL | Status: DC

## 2015-11-06 SURGICAL SUPPLY — 88 items
BANDAGE ACE 4X5 VEL STRL LF (GAUZE/BANDAGES/DRESSINGS) ×2 IMPLANT
BANDAGE ELASTIC 3 VELCRO ST LF (GAUZE/BANDAGES/DRESSINGS) ×4 IMPLANT
BIT DRILL 2.5X110 QC LCP DISP (BIT) ×1 IMPLANT
BIT DRILL 2.8 (BIT) ×1
BIT DRILL CANN QC 2.8X165 (BIT) IMPLANT
BIT DRILL LCP QC 2X140 (BIT) ×1 IMPLANT
BLADE LONG MED 31X9 (MISCELLANEOUS) ×2 IMPLANT
BLADE SURG ROTATE 9660 (MISCELLANEOUS) IMPLANT
BNDG CMPR 9X4 STRL LF SNTH (GAUZE/BANDAGES/DRESSINGS) ×1
BNDG ESMARK 4X9 LF (GAUZE/BANDAGES/DRESSINGS) ×2 IMPLANT
BNDG GAUZE ELAST 4 BULKY (GAUZE/BANDAGES/DRESSINGS) ×2 IMPLANT
BUR RND FLUTED 2.5 (BURR) ×1 IMPLANT
CORDS BIPOLAR (ELECTRODE) ×2 IMPLANT
COVER SURGICAL LIGHT HANDLE (MISCELLANEOUS) ×2 IMPLANT
CUFF TOURNIQUET SINGLE 18IN (TOURNIQUET CUFF) ×2 IMPLANT
CUFF TOURNIQUET SINGLE 24IN (TOURNIQUET CUFF) IMPLANT
DRAIN TLS ROUND 10FR (DRAIN) IMPLANT
DRAPE OEC MINIVIEW 54X84 (DRAPES) ×2 IMPLANT
DRAPE SURG 17X11 SM STRL (DRAPES) ×2 IMPLANT
DRAPE SURG 17X23 STRL (DRAPES) ×2 IMPLANT
DRILL BIT 2.8MM (BIT) ×2
DRSG ADAPTIC 3X8 NADH LF (GAUZE/BANDAGES/DRESSINGS) ×2 IMPLANT
ELECT REM PT RETURN 9FT ADLT (ELECTROSURGICAL)
ELECTRODE REM PT RTRN 9FT ADLT (ELECTROSURGICAL) IMPLANT
EVACUATOR 1/8 PVC DRAIN (DRAIN) IMPLANT
GAUZE SPONGE 4X4 12PLY STRL (GAUZE/BANDAGES/DRESSINGS) ×2 IMPLANT
GAUZE SPONGE 4X4 16PLY XRAY LF (GAUZE/BANDAGES/DRESSINGS) ×2 IMPLANT
GAUZE XEROFORM 1X8 LF (GAUZE/BANDAGES/DRESSINGS) ×2 IMPLANT
GLOVE BIOGEL M 7.0 STRL (GLOVE) ×1 IMPLANT
GLOVE BIOGEL PI IND STRL 7.5 (GLOVE) IMPLANT
GLOVE BIOGEL PI IND STRL 8.5 (GLOVE) ×1 IMPLANT
GLOVE BIOGEL PI INDICATOR 7.5 (GLOVE) ×1
GLOVE BIOGEL PI INDICATOR 8.5 (GLOVE) ×1
GLOVE SURG ORTHO 8.0 STRL STRW (GLOVE) ×2 IMPLANT
GOWN STRL REUS W/ TWL LRG LVL3 (GOWN DISPOSABLE) ×3 IMPLANT
GOWN STRL REUS W/ TWL XL LVL3 (GOWN DISPOSABLE) ×1 IMPLANT
GOWN STRL REUS W/TWL LRG LVL3 (GOWN DISPOSABLE) ×6
GOWN STRL REUS W/TWL XL LVL3 (GOWN DISPOSABLE) ×4
KIT BASIN OR (CUSTOM PROCEDURE TRAY) ×2 IMPLANT
KIT ROOM TURNOVER OR (KITS) ×2 IMPLANT
LOOP VESSEL MAXI BLUE (MISCELLANEOUS) IMPLANT
MANIFOLD NEPTUNE II (INSTRUMENTS) ×2 IMPLANT
NDL HYPO 25GX1X1/2 BEV (NEEDLE) IMPLANT
NDL HYPO 25X1 1.5 SAFETY (NEEDLE) ×1 IMPLANT
NEEDLE HYPO 25GX1X1/2 BEV (NEEDLE) IMPLANT
NEEDLE HYPO 25X1 1.5 SAFETY (NEEDLE) ×2 IMPLANT
NS IRRIG 1000ML POUR BTL (IV SOLUTION) ×2 IMPLANT
PACK ORTHO EXTREMITY (CUSTOM PROCEDURE TRAY) ×2 IMPLANT
PAD ARMBOARD 7.5X6 YLW CONV (MISCELLANEOUS) ×4 IMPLANT
PAD CAST 4YDX4 CTTN HI CHSV (CAST SUPPLIES) ×2 IMPLANT
PADDING CAST COTTON 4X4 STRL (CAST SUPPLIES) ×2
PLATE STD LCP WRIST FUSION (Plate) ×1 IMPLANT
PUTTY DBM STAGRAFT PLUS 5CC (Putty) ×1 IMPLANT
SCREW BN 2.5XFT 20X2.7X5X CORT (Screw) IMPLANT
SCREW CORT ST 2.7X20 (Screw) ×2 IMPLANT
SCREW CORTEX 2.7X14MM (Screw) ×1 IMPLANT
SCREW CORTEX 3.5 20MM (Screw) ×1 IMPLANT
SCREW CORTEX 3.5 22MM (Screw) ×1 IMPLANT
SCREW LOCK CORT ST 3.5X20 (Screw) IMPLANT
SCREW LOCK CORT ST 3.5X22 (Screw) IMPLANT
SCREW LOCK ST 2.7X20 (Screw) ×1 IMPLANT
SCREW LOCK T15 FT 20X3.5XST (Screw) IMPLANT
SCREW LOCKING 3.5X20 (Screw) ×2 IMPLANT
SCREW LOCKING 3.5X26 (Screw) ×1 IMPLANT
SLING ARM IMMOBILIZER LRG (SOFTGOODS) ×1 IMPLANT
SOAP 2 % CHG 4 OZ (WOUND CARE) ×2 IMPLANT
STRIP CLOSURE SKIN 1/2X4 (GAUZE/BANDAGES/DRESSINGS) IMPLANT
SUCTION FRAZIER HANDLE 10FR (MISCELLANEOUS)
SUCTION TUBE FRAZIER 10FR DISP (MISCELLANEOUS) IMPLANT
SUT ETHILON 4 0 PS 2 18 (SUTURE) IMPLANT
SUT FIBERWIRE 3-0 18 TAPR NDL (SUTURE) ×2
SUT MNCRL AB 4-0 PS2 18 (SUTURE) IMPLANT
SUT PROLENE 4 0 P 3 18 (SUTURE) ×3 IMPLANT
SUT PROLENE 4 0 PS 2 18 (SUTURE) ×3 IMPLANT
SUT VIC AB 2-0 CT1 27 (SUTURE)
SUT VIC AB 2-0 CT1 TAPERPNT 27 (SUTURE) IMPLANT
SUT VIC AB 2-0 FS1 27 (SUTURE) ×3 IMPLANT
SUT VIC AB 3-0 FS2 27 (SUTURE) ×1 IMPLANT
SUT VICRYL 4-0 PS2 18IN ABS (SUTURE) ×1 IMPLANT
SUTURE FIBERWR 3-0 18 TAPR NDL (SUTURE) IMPLANT
SYR CONTROL 10ML LL (SYRINGE) IMPLANT
SYSTEM CHEST DRAIN TLS 7FR (DRAIN) IMPLANT
TOWEL OR 17X24 6PK STRL BLUE (TOWEL DISPOSABLE) ×2 IMPLANT
TOWEL OR 17X26 10 PK STRL BLUE (TOWEL DISPOSABLE) ×2 IMPLANT
TUBE CONNECTING 12X1/4 (SUCTIONS) ×2 IMPLANT
UNDERPAD 30X30 (UNDERPADS AND DIAPERS) ×2 IMPLANT
WATER STERILE IRR 1000ML POUR (IV SOLUTION) ×2 IMPLANT
YANKAUER SUCT BULB TIP NO VENT (SUCTIONS) IMPLANT

## 2015-11-06 NOTE — Discharge Instructions (Signed)
KEEP BANDAGE CLEAN AND DRY °CALL OFFICE FOR F/U APPT [phone] in 14 days °KEEP HAND ELEVATED ABOVE HEART °OK TO APPLY ICE TO OPERATIVE AREA °CONTACT OFFICE IF ANY WORSENING PAIN OR CONCERNS. °

## 2015-11-06 NOTE — Anesthesia Postprocedure Evaluation (Signed)
Anesthesia Post Note  Patient: [name]  Procedure(s) Performed: Procedure(s) (LRB): LEFT WRIST CARPAL TUNNEL RELEASE (Left) ARTHRODESIS LEFT WRIST (Left)  Patient location during evaluation: PACU Anesthesia Type: General and Regional Level of consciousness: awake, awake and alert and oriented Pain management: pain level controlled Vital Signs Assessment: post-procedure vital signs reviewed and stable Respiratory status: spontaneous breathing, nonlabored ventilation and respiratory function stable Cardiovascular status: blood pressure returned to baseline Anesthetic complications: no    Last Vitals:  Vitals:   11/06/15 1715 11/06/15 1733  BP: 100/73 118/78  Pulse: 98 100  Resp: (!) 21 20  Temp: 36.4 C 36.7 C    Last Pain:  Vitals:   11/06/15 1900  TempSrc:   PainSc: 9                  Lucy Boardman COKER

## 2015-11-06 NOTE — Anesthesia Procedure Notes (Signed)
Anesthesia Regional Block:  Supraclavicular block  Pre-Anesthetic Checklist: ,, timeout performed, Correct Patient, Correct Site, Correct Laterality, Correct Procedure, Correct Position, site marked, Risks and benefits discussed,  Surgical consent,  Pre-op evaluation,  At surgeon's request and post-op pain management  Laterality: Left  Prep: chloraprep       Needles:  Injection technique: Single-shot  Needle Type: Stimulator Needle - 80          Additional Needles: Supraclavicular block Narrative:  Start time: 11/06/2015 3:10 PM End time: 11/06/2015 3:15 PM Injection made incrementally with aspirations every 5 mL.  Performed by: Personally   Additional Notes: 30 cc 0.5% bupivacaine with 1:200 epi injected easily

## 2015-11-06 NOTE — Anesthesia Preprocedure Evaluation (Signed)
Anesthesia Evaluation  Patient identified by MRN, date of birth, ID band Patient awake    Reviewed: Allergy & Precautions, NPO status , Patient's Chart, lab work & pertinent test results  Airway Mallampati: II  TM Distance: >3 FB Neck ROM: Limited    Dental  (+) Teeth Intact, Dental Advisory Given   Pulmonary Current Smoker,    breath sounds clear to auscultation       Cardiovascular hypertension,  Rhythm:Regular Rate:Normal     Neuro/Psych    GI/Hepatic   Endo/Other    Renal/GU      Musculoskeletal   Abdominal   Peds  Hematology   Anesthesia Other Findings   Reproductive/Obstetrics                             Anesthesia Physical Anesthesia Plan  ASA: III  Anesthesia Plan: General   Post-op Pain Management:  Regional for Post-op pain   Induction: Intravenous  Airway Management Planned: LMA  Additional Equipment:   Intra-op Plan:   Post-operative Plan:   Informed Consent: I have reviewed the patients History and Physical, chart, labs and discussed the procedure including the risks, benefits and alternatives for the proposed anesthesia with the patient or authorized representative who has indicated his/her understanding and acceptance.   Dental advisory given  Plan Discussed with: CRNA and Anesthesiologist  Anesthesia Plan Comments:         Anesthesia Quick Evaluation

## 2015-11-06 NOTE — Transfer of Care (Signed)
Immediate Anesthesia Transfer of Care Note  Patient: [name]  Procedure(s) Performed: Procedure(s): LEFT WRIST CARPAL TUNNEL RELEASE (Left) ARTHRODESIS LEFT WRIST (Left)  Patient Location: PACU  Anesthesia Type:General  Level of Consciousness: awake, alert  and oriented  Airway & Oxygen Therapy: Patient Spontanous Breathing and Patient connected to nasal cannula oxygen  Post-op Assessment: Report given to RN and Post -op Vital signs reviewed and stable  Post vital signs: Reviewed and stable  Last Vitals:  Vitals:   11/06/15 1155 11/06/15 1200  BP: (!) 146/84 136/68  Pulse: 79 69  Resp: (!) 25 14  Temp:      Last Pain:  Vitals:   11/06/15 1059  TempSrc:   PainSc: 7       Patients Stated Pain Goal: 4 (11/06/15 1059)  Complications: No apparent anesthesia complications

## 2015-11-06 NOTE — OR Nursing (Addendum)
Dr. Melvyn Novas notified of 1 hour tourniquet time. Requested 30 more minutes.  After the 30 minutes passed he requested 30 more.

## 2015-11-06 NOTE — H&P (Signed)
[name] is an 45 y.o. male.   Chief Complaint: Left wrist pain HPI: Pt followed in office Pt with pancarpal arthritis of left wrist Pt with chronic left wrist pain that has not responded to nonsurgical treatment Pt here for surgery on left wrist Pt also with numbness and tingling c/w left hand carpal tunnel syndrome  Past Medical History:  Diagnosis Date  . Anxiety   . Arthritis   . Asthma    history [id number]  . Depression   . Essential hypertension 12/11/2011  . Gout   . Headache   . History of hiatal hernia   . History of lithotripsy   . Hypertension   . Kidney stones   . Lumbar degenerative disc disease 12/11/2011  . Nephrolithiasis 12/11/2011  . Pericarditis   . Renal tubular acidosis   . Rheumatoid arthritis(714.0)     Past Surgical History:  Procedure Laterality Date  . ANTERIOR CERVICAL DECOMP/DISCECTOMY FUSION    . HEMORROIDECTOMY    . KIDNEY STONE SURGERY      Family History  Problem Relation Age of Onset  . Hypertension Father   . Rheum arthritis Father   . Alcohol abuse Father   . Heart attack Father   . Diabetes Mellitus II Father   . Coronary artery disease Father   . Diabetes Mellitus II Mother   . Cancer - Colon Mother   . Coronary artery disease Mother   . Heart attack Mother   . Ovarian cancer Mother   . Uterine cancer Mother   . Dementia Neg Hx   . Bipolar disorder Neg Hx   . Depression Neg Hx   . Drug abuse Neg Hx   . OCD Neg Hx    Social History:  reports that he has been smoking.  He has a 14.50 pack-year smoking history. He has never used smokeless tobacco. He reports that he does not drink alcohol or use drugs.  Allergies:  Allergies  Allergen Reactions  . Benadryl [Diphenhydramine Hcl] Other (See Comments)    Causes severe anxiety  . Benzamide Derivatives Other (See Comments)    blisters  . Benzocaine Hives and Other (See Comments)    blisters  . Lisinopril Cough    No prescriptions prior to admission.    No  results found for this or any previous visit (from the past 48 hour(s)). No results found.  ROS: NO RECENT ILLNESSES OR HOSPITALIZATIONS  There were no vitals taken for this visit. Physical Exam  General Appearance:  Alert, cooperative, no distress, appears stated age  Head:  Normocephalic, without obvious abnormality, atraumatic  Eyes:  Pupils equal, conjunctiva/corneas clear,         Throat: Lips, mucosa, and tongue normal; teeth and gums normal  Neck: No visible masses     Lungs:   respirations unlabored  Chest Wall:  No tenderness or deformity  Heart:  Regular rate and rhythm,  Abdomen:   Soft, non-tender,         Extremities: LEFT WRIST: SKIN INTACT, FINGERS WARM WELL PERFUSED LIMITED WRIST MOBILITY IN ALL PLANES GOOD FOREARM ROTATION +carpal tunnel compression test No thenar muscle atrophy  Pulses: 2+ and symmetric  Skin: Skin color, texture, turgor normal, no rashes or lesions     Neurologic: Normal    Assessment/Plan LEFT WRIST PANCARPAL ARTHRITIS AND CHRONIC LEFT WRIST PAIN LEFT HAND CARPAL TUNNEL SYNDROME  LEFT WRIST ARTHRODESIS WITH LOCAL GRAFT/ALLOGRAFT LEFT HAND CARPAL TUNNEL RELEASE  R/B/A DISCUSSED WITH PT IN OFFICE.  PT VOICED UNDERSTANDING OF PLAN CONSENT SIGNED DAY OF SURGERY PT SEEN AND EXAMINED PRIOR TO OPERATIVE PROCEDURE/DAY OF SURGERY SITE MARKED. QUESTIONS ANSWERED WILL REMAIN OVERNIGHT FOLLOWING SURGERY  WE ARE PLANNING SURGERY FOR YOUR UPPER EXTREMITY. THE RISKS AND BENEFITS OF SURGERY INCLUDE BUT NOT LIMITED TO BLEEDING INFECTION, DAMAGE TO NEARBY NERVES ARTERIES TENDONS, FAILURE OF SURGERY TO ACCOMPLISH ITS INTENDED GOALS, PERSISTENT SYMPTOMS AND NEED FOR FURTHER SURGICAL INTERVENTION. WITH THIS IN MIND WE WILL PROCEED. I HAVE DISCUSSED WITH THE PATIENT THE PRE AND POSTOPERATIVE REGIMEN AND THE DOS AND DON'TS. PT VOICED UNDERSTANDING AND INFORMED CONSENT SIGNED. Sharma Covert 11/06/2015, 7:24 AM

## 2015-11-06 NOTE — Anesthesia Procedure Notes (Signed)
Procedure Name: LMA Insertion Date/Time: 11/06/2015 1:37 PM Performed by: Little Ishikawa L Pre-anesthesia Checklist: Patient identified, Emergency Drugs available, Suction available and Patient being monitored Patient Re-evaluated:Patient Re-evaluated prior to inductionOxygen Delivery Method: Circle System Utilized Preoxygenation: Pre-oxygenation with 100% oxygen Intubation Type: IV induction Ventilation: Mask ventilation without difficulty LMA: LMA inserted LMA Size: 5.0 Number of attempts: 1 Airway Equipment and Method: Bite block Placement Confirmation: positive ETCO2 and breath sounds checked- equal and bilateral Tube secured with: Tape Dental Injury: Teeth and Oropharynx as per pre-operative assessment

## 2015-11-07 ENCOUNTER — Encounter (HOSPITAL_COMMUNITY): Payer: Self-pay

## 2015-11-07 DIAGNOSIS — M19032 Primary osteoarthritis, left wrist: Secondary | ICD-10-CM | POA: Diagnosis not present

## 2015-11-07 DIAGNOSIS — M109 Gout, unspecified: Secondary | ICD-10-CM | POA: Diagnosis not present

## 2015-11-07 DIAGNOSIS — G5602 Carpal tunnel syndrome, left upper limb: Secondary | ICD-10-CM | POA: Diagnosis not present

## 2015-11-07 DIAGNOSIS — F419 Anxiety disorder, unspecified: Secondary | ICD-10-CM | POA: Diagnosis not present

## 2015-11-07 DIAGNOSIS — F329 Major depressive disorder, single episode, unspecified: Secondary | ICD-10-CM | POA: Diagnosis not present

## 2015-11-07 DIAGNOSIS — I10 Essential (primary) hypertension: Secondary | ICD-10-CM | POA: Diagnosis not present

## 2015-11-07 NOTE — Progress Notes (Signed)
PT Cancellation and Discharge Note  Patient Details Name: [name] MRN: [id number] DOB: 03/13/1970   Cancelled Treatment:    Reason Eval/Treat Not Completed: PT screened, no needs identified, will sign off.  Spoke with Nsg tech and OT, who both indicate pt is mobilizing and does not have any PT needs at this time.  Will sign off and need new order should needs arise.     Sunny Schlein, [place] [phone] 11/07/2015, 10:22 AM

## 2015-11-07 NOTE — Discharge Summary (Signed)
Physician Discharge Summary  Patient ID: [name] MRN: [id number] DOB/AGE: 1970-11-25 45 y.o.  Admit date: 11/06/2015 Discharge date: 11/07/2015  Admission Diagnoses: left wrist advanced RA and left wrist carpal tunnel Past Medical History:  Diagnosis Date  . Anxiety   . Arthritis   . Asthma    history [id number]  . Depression   . Essential hypertension 12/11/2011  . Gout   . Headache   . History of hiatal hernia   . History of lithotripsy   . Hypertension   . Kidney stones   . Lumbar degenerative disc disease 12/11/2011  . Nephrolithiasis 12/11/2011  . Pericarditis   . Renal tubular acidosis   . Rheumatoid arthritis(714.0)     Discharge Diagnoses:  Active Problems:   Arthritis of left wrist   Surgeries: Procedure(s): LEFT WRIST CARPAL TUNNEL RELEASE ARTHRODESIS LEFT WRIST on 11/06/2015    Consultants: none  Discharged Condition: Improved  Hospital Course: [name] is an 45 y.o. male who was admitted 11/06/2015 with a chief complaint of No chief complaint on file. , and found to have a diagnosis of left wrist advanced RA and left wrist carpal tunnel.  They were brought to the operating room on 11/06/2015 and underwent Procedure(s): LEFT WRIST CARPAL TUNNEL RELEASE ARTHRODESIS LEFT WRIST.    They were given perioperative antibiotics: Anti-infectives    Start     Dose/Rate Route Frequency Ordered Stop   11/07/15 0200  ceFAZolin (ANCEF) IVPB 1 g/50 mL premix     1 g 100 mL/hr over 30 Minutes Intravenous Every 8 hours 11/06/15 1730     11/06/15 1745  ceFAZolin (ANCEF) IVPB 1 g/50 mL premix     1 g 100 mL/hr over 30 Minutes Intravenous NOW 11/06/15 1730 11/06/15 1815   11/06/15 1100  ceFAZolin (ANCEF) IVPB 2g/100 mL premix     2 g 200 mL/hr over 30 Minutes Intravenous To ShortStay Surgical 11/06/15 1048 11/06/15 1340    .  They were given sequential compression devices, early ambulation, and Other (comment) for DVT prophylaxis.  Recent vital  signs: Patient Vitals for the past 24 hrs:  BP Temp Temp src Pulse Resp SpO2 Weight  11/07/15 0415 133/85 98.1 F (36.7 C) Oral 99 - 96 % -  11/06/15 2350 132/74 97.5 F (36.4 C) Oral 87 18 97 % -  11/06/15 2019 126/73 97.8 F (36.6 C) Oral 98 18 96 % -  11/06/15 1733 118/78 98.1 F (36.7 C) Oral 100 20 94 % -  11/06/15 1715 100/73 97.5 F (36.4 C) - 98 (!) 21 92 % -  11/06/15 1700 95/63 - - (!) 102 17 95 % -  11/06/15 1645 116/75 - - 94 17 95 % -  11/06/15 1635 122/84 97.9 F (36.6 C) - (!) 102 13 92 % -  11/06/15 1200 136/68 - - 69 14 93 % -  .  Recent laboratory studies: No results found.  Discharge Medications:    Diagnostic Studies: No results found.  They benefited maximally from their hospital stay and there were no complications.     Disposition: 01-Home or Self Care  Follow-up Information    Sharma Covert, MD In 2 weeks.   Specialty:  Orthopedic Surgery Contact information: [address] [place] [address] [phone]            Signed: Sharma Covert 11/07/2015, 11:55 AM

## 2015-11-07 NOTE — Evaluation (Signed)
Occupational Therapy Evaluation/Discharge Patient Details Name: [name] MRN: [id number] DOB: December 14, 1970 Today's Date: 11/07/2015    History of Present Illness Pt is a 45 y.o male s/p L wrist carpal tunnel release and arthrodesis lef wrist. Pt has a past medical history significant for but not limited to anxiety, rheumatoid arthritis, hypertension, lumbar degenerative disc disease, and pericarditis and a surgical history of anterior cervical decompression/disectomy fusion.    Clinical Impression   PTA, pt was independent with all ADL and independent with assistive devices for functional mobility. Pt reports utilizing cane at times when pain in knees from rheumatoid arthritis flares up. Pt currently requires intermittent supervision for all ADL and functional mobility. Pt does require min assist from son to don sling. Educated pt on compensatory techniques for dressing, safe tub transfer, use of ice and elevation for edema management, and hook and tabletop tendon glides (unable to complete full tendon glides due to cast immobilizing wrist to distal palmar crease. Pt verbalizes and demonstrates understanding. Recommend D/C home with son who can provide intermittent supervision. Pt has no further acute OT needs. OT will sign off.    Follow Up Recommendations  No OT follow up;Supervision - Intermittent;Other (comment) (Follow-up per MD)    Equipment Recommendations  None recommended by OT       Precautions / Restrictions Precautions Precautions: None Restrictions Weight Bearing Restrictions: No      Mobility Bed Mobility Overal bed mobility: Needs Assistance Bed Mobility: Supine to Sit     Supine to sit: Supervision     General bed mobility comments: VC's for technique.  Transfers Overall transfer level: Needs assistance Equipment used: None Transfers: Sit to/from Stand Sit to Stand: Supervision              Balance Overall balance assessment: No apparent balance  deficits (not formally assessed)                                          ADL Overall ADL's : Needs assistance/impaired Eating/Feeding: Set up;Sitting   Grooming: Standing;Supervision/safety   Upper Body Bathing: Supervision/ safety;Sitting   Lower Body Bathing: Supervison/ safety;Sit to/from stand   Upper Body Dressing : Supervision/safety;Sitting Upper Body Dressing Details (indicate cue type and reason): Pt requires supervision for use of compensatory techniques with dressing UB and min assist for donning/soffing sling. Lower Body Dressing: Supervision/safety;Sit to/from stand   Toilet Transfer: Ambulation;Regular Toilet;Supervision/safety   Toileting- Architect and Hygiene: Modified independent;Sit to/from stand   Tub/ Shower Transfer: Supervision/safety;Ambulation   Functional mobility during ADLs: Supervision/safety General ADL Comments: Pt educated on compensatory dressing techniques and benefits of sitting for showering. Pt also educated on hook and tabletop tendon glides to complete each hour and ice and elevation for edema management. Full tendon glides not possible at this time due to casting.     Vision Vision Assessment?: No apparent visual deficits          Pertinent Vitals/Pain Pain Assessment: 0-10 Pain Score: 7  Pain Location: L wrist Pain Descriptors / Indicators: Operative site guarding;Numbness;Sore Pain Intervention(s): Ice applied;Monitored during session;Repositioned     Hand Dominance Left   Extremity/Trunk Assessment Upper Extremity Assessment Upper Extremity Assessment: LUE deficits/detail LUE Deficits / Details: Decreased strength and ROM post-operatively. LUE: Unable to fully assess due to immobilization (Wrist immobilized post-operatively.)   Lower Extremity Assessment Lower Extremity Assessment: Overall WFL for tasks assessed  Communication Communication Communication: No difficulties   Cognition  Arousal/Alertness: Awake/alert Behavior During Therapy: WFL for tasks assessed/performed Overall Cognitive Status: Within Functional Limits for tasks assessed                        Exercises Exercises: Other exercises Other Exercises Other Exercises: Hook and tabletop position tendon glides for 10 repetitions with L hand.   Shoulder Instructions      Home Living Family/patient expects to be discharged to:: Private residence Living Arrangements: Children Available Help at Discharge: Family Type of Home: Mobile home Home Access: Stairs to enter Entrance Stairs-Number of Steps: 1 Entrance Stairs-Rails: None Home Layout: One level     Bathroom Shower/Tub: Tub/shower unit Shower/tub characteristics: Engineer, building services: Standard Bathroom Accessibility: Yes   Home Equipment: Production assistant, radio - single point          Prior Functioning/Environment Level of Independence: Independent with assistive device(s)        Comments: Used cane at times.        OT Problem List: Decreased strength;Decreased range of motion;Pain   OT Treatment/Interventions:      OT Goals(Current goals can be found in the care plan section) Acute Rehab OT Goals Patient Stated Goal: to go home by 12pm OT Goal Formulation: With patient Time For Goal Achievement: 11/21/15 Potential to Achieve Goals: Good      End of Session Equipment Utilized During Treatment:  (Sling) Nurse Communication: Other (comment) (No further acute OT needs.)  Activity Tolerance: Patient tolerated treatment well Patient left: in bed;with call bell/phone within reach;with family/visitor present   Time: 1610-9604 OT Time Calculation (min): 20 min Charges:  OT General Charges $OT Visit: 1 Procedure OT Evaluation $OT Eval Moderate Complexity: 1 Procedure G-Codes: OT G-codes **NOT FOR INPATIENT CLASS** Functional Assessment Tool Used: Clinical judgement Functional Limitation: Self care Self Care Current  Status (V4098): At least 1 percent but less than 20 percent impaired, limited or restricted Self Care Goal Status (J1914): At least 1 percent but less than 20 percent impaired, limited or restricted Self Care Discharge Status [id number]): At least 1 percent but less than 20 percent impaired, limited or restricted  [facility], OTR/L [phone] 11/07/2015, 10:30 AM

## 2015-11-11 DIAGNOSIS — M5136 Other intervertebral disc degeneration, lumbar region: Secondary | ICD-10-CM | POA: Diagnosis not present

## 2015-11-11 DIAGNOSIS — M0589 Other rheumatoid arthritis with rheumatoid factor of multiple sites: Secondary | ICD-10-CM | POA: Diagnosis not present

## 2015-11-11 DIAGNOSIS — Z79899 Other long term (current) drug therapy: Secondary | ICD-10-CM | POA: Diagnosis not present

## 2015-11-11 DIAGNOSIS — G894 Chronic pain syndrome: Secondary | ICD-10-CM | POA: Diagnosis not present

## 2015-11-11 NOTE — Op Note (Signed)
[name], [name] NO.:  [id number]  MEDICAL RECORD NO.:  [id number]  LOCATION:                                 FACILITY:  PHYSICIAN:  [name] IV, M.D.DATE OF BIRTH:  1970/12/13  DATE OF PROCEDURE:  11/06/2015 DATE OF DISCHARGE:  11/07/2015                              OPERATIVE REPORT   PREOPERATIVE DIAGNOSES: 1. Left wrist pancarpal arthritis, bone-on-bone arthritis. 2. Left hand carpal tunnel syndrome.  POSTOPERATIVE DIAGNOSES: 1. Left wrist pancarpal arthritis, bone-on-bone arthritis. 2. Left hand carpal tunnel syndrome.  ATTENDING PHYSICIAN:  [name], M.D., who scrubbed and present for the entire procedure.  ASSISTANT SURGEON:  None.  ANESTHESIA:  General via LMA with supraclavicular block.  SURGICAL PROCEDURES: 1. Left wrist arthrodesis, pancarpal arthrodesis with local allograft     and autogenous graft from the distal radius. 2. Left wrist extensor pollicis longus tendon transfer to dorsum of     the wrist. 3. Left wrist posterior interosseous nerve neurectomy. 4. Left hand carpal tunnel release. 5. Radiographs 3 views, left wrist.  RADIOGRAPHIC INTERPRETATION:  AP and oblique views of the wrist did show a Synthes wrist fusion plate in good position and good alignment.  SURGICAL IMPLANTS:  DePuy Synthes wrist fusion plate, standard plate.  SURGICAL INDICATIONS:  [name] is a right-hand-dominant gentleman, who had persistent left wrist pain, bone-on-bone wrist arthritis and elected to undergo the above procedures.  Risks, benefits, and alternatives were discussed in detail with the patient and signed informed consent was obtained.  Risks include, but not limited to bleeding, infection, damage to nearby nerves, arteries, or tendons; loss of motion of the wrist and digits, incomplete relief of symptoms, and need for further surgical intervention.  DESCRIPTION OF PROCEDURE:  The patient was properly identified in  the preoperative holding area and marked with a permanent marker made on the left wrist to indicate the correct operative site.  The patient was brought back to the operating room, placed supine on the anesthesia table, where general anesthesia was administered.  The patient tolerated this well.  A well-padded tourniquet was placed on left brachium and sealed with 1000 drape.  Left upper extremity was then prepped and draped in a normal sterile fashion.  Time-out was called, the correct site was identified, and the procedure then begun.  Attention was then turned to the left wrist.  A several centimeter incision was made directly in the midpalm for the carpal tunnel release.  Tourniquet inflated.  Dissection was carried down through the skin and subcutaneous tissue.  The palmar fascia was incised longitudinally to expose the transverse carpal ligament, carried out and under direct visualization, the distal one-half of the transverse carpal ligament released.  Further exposure was then carried out proximally, and a portion of the transverse carpal ligament was then released following a portion of the antebrachial fascia.  The wound was then thoroughly irrigated.  Skin was then closed using horizontal mattress Prolene sutures.  Attention was then turned to the wrist arthrodesis.  A longitudinal dorsal approach to the wrist was then carried out.  Dissection was carried down through the skin and subcutaneous tissue.  The third  dorsal compartment was then opened up, and the tendon was then transferred to the dorsum of the hand.  Going through the fourth dorsal compartment, the posterior interosseous nerve was then identified and segment of the posterior interosseus was then excised and wrist denervation was then carried out. A longitudinal capsulotomy was then carried out, and the joint was then exposed.  The patient had the bone-on-bone capitolunate arthritis as well as the radiolunate  arthritis.  Scaphocapitate and radioscaphoid arthritis, pancarpal arthritis was noted.  Once the exposure was carried out, the joint was then prepared and we then decorticated the joint surfaces including the fusion.  This included the scaphocapitate, capitolunate, radioscaphoid, and radiolunate joints.  The third [facility] joint was also included.  Lister tubercle was then removed as well as the distal aspect of the radius with osteotomes.  I decorticated the dorsal surface of the scapholunate and capitate.  After decortication, a small bur was then used to take this back to good healthy bleeding bone. The dorsal shavings were then used as cancellous bone graft.  Autogenous bone graft was then drawn from the distal radius.  The wound was then thoroughly irrigated.  All loose bone fragments were then removed. Following this, the graft was then packed into the defect.  Following this, a standard plate was then applied.  The distal 2.7 mm cortical screw was then placed with the appropriate drill bit and depth gauge measurement.  3 cortical screws were then placed in the metacarpal.  The position was then confirmed using the mini C-arm.  Once this was carried out, attention was then turned to the shaft, where a proximal 3.5 cortical screw was then used with the appropriate drill bit and depth gauge measurement.  The plate was aligned and the hand properly positioned with fixation in the radius.  This was placed in a nonlocking portion of the hole and loaded in the lower position.  This screw was placed down and sat nicely loading this under compression very nicely. Following this, the 3 more screws were then placed in the proximal segment with a combination of locking and nonlocking screws.  The wound was then irrigated.  Copious wound irrigation.  After fixation both proximally and distally, the allograft was then packed around the area and final radiographs were then obtained.  The EPL was left  radially to be transposed.  The capsule was then closed with 2-0 Vicryl suture.  A good capsular closure was then done over the plate.  Following this, the fourth retinaculum was then closed with 2-0 Vicryl.  The subcutaneous tissues closed with Vicryl and the skin closed with 4-0 Prolene sutures. Adaptic dressing, sterile compressive bandage then applied.  The patient tolerated the procedure well and was placed in a well-padded sugar-tong splint, extubated, and taken to recovery room in good condition.  POSTPROCEDURE PLAN:  The patient will be admitted overnight for IV antibiotics and pain control, discharge in the morning.  Seen back in my office in 2 weeks for wound check, x-rays, application of short-arm cast.  Total cast immobilization for 6 weeks.  X-rays at each visit, and we will continue to transition him in a brace depending on the radiographs.     Madelynn Done, M.D.     FWO/MEDQ  D:  11/06/2015  T:  11/07/2015  Job:  [id number]

## 2015-11-13 ENCOUNTER — Ambulatory Visit (HOSPITAL_COMMUNITY): Payer: Self-pay | Admitting: Psychiatry

## 2015-11-21 DIAGNOSIS — M70032 Crepitant synovitis (acute) (chronic), left wrist: Secondary | ICD-10-CM | POA: Diagnosis not present

## 2015-11-21 DIAGNOSIS — Z4789 Encounter for other orthopedic aftercare: Secondary | ICD-10-CM | POA: Diagnosis not present

## 2015-11-21 DIAGNOSIS — G5602 Carpal tunnel syndrome, left upper limb: Secondary | ICD-10-CM | POA: Diagnosis not present

## 2015-11-22 ENCOUNTER — Ambulatory Visit (INDEPENDENT_AMBULATORY_CARE_PROVIDER_SITE_OTHER): Payer: Medicare Other | Admitting: Psychiatry

## 2015-11-22 DIAGNOSIS — Z79899 Other long term (current) drug therapy: Secondary | ICD-10-CM

## 2015-11-22 DIAGNOSIS — F331 Major depressive disorder, recurrent, moderate: Secondary | ICD-10-CM

## 2015-11-22 DIAGNOSIS — Z8249 Family history of ischemic heart disease and other diseases of the circulatory system: Secondary | ICD-10-CM

## 2015-11-22 DIAGNOSIS — Z8261 Family history of arthritis: Secondary | ICD-10-CM

## 2015-11-22 DIAGNOSIS — F4323 Adjustment disorder with mixed anxiety and depressed mood: Secondary | ICD-10-CM | POA: Diagnosis not present

## 2015-11-22 DIAGNOSIS — F411 Generalized anxiety disorder: Secondary | ICD-10-CM | POA: Diagnosis not present

## 2015-11-22 DIAGNOSIS — F5102 Adjustment insomnia: Secondary | ICD-10-CM

## 2015-11-22 DIAGNOSIS — Z8489 Family history of other specified conditions: Secondary | ICD-10-CM

## 2015-11-22 DIAGNOSIS — Z79891 Long term (current) use of opiate analgesic: Secondary | ICD-10-CM

## 2015-11-22 MED ORDER — ESCITALOPRAM OXALATE 10 MG PO TABS: 10.0000 mg | ORAL_TABLET | Freq: Every day | ORAL | 1 refills | Status: DC

## 2015-11-22 NOTE — Progress Notes (Signed)
Patient ID: [name], male   DOB: 06/08/1970, 45 y.o.   MRN: [id number] [place] [facility] Follow-up Outpatient Visit  [name] [id number] 45 y.o.  11/22/2015  Chief Complaint: follow up for depression and anxiety.     History of Present Illness:     HPI Comments: [name] is a 45 y/o male with a past psychiatric history significant for symptoms of anxiety and depression. The patient is referred for psychiatric services for medication management.    . Location: The patient  lost his mom in December 2015.Marland Kitchen Anniversaries worry him.  Had wrist surgery. On percocet. And cast.  Otherwise mood wise handling things ok on lexapro. Was on prozac before was causing nausea.   He is on pain medications for arthritis. Pain keeps him limited.  On disability.    Sleep remains poor unless he takes trazadone. On prn dose Modifying factors: his fiance is supportive but has recent a stroke. His disablity was approved 2016. Marland Kitchen Severity: Depression: 6/10 (0=Very depressed; 5=Neutral; 10=Very Happy) (worsened) Anxiety- 6/10 (0=no anxiety; 5= moderate/tolerable anxiety; 10= panic attacks)  . Duration: has had symptoms in his 20's . Fluctuated mood  At times.  . Timing: depends upon stress and pain   . Context: Finances, Disability process,. Aggravated by current medical issues. Ruminates about his stressors.  . Modifying factors: Improves with spending time with his children.  . Associated signs and symptoms:    Past Medical History:  Diagnosis Date  . Anxiety   . Arthritis   . Asthma    history DG:LOVFIEPPI  . Depression   . Essential hypertension 12/11/2011  . Gout   . Headache   . History of hiatal hernia   . History of lithotripsy   . Hypertension   . Kidney stones   . Lumbar degenerative disc disease 12/11/2011  . Nephrolithiasis 12/11/2011  . Pericarditis   . Renal tubular acidosis   . Rheumatoid arthritis(714.0)    Family History  Problem  Relation Age of Onset  . Hypertension Father   . Rheum arthritis Father   . Alcohol abuse Father   . Heart attack Father   . Diabetes Mellitus II Father   . Coronary artery disease Father   . Diabetes Mellitus II Mother   . Cancer - Colon Mother   . Coronary artery disease Mother   . Heart attack Mother   . Ovarian cancer Mother   . Uterine cancer Mother   . Dementia Neg Hx   . Bipolar disorder Neg Hx   . Depression Neg Hx   . Drug abuse Neg Hx   . OCD Neg Hx     Outpatient Encounter Prescriptions as of 11/22/2015  Medication Sig  . Adalimumab (HUMIRA PEN [place]) Inject into the skin.  Marland Kitchen AMBULATORY NON FORMULARY MEDICATION Medication Name: Kasandra Knudsen, use as needed.  Dx: Rheumatoid Arthritis  . amLODipine (NORVASC) 10 MG tablet One tablet by mouth every day for blood pressure control.  . chlorthalidone (HYGROTON) 50 MG tablet Take 1 tablet (50 mg total) by mouth daily.  . cyclobenzaprine (FLEXERIL) 10 MG tablet Take 1 tablet (10 mg total) by mouth 3 (three) times daily as needed for muscle spasms.  Marland Kitchen docusate sodium (COLACE) 100 MG capsule Take 1 capsule (100 mg total) by mouth 2 (two) times daily.  Marland Kitchen eletriptan (RELPAX) 40 MG tablet Take 1 tablet (40 mg total) by mouth as needed for migraine or headache. May repeat in 2 hours if headache persists or recurs.  Marland Kitchen  escitalopram (LEXAPRO) 10 MG tablet Take 1 tablet (10 mg total) by mouth daily.  . methocarbamol (ROBAXIN) 500 MG tablet Take 1 tablet (500 mg total) by mouth 3 (three) times daily.  . methotrexate (RHEUMATREX) 2.5 MG tablet Take 20 mg by mouth once a week. Caution:Chemotherapy. Protect from light.  . naratriptan (AMERGE) 2.5 MG tablet Take 1 tablet (2.5 mg total) by mouth as needed for migraine. Take one (1) tablet at onset of headache; if returns or does not resolve, may repeat after 4 hours; do not exceed five (5) mg in 24 hours.  Marland Kitchen oxyCODONE-acetaminophen (PERCOCET) 10-325 MG tablet Take 1 tablet by mouth every 6 (six) hours as  needed for pain.  Marland Kitchen oxyCODONE-acetaminophen (PERCOCET) 10-325 MG tablet Take 1 tablet by mouth every 4 (four) hours as needed for pain.  . pantoprazole (PROTONIX) 40 MG tablet Take 1 tablet (40 mg total) by mouth daily.  . pregabalin (LYRICA) 50 MG capsule Take 1 capsule (50 mg total) by mouth daily.  . ranitidine (ZANTAC) 150 MG tablet One by mouth twice a day to prevent gastric reflux symptoms. (Patient not taking: Reported on 10/29/2015)  . traZODone (DESYREL) 50 MG tablet Take 1 tablet (50 mg total) by mouth at bedtime as needed for sleep. Take one-half to two tablets at bedtime.  . triamcinolone cream (KENALOG) 0.1 % Apply to affected areas twice a day for up to two weeks, avoid face.  . vitamin C (ASCORBIC ACID) 500 MG tablet Take 1 tablet (500 mg total) by mouth daily.  . [DISCONTINUED] escitalopram (LEXAPRO) 10 MG tablet Take 1 tablet (10 mg total) by mouth daily. Take half tablet a day for first 5 days then one  aday. (Patient taking differently: Take 10 mg by mouth daily. )   No facility-administered encounter medications on file as of 11/22/2015.     Recent Results (from the past 2160 hour(s))  Basic metabolic panel     Status: None   Collection Time: 10/31/15 10:03 AM  Result Value Ref Range   Sodium 139 135 - 145 mmol/L   Potassium 4.3 3.5 - 5.1 mmol/L   Chloride 108 101 - 111 mmol/L   CO2 23 22 - 32 mmol/L   Glucose, Bld 94 65 - 99 mg/dL   BUN 8 6 - 20 mg/dL   Creatinine, Ser 0.92 0.61 - 1.24 mg/dL   Calcium 9.4 8.9 - 10.3 mg/dL   GFR calc non Af Amer >60 >60 mL/min   GFR calc Af Amer >60 >60 mL/min    Comment: (NOTE) The eGFR has been calculated using the CKD EPI equation. This calculation has not been validated in all clinical situations. eGFR's persistently <60 mL/min signify possible Chronic Kidney Disease.    Anion gap 8 5 - 15  CBC     Status: None   Collection Time: 10/31/15 10:03 AM  Result Value Ref Range   WBC 7.8 4.0 - 10.5 K/uL   RBC 5.10 4.22 - 5.81  MIL/uL   Hemoglobin 15.2 13.0 - 17.0 g/dL   HCT 45.0 39.0 - 52.0 %   MCV 88.2 78.0 - 100.0 fL   MCH 29.8 26.0 - 34.0 pg   MCHC 33.8 30.0 - 36.0 g/dL   RDW 13.6 11.5 - 15.5 %   Platelets 322 150 - 400 K/uL    There were no vitals taken for this visit.   Review of Systems  Constitutional: Negative for fever.  Cardiovascular: Negative for chest pain.  Musculoskeletal: Positive for joint  pain.  Skin: Negative for rash.  Neurological: Negative for headaches.  Psychiatric/Behavioral: Negative for suicidal ideas.    Mental Status Examination  Appearance: casual and cooperative Alert: Yes Attention: fair  Cooperative: Yes Eye Contact: Good Speech: coherent Psychomotor Activity: Normal Memory/Concentration: adequate Oriented: person, place, time/date and situation Mood: somewhat dysphoric Affect: Constricted Thought Processes and Associations: Coherent Fund of Knowledge: Fair Thought Content: Suicidal ideation and Homicidal ideation were denied. Denies hallucinations Insight: Fair Judgement: Fair  Diagnosis: Major depressive disorder recurrent moderate to severe. Generalized anxiety disorder. Insomnia. 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    Continue trazodone 50 mg when necessary for sleep or insomnia. Has meds for now   Post surgery for wrist . On cast    Medical complexity: Mood also effected by pain. He is currently taking pain meds.    Pertinent Labs and Relevant Prior Notes reviewed. Medication Side effects, benefits and risks reviewed/discussed with Patient. Time given for patient to respond and asks questions regarding the Diagnosis and Medications. Safety concerns and to report to ER if suicidal or call 911.  Follow up with Primary care provider in regards to Medical conditions. Recommend compliance with medications and follow up office appointments. Discussed to  avail opportunity to consider or/and continue Individual therapy with Counselor. Greater than 50% of time was spend in counseling and coordination of care with the patient. Discussed also factors of pain pending surgery and concern with his wife at home was going through recovery from stroke Schedule for Follow up visit in 3 months or eaerlier  or call in earlier as necessary.  Time spent: 25 minutes  AMerian Capron MD

## 2015-12-05 ENCOUNTER — Encounter: Payer: Self-pay | Admitting: Osteopathic Medicine

## 2015-12-05 ENCOUNTER — Ambulatory Visit (INDEPENDENT_AMBULATORY_CARE_PROVIDER_SITE_OTHER): Payer: Medicare Other | Admitting: Osteopathic Medicine

## 2015-12-05 VITALS — BP 151/97 | HR 75 | Ht 67.0 in | Wt 236.0 lb

## 2015-12-05 DIAGNOSIS — Z0000 Encounter for general adult medical examination without abnormal findings: Secondary | ICD-10-CM

## 2015-12-05 DIAGNOSIS — G8929 Other chronic pain: Secondary | ICD-10-CM

## 2015-12-05 DIAGNOSIS — R0681 Apnea, not elsewhere classified: Secondary | ICD-10-CM

## 2015-12-05 DIAGNOSIS — M069 Rheumatoid arthritis, unspecified: Secondary | ICD-10-CM

## 2015-12-05 DIAGNOSIS — M25532 Pain in left wrist: Secondary | ICD-10-CM

## 2015-12-05 DIAGNOSIS — I10 Essential (primary) hypertension: Secondary | ICD-10-CM

## 2015-12-05 DIAGNOSIS — R030 Elevated blood-pressure reading, without diagnosis of hypertension: Secondary | ICD-10-CM

## 2015-12-05 DIAGNOSIS — M5136 Other intervertebral disc degeneration, lumbar region: Secondary | ICD-10-CM | POA: Diagnosis not present

## 2015-12-05 LAB — CBC WITH DIFFERENTIAL/PLATELET
Basophils Absolute: 0 cells/uL (ref 0–200)
Basophils Relative: 0 %
Eosinophils Absolute: 97 cells/uL (ref 15–500)
Eosinophils Relative: 1 %
HCT: 47.6 % (ref 38.5–50.0)
Hemoglobin: 16.2 g/dL (ref 13.2–17.1)
Lymphocytes Relative: 27 %
Lymphs Abs: 2619 cells/uL (ref 850–3900)
MCH: 30.2 pg (ref 27.0–33.0)
MCHC: 34 g/dL (ref 32.0–36.0)
MCV: 88.8 fL (ref 80.0–100.0)
MPV: 9.5 fL (ref 7.5–12.5)
Monocytes Absolute: 776 cells/uL (ref 200–950)
Monocytes Relative: 8 %
Neutro Abs: 6208 cells/uL (ref 1500–7800)
Neutrophils Relative %: 64 %
Platelets: 363 10*3/uL (ref 140–400)
RBC: 5.36 MIL/uL (ref 4.20–5.80)
RDW: 13.6 % (ref 11.0–15.0)
WBC: 9.7 10*3/uL (ref 3.8–10.8)

## 2015-12-05 LAB — LIPID PANEL
Cholesterol: 195 mg/dL (ref <200)
HDL: 26 mg/dL — ABNORMAL LOW (ref >40)
LDL Cholesterol: 144 mg/dL — ABNORMAL HIGH
Total CHOL/HDL Ratio: 7.5 Ratio — ABNORMAL HIGH (ref <5.0)
Triglycerides: 123 mg/dL (ref <150)
VLDL: 25 mg/dL (ref <30)

## 2015-12-05 LAB — COMPLETE METABOLIC PANEL WITH GFR
ALT: 28 U/L (ref 9–46)
AST: 21 U/L (ref 10–40)
Albumin: 4.3 g/dL (ref 3.6–5.1)
Alkaline Phosphatase: 48 U/L (ref 40–115)
BUN: 12 mg/dL (ref 7–25)
CO2: 23 mmol/L (ref 20–31)
Calcium: 9.3 mg/dL (ref 8.6–10.3)
Chloride: 105 mmol/L (ref 98–110)
Creat: 1.06 mg/dL (ref 0.60–1.35)
GFR, Est African American: >89 mL/min (ref >=60)
GFR, Est Non African American: 84 mL/min (ref >=60)
Glucose, Bld: 98 mg/dL (ref 65–99)
Potassium: 4.4 mmol/L (ref 3.5–5.3)
Sodium: 136 mmol/L (ref 135–146)
Total Bilirubin: 0.5 mg/dL (ref 0.2–1.2)
Total Protein: 7.2 g/dL (ref 6.1–8.1)

## 2015-12-05 LAB — TSH: TSH: 1.48 mIU/L (ref 0.40–4.50)

## 2015-12-05 MED ORDER — OXYCODONE-ACETAMINOPHEN 10-325 MG PO TABS: 1.0000 | ORAL_TABLET | Freq: Four times a day (QID) | ORAL | 0 refills | Status: DC | PRN

## 2015-12-05 NOTE — Progress Notes (Signed)
HPI: [name] is a 45 y.o. male  who presents to [facility] today, 12/05/15,  for chief complaint of:  Chief Complaint  Patient presents with  . Follow-up    pain mgt    Chronic pain: Recently underwent surgery on left wrist. Requests refill of oxycodone, previously had been getting this from Dr. Ivan Anchors, PCP. Pain management referral is in process, patient states that he will not be able to be seen there until December and he is to call on December 1 to set up an appointment. History of chronic osteoarthritis and rheumatoid arthritis. [place] controlled substance database reviewed      Sleep apnea concern: Patient was also recently visiting a friend in the hospital, was sleeping at bedside and the nurse observed him stopping breathing multiple occasions area patient is concerned and would like sleep study done.  Hypertension: Blood pressure mild elevated today, patient states he was in a rush to get here/in pain. Previous blood pressures not concerning  Hospital records reviewed: Discharged 11/07/2015, diagnoses left wrist advance rheumatoid arthritis and carpal tunnel syndrome. Underwent left carpal tunnel release and arthrodesis of the left wrist. No complications.    Past medical, surgical, social and family history reviewed: Patient Active Problem List   Diagnosis Date Noted  . Arthritis of left wrist 11/06/2015  . Left hand pain 08/27/2015  . Rectal bleeding 05/11/2013  . Hyperglycemia 11/02/2012  . Tinnitus 11/02/2012  . Major depressive disorder, single episode, severe, without mention of psychotic behavior 08/31/2012  . Infected sebaceous cyst of skin 04/19/2012  . Hypercalcemia 03/16/2012  . Tobacco use 03/04/2012  . Restless leg 02/12/2012  . Degenerative disc disease, cervical 12/28/2011  . Hyperlipidemia LDL goal <130 12/21/2011  . Rheumatoid arthritis (HCC) 12/17/2011  . Essential hypertension 12/11/2011  . Lumbar degenerative  disc disease 12/11/2011  . Nephrolithiasis 12/11/2011  . Chronic pain of multiple joints 12/11/2011  . Right shoulder pain 12/11/2011  . Left shoulder pain 12/11/2011   Past Surgical History:  Procedure Laterality Date  . ANTERIOR CERVICAL DECOMP/DISCECTOMY FUSION    . CARPAL TUNNEL RELEASE Left 11/06/2015   Procedure: LEFT WRIST CARPAL TUNNEL RELEASE;  Surgeon: Bradly Bienenstock, MD;  Location: [facility] OR;  Service: Orthopedics;  Laterality: Left;  . HEMORROIDECTOMY    . KIDNEY STONE SURGERY    . WRIST FUSION Left 11/06/2015   Procedure: ARTHRODESIS LEFT WRIST;  Surgeon: Bradly Bienenstock, MD;  Location: [facility] OR;  Service: Orthopedics;  Laterality: Left;   Social History  Substance Use Topics  . Smoking status: Current Every Day Smoker    Packs/day: 0.50    Years: 29.00  . Smokeless tobacco: Never Used     Comment: Nicotrol  . Alcohol use No   Family History  Problem Relation Age of Onset  . Hypertension Father   . Rheum arthritis Father   . Alcohol abuse Father   . Heart attack Father   . Diabetes Mellitus II Father   . Coronary artery disease Father   . Diabetes Mellitus II Mother   . Cancer - Colon Mother   . Coronary artery disease Mother   . Heart attack Mother   . Ovarian cancer Mother   . Uterine cancer Mother   . Dementia Neg Hx   . Bipolar disorder Neg Hx   . Depression Neg Hx   . Drug abuse Neg Hx   . OCD Neg Hx      Current medication list and allergy/intolerance information reviewed:  Current Outpatient Prescriptions on File Prior to Visit  Medication Sig Dispense Refill  . Adalimumab (HUMIRA PEN [place]) Inject into the skin.    Marland Kitchen AMBULATORY NON FORMULARY MEDICATION Medication Name: Gilmer Mor, use as needed.  Dx: Rheumatoid Arthritis 1 Units 0  . amLODipine (NORVASC) 10 MG tablet One tablet by mouth every day for blood pressure control. 90 tablet 2  . chlorthalidone (HYGROTON) 50 MG tablet Take 1 tablet (50 mg total) by mouth daily. 90 tablet 1  . cyclobenzaprine (FLEXERIL)  10 MG tablet Take 1 tablet (10 mg total) by mouth 3 (three) times daily as needed for muscle spasms. 90 tablet 5  . docusate sodium (COLACE) 100 MG capsule Take 1 capsule (100 mg total) by mouth 2 (two) times daily. 10 capsule 0  . eletriptan (RELPAX) 40 MG tablet Take 1 tablet (40 mg total) by mouth as needed for migraine or headache. May repeat in 2 hours if headache persists or recurs. 6 tablet 1  . escitalopram (LEXAPRO) 10 MG tablet Take 1 tablet (10 mg total) by mouth daily. 30 tablet 1  . methocarbamol (ROBAXIN) 500 MG tablet Take 1 tablet (500 mg total) by mouth 3 (three) times daily. 30 tablet 0  . methotrexate (RHEUMATREX) 2.5 MG tablet Take 20 mg by mouth once a week. Caution:Chemotherapy. Protect from light.    . naratriptan (AMERGE) 2.5 MG tablet Take 1 tablet (2.5 mg total) by mouth as needed for migraine. Take one (1) tablet at onset of headache; if returns or does not resolve, may repeat after 4 hours; do not exceed five (5) mg in 24 hours. 10 tablet 0  . oxyCODONE-acetaminophen (PERCOCET) 10-325 MG tablet Take 1 tablet by mouth every 6 (six) hours as needed for pain. 10 tablet 0  . oxyCODONE-acetaminophen (PERCOCET) 10-325 MG tablet Take 1 tablet by mouth every 4 (four) hours as needed for pain. 40 tablet 0  . pantoprazole (PROTONIX) 40 MG tablet Take 1 tablet (40 mg total) by mouth daily. 90 tablet 1  . pregabalin (LYRICA) 50 MG capsule Take 1 capsule (50 mg total) by mouth daily. 90 capsule 2  . traZODone (DESYREL) 50 MG tablet Take 1 tablet (50 mg total) by mouth at bedtime as needed for sleep. Take one-half to two tablets at bedtime. 90 tablet 0  . triamcinolone cream (KENALOG) 0.1 % Apply to affected areas twice a day for up to two weeks, avoid face. 80 g 0  . vitamin C (ASCORBIC ACID) 500 MG tablet Take 1 tablet (500 mg total) by mouth daily. 50 tablet 0  . ranitidine (ZANTAC) 150 MG tablet One by mouth twice a day to prevent gastric reflux symptoms. (Patient not taking: Reported  on 10/29/2015) 180 tablet 2  . [DISCONTINUED] FLUoxetine (PROZAC) 40 MG capsule Take 1 capsule (40 mg total) by mouth 2 (two) times daily. 180 capsule 0   No current facility-administered medications on file prior to visit.    Allergies  Allergen Reactions  . Benadryl [Diphenhydramine Hcl] Other (See Comments)    Causes severe anxiety  . Benzamide Derivatives Other (See Comments)    blisters  . Benzocaine Hives and Other (See Comments)    blisters  . Lisinopril Cough      Review of Systems:  Constitutional: No recent illness  Cardiac: No  chest pain  Respiratory:  No  shortness of breath. No  Cough  Musculoskeletal: +chronic myalgia/arthralgia  Skin: No  Rash  Exam:  BP (!) 151/97   Pulse 75  Ht 5\' 7"  (1.702 m)   Wt 236 lb (107 kg)   BMI 36.96 kg/m   Constitutional: VS see above. General Appearance: alert, well-developed, well-nourished, NAD  Eyes: Normal lids and conjunctive, non-icteric sclera  Ears, Nose, Mouth, Throat: MMM, Normal external inspection ears/nares/mouth/lips/gums.  Neck: No masses, trachea midline.   Respiratory: Normal respiratory effort. no wheeze, no rhonchi, no rales  Cardiovascular: S1/S2 normal, no murmur, no rub/gallop auscultated. RRR.   Musculoskeletal: Gait normal. Symmetric and independent movement of all extremities. Cast on left wrist  Neurological: Normal balance/coordination. No tremor.  Skin: warm, dry, intact.   Psychiatric: Normal judgment/insight. Normal mood and affect. Orien ted x3.      ASSESSMENT/PLAN: Maintain current dose but reduce frequency of opiate pain medication. Patient to follow-up with pain management, referral is in process, patient to call on December 1 to set up an appointment, per his report/instructions from that clinic.  Lumbar degenerative disc disease - Plan: oxyCODONE-acetaminophen (PERCOCET) 10-325 MG tablet  Rheumatoid arthritis of hand, unspecified laterality, unspecified rheumatoid factor  presence (HCC) - Plan: oxyCODONE-acetaminophen (PERCOCET) 10-325 MG tablet  Chronic pain of left wrist - Plan: oxyCODONE-acetaminophen (PERCOCET) 10-325 MG tablet  Elevated blood pressure reading  Essential hypertension - Plan: CBC with Differential/Platelet, COMPLETE METABOLIC PANEL WITH GFR, Lipid panel, TSH, Ambulatory referral to Sleep Studies  Witnessed episode of apnea - Plan: Ambulatory referral to Sleep Studies  Annual physical exam - Plan: CBC with Differential/Platelet, COMPLETE METABOLIC PANEL WITH GFR, Lipid panel, TSH   Please note: Annual physical labs were ordered today but did not bill for annual  Visit summary with medication list and pertinent instructions was printed for patient to review. All questions at time of visit were answered - patient instructed to contact office with any additional concerns. ER/RTC precautions were reviewed with the patient. Follow-up plan: Return in about 3 months (around 03/06/2016) for [facility], sooner if needed for pain Rx refill prior pain mgt. appt .

## 2015-12-10 DIAGNOSIS — G894 Chronic pain syndrome: Secondary | ICD-10-CM | POA: Diagnosis not present

## 2015-12-10 DIAGNOSIS — Z79899 Other long term (current) drug therapy: Secondary | ICD-10-CM | POA: Diagnosis not present

## 2015-12-10 DIAGNOSIS — M5136 Other intervertebral disc degeneration, lumbar region: Secondary | ICD-10-CM | POA: Diagnosis not present

## 2015-12-10 DIAGNOSIS — M0589 Other rheumatoid arthritis with rheumatoid factor of multiple sites: Secondary | ICD-10-CM | POA: Diagnosis not present

## 2015-12-12 ENCOUNTER — Telehealth: Payer: Self-pay | Admitting: Osteopathic Medicine

## 2015-12-12 NOTE — Telephone Encounter (Signed)
I received a fax and patient is scheduled on 01/30/16 with Dr. Manon Hilding at [facility]

## 2015-12-13 DIAGNOSIS — G5602 Carpal tunnel syndrome, left upper limb: Secondary | ICD-10-CM | POA: Diagnosis not present

## 2015-12-13 DIAGNOSIS — Z4789 Encounter for other orthopedic aftercare: Secondary | ICD-10-CM | POA: Diagnosis not present

## 2015-12-13 DIAGNOSIS — M70032 Crepitant synovitis (acute) (chronic), left wrist: Secondary | ICD-10-CM | POA: Diagnosis not present

## 2015-12-13 DIAGNOSIS — M06832 Other specified rheumatoid arthritis, left wrist: Secondary | ICD-10-CM | POA: Diagnosis not present

## 2015-12-13 DIAGNOSIS — M25532 Pain in left wrist: Secondary | ICD-10-CM | POA: Diagnosis not present

## 2015-12-16 DIAGNOSIS — M7552 Bursitis of left shoulder: Secondary | ICD-10-CM | POA: Diagnosis not present

## 2015-12-16 DIAGNOSIS — Z79891 Long term (current) use of opiate analgesic: Secondary | ICD-10-CM | POA: Diagnosis not present

## 2015-12-16 DIAGNOSIS — M47817 Spondylosis without myelopathy or radiculopathy, lumbosacral region: Secondary | ICD-10-CM | POA: Diagnosis not present

## 2015-12-16 DIAGNOSIS — M47812 Spondylosis without myelopathy or radiculopathy, cervical region: Secondary | ICD-10-CM | POA: Diagnosis not present

## 2015-12-16 DIAGNOSIS — G4700 Insomnia, unspecified: Secondary | ICD-10-CM | POA: Diagnosis not present

## 2015-12-16 DIAGNOSIS — G894 Chronic pain syndrome: Secondary | ICD-10-CM | POA: Diagnosis not present

## 2015-12-16 DIAGNOSIS — M069 Rheumatoid arthritis, unspecified: Secondary | ICD-10-CM | POA: Diagnosis not present

## 2015-12-16 DIAGNOSIS — F329 Major depressive disorder, single episode, unspecified: Secondary | ICD-10-CM | POA: Diagnosis not present

## 2015-12-16 DIAGNOSIS — G4733 Obstructive sleep apnea (adult) (pediatric): Secondary | ICD-10-CM | POA: Diagnosis not present

## 2015-12-17 DIAGNOSIS — M25532 Pain in left wrist: Secondary | ICD-10-CM | POA: Diagnosis not present

## 2015-12-23 ENCOUNTER — Encounter: Payer: Self-pay | Admitting: Osteopathic Medicine

## 2015-12-23 DIAGNOSIS — I10 Essential (primary) hypertension: Secondary | ICD-10-CM | POA: Diagnosis not present

## 2015-12-23 DIAGNOSIS — G2581 Restless legs syndrome: Secondary | ICD-10-CM | POA: Diagnosis not present

## 2015-12-23 DIAGNOSIS — G4733 Obstructive sleep apnea (adult) (pediatric): Secondary | ICD-10-CM | POA: Diagnosis not present

## 2015-12-24 DIAGNOSIS — M25532 Pain in left wrist: Secondary | ICD-10-CM | POA: Diagnosis not present

## 2016-01-03 DIAGNOSIS — M06832 Other specified rheumatoid arthritis, left wrist: Secondary | ICD-10-CM | POA: Diagnosis not present

## 2016-01-03 DIAGNOSIS — G5602 Carpal tunnel syndrome, left upper limb: Secondary | ICD-10-CM | POA: Diagnosis not present

## 2016-01-03 DIAGNOSIS — Z4789 Encounter for other orthopedic aftercare: Secondary | ICD-10-CM | POA: Diagnosis not present

## 2016-01-14 DIAGNOSIS — M069 Rheumatoid arthritis, unspecified: Secondary | ICD-10-CM | POA: Diagnosis not present

## 2016-01-14 DIAGNOSIS — G894 Chronic pain syndrome: Secondary | ICD-10-CM | POA: Diagnosis not present

## 2016-01-14 DIAGNOSIS — M47812 Spondylosis without myelopathy or radiculopathy, cervical region: Secondary | ICD-10-CM | POA: Diagnosis not present

## 2016-01-14 DIAGNOSIS — M47817 Spondylosis without myelopathy or radiculopathy, lumbosacral region: Secondary | ICD-10-CM | POA: Diagnosis not present

## 2016-01-17 ENCOUNTER — Encounter: Payer: Self-pay | Admitting: Osteopathic Medicine

## 2016-01-17 ENCOUNTER — Telehealth: Payer: Self-pay | Admitting: Osteopathic Medicine

## 2016-01-17 DIAGNOSIS — Z8669 Personal history of other diseases of the nervous system and sense organs: Secondary | ICD-10-CM | POA: Insufficient documentation

## 2016-01-17 NOTE — Telephone Encounter (Signed)
Spoke to patient gave him results as noted below. Hershell Brandl,CMA  

## 2016-01-17 NOTE — Telephone Encounter (Signed)
Please call patient: I have reviewed results of sleep study. There is some very minimal obstructive sleep apnea but nothing that requires treatment with CPAP. Any questions, we can discuss results in detail at his next visit.

## 2016-01-23 ENCOUNTER — Telehealth: Payer: Self-pay | Admitting: Family Medicine

## 2016-01-23 NOTE — Telephone Encounter (Signed)
Please call patient: During his presurgical evaluation he screened positive for possible sleep apnea. Based on that screen test I would recommend he actually be evaluated. If he is okay with moving forward with an overnight sleep study then please let us know.  "This patient has screened at an elevated risk for obstructed sleep apnea using the STOP Bang Tool during a pre-surgical visit. A score of 5 or greater is an elevated risk. "  Nani Gasser, MD

## 2016-01-24 NOTE — Telephone Encounter (Signed)
Left detailed message on patient vm with results as noted below.Advise patient to let us know if he wants to move forward with a sleep study. Rhonda Cunningham,CMA

## 2016-01-31 DIAGNOSIS — G5602 Carpal tunnel syndrome, left upper limb: Secondary | ICD-10-CM | POA: Diagnosis not present

## 2016-01-31 DIAGNOSIS — Z4789 Encounter for other orthopedic aftercare: Secondary | ICD-10-CM | POA: Diagnosis not present

## 2016-01-31 DIAGNOSIS — M06832 Other specified rheumatoid arthritis, left wrist: Secondary | ICD-10-CM | POA: Diagnosis not present

## 2016-01-31 DIAGNOSIS — M70032 Crepitant synovitis (acute) (chronic), left wrist: Secondary | ICD-10-CM | POA: Diagnosis not present

## 2016-02-04 ENCOUNTER — Encounter: Payer: Self-pay | Admitting: Osteopathic Medicine

## 2016-02-04 ENCOUNTER — Ambulatory Visit (INDEPENDENT_AMBULATORY_CARE_PROVIDER_SITE_OTHER): Payer: Medicare Other | Admitting: Osteopathic Medicine

## 2016-02-04 VITALS — BP 131/90 | HR 93 | Temp 98.0°F | Ht 67.0 in | Wt 227.0 lb

## 2016-02-04 DIAGNOSIS — J069 Acute upper respiratory infection, unspecified: Secondary | ICD-10-CM | POA: Diagnosis not present

## 2016-02-04 DIAGNOSIS — L989 Disorder of the skin and subcutaneous tissue, unspecified: Secondary | ICD-10-CM

## 2016-02-04 DIAGNOSIS — B9789 Other viral agents as the cause of diseases classified elsewhere: Secondary | ICD-10-CM | POA: Diagnosis not present

## 2016-02-04 DIAGNOSIS — G2581 Restless legs syndrome: Secondary | ICD-10-CM | POA: Diagnosis not present

## 2016-02-04 DIAGNOSIS — I10 Essential (primary) hypertension: Secondary | ICD-10-CM | POA: Diagnosis not present

## 2016-02-04 MED ORDER — PREGABALIN 50 MG PO CAPS: 50.0000 mg | ORAL_CAPSULE | Freq: Every day | ORAL | 2 refills | Status: DC

## 2016-02-04 MED ORDER — CHLORTHALIDONE 50 MG PO TABS
50.0000 mg | ORAL_TABLET | Freq: Every day | ORAL | 1 refills | Status: DC
Start: 2016-02-04 — End: 2016-07-09

## 2016-02-04 MED ORDER — DEXTROMETHORPHAN HBR 15 MG/5ML PO SYRP: 10.0000 mL | ORAL_SOLUTION | Freq: Four times a day (QID) | ORAL | 0 refills | Status: DC | PRN

## 2016-02-04 MED ORDER — IPRATROPIUM BROMIDE 0.03 % NA SOLN: 2.0000 | Freq: Three times a day (TID) | NASAL | 0 refills | Status: DC | PRN

## 2016-02-04 NOTE — Patient Instructions (Addendum)
Plan:  Sleep study did not show significant apnea - we may need to repeat this eventually depending on your symptoms   Quit smoking!   Trial cough medicine and nasal spray for viral respiratory symptoms, call us if no better in 5 days from initial onset of symptoms  We will get you scheduled for removal of skin lesion

## 2016-02-04 NOTE — Progress Notes (Signed)
HPI: [name] is a 46 y.o. male  who presents to [facility] today, 02/04/16,  for chief complaint of:  Chief Complaint  Patient presents with  . Follow-up    sleep study     Patient was told he needed to come back to discuss results of sleep study. Notes were reviewed, I had already reviewed sleep study results from him which demonstrated mild apnea but no need for CPAP treatment, apparently after this information was relayed to the patient is a call that he screened positive on the stop bang questionnaire and should be tested for sleep apnea, however he has already had this test done. Advised patient that if he was only here to discuss this issue today to be sent home without billing charge due to communication mixup, but he has other medical concerns to discuss today  Skin concern: Patient has bothersome skin lesion in gluteal cleft that he would like removed, previously told by Dr. Ivan Anchors that this looked okay but if it was bothering him to take it off.  Upper respiratory illness: Patient sick for about 2 days with cough, no chest pain on exertion, no fever or chills, no productive cough. History over-the-counter Robitussin. Reports minimal sinus drainage and sore throat as well   Request refill on medications for blood pressure and restless leg. No chest pain, pressure, shortness of breath. No home blood pressures to report. Retroflex stable on Lyrica.  Past medical, surgical, social and family history reviewed: Patient Active Problem List   Diagnosis Date Noted  . History of obstructive sleep apnea 01/17/2016  . Arthritis of left wrist 11/06/2015  . Left hand pain 08/27/2015  . Rectal bleeding 05/11/2013  . Hyperglycemia 11/02/2012  . Tinnitus 11/02/2012  . Major depressive disorder, single episode, severe, without mention of psychotic behavior 08/31/2012  . Infected sebaceous cyst of skin 04/19/2012  . Hypercalcemia 03/16/2012  . Tobacco  use 03/04/2012  . Restless leg 02/12/2012  . Degenerative disc disease, cervical 12/28/2011  . Hyperlipidemia LDL goal <130 12/21/2011  . Rheumatoid arthritis (HCC) 12/17/2011  . Essential hypertension 12/11/2011  . Lumbar degenerative disc disease 12/11/2011  . Nephrolithiasis 12/11/2011  . Chronic pain of multiple joints 12/11/2011  . Right shoulder pain 12/11/2011  . Left shoulder pain 12/11/2011   Past Surgical History:  Procedure Laterality Date  . ANTERIOR CERVICAL DECOMP/DISCECTOMY FUSION    . CARPAL TUNNEL RELEASE Left 11/06/2015   Procedure: LEFT WRIST CARPAL TUNNEL RELEASE;  Surgeon: Bradly Bienenstock, MD;  Location: [facility] OR;  Service: Orthopedics;  Laterality: Left;  . HEMORROIDECTOMY    . KIDNEY STONE SURGERY    . WRIST FUSION Left 11/06/2015   Procedure: ARTHRODESIS LEFT WRIST;  Surgeon: Bradly Bienenstock, MD;  Location: [facility] OR;  Service: Orthopedics;  Laterality: Left;   Social History  Substance Use Topics  . Smoking status: Current Every Day Smoker    Packs/day: 0.50    Years: 29.00  . Smokeless tobacco: Never Used     Comment: Nicotrol  . Alcohol use No   Family History  Problem Relation Age of Onset  . Hypertension Father   . Rheum arthritis Father   . Alcohol abuse Father   . Heart attack Father   . Diabetes Mellitus II Father   . Coronary artery disease Father   . Diabetes Mellitus II Mother   . Cancer - Colon Mother   . Coronary artery disease Mother   . Heart attack Mother   .  Ovarian cancer Mother   . Uterine cancer Mother   . Dementia Neg Hx   . Bipolar disorder Neg Hx   . Depression Neg Hx   . Drug abuse Neg Hx   . OCD Neg Hx      Current medication list and allergy/intolerance information reviewed:   Current Outpatient Prescriptions on File Prior to Visit  Medication Sig Dispense Refill  . Adalimumab (HUMIRA PEN [place]) Inject into the skin.    Marland Kitchen AMBULATORY NON FORMULARY MEDICATION Medication Name: Gilmer Mor, use as needed.  Dx: Rheumatoid Arthritis 1 Units  0  . amLODipine (NORVASC) 10 MG tablet One tablet by mouth every day for blood pressure control. 90 tablet 2  . chlorthalidone (HYGROTON) 50 MG tablet Take 1 tablet (50 mg total) by mouth daily. 90 tablet 1  . cyclobenzaprine (FLEXERIL) 10 MG tablet Take 1 tablet (10 mg total) by mouth 3 (three) times daily as needed for muscle spasms. 90 tablet 5  . docusate sodium (COLACE) 100 MG capsule Take 1 capsule (100 mg total) by mouth 2 (two) times daily. 10 capsule 0  . eletriptan (RELPAX) 40 MG tablet Take 1 tablet (40 mg total) by mouth as needed for migraine or headache. May repeat in 2 hours if headache persists or recurs. 6 tablet 1  . escitalopram (LEXAPRO) 10 MG tablet Take 1 tablet (10 mg total) by mouth daily. 30 tablet 1  . methotrexate (RHEUMATREX) 2.5 MG tablet Take 20 mg by mouth once a week. Caution:Chemotherapy. Protect from light.    . naratriptan (AMERGE) 2.5 MG tablet Take 1 tablet (2.5 mg total) by mouth as needed for migraine. Take one (1) tablet at onset of headache; if returns or does not resolve, may repeat after 4 hours; do not exceed five (5) mg in 24 hours. 10 tablet 0  . oxyCODONE-acetaminophen (PERCOCET) 10-325 MG tablet Take 1 tablet by mouth every 6 (six) hours as needed for pain. 90 tablet 0  . pantoprazole (PROTONIX) 40 MG tablet Take 1 tablet (40 mg total) by mouth daily. 90 tablet 1  . pregabalin (LYRICA) 50 MG capsule Take 1 capsule (50 mg total) by mouth daily. 90 capsule 2  . traZODone (DESYREL) 50 MG tablet Take 1 tablet (50 mg total) by mouth at bedtime as needed for sleep. Take one-half to two tablets at bedtime. 90 tablet 0  . vitamin C (ASCORBIC ACID) 500 MG tablet Take 1 tablet (500 mg total) by mouth daily. 50 tablet 0  . [DISCONTINUED] FLUoxetine (PROZAC) 40 MG capsule Take 1 capsule (40 mg total) by mouth 2 (two) times daily. 180 capsule 0   No current facility-administered medications on file prior to visit.    Allergies  Allergen Reactions  . Benadryl  [Diphenhydramine Hcl] Other (See Comments)    Causes severe anxiety  . Benzamide Derivatives Other (See Comments)    blisters  . Benzocaine Hives and Other (See Comments)    blisters  . Lisinopril Cough      Review of Systems:  Constitutional: +recent illness  HEENT: No  headache, no vision change  Cardiac: No  chest pain, No  pressure, No palpitations  Respiratory:  No  shortness of breath. +Cough  Gastrointestinal: No  abdominal pain, no change on bowel habits  Musculoskeletal: No new myalgia/arthralgia  Skin: No  Rash, + bothersome lesion in gluteal cleft as per history of present illness  Hem/Onc: No  easy bruising/bleeding, No  abnormal lumps/bumps  Neurologic: No  weakness, No  Dizziness  Psychiatric: No  concerns with depression, No  concerns with anxiety  Exam:  BP 131/90   Pulse 93   Temp 98 F (36.7 C) (Oral)   Ht 5\' 7"  (1.702 m)   Wt 227 lb (103 kg)   BMI 35.55 kg/m   Constitutional: VS see above. General Appearance: alert, well-developed, well-nourished, NAD  Eyes: Normal lids and conjunctive, non-icteric sclera  Ears, Nose, Mouth, Throat: MMM, Normal external inspection ears/nares/mouth/lips/gums. TM normal bilaterally, normal pharynx  Neck: No masses, trachea midline. No lymphadenopathy  Respiratory: Normal respiratory effort. no wheeze, no rhonchi, no rales  Cardiovascular: S1/S2 normal, no murmur, no rub/gallop auscultated. RRR.   Musculoskeletal: Gait normal. Symmetric and independent movement of all extremities  Neurological: Normal balance/coordination. No tremor.  Skin: warm, dry, intact. Nodular lesion 0.5-0.75 cm on left side of upper gluteal cleft, no ulceration/drainage, no erythema  Psychiatric: Normal judgment/insight. Normal mood and affect. Oriented x3.    No results found for this or any previous visit (from the past 72 hour(s)).  No results found.   ASSESSMENT/PLAN:   Viral URI with cough - Supportive care, return to  clinic if no improvement in the next few days but most likely viral illness, lung sounds normal, no fever - Plan: ipratropium (ATROVENT) 0.03 % nasal spray, dextromethorphan 15 MG/5ML syrup  Restless leg - Plan: pregabalin (LYRICA) 50 MG capsule  Essential hypertension - Plan: chlorthalidone (HYGROTON) 50 MG tablet  Skin lesion - Plan to return to clinic for diagnostic and therapeutic excision    Patient Instructions  Plan:  Sleep study did not show significant apnea - we may need to repeat this eventually depending on your symptoms   Quit smoking!   Trial cough medicine and nasal spray for viral respiratory symptoms, call 01-12-1991 if no better in 5 days from initial onset of symptoms  We will get you scheduled for removal of skin lesion    Visit summary with medication list and pertinent instructions was printed for patient to review. All questions at time of visit were answered - patient instructed to contact office with any additional concerns. ER/RTC precautions were reviewed with the patient. Follow-up plan: Return for ANNUAL PHYSICAL when due, sooner if needed.

## 2016-02-18 ENCOUNTER — Ambulatory Visit (HOSPITAL_COMMUNITY): Payer: Self-pay | Admitting: Psychiatry

## 2016-02-21 DIAGNOSIS — G894 Chronic pain syndrome: Secondary | ICD-10-CM | POA: Diagnosis not present

## 2016-02-21 DIAGNOSIS — M069 Rheumatoid arthritis, unspecified: Secondary | ICD-10-CM | POA: Diagnosis not present

## 2016-02-21 DIAGNOSIS — M47817 Spondylosis without myelopathy or radiculopathy, lumbosacral region: Secondary | ICD-10-CM | POA: Diagnosis not present

## 2016-02-21 DIAGNOSIS — M47812 Spondylosis without myelopathy or radiculopathy, cervical region: Secondary | ICD-10-CM | POA: Diagnosis not present

## 2016-03-06 ENCOUNTER — Encounter: Payer: Self-pay | Admitting: Osteopathic Medicine

## 2016-03-06 ENCOUNTER — Ambulatory Visit (INDEPENDENT_AMBULATORY_CARE_PROVIDER_SITE_OTHER): Payer: Medicare Other | Admitting: Osteopathic Medicine

## 2016-03-06 ENCOUNTER — Encounter: Payer: Self-pay | Admitting: *Deleted

## 2016-03-06 ENCOUNTER — Ambulatory Visit: Payer: Medicare Other | Admitting: *Deleted

## 2016-03-06 VITALS — BP 120/85 | HR 75 | Ht 67.0 in | Wt 237.0 lb

## 2016-03-06 VITALS — BP 139/88 | HR 76 | Temp 97.6°F | Ht 67.0 in | Wt 237.0 lb

## 2016-03-06 DIAGNOSIS — Z0000 Encounter for general adult medical examination without abnormal findings: Secondary | ICD-10-CM

## 2016-03-06 DIAGNOSIS — E785 Hyperlipidemia, unspecified: Secondary | ICD-10-CM

## 2016-03-06 DIAGNOSIS — G43809 Other migraine, not intractable, without status migrainosus: Secondary | ICD-10-CM

## 2016-03-06 DIAGNOSIS — I10 Essential (primary) hypertension: Secondary | ICD-10-CM | POA: Diagnosis not present

## 2016-03-06 DIAGNOSIS — L723 Sebaceous cyst: Secondary | ICD-10-CM

## 2016-03-06 DIAGNOSIS — G894 Chronic pain syndrome: Secondary | ICD-10-CM

## 2016-03-06 DIAGNOSIS — Z720 Tobacco use: Secondary | ICD-10-CM

## 2016-03-06 MED ORDER — SUMATRIPTAN SUCCINATE 100 MG PO TABS: 50.0000 mg | ORAL_TABLET | ORAL | 2 refills | Status: DC | PRN

## 2016-03-06 MED ORDER — VARENICLINE TARTRATE 0.5 MG X 11 & 1 MG X 42 PO MISC: ORAL | 0 refills | Status: DC

## 2016-03-06 MED ORDER — VARENICLINE TARTRATE 1 MG PO TABS: 1.0000 mg | ORAL_TABLET | Freq: Two times a day (BID) | ORAL | 1 refills | Status: DC

## 2016-03-06 MED ORDER — CYCLOBENZAPRINE HCL 10 MG PO TABS: 10.0000 mg | ORAL_TABLET | Freq: Three times a day (TID) | ORAL | 5 refills | Status: DC | PRN

## 2016-03-06 MED ORDER — ATORVASTATIN CALCIUM 40 MG PO TABS: 40.0000 mg | ORAL_TABLET | Freq: Every day | ORAL | 3 refills | Status: DC

## 2016-03-06 NOTE — Patient Instructions (Addendum)
Health maintenance:HIV Screen. This is done/offered once in a person's life.   Abnormal screenings: None identified.   Patient concerns: Smoking cesation.   Nurse concerns:BMI and weight. Smoking cesation-He is going to try Chantix and f/u with PCP in 4 weeks.   Next PCP appt: 4 weeks and then in one year, your next AWV-S will be due.

## 2016-03-06 NOTE — Patient Instructions (Addendum)
Plan:  Try new headache medication Imitrex, let me know if any problems. We have the option to try daily preventive headache medicine as well.  Plan to come back in 4 weeks or so for recheck headaches & remove skin problem  Start Chantix for quitting smoking, if any problems getting that medicine, let me know!   We are starting a cholesterol medication as well Lipitor

## 2016-03-06 NOTE — Progress Notes (Signed)
HPI: [name] is a 46 y.o. male  who presents to [facility] [place] today, 03/06/16,  for chief complaint of:  Chief Complaint  Patient presents with  . Annual Exam    See below for review of preventive care  HTN: Blood pressure improved on manual recheck. No chest pain, pressure, shortness breath. No home blood pressures to report.  HLD: Patient reports that could be doing better on low-fat diet and exercise. Chronic pain inhibits a lot of exercise for him. HDL low and LDL elevated. ASCVD risk calculated put him in statin benefit group.  Tobacco abuse: requests Chantix to assist with quiiting  Migraine: Previous triptan were to expensive, having headaches 1-2 times per week. Stable over past year or so.  Chronic pain: Is following with pain management. Severe rheumatoid arthritis and degenerative disc disease. Stable.  Skin lesion: There was some confusion about making appointment to remove this. Patient states that he was expecting a call and he never scheduled as an appointment.    Past medical, surgical, social and family history reviewed: Patient Active Problem List   Diagnosis Date Noted  . History of obstructive sleep apnea 01/17/2016  . Arthritis of left wrist 11/06/2015  . Left hand pain 08/27/2015  . Rectal bleeding 05/11/2013  . Hyperglycemia 11/02/2012  . Tinnitus 11/02/2012  . Major depressive disorder, single episode, severe, without mention of psychotic behavior 08/31/2012  . Infected sebaceous cyst of skin 04/19/2012  . Hypercalcemia 03/16/2012  . Tobacco use 03/04/2012  . Restless leg 02/12/2012  . Degenerative disc disease, cervical 12/28/2011  . Hyperlipidemia LDL goal <130 12/21/2011  . Rheumatoid arthritis (HCC) 12/17/2011  . Essential hypertension 12/11/2011  . Lumbar degenerative disc disease 12/11/2011  . Nephrolithiasis 12/11/2011  . Chronic pain of multiple joints 12/11/2011  . Right shoulder pain 12/11/2011  .  Left shoulder pain 12/11/2011   Past Surgical History:  Procedure Laterality Date  . ANTERIOR CERVICAL DECOMP/DISCECTOMY FUSION    . CARPAL TUNNEL RELEASE Left 11/06/2015   Procedure: LEFT WRIST CARPAL TUNNEL RELEASE;  Surgeon: Bradly Bienenstock, MD;  Location: [facility] OR;  Service: Orthopedics;  Laterality: Left;  . HEMORROIDECTOMY    . KIDNEY STONE SURGERY    . WRIST FUSION Left 11/06/2015   Procedure: ARTHRODESIS LEFT WRIST;  Surgeon: Bradly Bienenstock, MD;  Location: [facility] OR;  Service: Orthopedics;  Laterality: Left;   Social History  Substance Use Topics  . Smoking status: Current Every Day Smoker    Packs/day: 0.50    Years: 29.00  . Smokeless tobacco: Never Used     Comment: Nicotrol  . Alcohol use No   Family History  Problem Relation Age of Onset  . Hypertension Father   . Rheum arthritis Father   . Alcohol abuse Father   . Heart attack Father   . Diabetes Mellitus II Father   . Coronary artery disease Father   . Diabetes Mellitus II Mother   . Cancer - Colon Mother   . Coronary artery disease Mother   . Heart attack Mother   . Ovarian cancer Mother   . Uterine cancer Mother   . Dementia Neg Hx   . Bipolar disorder Neg Hx   . Depression Neg Hx   . Drug abuse Neg Hx   . OCD Neg Hx      Current medication list and allergy/intolerance information reviewed:   Current Outpatient Prescriptions  Medication Sig Dispense Refill  . Adalimumab (HUMIRA PEN [place]) Inject into the  skin.    Marland Kitchen AMBULATORY NON FORMULARY MEDICATION Medication Name: Gilmer Mor, use as needed.  Dx: Rheumatoid Arthritis 1 Units 0  . amLODipine (NORVASC) 10 MG tablet One tablet by mouth every day for blood pressure control. 90 tablet 2  . chlorthalidone (HYGROTON) 50 MG tablet Take 1 tablet (50 mg total) by mouth daily. 90 tablet 1  . cyclobenzaprine (FLEXERIL) 10 MG tablet Take 1 tablet (10 mg total) by mouth 3 (three) times daily as needed for muscle spasms. 90 tablet 5  . dextromethorphan 15 MG/5ML syrup Take 10 mLs (30  mg total) by mouth 4 (four) times daily as needed for cough. 118 mL 0  . docusate sodium (COLACE) 100 MG capsule Take 1 capsule (100 mg total) by mouth 2 (two) times daily. 10 capsule 0  . eletriptan (RELPAX) 40 MG tablet Take 1 tablet (40 mg total) by mouth as needed for migraine or headache. May repeat in 2 hours if headache persists or recurs. 6 tablet 1  . escitalopram (LEXAPRO) 10 MG tablet Take 1 tablet (10 mg total) by mouth daily. 30 tablet 1  . ipratropium (ATROVENT) 0.03 % nasal spray Place 2 sprays into both nostrils 3 (three) times daily as needed (nasal congestion). 30 mL 0  . methotrexate (RHEUMATREX) 2.5 MG tablet Take 20 mg by mouth once a week. Caution:Chemotherapy. Protect from light.    . naratriptan (AMERGE) 2.5 MG tablet Take 1 tablet (2.5 mg total) by mouth as needed for migraine. Take one (1) tablet at onset of headache; if returns or does not resolve, may repeat after 4 hours; do not exceed five (5) mg in 24 hours. 10 tablet 0  . oxyCODONE-acetaminophen (PERCOCET) 10-325 MG tablet Take 1 tablet by mouth every 6 (six) hours as needed for pain. 90 tablet 0  . pantoprazole (PROTONIX) 40 MG tablet Take 1 tablet (40 mg total) by mouth daily. 90 tablet 1  . pregabalin (LYRICA) 50 MG capsule Take 1 capsule (50 mg total) by mouth daily. 90 capsule 2  . traZODone (DESYREL) 50 MG tablet Take 1 tablet (50 mg total) by mouth at bedtime as needed for sleep. Take one-half to two tablets at bedtime. 90 tablet 0  . vitamin C (ASCORBIC ACID) 500 MG tablet Take 1 tablet (500 mg total) by mouth daily. 50 tablet 0   No current facility-administered medications for this visit.    Allergies  Allergen Reactions  . Benadryl [Diphenhydramine Hcl] Other (See Comments)    Causes severe anxiety  . Benzamide Derivatives Other (See Comments)    blisters  . Benzocaine Hives and Other (See Comments)    blisters  . Lisinopril Cough      Review of Systems:  Constitutional:  No  fever, no chills,  No recent illness  HEENT: +headache, no vision change, no hearing change, No sore throat, No  sinus pressure  Cardiac: No  chest pain, No  pressure, No palpitations, No  Orthopnea  Respiratory:  No  shortness of breath. No  Cough  Gastrointestinal: No  abdominal pain, No  nausea, No  vomiting,  No  blood in stool, No  diarrhea, No  constipation   Musculoskeletal: No new myalgia/arthralgia  Skin: No  Rash, +other wounds/concerning lesions  Neurologic: No  weakness, No  dizziness  Psychiatric: No  concerns with depression, No  concerns with anxiet  Exam:  BP 139/88   Pulse 75   Ht 5\' 7"  (1.702 m)   Wt 237 lb (107.5 kg)  BMI 37.12 kg/m   Constitutional: VS see above. General Appearance: alert, well-developed, well-nourished, NAD  Eyes: Normal lids and conjunctive, non-icteric sclera  Ears, Nose, Mouth, Throat: MMM, Normal external inspection ears/nares/mouth/lips/gums. TM normal bilaterally. Pharynx/tonsils no erythema, no exudate. Nasal mucosa normal.   Neck: No masses, trachea midline. No thyroid enlargement. No tenderness/mass appreciated. No lymphadenopathy  Respiratory: Normal respiratory effort. no wheeze, no rhonchi, no rales  Cardiovascular: S1/S2 normal, no murmur, no rub/gallop auscultated. RRR. No lower extremity edema.   Gastrointestinal: Nontender, no masses. No hepatomegaly, no splenomegaly. No hernia appreciated. Bowel sounds normal. Rectal exam deferred.   Musculoskeletal: Gait normal. No clubbing/cyanosis of digits.   Neurological: Normal balance/coordination. No tremor. PERRLA, EOMI, cerebellar reflexes intact, no nystagmus   Skin: warm, dry, intact. No rash/ulcer.   Psychiatric: Normal judgment/insight. Normal mood and affect. Oriented x3.   Labs from 11/2015  Discussed in detail with the patient   ASSESSMENT/PLAN:   Annual physical exam  Essential hypertension - controlled  Sebaceous cyst - Patient needs to make appointment for  removal  Other migraine without status migrainosus, not intractable - Plan: SUMAtriptan (IMITREX) 100 MG tablet  Hyperlipidemia, unspecified hyperlipidemia type - ASCVD risk greater than 7.5%, initiate statin today - Plan: atorvastatin (LIPITOR) 40 MG tablet  Tobacco abuse - Plan: varenicline (CHANTIX STARTING MONTH PAK) 0.5 MG X 11 & 1 MG X 42 tablet, varenicline (CHANTIX CONTINUING MONTH PAK) 1 MG tablet  Chronic pain syndrome - Plan: cyclobenzaprine (FLEXERIL) 10 MG tablet    MALE PREVENTIVE CARE  updated 03/08/16  ANNUAL SCREENING/COUNSELING  Any changes to health in the past year? None major per patient  Diet/Exercise - HEALTHY HABITS DISCUSSED TO DECREASE CV RISK History  Smoking Status  . Current Every Day Smoker  . Packs/day: 0.50  . Years: 30.00  . Types: Cigarettes  Smokeless Tobacco  . Former Neurosurgeon  . Types: Chew  . Quit date: 1988    Comment: Rx written for Chantix from Dr. Lyn Hollingshead.   History  Alcohol Use No  some social drinking  Depression screen [facility] 2/9 03/06/2016  Decreased Interest 0  Down, Depressed, Hopeless 0  PHQ - 2 Score 0  Altered sleeping 3  Tired, decreased energy 3  Change in appetite 0  Feeling bad or failure about yourself  0  Trouble concentrating 0  Moving slowly or fidgety/restless 0  Suicidal thoughts 0  PHQ-9 Score 6    INFECTIOUS DISEASE SCREENING  HIV - needs - declined  GC/CT - does not need  HepC - does not need  TB - does not need  CANCER SCREENING  Lung - USPSTF: 55-80yo w/ 30 py hx unless quit w/in 32yr - does not need  Colon - does not need  Prostate - does not need  OTHER DISEASE SCREENING  Lipid - does not need  DM2 - does not need  AAA - 65-75yo ever smoked: does not need  Osteoporosis - men 47yo+ - does not need  ADULT VACCINATION  Influenza - annual vaccine recommended  Td - booster every 10 years   Zoster - option at 81, yes at 60+   PCV13 - was not indicated  PPSV23 - was not  indicated Immunization History  Administered Date(s) Administered  . Influenza, Seasonal, Injecte, Preservative Fre 02/12/2012  . Influenza,inj,Quad PF,36+ Mos 10/02/2014, 09/25/2015  . PPD Test 05/14/2014  . Tdap 11/12/2014      Patient Instructions  Plan:  Try new headache medication Imitrex, let me know if any  problems. We have the option to try daily preventive headache medicine as well.  Plan to come back in 4 weeks or so for recheck headaches & remove skin problem  Start Chantix for quitting smoking, if any problems getting that medicine, let me know!   We are starting a cholesterol medication as well Lipitor      Visit summary with medication list and pertinent instructions was printed for patient to review. All questions at time of visit were answered - patient instructed to contact office with any additional concerns. ER/RTC precautions were reviewed with the patient. Follow-up plan: Return in about 4 weeks (around 04/03/2016) for headaches, skin (OV30).

## 2016-03-06 NOTE — Progress Notes (Signed)
Subjective:   [name] is a 46 y.o. male who presents for an Initial Medicare Annual Wellness Visit.  Review of Systems   Cardiac Risk Factors include: dyslipidemia;family history of premature cardiovascular disease;hypertension;male gender;obesity (BMI >30kg/m2);sedentary lifestyle;smoking/ tobacco exposure    Objective:    Today's Vitals   03/06/16 0914  BP: 139/88  Pulse: 76  Temp: 97.6 F (36.4 C)  TempSrc: Oral  SpO2: 97%  Weight: 237 lb (107.5 kg)  Height: 5\' 7"  (1.702 m)   Body mass index is 37.12 kg/m. Pt. Would like to lose some weight and bring his BMI within normal range.  Current Medications (verified) Outpatient Encounter Prescriptions as of 03/06/2016  Medication Sig  . Adalimumab (HUMIRA PEN [place]) Inject into the skin.  Marland Kitchen AMBULATORY NON FORMULARY MEDICATION Medication Name: Gilmer Mor, use as needed.  Dx: Rheumatoid Arthritis  . amLODipine (NORVASC) 10 MG tablet One tablet by mouth every day for blood pressure control.  Marland Kitchen atorvastatin (LIPITOR) 40 MG tablet Take 1 tablet (40 mg total) by mouth daily.  . chlorthalidone (HYGROTON) 50 MG tablet Take 1 tablet (50 mg total) by mouth daily.  . cyclobenzaprine (FLEXERIL) 10 MG tablet Take 1 tablet (10 mg total) by mouth 3 (three) times daily as needed for muscle spasms.  Marland Kitchen docusate sodium (COLACE) 100 MG capsule Take 1 capsule (100 mg total) by mouth 2 (two) times daily.  Marland Kitchen escitalopram (LEXAPRO) 10 MG tablet Take 1 tablet (10 mg total) by mouth daily.  . methotrexate (RHEUMATREX) 2.5 MG tablet Take 20 mg by mouth once a week. Caution:Chemotherapy. Protect from light.  Marland Kitchen oxyCODONE-acetaminophen (PERCOCET) 10-325 MG tablet Take 1 tablet by mouth every 6 (six) hours as needed for pain.  . pantoprazole (PROTONIX) 40 MG tablet Take 1 tablet (40 mg total) by mouth daily.  . pregabalin (LYRICA) 50 MG capsule Take 1 capsule (50 mg total) by mouth daily.  . SUMAtriptan (IMITREX) 100 MG tablet Take 0.5 tablets (50 mg total) by  mouth every 2 (two) hours as needed for migraine. May repeat in 2 hours if headache persists or recurs.  Marland Kitchen dextromethorphan 15 MG/5ML syrup Take 10 mLs (30 mg total) by mouth 4 (four) times daily as needed for cough. (Patient not taking: Reported on 03/06/2016)  . ipratropium (ATROVENT) 0.03 % nasal spray Place 2 sprays into both nostrils 3 (three) times daily as needed (nasal congestion). (Patient not taking: Reported on 03/06/2016)  . traZODone (DESYREL) 50 MG tablet Take 1 tablet (50 mg total) by mouth at bedtime as needed for sleep. Take one-half to two tablets at bedtime.  . vitamin C (ASCORBIC ACID) 500 MG tablet Take 1 tablet (500 mg total) by mouth daily.  . [DISCONTINUED] cyclobenzaprine (FLEXERIL) 10 MG tablet Take 1 tablet (10 mg total) by mouth 3 (three) times daily as needed for muscle spasms.  . [DISCONTINUED] eletriptan (RELPAX) 40 MG tablet Take 1 tablet (40 mg total) by mouth as needed for migraine or headache. May repeat in 2 hours if headache persists or recurs.  . [DISCONTINUED] naratriptan (AMERGE) 2.5 MG tablet Take 1 tablet (2.5 mg total) by mouth as needed for migraine. Take one (1) tablet at onset of headache; if returns or does not resolve, may repeat after 4 hours; do not exceed five (5) mg in 24 hours.   No facility-administered encounter medications on file as of 03/06/2016.     Allergies (verified) Benadryl [diphenhydramine hcl]; Benzamide derivatives; Benzocaine; and Lisinopril   History: Past Medical History:  Diagnosis Date  . Anxiety   . Arthritis   . Asthma    history [id number]  . Depression   . Essential hypertension 12/11/2011  . Gout   . Headache   . History of hiatal hernia   . History of lithotripsy   . Hypertension   . Kidney stones   . Lumbar degenerative disc disease 12/11/2011  . Nephrolithiasis 12/11/2011  . Pericarditis   . Renal tubular acidosis   . Rheumatoid arthritis(714.0)    Past Surgical History:  Procedure Laterality Date  .  ANTERIOR CERVICAL DECOMP/DISCECTOMY FUSION    . CARPAL TUNNEL RELEASE Left 11/06/2015   Procedure: LEFT WRIST CARPAL TUNNEL RELEASE;  Surgeon: Bradly Bienenstock, MD;  Location: [facility] OR;  Service: Orthopedics;  Laterality: Left;  . HEMORROIDECTOMY    . KIDNEY STONE SURGERY    . WRIST FUSION Left 11/06/2015   Procedure: ARTHRODESIS LEFT WRIST;  Surgeon: Bradly Bienenstock, MD;  Location: [facility] OR;  Service: Orthopedics;  Laterality: Left;   Family History  Problem Relation Age of Onset  . Hypertension Father   . Alcohol abuse Father   . Heart attack Father 60  . Diabetes Mellitus II Father   . Coronary artery disease Father   . Diabetes Mellitus II Mother     Micah Flesher into a diabetic coma, per pt.  . Cancer - Colon Mother   . Coronary artery disease Mother   . Heart attack Mother 44  . Ovarian cancer Mother   . Uterine cancer Mother   . Heart attack Brother   . Coronary artery disease Brother   . Heart murmur Sister   . Dementia Neg Hx   . Bipolar disorder Neg Hx   . Depression Neg Hx   . Drug abuse Neg Hx   . OCD Neg Hx    Social History   Occupational History  . Not on file.   Social History Main Topics  . Smoking status: Current Every Day Smoker    Packs/day: 0.50    Years: 30.00    Types: Cigarettes  . Smokeless tobacco: Former Neurosurgeon    Types: Chew    Quit date: 1988     Comment: Rx written for Chantix from Dr. Lyn Hollingshead.  . Alcohol use No  . Drug use: No  . Sexual activity: Yes    Partners: Female   Tobacco Counseling Ready to quit: Yes Counseling given: Yes   Activities of Daily Living In your present state of health, do you have any difficulty performing the following activities: 03/06/2016 11/06/2015  Hearing? N -  Vision? N -  Difficulty concentrating or making decisions? N -  Walking or climbing stairs? Y -  Dressing or bathing? N -  Doing errands, shopping? N N  Preparing Food and eating ? N -  Using the Toilet? N -  In the past six months, have you accidently  leaked urine? N -  Do you have problems with loss of bowel control? N -  Managing your Medications? N -  Managing your Finances? N -  Housekeeping or managing your Housekeeping? N -  Some recent data might be hidden    Immunizations and Health Maintenance Immunization History  Administered Date(s) Administered  . Influenza, Seasonal, Injecte, Preservative Fre 02/12/2012  . Influenza,inj,Quad PF,36+ Mos 10/02/2014, 09/25/2015  . PPD Test 05/14/2014  . Tdap 11/12/2014   Health Maintenance Due  Topic Date Due  . HIV Screening  04/30/1984   Pt. Reports that he [facility]  Patient Care Team: Sunnie Nielsen, DO as PCP - General (Osteopathic Medicine) syeed, tauseet MD (Rheumatology)  Indicate any recent Medical Services you may have received from other than [facility] providers in the past year (date may be approximate).    Assessment:   This is a routine wellness examination for [place].   Hearing/Vision screen No exam data present  Dietary issues and exercise activities discussed: Current Exercise Habits: The patient has a physically strenous job, but has no regular exercise apart from work.;The patient does not participate in regular exercise at present, Exercise limited by: cardiac condition(s);orthopedic condition(s) (Hypertension and RA)  Goals      Patient Stated   . Quit smoking / using tobacco (pt-stated)          He would like to smoking less than .5 packs of cigarettes upon his return visit in 4 weeks with Dr. Lyn Hollingshead.      Depression Screen PHQ 2/9 Scores 03/06/2016 02/04/2016  PHQ - 2 Score 0 1  PHQ- 9 Score 6 3    Fall Risk Fall Risk  03/06/2016  Falls in the past year? No    Cognitive Function:Normal CF by direct obs.     6CIT Screen 03/06/2016  What Year? 0 points  What month? 0 points  What time? 0 points  Count back from 20 0 points  Months in reverse 0 points  Repeat phrase 0 points  Total Score 0    Screening Tests Health  Maintenance  Topic Date Due  . HIV Screening  04/30/1984  . TETANUS/TDAP  11/11/2024  . INFLUENZA VACCINE  Completed   Pt. States he [facility] About a year ago to be tested for HIV.  No current record on file for this office.  He reports it was negative.     Plan:       I have personally reviewed and addressed the Medicare Annual Wellness questionnaire and have noted the following in the patient's chart:  A. Medical and social history B. Use of alcohol, tobacco or illicit drugs  C. Current medications and supplements D. Functional ability and status E.  Nutritional status-He would like to lose some weight and be in the normal range of his BMI.  BMI today was increased and 37.12.   F.  Physical activity-Reports it is difficult to do a structured activity/exercise plan with his RA. G. Advance directives-Some information given today and will mail the rest due to copier being jammed. H. List of other physicians I.  Hospitalizations, surgeries, and ER visits in previous 12 months J.  Vitals K. Screenings to include  Cognitive-CF normal by direct observation and interview with pt. Today.   Depression-H/O but feels his medication has this under control as well as his anxiety med. L. Referrals and appointments - none  In addition, I have reviewed and discussed with patient certain preventive protocols, quality metrics, and best practice recommendations. A written personalized care plan for preventive services as well as general preventive health recommendations were provided to patient.  Signed,   Berline Chough, RN       I have read and agree with the contents of the above note Vision screening not documented Dr. Sunnie Nielsen, DO

## 2016-03-08 DIAGNOSIS — G43909 Migraine, unspecified, not intractable, without status migrainosus: Secondary | ICD-10-CM | POA: Insufficient documentation

## 2016-03-31 ENCOUNTER — Encounter (HOSPITAL_COMMUNITY): Payer: Self-pay | Admitting: Psychiatry

## 2016-03-31 ENCOUNTER — Ambulatory Visit (INDEPENDENT_AMBULATORY_CARE_PROVIDER_SITE_OTHER): Payer: Medicare Other | Admitting: Psychiatry

## 2016-03-31 DIAGNOSIS — F331 Major depressive disorder, recurrent, moderate: Secondary | ICD-10-CM | POA: Diagnosis not present

## 2016-03-31 DIAGNOSIS — F39 Unspecified mood [affective] disorder: Secondary | ICD-10-CM | POA: Diagnosis not present

## 2016-03-31 DIAGNOSIS — Z811 Family history of alcohol abuse and dependence: Secondary | ICD-10-CM | POA: Diagnosis not present

## 2016-03-31 DIAGNOSIS — F411 Generalized anxiety disorder: Secondary | ICD-10-CM

## 2016-03-31 DIAGNOSIS — F4323 Adjustment disorder with mixed anxiety and depressed mood: Secondary | ICD-10-CM

## 2016-03-31 DIAGNOSIS — Z79899 Other long term (current) drug therapy: Secondary | ICD-10-CM

## 2016-03-31 DIAGNOSIS — Z79891 Long term (current) use of opiate analgesic: Secondary | ICD-10-CM

## 2016-03-31 MED ORDER — TRAZODONE HCL 100 MG PO TABS: 100.0000 mg | ORAL_TABLET | Freq: Every evening | ORAL | 1 refills | Status: DC | PRN

## 2016-03-31 MED ORDER — ESCITALOPRAM OXALATE 10 MG PO TABS: 10.0000 mg | ORAL_TABLET | Freq: Every day | ORAL | 2 refills | Status: DC

## 2016-03-31 NOTE — Progress Notes (Signed)
Patient ID: [name], male   DOB: 11/17/1970, 46 y.o.   MRN: [id number] [name] Follow-up Outpatient Visit  [name] [id number] 46 y.o.  03/31/2016  Chief Complaint: follow up for depression and anxiety.     History of Present Illness:     HPI Comments: [name] is a 46 y/o male with a past psychiatric history significant for symptoms of anxiety and depression. The patient is referred for psychiatric services for medication management.   Patient has had a wrist surgery he is adjusting to it he hasn't brought this on the left wrist. He is getting back with his fiance. Depression was stable  Anxiety not worsened. He is on pain medication because of his arthritis and wrist problems On disability.    Sleep remains poor unless he takes trazadone. On prn dose Modifying factors: his fiance is supportive but has recent a stroke. His disablity was approved 2016. Marland Kitchen Severity: Depression severity: 7/10. 10 being no depression.    . Context: Finances, Disability process,. Aggravated by current medical issues. Ruminates about his stressors.  . Modifying factors: Improves with spending time with his children.  . Associated signs and symptoms:    Past Medical History:  Diagnosis Date  . Anxiety   . Arthritis   . Asthma    history [id number]  . Depression   . Essential hypertension 12/11/2011  . Gout   . Headache   . History of hiatal hernia   . History of lithotripsy   . Hypertension   . Kidney stones   . Lumbar degenerative disc disease 12/11/2011  . Nephrolithiasis 12/11/2011  . Pericarditis   . Renal tubular acidosis   . Rheumatoid arthritis(714.0)    Family History  Problem Relation Age of Onset  . Hypertension Father   . Alcohol abuse Father   . Heart attack Father 69  . Diabetes Mellitus II Father   . Coronary artery disease Father   . Diabetes Mellitus II Mother     [name] into a diabetic coma, per pt.  . Cancer - Colon Mother    . Coronary artery disease Mother   . Heart attack Mother 73  . Ovarian cancer Mother   . Uterine cancer Mother   . Heart attack Brother   . Coronary artery disease Brother   . Heart murmur Sister   . Dementia Neg Hx   . Bipolar disorder Neg Hx   . Depression Neg Hx   . Drug abuse Neg Hx   . OCD Neg Hx     Outpatient Encounter Prescriptions as of 03/31/2016  Medication Sig  . Adalimumab (HUMIRA PEN [place]) Inject into the skin.  Marland Kitchen AMBULATORY NON FORMULARY MEDICATION Medication Name: Gilmer Mor, use as needed.  Dx: Rheumatoid Arthritis  . amLODipine (NORVASC) 10 MG tablet One tablet by mouth every day for blood pressure control.  Marland Kitchen atorvastatin (LIPITOR) 40 MG tablet Take 1 tablet (40 mg total) by mouth daily.  . chlorthalidone (HYGROTON) 50 MG tablet Take 1 tablet (50 mg total) by mouth daily.  . cyclobenzaprine (FLEXERIL) 10 MG tablet Take 1 tablet (10 mg total) by mouth 3 (three) times daily as needed for muscle spasms.  Marland Kitchen dextromethorphan 15 MG/5ML syrup Take 10 mLs (30 mg total) by mouth 4 (four) times daily as needed for cough. (Patient not taking: Reported on 03/06/2016)  . docusate sodium (COLACE) 100 MG capsule Take 1 capsule (100 mg total) by mouth 2 (two) times daily.  Marland Kitchen escitalopram (LEXAPRO) 10  MG tablet Take 1 tablet (10 mg total) by mouth daily.  Marland Kitchen ipratropium (ATROVENT) 0.03 % nasal spray Place 2 sprays into both nostrils 3 (three) times daily as needed (nasal congestion). (Patient not taking: Reported on 03/06/2016)  . methotrexate (RHEUMATREX) 2.5 MG tablet Take 20 mg by mouth once a week. Caution:Chemotherapy. Protect from light.  Marland Kitchen oxyCODONE-acetaminophen (PERCOCET) 10-325 MG tablet Take 1 tablet by mouth every 6 (six) hours as needed for pain.  . pantoprazole (PROTONIX) 40 MG tablet Take 1 tablet (40 mg total) by mouth daily.  . pregabalin (LYRICA) 50 MG capsule Take 1 capsule (50 mg total) by mouth daily.  . SUMAtriptan (IMITREX) 100 MG tablet Take 0.5 tablets (50 mg total) by  mouth every 2 (two) hours as needed for migraine. May repeat in 2 hours if headache persists or recurs.  . traZODone (DESYREL) 100 MG tablet Take 1 tablet (100 mg total) by mouth at bedtime as needed for sleep.  . varenicline (CHANTIX CONTINUING MONTH PAK) 1 MG tablet Take 1 tablet (1 mg total) by mouth 2 (two) times daily.  . varenicline (CHANTIX STARTING MONTH PAK) 0.5 MG X 11 & 1 MG X 42 tablet Take one 0.5 mg tablet by mouth once daily for 3 days, then increase to one 0.5 mg tablet twice daily for 4 days, then increase to one 1 mg tablet twice daily.  . vitamin C (ASCORBIC ACID) 500 MG tablet Take 1 tablet (500 mg total) by mouth daily.  . [DISCONTINUED] escitalopram (LEXAPRO) 10 MG tablet Take 1 tablet (10 mg total) by mouth daily.  . [DISCONTINUED] traZODone (DESYREL) 50 MG tablet Take 1 tablet (50 mg total) by mouth at bedtime as needed for sleep. Take one-half to two tablets at bedtime.   No facility-administered encounter medications on file as of 03/31/2016.     No results found for this or any previous visit (from the past 2160 hour(s)).  There were no vitals taken for this visit.   Review of Systems  Constitutional: Negative for fever.  Cardiovascular: Negative for palpitations.  Skin: Negative for rash.  Neurological: Negative for headaches.  Psychiatric/Behavioral: Negative for suicidal ideas.    Mental Status Examination  Appearance: casual and cooperative Alert: Yes Attention: fair  Cooperative: Yes Eye Contact: Good Speech: coherent Psychomotor Activity: Normal Memory/Concentration: adequate Oriented: person, place, time/date and situation Mood: improved. euthymic Affect: Constricted Thought Processes and Associations: Coherent Fund of Knowledge: Fair Thought Content: Suicidal ideation and Homicidal ideation were denied. Denies hallucinations Insight: Fair Judgement: Fair  Diagnosis: Major depressive disorder recurrent moderate to severe. Generalized anxiety  disorder. Insomnia. 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 Continue lexapro Insomnia: fair. On trazadone. Will continue but taking 100mg  will change to that    Medical complexity: mood can be effected by pain. Currently on pain meds Support is good. Reviewed meds side effects FU 3 months. Prescriptions sent.    , MD

## 2016-04-03 ENCOUNTER — Ambulatory Visit (INDEPENDENT_AMBULATORY_CARE_PROVIDER_SITE_OTHER): Payer: Medicare Other | Admitting: Osteopathic Medicine

## 2016-04-03 ENCOUNTER — Encounter: Payer: Self-pay | Admitting: Osteopathic Medicine

## 2016-04-03 VITALS — BP 147/92 | HR 100 | Wt 240.0 lb

## 2016-04-03 DIAGNOSIS — Z720 Tobacco use: Secondary | ICD-10-CM

## 2016-04-03 DIAGNOSIS — L989 Disorder of the skin and subcutaneous tissue, unspecified: Secondary | ICD-10-CM | POA: Diagnosis not present

## 2016-04-03 DIAGNOSIS — L720 Epidermal cyst: Secondary | ICD-10-CM | POA: Diagnosis not present

## 2016-04-03 MED ORDER — VARENICLINE TARTRATE 1 MG PO TABS: 1.0000 mg | ORAL_TABLET | Freq: Two times a day (BID) | ORAL | 1 refills | Status: DC

## 2016-04-03 MED ORDER — VARENICLINE TARTRATE 0.5 MG X 11 & 1 MG X 42 PO MISC: ORAL | 0 refills | Status: DC

## 2016-04-03 NOTE — Patient Instructions (Signed)
Skin Biopsy, Care After Refer to this sheet in the next few weeks. These instructions provide you with information about caring for yourself after your procedure. Your health care provider may also give you more specific instructions. Your treatment has been planned according to current medical practices, but problems sometimes occur. Call your health care provider if you have any problems or questions after your procedure. What can I expect after the procedure? After the procedure, it is common to have:  Soreness.  Bruising.  Itching. Follow these instructions at home:  Rest and then return to your normal activities as told by your health care provider.  Take over-the-counter and prescription medicines only as told by your health care provider.  Follow instructions from your health care provider about how to take care of your biopsy site.Make sure you:  Wash your hands with soap and water before you change your bandage (dressing). If soap and water are not available, use hand sanitizer.  Change your dressing as told by your health care provider.  Check your biopsy site every day for signs of infection. Check for:  More redness, swelling, or pain.  More fluid or blood.  Warmth.  Pus or a bad smell.  Keep all follow-up visits as told by your health care provider. This is important. Contact a health care provider if:  You have more redness, swelling, or pain around your biopsy site.  You have more fluid or blood coming from your biopsy site.  Your biopsy site feels warm to the touch.  You have pus or a bad smell coming from your biopsy site.  You have a fever. Get help right away if:  You have bleeding that does not stop with pressure or a dressing. This information is not intended to replace advice given to you by your health care provider. Make sure you discuss any questions you have with your health care provider. Document Released: 02/08/2015 Document Revised:  09/08/2015 Document Reviewed: 04/11/2014 Elsevier Interactive Patient Education  2017 [employer].

## 2016-04-03 NOTE — Progress Notes (Signed)
HPI: [name] is a 46 y.o. male  who presents to [facility] today, 04/03/16,  for chief complaint of:  Chief Complaint  Patient presents with  . Cyst    CYST REMOVAL ON BACK    Nodule/cyst on L buttock, occasionally will get painful and will drain, did this recently and then it got better, no recent. drainage or concern for infection. Would like to get this removed. See below for procedure note.   Past medical history, surgical history, social history and family history reviewed.  Patient Active Problem List   Diagnosis Date Noted  . Migraine 03/08/2016  . History of obstructive sleep apnea 01/17/2016  . Arthritis of left wrist 11/06/2015  . Left hand pain 08/27/2015  . Rectal bleeding 05/11/2013  . Hyperglycemia 11/02/2012  . Tinnitus 11/02/2012  . Major depressive disorder, single episode, severe, without mention of psychotic behavior 08/31/2012  . Infected sebaceous cyst of skin 04/19/2012  . Hypercalcemia 03/16/2012  . Tobacco use 03/04/2012  . Restless leg 02/12/2012  . Degenerative disc disease, cervical 12/28/2011  . Hyperlipidemia LDL goal <130 12/21/2011  . Rheumatoid arthritis (HCC) 12/17/2011  . Essential hypertension 12/11/2011  . Lumbar degenerative disc disease 12/11/2011  . Nephrolithiasis 12/11/2011  . Chronic pain of multiple joints 12/11/2011  . Right shoulder pain 12/11/2011  . Left shoulder pain 12/11/2011    Current medication list and allergy/intolerance information reviewed.   Current Outpatient Prescriptions on File Prior to Visit  Medication Sig Dispense Refill  . Adalimumab (HUMIRA PEN [place]) Inject into the skin.    Marland Kitchen AMBULATORY NON FORMULARY MEDICATION Medication Name: Gilmer Mor, use as needed.  Dx: Rheumatoid Arthritis 1 Units 0  . amLODipine (NORVASC) 10 MG tablet One tablet by mouth every day for blood pressure control. 90 tablet 2  . atorvastatin (LIPITOR) 40 MG tablet Take 1 tablet (40 mg total) by mouth  daily. 90 tablet 3  . chlorthalidone (HYGROTON) 50 MG tablet Take 1 tablet (50 mg total) by mouth daily. 90 tablet 1  . cyclobenzaprine (FLEXERIL) 10 MG tablet Take 1 tablet (10 mg total) by mouth 3 (three) times daily as needed for muscle spasms. 90 tablet 5  . docusate sodium (COLACE) 100 MG capsule Take 1 capsule (100 mg total) by mouth 2 (two) times daily. 10 capsule 0  . escitalopram (LEXAPRO) 10 MG tablet Take 1 tablet (10 mg total) by mouth daily. 30 tablet 2  . ipratropium (ATROVENT) 0.03 % nasal spray Place 2 sprays into both nostrils 3 (three) times daily as needed (nasal congestion). 30 mL 0  . methotrexate (RHEUMATREX) 2.5 MG tablet Take 20 mg by mouth once a week. Caution:Chemotherapy. Protect from light.    Marland Kitchen oxyCODONE-acetaminophen (PERCOCET) 10-325 MG tablet Take 1 tablet by mouth every 6 (six) hours as needed for pain. 90 tablet 0  . pantoprazole (PROTONIX) 40 MG tablet Take 1 tablet (40 mg total) by mouth daily. 90 tablet 1  . pregabalin (LYRICA) 50 MG capsule Take 1 capsule (50 mg total) by mouth daily. 90 capsule 2  . SUMAtriptan (IMITREX) 100 MG tablet Take 0.5 tablets (50 mg total) by mouth every 2 (two) hours as needed for migraine. May repeat in 2 hours if headache persists or recurs. 10 tablet 2  . traZODone (DESYREL) 100 MG tablet Take 1 tablet (100 mg total) by mouth at bedtime as needed for sleep. 30 tablet 1  . varenicline (CHANTIX CONTINUING MONTH PAK) 1 MG tablet Take 1 tablet (  1 mg total) by mouth 2 (two) times daily. 60 tablet 1  . varenicline (CHANTIX STARTING MONTH PAK) 0.5 MG X 11 & 1 MG X 42 tablet Take one 0.5 mg tablet by mouth once daily for 3 days, then increase to one 0.5 mg tablet twice daily for 4 days, then increase to one 1 mg tablet twice daily. 42 tablet 0  . vitamin C (ASCORBIC ACID) 500 MG tablet Take 1 tablet (500 mg total) by mouth daily. 50 tablet 0  . [DISCONTINUED] FLUoxetine (PROZAC) 40 MG capsule Take 1 capsule (40 mg total) by mouth 2 (two)  times daily. 180 capsule 0   No current facility-administered medications on file prior to visit.    Allergies  Allergen Reactions  . Benadryl [Diphenhydramine Hcl] Other (See Comments)    Causes severe anxiety  . Benzamide Derivatives Other (See Comments)    blisters  . Benzocaine Hives and Other (See Comments)    blisters  . Lisinopril Cough      Review of Systems:  Constitutional: No recent illness  Skin: No  Rash, +skin concern    Exam:  BP (!) 147/92   Pulse 100   Wt 240 lb (108.9 kg)   BMI 37.59 kg/m   Constitutional: VS see above. General Appearance: alert, well-developed, well-nourished, NAD  Skin: warm, dry, intact. Nodule on L buttock just adjacent to gluteal cleft w/o erythema or drainage but there is (+)induration superior to the nodule  Psychiatric: Normal judgment/insight. Normal mood and affect. Oriented x3.    PRE-OP DIAGNOSIS: Abnormal Skin Lesion POST-OP DIAGNOSIS: Same  PROCEDURE: skin biopsy Performing Physician: Sunnie Nielsen   PROCEDURE:  Shave Biopsy   The area surrounding the skin lesion was prepared and draped in the usual sterile manner. Area surrounding lesion was anesthetized with 9mL Xylocaine. The lesion was removed in the usual manner by the biopsy method noted above. Hemostasis was assured after some time holding pressure - Lido w/Epi has been on backorder. The patient tolerated the procedure well. Wife educated on wound care.    Closure: silver nitrate cautery for hemostasis     Followup: The patient tolerated the procedure well without complications.  Standard post-procedure care is explained and return precautions are given.    ASSESSMENT/PLAN:   Skin lesion - Plan: Dermatology pathology  Tobacco abuse - Plan: varenicline (CHANTIX STARTING MONTH PAK) 0.5 MG X 11 & 1 MG X 42 tablet, varenicline (CHANTIX CONTINUING MONTH PAK) 1 MG tablet    Patient Instructions  Skin Biopsy, Care After Refer to this sheet in the next  few weeks. These instructions provide you with information about caring for yourself after your procedure. Your health care provider may also give you more specific instructions. Your treatment has been planned according to current medical practices, but problems sometimes occur. Call your health care provider if you have any problems or questions after your procedure. What can I expect after the procedure? After the procedure, it is common to have:  Soreness.  Bruising.  Itching. Follow these instructions at home:  Rest and then return to your normal activities as told by your health care provider.  Take over-the-counter and prescription medicines only as told by your health care provider.  Follow instructions from your health care provider about how to take care of your biopsy site.Make sure you:  Wash your hands with soap and water before you change your bandage (dressing). If soap and water are not available, use hand sanitizer.  Change your dressing  as told by your health care provider.  Check your biopsy site every day for signs of infection. Check for:  More redness, swelling, or pain.  More fluid or blood.  Warmth.  Pus or a bad smell.  Keep all follow-up visits as told by your health care provider. This is important. Contact a health care provider if:  You have more redness, swelling, or pain around your biopsy site.  You have more fluid or blood coming from your biopsy site.  Your biopsy site feels warm to the touch.  You have pus or a bad smell coming from your biopsy site.  You have a fever. Get help right away if:  You have bleeding that does not stop with pressure or a dressing. This information is not intended to replace advice given to you by your health care provider. Make sure you discuss any questions you have with your health care provider. Document Released: 02/08/2015 Document Revised: 09/08/2015 Document Reviewed: 04/11/2014 Elsevier Interactive  Patient Education  2017 [employer].     Follow-up plan: Return for wound care if concern for infection, otherwise blood pressure followup in 3 months .  Visit summary with medication list and pertinent instructions was printed for patient to review, alert [place] if any changes needed. All questions at time of visit were answered - patient instructed to contact office with any additional concerns. ER/RTC precautions were reviewed with the patient and understanding verbalized.

## 2016-04-08 DIAGNOSIS — M069 Rheumatoid arthritis, unspecified: Secondary | ICD-10-CM | POA: Diagnosis not present

## 2016-04-08 DIAGNOSIS — Z79891 Long term (current) use of opiate analgesic: Secondary | ICD-10-CM | POA: Diagnosis not present

## 2016-04-08 DIAGNOSIS — M47812 Spondylosis without myelopathy or radiculopathy, cervical region: Secondary | ICD-10-CM | POA: Diagnosis not present

## 2016-04-08 DIAGNOSIS — G894 Chronic pain syndrome: Secondary | ICD-10-CM | POA: Diagnosis not present

## 2016-04-08 DIAGNOSIS — M47817 Spondylosis without myelopathy or radiculopathy, lumbosacral region: Secondary | ICD-10-CM | POA: Diagnosis not present

## 2016-05-12 DIAGNOSIS — M47812 Spondylosis without myelopathy or radiculopathy, cervical region: Secondary | ICD-10-CM | POA: Diagnosis not present

## 2016-05-12 DIAGNOSIS — G894 Chronic pain syndrome: Secondary | ICD-10-CM | POA: Diagnosis not present

## 2016-05-12 DIAGNOSIS — M47817 Spondylosis without myelopathy or radiculopathy, lumbosacral region: Secondary | ICD-10-CM | POA: Diagnosis not present

## 2016-05-12 DIAGNOSIS — M069 Rheumatoid arthritis, unspecified: Secondary | ICD-10-CM | POA: Diagnosis not present

## 2016-06-09 DIAGNOSIS — M47817 Spondylosis without myelopathy or radiculopathy, lumbosacral region: Secondary | ICD-10-CM | POA: Diagnosis not present

## 2016-06-09 DIAGNOSIS — G894 Chronic pain syndrome: Secondary | ICD-10-CM | POA: Diagnosis not present

## 2016-06-09 DIAGNOSIS — M47812 Spondylosis without myelopathy or radiculopathy, cervical region: Secondary | ICD-10-CM | POA: Diagnosis not present

## 2016-06-09 DIAGNOSIS — M069 Rheumatoid arthritis, unspecified: Secondary | ICD-10-CM | POA: Diagnosis not present

## 2016-06-23 ENCOUNTER — Ambulatory Visit (INDEPENDENT_AMBULATORY_CARE_PROVIDER_SITE_OTHER): Payer: Medicare Other | Admitting: Psychiatry

## 2016-06-23 ENCOUNTER — Encounter (HOSPITAL_COMMUNITY): Payer: Self-pay | Admitting: Psychiatry

## 2016-06-23 VITALS — BP 126/70 | HR 82 | Resp 16 | Ht 67.0 in | Wt 238.0 lb

## 2016-06-23 DIAGNOSIS — F4323 Adjustment disorder with mixed anxiety and depressed mood: Secondary | ICD-10-CM

## 2016-06-23 DIAGNOSIS — F39 Unspecified mood [affective] disorder: Secondary | ICD-10-CM

## 2016-06-23 DIAGNOSIS — G4700 Insomnia, unspecified: Secondary | ICD-10-CM | POA: Diagnosis not present

## 2016-06-23 DIAGNOSIS — Z811 Family history of alcohol abuse and dependence: Secondary | ICD-10-CM | POA: Diagnosis not present

## 2016-06-23 DIAGNOSIS — F331 Major depressive disorder, recurrent, moderate: Secondary | ICD-10-CM

## 2016-06-23 DIAGNOSIS — F411 Generalized anxiety disorder: Secondary | ICD-10-CM

## 2016-06-23 MED ORDER — ESCITALOPRAM OXALATE 10 MG PO TABS: 10.0000 mg | ORAL_TABLET | Freq: Every day | ORAL | 3 refills | Status: DC

## 2016-06-23 NOTE — Progress Notes (Signed)
Patient ID: [name], male   DOB: 24-Sep-1970, 46 y.o.   MRN: [id number] [facility] Follow-up Outpatient Visit  [name] [id number] 46 y.o.  06/23/2016  Chief Complaint: follow up for depression and anxiety.     History of Present Illness:     HPI Comments: [name] is a 46 y/o male with a past psychiatric history significant for symptoms of anxiety and depression. The patient was initially referred for psychiatric services for medication management.   Patient has had a wrist surgery he is adjusting to it he hasn't brought this on the left wrist. He is getting back with his fiance. Depression was stable Patient is doing reasonable regarding depression she is medication is helping he likes Lexapro does help with anxiety as well sleep is improved does not take trazodone on a regular basis somewhat worried about her daughter in law who has gone through appendix surgery. Relationship is well.  Depression is improving    Modifying factors: relationship. His disablity was approved 2016. Marland Kitchen Severity: Depression severity: improved  . Modifying factors: Improves with spending time with his children.      Past Medical History:  Diagnosis Date  . Anxiety   . Arthritis   . Asthma    history [id number]  . Depression   . Essential hypertension 12/11/2011  . Gout   . Headache   . History of hiatal hernia   . History of lithotripsy   . Hypertension   . Kidney stones   . Lumbar degenerative disc disease 12/11/2011  . Nephrolithiasis 12/11/2011  . Pericarditis   . Renal tubular acidosis   . Rheumatoid arthritis(714.0)    Family History  Problem Relation Age of Onset  . Hypertension Father   . Alcohol abuse Father   . Heart attack Father 75  . Diabetes Mellitus II Father   . Coronary artery disease Father   . Diabetes Mellitus II Mother        [name] into a diabetic coma, per pt.  . Cancer - Colon Mother   . Coronary artery disease Mother   .  Heart attack Mother 70  . Ovarian cancer Mother   . Uterine cancer Mother   . Heart attack Brother   . Coronary artery disease Brother   . Heart murmur Sister   . Dementia Neg Hx   . Bipolar disorder Neg Hx   . Depression Neg Hx   . Drug abuse Neg Hx   . OCD Neg Hx     Outpatient Encounter Prescriptions as of 06/23/2016  Medication Sig  . Adalimumab (HUMIRA PEN [place]) Inject into the skin.  Marland Kitchen AMBULATORY NON FORMULARY MEDICATION Medication Name: Gilmer Mor, use as needed.  Dx: Rheumatoid Arthritis  . amLODipine (NORVASC) 10 MG tablet One tablet by mouth every day for blood pressure control.  Marland Kitchen atorvastatin (LIPITOR) 40 MG tablet Take 1 tablet (40 mg total) by mouth daily.  . chlorthalidone (HYGROTON) 50 MG tablet Take 1 tablet (50 mg total) by mouth daily.  . cyclobenzaprine (FLEXERIL) 10 MG tablet Take 1 tablet (10 mg total) by mouth 3 (three) times daily as needed for muscle spasms.  Marland Kitchen docusate sodium (COLACE) 100 MG capsule Take 1 capsule (100 mg total) by mouth 2 (two) times daily.  Marland Kitchen escitalopram (LEXAPRO) 10 MG tablet Take 1 tablet (10 mg total) by mouth daily.  Marland Kitchen ipratropium (ATROVENT) 0.03 % nasal spray Place 2 sprays into both nostrils 3 (three) times daily as needed (nasal congestion).  Marland Kitchen  methotrexate (RHEUMATREX) 2.5 MG tablet Take 20 mg by mouth once a week. Caution:Chemotherapy. Protect from light.  Marland Kitchen oxyCODONE-acetaminophen (PERCOCET) 10-325 MG tablet Take 1 tablet by mouth every 6 (six) hours as needed for pain.  . pantoprazole (PROTONIX) 40 MG tablet Take 1 tablet (40 mg total) by mouth daily.  . pregabalin (LYRICA) 50 MG capsule Take 1 capsule (50 mg total) by mouth daily.  . SUMAtriptan (IMITREX) 100 MG tablet Take 0.5 tablets (50 mg total) by mouth every 2 (two) hours as needed for migraine. May repeat in 2 hours if headache persists or recurs.  . varenicline (CHANTIX CONTINUING MONTH PAK) 1 MG tablet Take 1 tablet (1 mg total) by mouth 2 (two) times daily.  . varenicline  (CHANTIX STARTING MONTH PAK) 0.5 MG X 11 & 1 MG X 42 tablet Take one 0.5 mg tablet by mouth once daily for 3 days, then increase to one 0.5 mg tablet twice daily for 4 days, then increase to one 1 mg tablet twice daily.  . vitamin C (ASCORBIC ACID) 500 MG tablet Take 1 tablet (500 mg total) by mouth daily.  . [DISCONTINUED] escitalopram (LEXAPRO) 10 MG tablet Take 1 tablet (10 mg total) by mouth daily.  . [DISCONTINUED] traZODone (DESYREL) 100 MG tablet Take 1 tablet (100 mg total) by mouth at bedtime as needed for sleep.   No facility-administered encounter medications on file as of 06/23/2016.     No results found for this or any previous visit (from the past 2160 hour(s)).  BP 126/70 (BP Location: Right Arm, Patient Position: Sitting, Cuff Size: Normal)   Pulse 82   Resp 16   Ht 5\' 7"  (1.702 m)   Wt 238 lb (108 kg)   SpO2 94%   BMI 37.28 kg/m    Review of Systems  Constitutional: Negative for fever.  Cardiovascular: Negative for chest pain.  Skin: Negative for rash.  Neurological: Negative for headaches.  Psychiatric/Behavioral: Negative for depression and suicidal ideas.    Mental Status Examination  Appearance: casual and cooperative Alert: Yes Attention: fair  Cooperative: Yes Eye Contact: Good Speech: coherent Psychomotor Activity: Normal Memory/Concentration: adequate Oriented: person, place, time/date and situation Mood: euthymic, reactive Affect: reactive Thought Processes and Associations: Coherent Fund of Knowledge: Fair Thought Content: Suicidal ideation and Homicidal ideation were denied. Denies hallucinations Insight: Fair Judgement: Fair  Diagnosis: Major depressive disorder recurrent moderate to severe. Generalized anxiety disorder. Insomnia. 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 Continue lexapro.  Insomnia: improving. Not taking trazadone regularly. Will not need refill   Medical  complexity: mood can be effected by pain.  Provided supportive therapy Relationship is good FU 4 months. Renewed meds   , MD

## 2016-06-29 ENCOUNTER — Ambulatory Visit: Payer: Self-pay | Admitting: Osteopathic Medicine

## 2016-07-09 ENCOUNTER — Ambulatory Visit (INDEPENDENT_AMBULATORY_CARE_PROVIDER_SITE_OTHER): Payer: Medicare Other | Admitting: Osteopathic Medicine

## 2016-07-09 ENCOUNTER — Encounter: Payer: Self-pay | Admitting: Osteopathic Medicine

## 2016-07-09 VITALS — BP 155/95 | HR 70 | Wt 239.0 lb

## 2016-07-09 DIAGNOSIS — G894 Chronic pain syndrome: Secondary | ICD-10-CM | POA: Diagnosis not present

## 2016-07-09 DIAGNOSIS — M47812 Spondylosis without myelopathy or radiculopathy, cervical region: Secondary | ICD-10-CM | POA: Diagnosis not present

## 2016-07-09 DIAGNOSIS — M47817 Spondylosis without myelopathy or radiculopathy, lumbosacral region: Secondary | ICD-10-CM | POA: Diagnosis not present

## 2016-07-09 DIAGNOSIS — M069 Rheumatoid arthritis, unspecified: Secondary | ICD-10-CM | POA: Diagnosis not present

## 2016-07-09 DIAGNOSIS — I10 Essential (primary) hypertension: Secondary | ICD-10-CM

## 2016-07-09 DIAGNOSIS — J069 Acute upper respiratory infection, unspecified: Secondary | ICD-10-CM | POA: Diagnosis not present

## 2016-07-09 DIAGNOSIS — L723 Sebaceous cyst: Secondary | ICD-10-CM

## 2016-07-09 DIAGNOSIS — B9789 Other viral agents as the cause of diseases classified elsewhere: Secondary | ICD-10-CM

## 2016-07-09 MED ORDER — GUAIFENESIN 200 MG PO TABS: 200.0000 mg | ORAL_TABLET | ORAL | 0 refills | Status: DC | PRN

## 2016-07-09 MED ORDER — IPRATROPIUM BROMIDE 0.06 % NA SOLN: 2.0000 | Freq: Four times a day (QID) | NASAL | 12 refills | Status: DC

## 2016-07-09 MED ORDER — PANTOPRAZOLE SODIUM 40 MG PO TBEC: 40.0000 mg | DELAYED_RELEASE_TABLET | Freq: Every day | ORAL | 3 refills | Status: DC

## 2016-07-09 MED ORDER — CYCLOBENZAPRINE HCL 10 MG PO TABS: 10.0000 mg | ORAL_TABLET | Freq: Three times a day (TID) | ORAL | 5 refills | Status: DC | PRN

## 2016-07-09 MED ORDER — CHLORTHALIDONE 50 MG PO TABS: 50.0000 mg | ORAL_TABLET | Freq: Every day | ORAL | 1 refills | Status: DC

## 2016-07-09 NOTE — Patient Instructions (Signed)
Over-the-Counter Medications & Home Remedies for Upper Respiratory Illness  Note: the following list assumes no pregnancy, normal liver & kidney function and no other drug interactions. Dr. Lyn Hollingshead has highlighted medications which are safe for you to use, but these may not be appropriate for everyone. Always ask a pharmacist or qualified medical provider if you have any questions!   Aches/Pains, Fever, Headache Acetaminophen (Tylenol) 500 mg tablets - take max 2 tablets (1000 mg) every 6 hours (4 times per day)  Ibuprofen (Motrin) 200 mg tablets - take max 4 tablets (800 mg) every 6 hours*  Sinus Congestion Prescription Atrovent as directed Cromolyn Nasal Spray (NasalCrom) 1 spray each nostril 3-4 times per day, max 6 imes per day Nasal Saline if desired Oxymetolazone (Afrin, others) sparing use due to rebound congestion, NEVER use in kids Phenylephrine (Sudafed) 10 mg tablets every 4 hours (or the 12-hour formulation)* Diphenhydramine (Benadryl) 25 mg tablets - take max 2 tablets every 4 hours  Cough & Sore Throat Prescription cough pills or syrups as directed Dextromethorphan (Robitussin, others) - cough suppressant Guaifenesin (Robitussin, Mucinex, others) - expectorant (helps cough up mucus) (Dextromethorphan and Guaifenesin also come in a combination tablet) Lozenges w/ Benzocaine + Menthol (Cepacol) Honey - as much as you want! Teas which "coat the throat" - look for ingredients Elm Bark, Licorice Root, Marshmallow Root  Other Zinc Lozenges within 24 hours of symptoms onset - mixed evidence this shortens the duration of the common cold Don't waste your money on Vitamin C or Echinacea  *Caution in patients with high blood pressure

## 2016-07-09 NOTE — Progress Notes (Signed)
HPI: [name] is a 46 y.o. male  who presents to [facility] [place] today, 07/09/16,  for chief complaint of:  Chief Complaint  Patient presents with  . Follow-up  . Hypertension    recheck  . URI    wants something for cold  . Cyst    on rear is back    Hypertension: Blood pressure bit on the high side today, patient has not been taking the chlorthalidone, though this is on his medication list. He has been taking the amlodipine. No chest pain, pressure, short of breath.  Chronic pain/arthritis: Requests refill of muscle relaxers, takes these as needed for muscle aches/pains.  History of sebaceous cyst: Previously removed by myself but has recurred, patient would like referral to have it removed by surgeon  Cold: Patient with examining for cold symptoms, but having some sinus congestion for the past few days, mild sore throat and cough.  Past medical history, surgical history, social history and family history reviewed.  Patient Active Problem List   Diagnosis Date Noted  . Migraine 03/08/2016  . History of obstructive sleep apnea 01/17/2016  . Arthritis of left wrist 11/06/2015  . Left hand pain 08/27/2015  . Rectal bleeding 05/11/2013  . Hyperglycemia 11/02/2012  . Tinnitus 11/02/2012  . Major depressive disorder, single episode, severe, without mention of psychotic behavior 08/31/2012  . Infected sebaceous cyst of skin 04/19/2012  . Hypercalcemia 03/16/2012  . Tobacco use 03/04/2012  . Restless leg 02/12/2012  . Degenerative disc disease, cervical 12/28/2011  . Hyperlipidemia LDL goal <130 12/21/2011  . Rheumatoid arthritis (HCC) 12/17/2011  . Essential hypertension 12/11/2011  . Lumbar degenerative disc disease 12/11/2011  . Nephrolithiasis 12/11/2011  . Chronic pain of multiple joints 12/11/2011  . Right shoulder pain 12/11/2011  . Left shoulder pain 12/11/2011    Current medication list and allergy/intolerance information  reviewed.   Current Outpatient Prescriptions on File Prior to Visit  Medication Sig Dispense Refill  . Adalimumab (HUMIRA PEN [place]) Inject into the skin.    Marland Kitchen AMBULATORY NON FORMULARY MEDICATION Medication Name: Gilmer Mor, use as needed.  Dx: Rheumatoid Arthritis 1 Units 0  . amLODipine (NORVASC) 10 MG tablet One tablet by mouth every day for blood pressure control. 90 tablet 2  . atorvastatin (LIPITOR) 40 MG tablet Take 1 tablet (40 mg total) by mouth daily. 90 tablet 3  . chlorthalidone (HYGROTON) 50 MG tablet Take 1 tablet (50 mg total) by mouth daily. 90 tablet 1  . cyclobenzaprine (FLEXERIL) 10 MG tablet Take 1 tablet (10 mg total) by mouth 3 (three) times daily as needed for muscle spasms. 90 tablet 5  . docusate sodium (COLACE) 100 MG capsule Take 1 capsule (100 mg total) by mouth 2 (two) times daily. 10 capsule 0  . escitalopram (LEXAPRO) 10 MG tablet Take 1 tablet (10 mg total) by mouth daily. 30 tablet 3  . ipratropium (ATROVENT) 0.03 % nasal spray Place 2 sprays into both nostrils 3 (three) times daily as needed (nasal congestion). 30 mL 0  . methotrexate (RHEUMATREX) 2.5 MG tablet Take 20 mg by mouth once a week. Caution:Chemotherapy. Protect from light.    Marland Kitchen oxyCODONE-acetaminophen (PERCOCET) 10-325 MG tablet Take 1 tablet by mouth every 6 (six) hours as needed for pain. 90 tablet 0  . pantoprazole (PROTONIX) 40 MG tablet Take 1 tablet (40 mg total) by mouth daily. 90 tablet 1  . pregabalin (LYRICA) 50 MG capsule Take 1 capsule (50 mg total) by  mouth daily. 90 capsule 2  . SUMAtriptan (IMITREX) 100 MG tablet Take 0.5 tablets (50 mg total) by mouth every 2 (two) hours as needed for migraine. May repeat in 2 hours if headache persists or recurs. 10 tablet 2  . varenicline (CHANTIX CONTINUING MONTH PAK) 1 MG tablet Take 1 tablet (1 mg total) by mouth 2 (two) times daily. 60 tablet 1  . vitamin C (ASCORBIC ACID) 500 MG tablet Take 1 tablet (500 mg total) by mouth daily. 50 tablet 0  .  [DISCONTINUED] FLUoxetine (PROZAC) 40 MG capsule Take 1 capsule (40 mg total) by mouth 2 (two) times daily. 180 capsule 0  . [DISCONTINUED] traZODone (DESYREL) 100 MG tablet Take 1 tablet (100 mg total) by mouth at bedtime as needed for sleep. 30 tablet 1   No current facility-administered medications on file prior to visit.    Allergies  Allergen Reactions  . Benadryl [Diphenhydramine Hcl] Other (See Comments)    Causes severe anxiety  . Benzamide Derivatives Other (See Comments)    blisters  . Benzocaine Hives and Other (See Comments)    blisters  . Lisinopril Cough      Review of Systems:  Constitutional: +recent illness  HEENT: No  headache, no vision change, + sinus congestion  Cardiac: No  chest pain, No  pressure, No palpitations  Respiratory:  No  shortness of breath. +Cough  Musculoskeletal: No new myalgia/arthralgia  Skin: No  Rash, patient just gluteal cleft as per history of present illness  Hem/Onc: No  easy bruising/bleeding, No  abnormal lumps/bumps  Neurologic: No  weakness, No  Dizziness  Psychiatric: No  concerns with depression, No  concerns with anxiety  Exam:  BP (!) 155/95   Pulse 70   Wt 239 lb (108.4 kg)   BMI 37.43 kg/m   Constitutional: VS see above. General Appearance: alert, well-developed, well-nourished, NAD  Eyes: Normal lids and conjunctive, non-icteric sclera  Ears, Nose, Mouth, Throat: MMM, Normal external inspection ears/nares/mouth/lips/gums.  Neck: No masses, trachea midline.   Respiratory: Normal respiratory effort. no wheeze, no rhonchi, no rales  Cardiovascular: S1/S2 normal, no murmur, no rub/gallop auscultated. RRR.   Musculoskeletal: Gait normal. Symmetric and independent movement of all extremities  Neurological: Normal balance/coordination. No tremor.  Skin: warm, dry, intact.   Psychiatric: Normal judgment/insight. Normal mood and affect. Oriented x3.      ASSESSMENT/PLAN:   Essential hypertension -  Restart chlorthalidone, follow-up in 2 weeks for nurse visit blood pressure recheck - Plan: chlorthalidone (HYGROTON) 50 MG tablet  Chronic pain syndrome - Plan: cyclobenzaprine (FLEXERIL) 10 MG tablet  Sebaceous cyst - Referral to Gen. surgery, not sure if any involvement of possible pilonidal issue given location - Plan: Ambulatory referral to General Surgery  Viral URI with cough - Supportive care advised, see me if no better - Plan: ipratropium (ATROVENT) 0.06 % nasal spray, guaiFENesin 200 MG tablet    Patient Instructions  Over-the-Counter Medications & Home Remedies for Upper Respiratory Illness  Note: the following list assumes no pregnancy, normal liver & kidney function and no other drug interactions. Dr. Lyn Hollingshead has highlighted medications which are safe for you to use, but these may not be appropriate for everyone. Always ask a pharmacist or qualified medical provider if you have any questions!   Aches/Pains, Fever, Headache Acetaminophen (Tylenol) 500 mg tablets - take max 2 tablets (1000 mg) every 6 hours (4 times per day)  Ibuprofen (Motrin) 200 mg tablets - take max 4 tablets (800 mg) every  6 hours*  Sinus Congestion Prescription Atrovent as directed Cromolyn Nasal Spray (NasalCrom) 1 spray each nostril 3-4 times per day, max 6 imes per day Nasal Saline if desired Oxymetolazone (Afrin, others) sparing use due to rebound congestion, NEVER use in kids Phenylephrine (Sudafed) 10 mg tablets every 4 hours (or the 12-hour formulation)* Diphenhydramine (Benadryl) 25 mg tablets - take max 2 tablets every 4 hours  Cough & Sore Throat Prescription cough pills or syrups as directed Dextromethorphan (Robitussin, others) - cough suppressant Guaifenesin (Robitussin, Mucinex, others) - expectorant (helps cough up mucus) (Dextromethorphan and Guaifenesin also come in a combination tablet) Lozenges w/ Benzocaine + Menthol (Cepacol) Honey - as much as you want! Teas which "coat the  throat" - look for ingredients Elm Bark, Licorice Root, Marshmallow Root  Other Zinc Lozenges within 24 hours of symptoms onset - mixed evidence this shortens the duration of the common cold Don't waste your money on Vitamin C or Echinacea  *Caution in patients with high blood pressure     Follow-up plan: Return in about 2 weeks (around 07/23/2016) for nurse visit recheck BP, and the see me for Annual wellness physical in the winter .  Visit summary with medication list and pertinent instructions was printed for patient to review, alert [place] if any changes needed. All questions at time of visit were answered - patient instructed to contact office with any additional concerns. ER/RTC precautions were reviewed with the patient and understanding verbalized.

## 2016-07-23 ENCOUNTER — Ambulatory Visit: Payer: Self-pay

## 2016-09-07 DIAGNOSIS — M47812 Spondylosis without myelopathy or radiculopathy, cervical region: Secondary | ICD-10-CM | POA: Diagnosis not present

## 2016-09-07 DIAGNOSIS — G894 Chronic pain syndrome: Secondary | ICD-10-CM | POA: Diagnosis not present

## 2016-09-07 DIAGNOSIS — M47817 Spondylosis without myelopathy or radiculopathy, lumbosacral region: Secondary | ICD-10-CM | POA: Diagnosis not present

## 2016-09-07 DIAGNOSIS — M069 Rheumatoid arthritis, unspecified: Secondary | ICD-10-CM | POA: Diagnosis not present

## 2016-10-06 DIAGNOSIS — M069 Rheumatoid arthritis, unspecified: Secondary | ICD-10-CM | POA: Diagnosis not present

## 2016-10-06 DIAGNOSIS — M47817 Spondylosis without myelopathy or radiculopathy, lumbosacral region: Secondary | ICD-10-CM | POA: Diagnosis not present

## 2016-10-06 DIAGNOSIS — G894 Chronic pain syndrome: Secondary | ICD-10-CM | POA: Diagnosis not present

## 2016-10-06 DIAGNOSIS — M47812 Spondylosis without myelopathy or radiculopathy, cervical region: Secondary | ICD-10-CM | POA: Diagnosis not present

## 2016-10-14 ENCOUNTER — Ambulatory Visit (INDEPENDENT_AMBULATORY_CARE_PROVIDER_SITE_OTHER): Payer: Medicare Other | Admitting: Psychiatry

## 2016-10-14 ENCOUNTER — Encounter (HOSPITAL_COMMUNITY): Payer: Self-pay | Admitting: Psychiatry

## 2016-10-14 VITALS — BP 126/72 | HR 72 | Resp 16 | Ht 67.0 in | Wt 233.0 lb

## 2016-10-14 DIAGNOSIS — G4700 Insomnia, unspecified: Secondary | ICD-10-CM | POA: Diagnosis not present

## 2016-10-14 DIAGNOSIS — F4323 Adjustment disorder with mixed anxiety and depressed mood: Secondary | ICD-10-CM

## 2016-10-14 DIAGNOSIS — M2550 Pain in unspecified joint: Secondary | ICD-10-CM

## 2016-10-14 DIAGNOSIS — Z811 Family history of alcohol abuse and dependence: Secondary | ICD-10-CM

## 2016-10-14 DIAGNOSIS — F411 Generalized anxiety disorder: Secondary | ICD-10-CM

## 2016-10-14 DIAGNOSIS — F331 Major depressive disorder, recurrent, moderate: Secondary | ICD-10-CM

## 2016-10-14 MED ORDER — ESCITALOPRAM OXALATE 10 MG PO TABS: 10.0000 mg | ORAL_TABLET | Freq: Every day | ORAL | 3 refills | Status: DC

## 2016-10-14 NOTE — Progress Notes (Signed)
Patient ID: [name], male   DOB: Aug 15, 1970, 46 y.o.   MRN: [id number] [facility] Follow-up Outpatient Visit  [name] [id number] 46 y.o.  10/14/2016  Chief Complaint: follow up for depression and anxiety.     History of Present Illness:     HPI Comments: [name] is a 46 y/o male with a past psychiatric history significant for symptoms of anxiety and depression. The patient was initially referred for psychiatric services for medication management.   Patient continues to do reasonable with the medication on board Lexapro helps her depression and anxiety tries a result that he has arthritis and is on pain medication recent surgery on the wrist rather changes and other factors can affect his pain. He is now married depression wise not worse  Sleep is not worse without the trazodone.   Depression is improving    Modifying factors: relationship. children His disablity was approved 2016. Marland Kitchen Severity: Depression severity: 8/10. 10 being no depression  No psychosis      Past Medical History:  Diagnosis Date  . Anxiety   . Arthritis   . Asthma    history [id number]  . Depression   . Essential hypertension 12/11/2011  . Gout   . Headache   . History of hiatal hernia   . History of lithotripsy   . Hypertension   . Kidney stones   . Lumbar degenerative disc disease 12/11/2011  . Nephrolithiasis 12/11/2011  . Pericarditis   . Renal tubular acidosis   . Rheumatoid arthritis(714.0)    Family History  Problem Relation Age of Onset  . Hypertension Father   . Alcohol abuse Father   . Heart attack Father 42  . Diabetes Mellitus II Father   . Coronary artery disease Father   . Diabetes Mellitus II Mother        [name] into a diabetic coma, per pt.  . Cancer - Colon Mother   . Coronary artery disease Mother   . Heart attack Mother 39  . Ovarian cancer Mother   . Uterine cancer Mother   . Heart attack Brother   . Coronary artery disease  Brother   . Heart murmur Sister   . Dementia Neg Hx   . Bipolar disorder Neg Hx   . Depression Neg Hx   . Drug abuse Neg Hx   . OCD Neg Hx     Outpatient Encounter Prescriptions as of 10/14/2016  Medication Sig  . Adalimumab (HUMIRA PEN [place]) Inject into the skin.  Marland Kitchen AMBULATORY NON FORMULARY MEDICATION Medication Name: Gilmer Mor, use as needed.  Dx: Rheumatoid Arthritis  . amLODipine (NORVASC) 10 MG tablet One tablet by mouth every day for blood pressure control.  Marland Kitchen atorvastatin (LIPITOR) 40 MG tablet Take 1 tablet (40 mg total) by mouth daily.  . cyclobenzaprine (FLEXERIL) 10 MG tablet Take 1 tablet (10 mg total) by mouth 3 (three) times daily as needed for muscle spasms.  Marland Kitchen docusate sodium (COLACE) 100 MG capsule Take 1 capsule (100 mg total) by mouth 2 (two) times daily.  Marland Kitchen escitalopram (LEXAPRO) 10 MG tablet Take 1 tablet (10 mg total) by mouth daily.  Marland Kitchen guaiFENesin 200 MG tablet Take 1 tablet (200 mg total) by mouth every 4 (four) hours as needed for cough or to loosen phlegm.  Marland Kitchen ipratropium (ATROVENT) 0.06 % nasal spray Place 2 sprays into both nostrils 4 (four) times daily.  . methotrexate (RHEUMATREX) 2.5 MG tablet Take 20 mg by mouth once a week.  Caution:Chemotherapy. Protect from light.  Marland Kitchen oxyCODONE-acetaminophen (PERCOCET) 10-325 MG tablet Take 1 tablet by mouth every 6 (six) hours as needed for pain.  . pantoprazole (PROTONIX) 40 MG tablet Take 1 tablet (40 mg total) by mouth daily.  . pregabalin (LYRICA) 50 MG capsule Take 1 capsule (50 mg total) by mouth daily.  . SUMAtriptan (IMITREX) 100 MG tablet Take 0.5 tablets (50 mg total) by mouth every 2 (two) hours as needed for migraine. May repeat in 2 hours if headache persists or recurs.  . varenicline (CHANTIX CONTINUING MONTH PAK) 1 MG tablet Take 1 tablet (1 mg total) by mouth 2 (two) times daily.  . vitamin C (ASCORBIC ACID) 500 MG tablet Take 1 tablet (500 mg total) by mouth daily.  . [DISCONTINUED] escitalopram (LEXAPRO) 10 MG  tablet Take 1 tablet (10 mg total) by mouth daily.  . chlorthalidone (HYGROTON) 50 MG tablet Take 1 tablet (50 mg total) by mouth daily. (Patient not taking: Reported on 07/09/2016)   No facility-administered encounter medications on file as of 10/14/2016.     No results found for this or any previous visit (from the past 2160 hour(s)).  BP 126/72 (BP Location: Right Arm, Patient Position: Sitting, Cuff Size: Normal)   Pulse 72   Resp 16   Ht 5\' 7"  (1.702 m)   Wt 233 lb (105.7 kg)   SpO2 94%   BMI 36.49 kg/m    Review of Systems  Constitutional: Negative for fever.  Cardiovascular: Negative for chest pain.  Musculoskeletal: Positive for joint pain.  Skin: Negative for rash.  Neurological: Negative for headaches.  Psychiatric/Behavioral: Negative for depression and suicidal ideas.    Mental Status Examination  Appearance: casual and cooperative Alert: Yes Attention: fair  Cooperative: Yes Eye Contact: Good Speech: coherent Psychomotor Activity: Normal Memory/Concentration: adequate Oriented: person, place, time/date and situation Mood: euthymic Affect: congruent Thought Processes and Associations: Coherent Fund of Knowledge: Fair Thought Content: Suicidal ideation and Homicidal ideation were denied. Denies hallucinations Insight: Fair Judgement: Fair  Diagnosis: Major depressive disorder recurrent moderate to severe. Generalized anxiety disorder. Insomnia. 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 Continue lexapro Insomnia:baseline. Reviewed sleep hygiene. Seldom takes trazadone   Medical complexity: pain can effect mood. On pain meds. Reviewed concerns , provided  suportive therapy. Prescriptions renewed. no side effects follow-up in 4 months   , Renelda Kilian, MD

## 2016-11-04 DIAGNOSIS — G894 Chronic pain syndrome: Secondary | ICD-10-CM | POA: Diagnosis not present

## 2016-11-04 DIAGNOSIS — M47812 Spondylosis without myelopathy or radiculopathy, cervical region: Secondary | ICD-10-CM | POA: Diagnosis not present

## 2016-11-04 DIAGNOSIS — F329 Major depressive disorder, single episode, unspecified: Secondary | ICD-10-CM | POA: Diagnosis not present

## 2016-11-04 DIAGNOSIS — G4733 Obstructive sleep apnea (adult) (pediatric): Secondary | ICD-10-CM | POA: Diagnosis not present

## 2016-11-04 DIAGNOSIS — M47817 Spondylosis without myelopathy or radiculopathy, lumbosacral region: Secondary | ICD-10-CM | POA: Diagnosis not present

## 2016-11-04 DIAGNOSIS — M069 Rheumatoid arthritis, unspecified: Secondary | ICD-10-CM | POA: Diagnosis not present

## 2016-11-04 DIAGNOSIS — G4700 Insomnia, unspecified: Secondary | ICD-10-CM | POA: Diagnosis not present

## 2016-11-04 DIAGNOSIS — Z79891 Long term (current) use of opiate analgesic: Secondary | ICD-10-CM | POA: Diagnosis not present

## 2016-12-09 DIAGNOSIS — M47817 Spondylosis without myelopathy or radiculopathy, lumbosacral region: Secondary | ICD-10-CM | POA: Diagnosis not present

## 2016-12-09 DIAGNOSIS — M069 Rheumatoid arthritis, unspecified: Secondary | ICD-10-CM | POA: Diagnosis not present

## 2016-12-09 DIAGNOSIS — M47812 Spondylosis without myelopathy or radiculopathy, cervical region: Secondary | ICD-10-CM | POA: Diagnosis not present

## 2016-12-09 DIAGNOSIS — G894 Chronic pain syndrome: Secondary | ICD-10-CM | POA: Diagnosis not present

## 2016-12-30 ENCOUNTER — Encounter: Payer: Self-pay | Admitting: Osteopathic Medicine

## 2016-12-30 ENCOUNTER — Ambulatory Visit (INDEPENDENT_AMBULATORY_CARE_PROVIDER_SITE_OTHER): Payer: Medicare Other | Admitting: Osteopathic Medicine

## 2016-12-30 VITALS — BP 126/89 | HR 69 | Temp 97.7°F | Resp 16 | Ht 68.0 in | Wt 231.7 lb

## 2016-12-30 DIAGNOSIS — R110 Nausea: Secondary | ICD-10-CM | POA: Diagnosis not present

## 2016-12-30 DIAGNOSIS — Z720 Tobacco use: Secondary | ICD-10-CM

## 2016-12-30 DIAGNOSIS — Z23 Encounter for immunization: Secondary | ICD-10-CM

## 2016-12-30 DIAGNOSIS — R635 Abnormal weight gain: Secondary | ICD-10-CM

## 2016-12-30 DIAGNOSIS — G2581 Restless legs syndrome: Secondary | ICD-10-CM

## 2016-12-30 DIAGNOSIS — I10 Essential (primary) hypertension: Secondary | ICD-10-CM

## 2016-12-30 DIAGNOSIS — R0602 Shortness of breath: Secondary | ICD-10-CM

## 2016-12-30 DIAGNOSIS — G43809 Other migraine, not intractable, without status migrainosus: Secondary | ICD-10-CM | POA: Diagnosis not present

## 2016-12-30 DIAGNOSIS — G894 Chronic pain syndrome: Secondary | ICD-10-CM

## 2016-12-30 MED ORDER — SUMATRIPTAN SUCCINATE 100 MG PO TABS: 50.0000 mg | ORAL_TABLET | ORAL | 3 refills | Status: DC | PRN

## 2016-12-30 MED ORDER — AMLODIPINE BESYLATE 10 MG PO TABS: ORAL_TABLET | ORAL | 3 refills | Status: DC

## 2016-12-30 MED ORDER — CYCLOBENZAPRINE HCL 10 MG PO TABS: 10.0000 mg | ORAL_TABLET | Freq: Three times a day (TID) | ORAL | 5 refills | Status: DC | PRN

## 2016-12-30 MED ORDER — ALBUTEROL SULFATE HFA 108 (90 BASE) MCG/ACT IN AERS: 1.0000 | INHALATION_SPRAY | RESPIRATORY_TRACT | 3 refills | Status: DC | PRN

## 2016-12-30 MED ORDER — CHLORTHALIDONE 50 MG PO TABS: 50.0000 mg | ORAL_TABLET | Freq: Every day | ORAL | 3 refills | Status: DC

## 2016-12-30 MED ORDER — FLUTICASONE FUROATE-VILANTEROL 200-25 MCG/INH IN AEPB: 1.0000 | INHALATION_SPRAY | Freq: Every day | RESPIRATORY_TRACT | 3 refills | Status: DC

## 2016-12-30 MED ORDER — PROMETHAZINE HCL 25 MG PO TABS: 25.0000 mg | ORAL_TABLET | Freq: Four times a day (QID) | ORAL | 3 refills | Status: DC | PRN

## 2016-12-30 MED ORDER — PREGABALIN 50 MG PO CAPS: 50.0000 mg | ORAL_CAPSULE | Freq: Every day | ORAL | 3 refills | Status: DC

## 2016-12-30 NOTE — Patient Instructions (Signed)
Plan:  For the breathing, we are starting a medication similar to Advair, this medicine is called Breo. The device is a little bit different, but the medication is essentially the same. We are also sending in a rescue inhaler for you to use as needed. If these inhalers are working well for you, nothing else really to do. He would like to schedule a lung function test, please call the office and let them know he needs to be scheduled for this procedure. Please stay off of your inhalers for about 48 hours prior to the test.  If dizziness persists, may want to try coming off of the Lyrica, or discussing it with pain management that you are having this side effect with the morphine patch. I sent refills of the Phenergan to use as needed.  See below for diet/exercise advice. If you would like to see a nutritionist, I am happy to put in this referral.      Weight loss: important things to remember  It is hard work! You will have setbacks, but don't get discouraged. The goal is not short-term success, it is long-term health.   Looking at the numbers is important to track your progress and set goals, but how you are feeling and your overall health are the most important things! BMI and pounds and calories and miles logged aren't everything - they are tools to help [place] reach your goals.  You can do this!!!   Things to remember for exercise for weight loss:   Please note - I am not a certified personal trainer. I can present you with ideas and general workout goals, but an exercise program is largely up to you. Find something you can stick with, and something you enjoy!   As you progress in your exercise regimen think about gradually increasing the following, week by week:   intensity (how strenuous is your workout)  frequency (how often you are exercising)  duration (how many minutes at a time you are exercising)  Walking for 20 minutes a day is certainly better than nothing, but more strenuous  exercise will develop better cardiovascular fitness.   interval training (high-intensity alternating with low-intensity, think walk/jog rather than just walk)  muscle strengthening exercises (weight lifting, calisthenics, yoga) - this also helps prevent osteoporosis!   Things to remember for diet changes for weight loss:   Please note - I am not a certified dietician. I can present you with ideas and general diet goals, but a meal plan is largely up to you. I am happy to refer you to a dietician who can give you a detailed meal plan.  Apps/logs are crucial to track how you're eating! It's not realistic to be logging everything you eat forever, but when you're starting a healthy eating lifestyle it's very helpful, and checking in with logs now and then helps you stick to your program!   Calorie restriction with the goal weight loss of no more than one to one and a half pounds per week.   Increase lean protein such as chicken, fish, [place].   Decrease fatty foods such as dairy, butter.   Decrease sugary foods. Avoid sugary drinks such as soda or juice.  Increase fiber found in fruit and vegetables.

## 2016-12-30 NOTE — Progress Notes (Signed)
HPI: [name] is a 46 y.o. male who  has a past medical history of Anxiety, Arthritis, Asthma, Depression, Essential hypertension (12/11/2011), Gout, Headache, History of hiatal hernia, History of lithotripsy, Hypertension, Kidney stones, Lumbar degenerative disc disease (12/11/2011), Nephrolithiasis (12/11/2011), Pericarditis, Renal tubular acidosis, and Rheumatoid arthritis(714.0).  he presents to [facility] today, 12/30/16,  for chief complaint of:  Chief Complaint  Patient presents with  . Medication Refill  . Dizziness    started a few days ago     Dizziness a few days ago after starting morphine patch, occasionally gets lightheaded/nauseous. Requests refill on promethazine. No falls/loss of consciousness, no vision issues  Chronic SOB/cough: Previously on Advair which was helpful, also was on rescue inhaler which was helpful. Would like to get back on inhaler medications. Is still smoking, was unable to tolerate Chantix.  Migraine: Stable, needs refill on medications.  Hypertension: Stable, needs refill on medications. No chest pain, pressure, headache.   Patient is accompanied by wife who assists with history-taking.   Past medical, surgical, social and family history reviewed:  Patient Active Problem List   Diagnosis Date Noted  . Migraine 03/08/2016  . History of obstructive sleep apnea 01/17/2016  . Arthritis of left wrist 11/06/2015  . Left hand pain 08/27/2015  . Rectal bleeding 05/11/2013  . Hyperglycemia 11/02/2012  . Tinnitus 11/02/2012  . Major depressive disorder, single episode, severe, without mention of psychotic behavior 08/31/2012  . Infected sebaceous cyst of skin 04/19/2012  . Hypercalcemia 03/16/2012  . Tobacco use 03/04/2012  . Restless leg 02/12/2012  . Degenerative disc disease, cervical 12/28/2011  . Hyperlipidemia LDL goal <130 12/21/2011  . Rheumatoid arthritis (HCC) 12/17/2011  . Essential hypertension  12/11/2011  . Lumbar degenerative disc disease 12/11/2011  . Nephrolithiasis 12/11/2011  . Chronic pain of multiple joints 12/11/2011  . Right shoulder pain 12/11/2011  . Left shoulder pain 12/11/2011    Past Surgical History:  Procedure Laterality Date  . ANTERIOR CERVICAL DECOMP/DISCECTOMY FUSION    . CARPAL TUNNEL RELEASE Left 11/06/2015   Procedure: LEFT WRIST CARPAL TUNNEL RELEASE;  Surgeon: Bradly Bienenstock, MD;  Location: [facility] OR;  Service: Orthopedics;  Laterality: Left;  . HEMORROIDECTOMY    . KIDNEY STONE SURGERY    . WRIST FUSION Left 11/06/2015   Procedure: ARTHRODESIS LEFT WRIST;  Surgeon: Bradly Bienenstock, MD;  Location: [facility] OR;  Service: Orthopedics;  Laterality: Left;    Social History   Tobacco Use  . Smoking status: Current Every Day Smoker    Packs/day: 0.25    Years: 30.00    Pack years: 7.50    Types: Cigarettes  . Smokeless tobacco: Former Neurosurgeon    Types: Chew    Quit date: 25  . Tobacco comment: Rx written for Chantix from Dr. Lyn Hollingshead.  Substance Use Topics  . Alcohol use: No    Family History  Problem Relation Age of Onset  . Hypertension Father   . Alcohol abuse Father   . Heart attack Father 34  . Diabetes Mellitus II Father   . Coronary artery disease Father   . Diabetes Mellitus II Mother        Micah Flesher into a diabetic coma, per pt.  . Cancer - Colon Mother   . Coronary artery disease Mother   . Heart attack Mother 86  . Ovarian cancer Mother   . Uterine cancer Mother   . Heart attack Brother   . Coronary artery disease Brother   .  Heart murmur Sister   . Dementia Neg Hx   . Bipolar disorder Neg Hx   . Depression Neg Hx   . Drug abuse Neg Hx   . OCD Neg Hx      Current medication list and allergy/intolerance information reviewed:    Current Outpatient Medications  Medication Sig Dispense Refill  . Adalimumab (HUMIRA PEN [place]) Inject into the skin.    Marland Kitchen AMBULATORY NON FORMULARY MEDICATION Medication Name: Gilmer Mor, use as needed.  Dx:  Rheumatoid Arthritis 1 Units 0  . atorvastatin (LIPITOR) 40 MG tablet Take 1 tablet (40 mg total) by mouth daily. 90 tablet 3  . buprenorphine (BUTRANS) 5 MCG/HR PTWK patch Place 5 mcg onto the skin once a week.    . chlorthalidone (HYGROTON) 50 MG tablet Take 1 tablet (50 mg total) by mouth daily. 90 tablet 1  . cyclobenzaprine (FLEXERIL) 10 MG tablet Take 1 tablet (10 mg total) by mouth 3 (three) times daily as needed for muscle spasms. 90 tablet 5  . docusate sodium (COLACE) 100 MG capsule Take 1 capsule (100 mg total) by mouth 2 (two) times daily. 10 capsule 0  . escitalopram (LEXAPRO) 10 MG tablet Take 1 tablet (10 mg total) by mouth daily. 30 tablet 3  . methotrexate (RHEUMATREX) 2.5 MG tablet Take 20 mg by mouth once a week. Caution:Chemotherapy. Protect from light.    . pantoprazole (PROTONIX) 40 MG tablet Take 1 tablet (40 mg total) by mouth daily. 90 tablet 3  . pregabalin (LYRICA) 50 MG capsule Take 1 capsule (50 mg total) by mouth daily. 90 capsule 2  . promethazine (PHENERGAN) 25 MG tablet Take by mouth.    . SUMAtriptan (IMITREX) 100 MG tablet Take 0.5 tablets (50 mg total) by mouth every 2 (two) hours as needed for migraine. May repeat in 2 hours if headache persists or recurs. 10 tablet 2  . varenicline (CHANTIX CONTINUING MONTH PAK) 1 MG tablet Take 1 tablet (1 mg total) by mouth 2 (two) times daily. 60 tablet 1  . vitamin C (ASCORBIC ACID) 500 MG tablet Take 1 tablet (500 mg total) by mouth daily. 50 tablet 0  . amLODipine (NORVASC) 10 MG tablet One tablet by mouth every day for blood pressure control. 90 tablet 2   No current facility-administered medications for this visit.     Allergies  Allergen Reactions  . Benadryl [Diphenhydramine Hcl] Other (See Comments)    Causes severe anxiety  . Benzamide Derivatives Other (See Comments)    blisters  . Benzocaine Hives and Other (See Comments)    blisters  . Lisinopril Cough      Review of Systems:  Constitutional:  No   fever, no chills  HEENT: No  headache, no vision change  Cardiac: No  chest pain, No  pressure, No palpitations, No  Orthopnea  Respiratory:  +shortness of breath. +Cough  Gastrointestinal: No  abdominal pain, No  nausea, No  vomiting,  Musculoskeletal: No new myalgia/arthralgia  Skin: No  Rash,  Neurologic: No  weakness, +lightheaded but no vertigo, No  slurred speech/focal weakness/facial droop  Psychiatric: No  concerns with depression, No  concerns with anxiety, No sleep problems, No mood problems  Exam:  BP 126/89   Pulse 69   Temp 97.7 F (36.5 C) (Oral)   Resp 16   Ht 5\' 8"  (1.727 m)   Wt 231 lb 11.2 oz (105.1 kg)   SpO2 94%   BMI 35.23 kg/m    Orthostatic VS for  the past 24 hrs:  BP- Lying Pulse- Lying BP- Sitting Pulse- Sitting BP- Standing at 0 minutes Pulse- Standing at 0 minutes  12/30/16 0904 128/81 65 137/87 72 124/80 73     Constitutional: VS see above. General Appearance: alert, well-developed, well-nourished, NAD  Eyes: Normal lids and conjunctive, non-icteric sclera  Ears, Nose, Mouth, Throat: MMM, Normal external inspection ears/nares/mouth/lips/gums.   Neck: No masses, trachea midline.  Respiratory: Normal respiratory effort. no wheeze, no rhonchi, no rales, diminished breath sounds bilaterally  Cardiovascular: S1/S2 normal, no murmur, no rub/gallop auscultated. RRR. No lower extremity edema  Musculoskeletal: Gait normal.   Neurological: Normal balance/coordination. No tremor. No cranial nerve deficit on limited exam. Motor and sensation intact and symmetric. Cerebellar reflexes intact.   Skin: warm, dry  Psychiatric: Normal judgment/insight. Normal mood and affect. Oriented x3.     ASSESSMENT/PLAN:   Essential hypertension -    - Plan: amLODipine (NORVASC) 10 MG tablet, chlorthalidone (HYGROTON) 50 MG tablet  Essential hypertension - Plan: amLODipine (NORVASC) 10 MG tablet, chlorthalidone (HYGROTON) 50 MG tablet  Chronic pain  syndrome - Plan: buprenorphine (BUTRANS) 5 MCG/HR PTWK patch, cyclobenzaprine (FLEXERIL) 10 MG tablet  Other migraine without status migrainosus, not intractable - Plan: SUMAtriptan (IMITREX) 100 MG tablet  Restless leg - Plan: pregabalin (LYRICA) 50 MG capsule  Needs flu shot - Plan: Flu Vaccine QUAD 6+ mos PF IM (Fluarix Quad PF)  Tobacco use  Weight gain  SOB (shortness of breath) - Likely COPD, restart inhalers, consider PFT - Plan: fluticasone furoate-vilanterol (BREO ELLIPTA) 200-25 MCG/INH AEPB, albuterol (PROVENTIL HFA;VENTOLIN HFA) 108 (90 Base) MCG/ACT inhaler  Nausea - Plan: promethazine (PHENERGAN) 25 MG tablet    Patient Instructions  Plan:  For the breathing, we are starting a medication similar to Advair, this medicine is called Breo. The device is a little bit different, but the medication is essentially the same. We are also sending in a rescue inhaler for you to use as needed. If these inhalers are working well for you, nothing else really to do. He would like to schedule a lung function test, please call the office and let them know he needs to be scheduled for this procedure. Please stay off of your inhalers for about 48 hours prior to the test.  If dizziness persists, may want to try coming off of the Lyrica, or discussing it with pain management that you are having this side effect with the morphine patch. I sent refills of the Phenergan to use as needed.  See below for diet/exercise advice. If you would like to see a nutritionist, I am happy to put in this referral.      Weight loss: important things to remember  It is hard work! You will have setbacks, but don't get discouraged. The goal is not short-term success, it is long-term health.   Looking at the numbers is important to track your progress and set goals, but how you are feeling and your overall health are the most important things! BMI and pounds and calories and miles logged aren't everything - they  are tools to help [place] reach your goals.  You can do this!!!   Things to remember for exercise for weight loss:   Please note - I am not a certified personal trainer. I can present you with ideas and general workout goals, but an exercise program is largely up to you. Find something you can stick with, and something you enjoy!   As you progress in your exercise  regimen think about gradually increasing the following, week by week:   intensity (how strenuous is your workout)  frequency (how often you are exercising)  duration (how many minutes at a time you are exercising)  Walking for 20 minutes a day is certainly better than nothing, but more strenuous exercise will develop better cardiovascular fitness.   interval training (high-intensity alternating with low-intensity, think walk/jog rather than just walk)  muscle strengthening exercises (weight lifting, calisthenics, yoga) - this also helps prevent osteoporosis!   Things to remember for diet changes for weight loss:   Please note - I am not a certified dietician. I can present you with ideas and general diet goals, but a meal plan is largely up to you. I am happy to refer you to a dietician who can give you a detailed meal plan.  Apps/logs are crucial to track how you're eating! It's not realistic to be logging everything you eat forever, but when you're starting a healthy eating lifestyle it's very helpful, and checking in with logs now and then helps you stick to your program!   Calorie restriction with the goal weight loss of no more than one to one and a half pounds per week.   Increase lean protein such as chicken, fish, [place].   Decrease fatty foods such as dairy, butter.   Decrease sugary foods. Avoid sugary drinks such as soda or juice.  Increase fiber found in fruit and vegetables.       Visit summary with medication list and pertinent instructions was printed for patient to review. All questions at time of visit  were answered - patient instructed to contact office with any additional concerns. ER/RTC precautions were reviewed with the patient. Follow-up plan: Return in about 6 months (around 06/30/2017) for [facility], sooner if needed, as needed for lung function test (call office) .  Note: Total time spent 25 minutes, greater than 50% of the visit was spent face-to-face counseling and coordinating care for the following: The primary encounter diagnosis was Essential hypertension. Diagnoses of Essential hypertension, Chronic pain syndrome, Other migraine without status migrainosus, not intractable, Restless leg, Needs flu shot, Tobacco use, Weight gain, SOB (shortness of breath), and Nausea were also pertinent to this visit.Marland Kitchen  Please note: voice recognition software was used to produce this document, and typos may escape review. Please contact Dr. Lyn Hollingshead for any needed clarifications.

## 2016-12-31 ENCOUNTER — Emergency Department (HOSPITAL_COMMUNITY): Payer: Medicare Other

## 2016-12-31 ENCOUNTER — Emergency Department (HOSPITAL_COMMUNITY)
Admission: EM | Admit: 2016-12-31 | Discharge: 2017-01-01 | Disposition: A | Discharge status: Home / Self Care | Payer: Medicare Other | Attending: Emergency Medicine | Admitting: Emergency Medicine

## 2016-12-31 DIAGNOSIS — R42 Dizziness and giddiness: Secondary | ICD-10-CM | POA: Diagnosis not present

## 2016-12-31 DIAGNOSIS — Z79899 Other long term (current) drug therapy: Secondary | ICD-10-CM | POA: Insufficient documentation

## 2016-12-31 DIAGNOSIS — J45909 Unspecified asthma, uncomplicated: Secondary | ICD-10-CM | POA: Insufficient documentation

## 2016-12-31 DIAGNOSIS — F17210 Nicotine dependence, cigarettes, uncomplicated: Secondary | ICD-10-CM | POA: Insufficient documentation

## 2016-12-31 DIAGNOSIS — R55 Syncope and collapse: Secondary | ICD-10-CM | POA: Insufficient documentation

## 2016-12-31 DIAGNOSIS — I10 Essential (primary) hypertension: Secondary | ICD-10-CM | POA: Diagnosis not present

## 2016-12-31 DIAGNOSIS — S0990XA Unspecified injury of head, initial encounter: Secondary | ICD-10-CM | POA: Diagnosis not present

## 2016-12-31 DIAGNOSIS — R404 Transient alteration of awareness: Secondary | ICD-10-CM | POA: Diagnosis not present

## 2016-12-31 LAB — CBC WITH DIFFERENTIAL/PLATELET
Basophils Absolute: 0 10*3/uL (ref 0.0–0.1)
Basophils Relative: 0 %
Eosinophils Absolute: 0.2 10*3/uL (ref 0.0–0.7)
Eosinophils Relative: 2 %
HCT: 43.1 % (ref 39.0–52.0)
Hemoglobin: 14.8 g/dL (ref 13.0–17.0)
Lymphocytes Relative: 30 %
Lymphs Abs: 2.3 10*3/uL (ref 0.7–4.0)
MCH: 30.1 pg (ref 26.0–34.0)
MCHC: 34.3 g/dL (ref 30.0–36.0)
MCV: 87.6 fL (ref 78.0–100.0)
Monocytes Absolute: 0.9 10*3/uL (ref 0.1–1.0)
Monocytes Relative: 12 %
Neutro Abs: 4.3 10*3/uL (ref 1.7–7.7)
Neutrophils Relative %: 56 %
Platelets: 322 10*3/uL (ref 150–400)
RBC: 4.92 MIL/uL (ref 4.22–5.81)
RDW: 13.6 % (ref 11.5–15.5)
WBC: 7.7 10*3/uL (ref 4.0–10.5)

## 2016-12-31 LAB — BASIC METABOLIC PANEL
Anion gap: 9 (ref 5–15)
BUN: 12 mg/dL (ref 6–20)
CO2: 23 mmol/L (ref 22–32)
Calcium: 9.3 mg/dL (ref 8.9–10.3)
Chloride: 104 mmol/L (ref 101–111)
Creatinine, Ser: 0.95 mg/dL (ref 0.61–1.24)
GFR calc Af Amer: >60 mL/min (ref >60)
GFR calc non Af Amer: >60 mL/min (ref >60)
Glucose, Bld: 94 mg/dL (ref 65–99)
Potassium: 3.8 mmol/L (ref 3.5–5.1)
Sodium: 136 mmol/L (ref 135–145)

## 2016-12-31 LAB — I-STAT TROPONIN, ED: Troponin i, poc: 0 ng/mL (ref 0.00–0.08)

## 2016-12-31 MED ORDER — MECLIZINE HCL 25 MG PO TABS
50.0000 mg | ORAL_TABLET | Freq: Once | ORAL | Status: AC
  Administered 2016-12-31: 50 mg via ORAL
  Filled 2016-12-31: qty 2

## 2016-12-31 MED ORDER — SODIUM CHLORIDE 0.9 % IV SOLN: INTRAVENOUS | Status: DC

## 2016-12-31 MED ORDER — SODIUM CHLORIDE 0.9 % IV BOLUS (SEPSIS)
1000.0000 mL | Freq: Once | INTRAVENOUS | Status: AC
  Administered 2016-12-31: 1000 mL via INTRAVENOUS

## 2016-12-31 MED ORDER — LACTATED RINGERS IV BOLUS (SEPSIS)
1000.0000 mL | Freq: Once | INTRAVENOUS | Status: AC
  Administered 2016-12-31: 1000 mL via INTRAVENOUS

## 2016-12-31 NOTE — ED Notes (Signed)
Patient able to ambulate independently slightly unsteady gait but ambulated down hall

## 2016-12-31 NOTE — ED Notes (Signed)
CBG resulted at 21:46 was 94 (from BMP).

## 2016-12-31 NOTE — ED Triage Notes (Signed)
Per EMS pt from home. Was outside and felt dizzy. Came inside and per family felt "weak" and went to his knees and then sat down. Pt has no recollection of this.  DId not hit his head per family.  VSS.  [id number]

## 2016-12-31 NOTE — ED Notes (Signed)
Patient transported to CT 

## 2017-01-01 ENCOUNTER — Telehealth: Payer: Self-pay | Admitting: Osteopathic Medicine

## 2017-01-01 DIAGNOSIS — R42 Dizziness and giddiness: Secondary | ICD-10-CM | POA: Diagnosis not present

## 2017-01-01 LAB — I-STAT TROPONIN, ED: Troponin i, poc: 0 ng/mL (ref 0.00–0.08)

## 2017-01-01 MED ORDER — MECLIZINE HCL 25 MG PO TABS: 25.0000 mg | ORAL_TABLET | Freq: Three times a day (TID) | ORAL | 0 refills | Status: DC | PRN

## 2017-01-01 NOTE — Discharge Instructions (Signed)
Your workup including lab work and imaging has been very reassuring in the ED today.  Please make sure you follow-up with your primary care doctor today.  May possibly need referral to cardiologist for cardiac monitoring.  Take Antivert for any dizziness.  Drink plenty of fluids.  If the dizziness returns and persist, you have a severe headache, any chest pain, shortness of breath palpitations return to the ED immediately.

## 2017-01-01 NOTE — Telephone Encounter (Signed)
Pt called and stated he had to go to the hospital yesterday for dizziness and passing out. I have him scheduled for a HFU next week but the pt stated that the doctor at the hospital told him to call his pcp to get a 30 day heart monitor ordered for him. Thanks

## 2017-01-01 NOTE — Telephone Encounter (Signed)
Please call patient re: cardiac monitor question - typically these are ordered by a cardiologist. I reviewed the ER records, I don't see that the ER physician has documented anything yet, so will await complete records to see exactly what was discussed with the patient, or await patient HFU visit to evaluate myself before putting in any orders.   I'm happy to talk to this patient more once I see him for follow-up. Would like to evaluate him personally or at a minimum review detailed records before placing any orders and thus taking responsibility for any test results before I have all the information.    Note:  AVS instructions from ER (emphasis mine in bold): "Your workup including lab work and imaging has been very reassuring in the ED today. Please make sure you follow-up with your primary care doctor today. May possibly need referral to cardiologist for cardiac monitoring. Take Antivert for any dizziness. Drink plenty of fluids. If the dizziness returns and persist, you have a severe headache, any chest pain, shortness of breath palpitations return to the ED immediately."

## 2017-01-01 NOTE — ED Notes (Signed)
Pt departed in NAD.  

## 2017-01-01 NOTE — ED Provider Notes (Signed)
[facility] EMERGENCY DEPARTMENT Provider Note   CSN: [id number] Arrival date & time: 12/31/16  2055     History   Chief Complaint Chief Complaint  Patient presents with  . Loss of Consciousness    HPI [name] is a 46 y.o. male.  HPI 46 year old Caucasian male past medical history significant for hypertension, depression, anxiety presents to the emergency department today for evaluation of syncopal episode by EMS.  The patient states that this evening prior to arrival patient was unloading groceries from the car.  States that he felt very dizzy room spinning sensation.  States that he went inside and the next thing that he remembers was waking up to EMS at his side.  Wife at bedside states that patient came into the house and started fumbling and dropped to his knees and then folded into the chair.  The patient's wife denies any head injury.  States that he was out for approximately 15 minutes.  States that she felt like he was not breathing.  Called 911.  When EMS arrived patient awoken.  Was somewhat disoriented that did resolve prior to my examination.  At this time patient complains of persistent dizziness room spinning sensation.  Patient denies any associated chest pains, palpitations, shortness of breath, headache, vision changes prior to the syncopal episode.  The patient states that he has been having intermittent dizziness the past several weeks after starting Lexapro.  Patient also states that he was put on to a morphine patch starting 2 days ago for his chronic pain.  Patient denies any pain at this time.  Pt denies any fever, chill, ha, vision changes,  congestion, neck pain, cp, sob, cough, abd pain, n/v/d, urinary symptoms, change in bowel habits, melena, hematochezia, lower extremity paresthesias.  Past Medical History:  Diagnosis Date  . Anxiety   . Arthritis   . Asthma    history WL:[id number]  . Depression   . Essential hypertension 12/11/2011   . Gout   . Headache   . History of hiatal hernia   . History of lithotripsy   . Hypertension   . Kidney stones   . Lumbar degenerative disc disease 12/11/2011  . Nephrolithiasis 12/11/2011  . Pericarditis   . Renal tubular acidosis   . Rheumatoid arthritis(714.0)     Patient Active Problem List   Diagnosis Date Noted  . Migraine 03/08/2016  . History of obstructive sleep apnea 01/17/2016  . Arthritis of left wrist 11/06/2015  . Left hand pain 08/27/2015  . Rectal bleeding 05/11/2013  . Hyperglycemia 11/02/2012  . Tinnitus 11/02/2012  . Major depressive disorder, single episode, severe, without mention of psychotic behavior 08/31/2012  . Infected sebaceous cyst of skin 04/19/2012  . Hypercalcemia 03/16/2012  . Tobacco use 03/04/2012  . Restless leg 02/12/2012  . Degenerative disc disease, cervical 12/28/2011  . Hyperlipidemia LDL goal <130 12/21/2011  . Rheumatoid arthritis (HCC) 12/17/2011  . Essential hypertension 12/11/2011  . Lumbar degenerative disc disease 12/11/2011  . Nephrolithiasis 12/11/2011  . Chronic pain of multiple joints 12/11/2011  . Right shoulder pain 12/11/2011  . Left shoulder pain 12/11/2011    Past Surgical History:  Procedure Laterality Date  . ANTERIOR CERVICAL DECOMP/DISCECTOMY FUSION    . CARPAL TUNNEL RELEASE Left 11/06/2015   Procedure: LEFT WRIST CARPAL TUNNEL RELEASE;  Surgeon: Bradly Bienenstock, MD;  Location: [facility] OR;  Service: Orthopedics;  Laterality: Left;  . HEMORROIDECTOMY    . KIDNEY STONE SURGERY    . WRIST  FUSION Left 11/06/2015   Procedure: ARTHRODESIS LEFT WRIST;  Surgeon: Bradly Bienenstock, MD;  Location: [facility] OR;  Service: Orthopedics;  Laterality: Left;       Home Medications    Prior to Admission medications   Medication Sig Start Date End Date Taking? Authorizing Provider  Adalimumab (HUMIRA PEN [place]) Inject into the skin.    [provider]  albuterol (PROVENTIL HFA;VENTOLIN HFA) 108 (90 Base) MCG/ACT inhaler Inhale  1-2 puffs into the lungs every 4 (four) hours as needed for wheezing or shortness of breath. 12/30/16 12/30/17  Sunnie Nielsen, DO  AMBULATORY NON FORMULARY MEDICATION Medication Name: Gilmer Mor, use as needed.  Dx: Rheumatoid Arthritis 05/11/13   Laren Boom, DO  amLODipine (NORVASC) 10 MG tablet One tablet by mouth every day for blood pressure control. 12/30/16 01/19/18  Sunnie Nielsen, DO  atorvastatin (LIPITOR) 40 MG tablet Take 1 tablet (40 mg total) by mouth daily. 03/06/16   Sunnie Nielsen, DO  buprenorphine Lavera Guise) 5 MCG/HR PTWK patch Place 5 mcg onto the skin once a week.    [provider]  chlorthalidone (HYGROTON) 50 MG tablet Take 1 tablet (50 mg total) by mouth daily. 12/30/16   Sunnie Nielsen, DO  cyclobenzaprine (FLEXERIL) 10 MG tablet Take 1 tablet (10 mg total) by mouth 3 (three) times daily as needed for muscle spasms. 12/30/16   Sunnie Nielsen, DO  docusate sodium (COLACE) 100 MG capsule Take 1 capsule (100 mg total) by mouth 2 (two) times daily. 11/06/15   Bradly Bienenstock, MD  escitalopram (LEXAPRO) 10 MG tablet Take 1 tablet (10 mg total) by mouth daily. 10/14/16   Thresa Ross, MD  fluticasone furoate-vilanterol (BREO ELLIPTA) 200-25 MCG/INH AEPB Inhale 1 puff into the lungs daily. 12/30/16   Sunnie Nielsen, DO  meclizine (ANTIVERT) 25 MG tablet Take 1 tablet (25 mg total) by mouth 3 (three) times daily as needed for dizziness. 01/01/17   Rise Mu, PA-C  methotrexate (RHEUMATREX) 2.5 MG tablet Take 20 mg by mouth once a week. Caution:Chemotherapy. Protect from light.    [provider]  pantoprazole (PROTONIX) 40 MG tablet Take 1 tablet (40 mg total) by mouth daily. 07/09/16   Sunnie Nielsen, DO  pregabalin (LYRICA) 50 MG capsule Take 1 capsule (50 mg total) by mouth daily. 12/30/16   Sunnie Nielsen, DO  promethazine (PHENERGAN) 25 MG tablet Take 1 tablet (25 mg total) by mouth every 6 (six) hours as needed for nausea or vomiting.  12/30/16   Sunnie Nielsen, DO  SUMAtriptan (IMITREX) 100 MG tablet Take 0.5 tablets (50 mg total) by mouth every 2 (two) hours as needed for migraine. May repeat in 2 hours if headache persists or recurs. 12/30/16   Sunnie Nielsen, DO  varenicline (CHANTIX CONTINUING MONTH PAK) 1 MG tablet Take 1 tablet (1 mg total) by mouth 2 (two) times daily. 04/03/16   Sunnie Nielsen, DO  vitamin C (ASCORBIC ACID) 500 MG tablet Take 1 tablet (500 mg total) by mouth daily. 11/06/15   Bradly Bienenstock, MD    Family History Family History  Problem Relation Age of Onset  . Hypertension Father   . Alcohol abuse Father   . Heart attack Father 38  . Diabetes Mellitus II Father   . Coronary artery disease Father   . Diabetes Mellitus II Mother        Micah Flesher into a diabetic coma, per pt.  . Cancer - Colon Mother   . Coronary artery disease Mother   . Heart attack Mother  54  . Ovarian cancer Mother   . Uterine cancer Mother   . Heart attack Brother   . Coronary artery disease Brother   . Heart murmur Sister   . Dementia Neg Hx   . Bipolar disorder Neg Hx   . Depression Neg Hx   . Drug abuse Neg Hx   . OCD Neg Hx     Social History Social History   Tobacco Use  . Smoking status: Current Every Day Smoker    Packs/day: 0.25    Years: 30.00    Pack years: 7.50    Types: Cigarettes  . Smokeless tobacco: Former Neurosurgeon    Types: Chew    Quit date: 65  . Tobacco comment: Rx written for Chantix from Dr. Lyn Hollingshead.  Substance Use Topics  . Alcohol use: No  . Drug use: No     Allergies   Benadryl [diphenhydramine hcl]; Benzamide derivatives; Benzocaine; and Lisinopril   Review of Systems Review of Systems  Constitutional: Negative for chills and fever.  HENT: Negative for congestion and sore throat.   Eyes: Negative for visual disturbance.  Respiratory: Negative for cough and shortness of breath.   Cardiovascular: Negative for chest pain.  Gastrointestinal: Negative for abdominal pain,  diarrhea, nausea and vomiting.  Genitourinary: Negative for dysuria, flank pain, frequency, hematuria and urgency.  Musculoskeletal: Negative for arthralgias and myalgias.  Skin: Negative for rash.  Neurological: Positive for dizziness, weakness and light-headedness. Negative for syncope, numbness and headaches.  Psychiatric/Behavioral: Negative for sleep disturbance. The patient is not nervous/anxious.      Physical Exam Updated Vital Signs BP 134/87   Pulse 71   Resp (!) 21   SpO2 96%   Physical Exam  Constitutional: He is oriented to person, place, and time. He appears well-developed and well-nourished.  Non-toxic appearance. No distress.  HENT:  Head: Normocephalic and atraumatic.  Mouth/Throat: Oropharynx is clear and moist.  Eyes: Conjunctivae are normal. Pupils are equal, round, and reactive to light. Right eye exhibits no discharge. Left eye exhibits no discharge.  Neck: Normal range of motion. Neck supple.  Cardiovascular: Normal rate, regular rhythm, normal heart sounds and intact distal pulses. Exam reveals no gallop and no friction rub.  No murmur heard. Pulmonary/Chest: Effort normal and breath sounds normal. No stridor. No respiratory distress. He has no wheezes. He has no rales. He exhibits no tenderness.  Abdominal: Soft. Bowel sounds are normal. He exhibits no distension. There is no tenderness. There is no rebound and no guarding.  Musculoskeletal: Normal range of motion. He exhibits no tenderness.  Lymphadenopathy:    He has no cervical adenopathy.  Neurological: He is alert and oriented to person, place, and time.  The patient is alert, attentive, and oriented x 3. Speech is clear. Cranial nerve II-VII grossly intact. Negative pronator drift. Sensation intact. Strength 5/5 in all extremities. Reflexes 2+ and symmetric at biceps, triceps, knees, and ankles. Rapid alternating movement and fine finger movements intact.  Gait not assessed due to safety. No  skew.  Patient does have some horizontal nystagmus with EOMs and rotation of head.  States this exacerbates his dizziness.  Skin: Skin is warm and dry. Capillary refill takes less than 2 seconds. No rash noted.  Psychiatric: His behavior is normal. Judgment and thought content normal.  Nursing note and vitals reviewed.    ED Treatments / Results  Labs (all labs ordered are listed, but only abnormal results are displayed) Labs Reviewed  BASIC METABOLIC PANEL  CBC  WITH DIFFERENTIAL/PLATELET  CBG MONITORING, ED  I-STAT TROPONIN, ED  I-STAT TROPONIN, ED    EKG  EKG Interpretation  Date/Time:  Thursday December 31 2016 21:00:56 EST Ventricular Rate:  78 PR Interval:    QRS Duration: 75 QT Interval:  350 QTC Calculation: 399 R Axis:   65 Text Interpretation:  Sinus rhythm No significant change since last tracing Confirmed by Shaune Pollack [phone]) on 12/31/2016 10:45:01 PM Also confirmed by Shaune Pollack [phone]), editor Elita Quick ([id number])  on 01/01/2017 7:05:19 AM       Radiology Dg Chest 2 View  Result Date: 12/31/2016 CLINICAL DATA:  Recent syncopal episode EXAM: CHEST  2 VIEW COMPARISON:  02/19/2009 FINDINGS: The heart size and mediastinal contours are within normal limits. Both lungs are clear. The visualized skeletal structures are unremarkable. IMPRESSION: No active cardiopulmonary disease. Electronically Signed   By: Alcide Clever M.D.   On: 12/31/2016 21:50   Ct Head Wo Contrast  Result Date: 12/31/2016 CLINICAL DATA:  46 y/o  M; weakness and fall. EXAM: CT HEAD WITHOUT CONTRAST TECHNIQUE: Contiguous axial images were obtained from the base of the skull through the vertex without intravenous contrast. COMPARISON:  None. FINDINGS: Brain: No evidence of acute infarction, hemorrhage, hydrocephalus, extra-axial collection or mass lesion/mass effect. Vascular: No hyperdense vessel or unexpected calcification. Skull: Normal. Negative for fracture or focal lesion.  Sinuses/Orbits: Right maxillary sinus and right frontal sinus opacification with chronic inflammatory changes of the walls of the sinuses. Otherwise normally aerated paranasal sinuses. Normal aeration of mastoid air cells. Orbits are unremarkable. Other: None. IMPRESSION: 1. No acute intracranial abnormality.  Normal CT of the brain. 2. Chronic right frontal and right maxillary sinus disease. Electronically Signed   By: Mitzi Hansen M.D.   On: 12/31/2016 22:24    Procedures Procedures (including critical care time)  Medications Ordered in ED Medications  sodium chloride 0.9 % bolus 1,000 mL (0 mLs Intravenous Stopped 12/31/16 2236)  meclizine (ANTIVERT) tablet 50 mg (50 mg Oral Given 12/31/16 2136)  lactated ringers bolus 1,000 mL (0 mLs Intravenous Stopped 01/01/17 0017)     Initial Impression / Assessment and Plan / ED Course  I have reviewed the triage vital signs and the nursing notes.  Pertinent labs & imaging results that were available during my care of the patient were reviewed by me and considered in my medical decision making (see chart for details).     Patient presents to the ED for evaluation of dizziness and syncopal episode.  Patient had dizziness prior to syncopal episode.  Denies head injury.  Patient denies any associated chest pain or shortness of breath prior to this syncopal episode.  Exam patient is overall well-appearing.  Vital signs are reassuring.  No focal neuro deficit noted on exam.  Patient does have some horizontal nystagmus with position that seems consistent with peripheral vertigo otherwise neurological exam was unremarkable.  Heart regular rate and rhythm.  No murmurs rubs or gallops.  Lungs clear to auscultation bilaterally.  No abdominal tenderness.  Lab work reassuring.  Negative troponin.  EKG shows no ischemic changes and shows normal sinus rhythm.  Chest x-ray unremarkable.  Head CT unremarkable.  The patient has been much hours in the ED  with no worsening symptoms.  Patient feels improved after fluids and meclizine.  Patient has no signs of arrhythmia in the ED.  Patient has no history of congestive heart failure.  BUN and creatinine are normal.  Patient has no high risk features based  on the [place] syncope rule.  Patient symptoms seem consistent with vertigo however given the syncope unclear possibly due to vasovagal.  Low suspicion for CVA or cardiac arrhythmia.  Low suspicion for PE.  Did offer patient admission to the hospital for syncope workup and he states that he would like to follow-up in the outpatient setting. Shared decision making with patient and feel that this is reasonable.  Encouraged follow-up with PCP and possibly cardiology.  Patient able to ambulate in the ED with no ataxia.  Feels improved without any dizziness.  Tolerating p.o. fluids and food.  Feel stable for discharge at this time.  Pt is hemodynamically stable, in NAD, & able to ambulate in the ED. Evaluation does not show pathology that would require ongoing emergent intervention or inpatient treatment. I explained the diagnosis to the patient. Pain has been managed & has no complaints prior to dc. Pt is comfortable with above plan and is stable for discharge at this time. All questions were answered prior to disposition. Strict return precautions for f/u to the ED were discussed. Encouraged follow up with PCP.  Seen and evaluated by attending Dr. Erma HeritageIsaacs who is agreed with the above plan.    Final Clinical Impressions(s) / ED Diagnoses   Final diagnoses:  Dizziness  Syncope and collapse    ED Discharge Orders        Ordered    meclizine (ANTIVERT) 25 MG tablet  3 times daily PRN     01/01/17 0038       Rise MuLeaphart, Dayna Geurts T, PA-C 01/02/17 0831    Shaune PollackIsaacs, [place], MD 01/02/17 [phone]

## 2017-01-02 NOTE — ED Provider Notes (Signed)
Medical screening examination/treatment/procedure(s) were conducted as a shared visit with non-physician practitioner(s) and myself.  I personally evaluated the patient during the encounter. Briefly, the patient is a 46 yo M here with syncopal episode. This occurred while turning his head and he had preceding dizziness, nausea. On exam, he has positional, fatigue able horizontal nystagmus reproduced when looking left, c/w peripheral vertigo. No signs of central process, no skew, no ataxia, dysmetria, speech changes. Regarding etiology of his syncope, unclear if this was vagal in setting of vertigo versus idiopathic. His EKG is normal with normal intervals. Normal lytes. He is not anemic. He has no h/o CHF or arrhythmia. His tele has been stable in ED without arrhythmia. No high risk features based on [place] syncope rules. Discussed management options, including supportive care for vertigo with outpt follow-up versus obs in ED, and based on shared decision making pt would like to return home. Gave strict return precautions. Given that the nature of his sx are not completely clear, i'll have him call his PCP to possibly set up a Holter monitor for cardiac monitoring as outpt. Pt in agreement.   EKG Interpretation  Date/Time:  Thursday December 31 2016 21:00:56 EST Ventricular Rate:  78 PR Interval:    QRS Duration: 75 QT Interval:  350 QTC Calculation: 399 R Axis:   65 Text Interpretation:  Sinus rhythm No significant change since last tracing Confirmed by Shaune Pollack [phone]) on 12/31/2016 10:45:01 PM Also confirmed by Shaune Pollack [phone]), editor Elita Quick ([id number])  on 01/01/2017 7:05:19 AM          Shaune Pollack, MD 01/02/17 [phone]

## 2017-01-05 NOTE — Telephone Encounter (Signed)
Left VM to Pt to return clinic call, callback information provided.

## 2017-01-06 ENCOUNTER — Other Ambulatory Visit: Payer: Self-pay

## 2017-01-06 ENCOUNTER — Emergency Department (HOSPITAL_COMMUNITY)
Admission: EM | Admit: 2017-01-06 | Discharge: 2017-01-06 | Disposition: A | Discharge status: Home / Self Care | Payer: Medicare Other | Attending: Emergency Medicine | Admitting: Emergency Medicine

## 2017-01-06 ENCOUNTER — Ambulatory Visit (INDEPENDENT_AMBULATORY_CARE_PROVIDER_SITE_OTHER): Payer: Medicare Other | Admitting: Osteopathic Medicine

## 2017-01-06 ENCOUNTER — Emergency Department (HOSPITAL_COMMUNITY): Payer: Medicare Other

## 2017-01-06 ENCOUNTER — Encounter: Payer: Self-pay | Admitting: Osteopathic Medicine

## 2017-01-06 ENCOUNTER — Encounter (HOSPITAL_COMMUNITY): Payer: Self-pay | Admitting: Emergency Medicine

## 2017-01-06 VITALS — BP 143/101 | HR 111

## 2017-01-06 DIAGNOSIS — Z79899 Other long term (current) drug therapy: Secondary | ICD-10-CM | POA: Diagnosis not present

## 2017-01-06 DIAGNOSIS — J45909 Unspecified asthma, uncomplicated: Secondary | ICD-10-CM | POA: Diagnosis not present

## 2017-01-06 DIAGNOSIS — R42 Dizziness and giddiness: Secondary | ICD-10-CM | POA: Diagnosis not present

## 2017-01-06 DIAGNOSIS — R5383 Other fatigue: Secondary | ICD-10-CM | POA: Insufficient documentation

## 2017-01-06 DIAGNOSIS — F17210 Nicotine dependence, cigarettes, uncomplicated: Secondary | ICD-10-CM | POA: Insufficient documentation

## 2017-01-06 DIAGNOSIS — R531 Weakness: Secondary | ICD-10-CM | POA: Diagnosis not present

## 2017-01-06 DIAGNOSIS — R0602 Shortness of breath: Secondary | ICD-10-CM | POA: Diagnosis not present

## 2017-01-06 DIAGNOSIS — R55 Syncope and collapse: Secondary | ICD-10-CM | POA: Diagnosis not present

## 2017-01-06 DIAGNOSIS — I10 Essential (primary) hypertension: Secondary | ICD-10-CM | POA: Diagnosis not present

## 2017-01-06 DIAGNOSIS — R51 Headache: Secondary | ICD-10-CM | POA: Diagnosis not present

## 2017-01-06 DIAGNOSIS — G894 Chronic pain syndrome: Secondary | ICD-10-CM | POA: Diagnosis not present

## 2017-01-06 DIAGNOSIS — R079 Chest pain, unspecified: Secondary | ICD-10-CM | POA: Diagnosis not present

## 2017-01-06 DIAGNOSIS — R7989 Other specified abnormal findings of blood chemistry: Secondary | ICD-10-CM | POA: Diagnosis not present

## 2017-01-06 DIAGNOSIS — R519 Headache, unspecified: Secondary | ICD-10-CM

## 2017-01-06 DIAGNOSIS — R404 Transient alteration of awareness: Secondary | ICD-10-CM | POA: Diagnosis not present

## 2017-01-06 LAB — CBC WITH DIFFERENTIAL/PLATELET
Basophils Absolute: 0 10*3/uL (ref 0.0–0.1)
Basophils Relative: 0 %
Eosinophils Absolute: 0.2 10*3/uL (ref 0.0–0.7)
Eosinophils Relative: 2 %
HCT: 51.1 % (ref 39.0–52.0)
Hemoglobin: 18.3 g/dL — ABNORMAL HIGH (ref 13.0–17.0)
Lymphocytes Relative: 24 %
Lymphs Abs: 2.6 10*3/uL (ref 0.7–4.0)
MCH: 31 pg (ref 26.0–34.0)
MCHC: 35.8 g/dL (ref 30.0–36.0)
MCV: 86.6 fL (ref 78.0–100.0)
Monocytes Absolute: 1 10*3/uL (ref 0.1–1.0)
Monocytes Relative: 9 %
Neutro Abs: 7.1 10*3/uL (ref 1.7–7.7)
Neutrophils Relative %: 65 %
Platelets: 331 10*3/uL (ref 150–400)
RBC: 5.9 MIL/uL — ABNORMAL HIGH (ref 4.22–5.81)
RDW: 13.4 % (ref 11.5–15.5)
WBC: 10.9 10*3/uL — ABNORMAL HIGH (ref 4.0–10.5)

## 2017-01-06 LAB — COMPREHENSIVE METABOLIC PANEL
ALT: 29 U/L (ref 17–63)
AST: 28 U/L (ref 15–41)
Albumin: 4.1 g/dL (ref 3.5–5.0)
Alkaline Phosphatase: 52 U/L (ref 38–126)
Anion gap: 11 (ref 5–15)
BUN: 16 mg/dL (ref 6–20)
CO2: 22 mmol/L (ref 22–32)
Calcium: 10.1 mg/dL (ref 8.9–10.3)
Chloride: 100 mmol/L — ABNORMAL LOW (ref 101–111)
Creatinine, Ser: 0.96 mg/dL (ref 0.61–1.24)
GFR calc Af Amer: >60 mL/min (ref >60)
GFR calc non Af Amer: >60 mL/min (ref >60)
Glucose, Bld: 109 mg/dL — ABNORMAL HIGH (ref 65–99)
Potassium: 3.9 mmol/L (ref 3.5–5.1)
Sodium: 133 mmol/L — ABNORMAL LOW (ref 135–145)
Total Bilirubin: 0.7 mg/dL (ref 0.3–1.2)
Total Protein: 7.7 g/dL (ref 6.5–8.1)

## 2017-01-06 LAB — TROPONIN I: Troponin I: <0.03 ng/mL (ref <0.03)

## 2017-01-06 LAB — D-DIMER, QUANTITATIVE: D-Dimer, Quant: 3.54 ug/mL-FEU — ABNORMAL HIGH (ref 0.00–0.50)

## 2017-01-06 MED ORDER — MORPHINE SULFATE (PF) 4 MG/ML IV SOLN
4.0000 mg | Freq: Once | INTRAVENOUS | Status: AC
  Administered 2017-01-06: 4 mg via INTRAVENOUS
  Filled 2017-01-06: qty 1

## 2017-01-06 MED ORDER — IOPAMIDOL (ISOVUE-370) INJECTION 76%
INTRAVENOUS | Status: AC
  Administered 2017-01-06: 100 mL
  Filled 2017-01-06: qty 100

## 2017-01-06 MED ORDER — SODIUM CHLORIDE 0.9 % IV SOLN
INTRAVENOUS | Status: DC
  Administered 2017-01-06: 18:00:00 via INTRAVENOUS

## 2017-01-06 MED ORDER — KETOROLAC TROMETHAMINE 15 MG/ML IJ SOLN
15.0000 mg | Freq: Once | INTRAMUSCULAR | Status: DC
  Filled 2017-01-06: qty 1

## 2017-01-06 MED ORDER — PROMETHAZINE HCL 25 MG/ML IJ SOLN
12.5000 mg | Freq: Once | INTRAMUSCULAR | Status: AC
  Administered 2017-01-06: 12.5 mg via INTRAVENOUS
  Filled 2017-01-06: qty 1

## 2017-01-06 MED ORDER — METOCLOPRAMIDE HCL 5 MG/ML IJ SOLN
10.0000 mg | Freq: Once | INTRAMUSCULAR | Status: DC
  Filled 2017-01-06: qty 2

## 2017-01-06 NOTE — ED Notes (Signed)
Pt unable to provide urine sample at this time 

## 2017-01-06 NOTE — Addendum Note (Signed)
Addended by: Deirdre Pippins on: 01/06/2017 03:50 PM   Modules accepted: Orders

## 2017-01-06 NOTE — ED Triage Notes (Signed)
Pt arrived [facility] from [employer] for c/o headache, dizziness, and fatigue. Pt seen here recently for syncope and left AMA. A & O x 4, lethargic but responsive to verbal commands. L sided weakness at baseline from previous injury. BP 130/98 HR98 and SR CBG 107 RR22 2LNC O2sat 97%

## 2017-01-06 NOTE — Progress Notes (Addendum)
HPI: [name] is a 46 y.o. male who  has a past medical history of Anxiety, Arthritis, Asthma, Depression, Essential hypertension (12/11/2011), Gout, Headache, History of hiatal hernia, History of lithotripsy, Hypertension, Kidney stones, Lumbar degenerative disc disease (12/11/2011), Nephrolithiasis (12/11/2011), Pericarditis, Renal tubular acidosis, and Rheumatoid arthritis(714.0).  he presents to [facility] today, 01/06/17,  for chief complaint of:  Chief Complaint  Patient presents with  . Shortness of Breath    Recent ER visit for syncopal episode at home - LOC about 15+ minutes after bringing in some groceries.   In ER, no obvious etiology, exam (+)nystagmus to L, dizziness, nausea. Concern for vagal syncope in setting of vertigo, recent dizziness with ?d/t morphine patches vs other meds. Was mild issue fo rhim and not too bothersome when I saw him last.   Today, wife brings him in for follow-up. Since leaving ER he has been SOB, lethargic. He stopped the morphine patch on his own yesterday. C/o SOB on exertion worse.    Patient is accompanied by wife who assists with history-taking.   Past medical, surgical, social and family history reviewed:  Patient Active Problem List   Diagnosis Date Noted  . Migraine 03/08/2016  . History of obstructive sleep apnea 01/17/2016  . Arthritis of left wrist 11/06/2015  . Left hand pain 08/27/2015  . Rectal bleeding 05/11/2013  . Hyperglycemia 11/02/2012  . Tinnitus 11/02/2012  . Major depressive disorder, single episode, severe, without mention of psychotic behavior 08/31/2012  . Infected sebaceous cyst of skin 04/19/2012  . Hypercalcemia 03/16/2012  . Tobacco use 03/04/2012  . Restless leg 02/12/2012  . Degenerative disc disease, cervical 12/28/2011  . Hyperlipidemia LDL goal <130 12/21/2011  . Rheumatoid arthritis ([place]) 12/17/2011  . Essential hypertension 12/11/2011  . Lumbar degenerative disc  disease 12/11/2011  . Nephrolithiasis 12/11/2011  . Chronic pain of multiple joints 12/11/2011  . Right shoulder pain 12/11/2011  . Left shoulder pain 12/11/2011    Past Surgical History:  Procedure Laterality Date  . ANTERIOR CERVICAL DECOMP/DISCECTOMY FUSION    . CARPAL TUNNEL RELEASE Left 11/06/2015   Procedure: LEFT WRIST CARPAL TUNNEL RELEASE;  Surgeon: Iran Planas, MD;  Location: [place];  Service: Orthopedics;  Laterality: Left;  . HEMORROIDECTOMY    . KIDNEY STONE SURGERY    . WRIST FUSION Left 11/06/2015   Procedure: ARTHRODESIS LEFT WRIST;  Surgeon: Iran Planas, MD;  Location: [place];  Service: Orthopedics;  Laterality: Left;    Social History   Tobacco Use  . Smoking status: Current Every Day Smoker    Packs/day: 0.25    Years: 30.00    Pack years: 7.50    Types: Cigarettes  . Smokeless tobacco: Former Systems developer    Types: Chew    Quit date: 51  . Tobacco comment: Rx written for Chantix from Dr. Sheppard Coil.  Substance Use Topics  . Alcohol use: No    Family History  Problem Relation Age of Onset  . Hypertension Father   . Alcohol abuse Father   . Heart attack Father 19  . Diabetes Mellitus II Father   . Coronary artery disease Father   . Diabetes Mellitus II Mother        Martin Majestic into a diabetic coma, per pt.  . Cancer - Colon Mother   . Coronary artery disease Mother   . Heart attack Mother 76  . Ovarian cancer Mother   . Uterine cancer Mother   . Heart attack Brother   .  Coronary artery disease Brother   . Heart murmur Sister   . Dementia Neg Hx   . Bipolar disorder Neg Hx   . Depression Neg Hx   . Drug abuse Neg Hx   . OCD Neg Hx      Current medication list and allergy/intolerance information reviewed:    Current Outpatient Medications  Medication Sig Dispense Refill  . Adalimumab (HUMIRA PEN [place]) Inject into the skin.    Marland Kitchen albuterol (PROVENTIL HFA;VENTOLIN HFA) 108 (90 Base) MCG/ACT inhaler Inhale 1-2 puffs into the lungs every 4 (four) hours as  needed for wheezing or shortness of breath. 1 Inhaler 3  . AMBULATORY NON FORMULARY MEDICATION Medication Name: Kasandra Knudsen, use as needed.  Dx: Rheumatoid Arthritis 1 Units 0  . amLODipine (NORVASC) 10 MG tablet One tablet by mouth every day for blood pressure control. 90 tablet 3  . atorvastatin (LIPITOR) 40 MG tablet Take 1 tablet (40 mg total) by mouth daily. 90 tablet 3  . chlorthalidone (HYGROTON) 50 MG tablet Take 1 tablet (50 mg total) by mouth daily. 90 tablet 3  . cyclobenzaprine (FLEXERIL) 10 MG tablet Take 1 tablet (10 mg total) by mouth 3 (three) times daily as needed for muscle spasms. 90 tablet 5  . docusate sodium (COLACE) 100 MG capsule Take 1 capsule (100 mg total) by mouth 2 (two) times daily. 10 capsule 0  . escitalopram (LEXAPRO) 10 MG tablet Take 1 tablet (10 mg total) by mouth daily. 30 tablet 3  . fluticasone furoate-vilanterol (BREO ELLIPTA) 200-25 MCG/INH AEPB Inhale 1 puff into the lungs daily. 60 each 3  . meclizine (ANTIVERT) 25 MG tablet Take 1 tablet (25 mg total) by mouth 3 (three) times daily as needed for dizziness. 30 tablet 0  . methotrexate (RHEUMATREX) 2.5 MG tablet Take 20 mg by mouth once a week. Caution:Chemotherapy. Protect from light.    . pantoprazole (PROTONIX) 40 MG tablet Take 1 tablet (40 mg total) by mouth daily. 90 tablet 3  . pregabalin (LYRICA) 50 MG capsule Take 1 capsule (50 mg total) by mouth daily. 90 capsule 3  . promethazine (PHENERGAN) 25 MG tablet Take 1 tablet (25 mg total) by mouth every 6 (six) hours as needed for nausea or vomiting. 60 tablet 3  . SUMAtriptan (IMITREX) 100 MG tablet Take 0.5 tablets (50 mg total) by mouth every 2 (two) hours as needed for migraine. May repeat in 2 hours if headache persists or recurs. 10 tablet 3  . vitamin C (ASCORBIC ACID) 500 MG tablet Take 1 tablet (500 mg total) by mouth daily. 50 tablet 0  . buprenorphine (BUTRANS) 5 MCG/HR PTWK patch Place 5 mcg onto the skin once a week.    . varenicline (CHANTIX  CONTINUING MONTH PAK) 1 MG tablet Take 1 tablet (1 mg total) by mouth 2 (two) times daily. (Patient not taking: Reported on 01/06/2017) 60 tablet 1   No current facility-administered medications for this visit.     Allergies  Allergen Reactions  . Benadryl [Diphenhydramine Hcl] Other (See Comments)    Causes severe anxiety  . Benzamide Derivatives Other (See Comments)    blisters  . Benzocaine Hives and Other (See Comments)    blisters  . Lisinopril Cough      Review of Systems:  Constitutional:  No  fever, no chills, No recent illness, No unintentional weight changes. +significant fatigue.   HEENT: No  headache, no vision change  Cardiac: No  chest pain, No  pressure, No palpitations  Respiratory:  No  shortness of breath. No  Cough  Gastrointestinal: No  abdominal pain, +nausea, No  vomiting,  No  blood in stool, No  diarrhea, No  constipation   Musculoskeletal: No new myalgia/arthralgia  Skin: No  Rash  Neurologic: +weakness, +dizziness, No  slurred speech/focal weakness/facial droop  Psychiatric: No  concerns with depression, +concerns with anxiety, No sleep problems, No mood problems  Exam:  BP (!) 143/101   Pulse (!) 111   SpO2 100%   Constitutional: VS see above. General Appearance: alert initially but then more lethargic though responsive to commands, well-developed, well-nourished, initially apparently anxious but then more lethargic though still awake  Eyes: Normal lids and conjunctive, non-icteric sclera, PERRLA, EOMI   Ears, Nose, Mouth, Throat: MMM, Normal external inspection ears/nares/mouth/lips/gums.   Neck: No masses, trachea midline. No thyroid enlargement.   Respiratory: Normal respiratory effort. no wheeze, no rhonchi, no rales  Cardiovascular: S1/S2 normal, no murmur, no rub/gallop auscultated. RRR. No lower extremity edema.   Gastrointestinal: Nontender, no masses.  Musculoskeletal: Gait unsteady. Weaker grip L but pt states chronic d/t  shoulder/neck issues.   Neurological: Impaired balance/coordination. Mild tremor.   Skin: warm, dry, intact.  Psychiatric: Normal judgment/insight. Normal mood and affect. Oriented x3 initially but then only to person, could not name place, year.   Dg Chest 2 View  Result Date: 12/31/2016 CLINICAL DATA:  Recent syncopal episode EXAM: CHEST  2 VIEW COMPARISON:  02/19/2009 FINDINGS: The heart size and mediastinal contours are within normal limits. Both lungs are clear. The visualized skeletal structures are unremarkable. IMPRESSION: No active cardiopulmonary disease. Electronically Signed   By: Inez Catalina M.D.   On: 12/31/2016 21:50   Ct Head Wo Contrast  Result Date: 12/31/2016 CLINICAL DATA:  46 y/o  M; weakness and fall. EXAM: CT HEAD WITHOUT CONTRAST TECHNIQUE: Contiguous axial images were obtained from the base of the skull through the vertex without intravenous contrast. COMPARISON:  None. FINDINGS: Brain: No evidence of acute infarction, hemorrhage, hydrocephalus, extra-axial collection or mass lesion/mass effect. Vascular: No hyperdense vessel or unexpected calcification. Skull: Normal. Negative for fracture or focal lesion. Sinuses/Orbits: Right maxillary sinus and right frontal sinus opacification with chronic inflammatory changes of the walls of the sinuses. Otherwise normally aerated paranasal sinuses. Normal aeration of mastoid air cells. Orbits are unremarkable. Other: None. IMPRESSION: 1. No acute intracranial abnormality.  Normal CT of the brain. 2. Chronic right frontal and right maxillary sinus disease. Electronically Signed   By: Kristine Garbe M.D.   On: 12/31/2016 22:24   Recent Results (from the past 2160 hour(s))  Basic metabolic panel     Status: None   Collection Time: 12/31/16  9:05 PM  Result Value Ref Range   Sodium 136 135 - 145 mmol/L   Potassium 3.8 3.5 - 5.1 mmol/L   Chloride 104 101 - 111 mmol/L   CO2 23 22 - 32 mmol/L   Glucose, Bld 94 65 - 99 mg/dL    BUN 12 6 - 20 mg/dL   Creatinine, Ser 0.95 0.61 - 1.24 mg/dL   Calcium 9.3 8.9 - 10.3 mg/dL   GFR calc non Af Amer >60 >60 mL/min   GFR calc Af Amer >60 >60 mL/min    Comment: (NOTE) The eGFR has been calculated using the CKD EPI equation. This calculation has not been validated in all clinical situations. eGFR's persistently <60 mL/min signify possible Chronic Kidney Disease.    Anion gap 9 5 - 15  CBC WITH  DIFFERENTIAL     Status: None   Collection Time: 12/31/16  9:05 PM  Result Value Ref Range   WBC 7.7 4.0 - 10.5 K/uL   RBC 4.92 4.22 - 5.81 MIL/uL   Hemoglobin 14.8 13.0 - 17.0 g/dL   HCT 43.1 39.0 - 52.0 %   MCV 87.6 78.0 - 100.0 fL   MCH 30.1 26.0 - 34.0 pg   MCHC 34.3 30.0 - 36.0 g/dL   RDW 13.6 11.5 - 15.5 %   Platelets 322 150 - 400 K/uL   Neutrophils Relative % 56 %   Neutro Abs 4.3 1.7 - 7.7 K/uL   Lymphocytes Relative 30 %   Lymphs Abs 2.3 0.7 - 4.0 K/uL   Monocytes Relative 12 %   Monocytes Absolute 0.9 0.1 - 1.0 K/uL   Eosinophils Relative 2 %   Eosinophils Absolute 0.2 0.0 - 0.7 K/uL   Basophils Relative 0 %   Basophils Absolute 0.0 0.0 - 0.1 K/uL  I-stat troponin, ED     Status: None   Collection Time: 12/31/16  9:12 PM  Result Value Ref Range   Troponin i, poc 0.00 0.00 - 0.08 ng/mL   Comment 3            Comment: Due to the release kinetics of cTnI, a negative result within the first hours of the onset of symptoms does not rule out myocardial infarction with certainty. If myocardial infarction is still suspected, repeat the test at appropriate intervals.   I-stat troponin, ED     Status: None   Collection Time: 01/01/17 12:20 AM  Result Value Ref Range   Troponin i, poc 0.00 0.00 - 0.08 ng/mL   Comment 3            Comment: Due to the release kinetics of cTnI, a negative result within the first hours of the onset of symptoms does not rule out myocardial infarction with certainty. If myocardial infarction is still suspected, repeat the test  at appropriate intervals.      ASSESSMENT/PLAN: The primary encounter diagnosis was Syncope, unspecified syncope type. Diagnoses of Shortness of breath and Chronic pain syndrome were also pertinent to this visit.  Initially patient declined ER referral and we were going to get some repeat labs and imaging outpatient to at least r/o PE and work up w/ Echo, Holter, Carotids for syncope. Pt mental status deteriorated in the office to where he was responsive and awake but not alert.   At this point, suspect some component of opiate withdrawal but given his initial incomplete w/u for syncope, could be anything. No clinicah HF or respiratory failure at this point. Suspect more likely neuro issue. Repeat EKG here stable from previous exam.         Visit summary with medication list and pertinent instructions was printed for patient to review. All questions at time of visit were answered - patient instructed to contact office with any additional concerns. ER/RTC precautions were reviewed with the patient.   Follow-up plan: No Follow-up on file. Will see how things go at the ER   Note: Total time spent 40 minutes, greater than 50% of the visit was spent face-to-face counseling and coordinating care for the following: The primary encounter diagnosis was Syncope, unspecified syncope type. Diagnoses of Shortness of breath and Chronic pain syndrome were also pertinent to this visit.Marland Kitchen  Please note: voice recognition software was used to produce this document, and typos may escape review. Please contact Dr. Sheppard Coil for  any needed clarifications.

## 2017-01-06 NOTE — Telephone Encounter (Signed)
Pt was seen in office today and sent to ED for eval.

## 2017-01-08 DIAGNOSIS — M47817 Spondylosis without myelopathy or radiculopathy, lumbosacral region: Secondary | ICD-10-CM | POA: Diagnosis not present

## 2017-01-08 DIAGNOSIS — G894 Chronic pain syndrome: Secondary | ICD-10-CM | POA: Diagnosis not present

## 2017-01-08 DIAGNOSIS — M47812 Spondylosis without myelopathy or radiculopathy, cervical region: Secondary | ICD-10-CM | POA: Diagnosis not present

## 2017-01-08 DIAGNOSIS — M069 Rheumatoid arthritis, unspecified: Secondary | ICD-10-CM | POA: Diagnosis not present

## 2017-01-12 NOTE — ED Provider Notes (Signed)
[facility] EMERGENCY DEPARTMENT Provider Note   CSN: [id number] Arrival date & time: 01/06/17  1630     History   Chief Complaint Chief Complaint  Patient presents with  . Fatigue  . Headache  . Dizziness    HPI [name] is a 46 y.o. male.  HPI   46yM brought in by [facility] from [facility] for c/o headache, dizziness, and fatigue. Pt seen here recently for syncope and left AMA. Says doesn't feel any better. Diffuse HA. No fever or chills. No neck pain. Feels like he has no energy and lightheaded. Bright light bothering him. No change in visual acuity. Denies trauma.     Past Medical History:  Diagnosis Date  . Anxiety   . Arthritis   . Asthma    history EZ:MOQHUTMLY  . Depression   . Essential hypertension 12/11/2011  . Gout   . Headache   . History of hiatal hernia   . History of lithotripsy   . Hypertension   . Kidney stones   . Lumbar degenerative disc disease 12/11/2011  . Nephrolithiasis 12/11/2011  . Pericarditis   . Renal tubular acidosis   . Rheumatoid arthritis(714.0)     Patient Active Problem List   Diagnosis Date Noted  . Migraine 03/08/2016  . History of obstructive sleep apnea 01/17/2016  . Arthritis of left wrist 11/06/2015  . Left hand pain 08/27/2015  . Rectal bleeding 05/11/2013  . Hyperglycemia 11/02/2012  . Tinnitus 11/02/2012  . Major depressive disorder, single episode, severe, without mention of psychotic behavior 08/31/2012  . Infected sebaceous cyst of skin 04/19/2012  . Hypercalcemia 03/16/2012  . Tobacco use 03/04/2012  . Restless leg 02/12/2012  . Degenerative disc disease, cervical 12/28/2011  . Hyperlipidemia LDL goal <130 12/21/2011  . Rheumatoid arthritis (HCC) 12/17/2011  . Essential hypertension 12/11/2011  . Lumbar degenerative disc disease 12/11/2011  . Nephrolithiasis 12/11/2011  . Chronic pain of multiple joints 12/11/2011  . Right shoulder pain 12/11/2011  . Left  shoulder pain 12/11/2011    Past Surgical History:  Procedure Laterality Date  . ANTERIOR CERVICAL DECOMP/DISCECTOMY FUSION    . CARPAL TUNNEL RELEASE Left 11/06/2015   Procedure: LEFT WRIST CARPAL TUNNEL RELEASE;  Surgeon: Bradly Bienenstock, MD;  Location: [facility] OR;  Service: Orthopedics;  Laterality: Left;  . HEMORROIDECTOMY    . KIDNEY STONE SURGERY    . WRIST FUSION Left 11/06/2015   Procedure: ARTHRODESIS LEFT WRIST;  Surgeon: Bradly Bienenstock, MD;  Location: [facility] OR;  Service: Orthopedics;  Laterality: Left;       Home Medications    Prior to Admission medications   Medication Sig Start Date End Date Taking? Authorizing Provider  Adalimumab (HUMIRA PEN [place]) Inject into the skin.    [provider]  albuterol (PROVENTIL HFA;VENTOLIN HFA) 108 (90 Base) MCG/ACT inhaler Inhale 1-2 puffs into the lungs every 4 (four) hours as needed for wheezing or shortness of breath. 12/30/16 12/30/17  Sunnie Nielsen, DO  AMBULATORY NON FORMULARY MEDICATION Medication Name: Gilmer Mor, use as needed.  Dx: Rheumatoid Arthritis 05/11/13   Laren Boom, DO  amLODipine (NORVASC) 10 MG tablet One tablet by mouth every day for blood pressure control. 12/30/16 01/19/18  Sunnie Nielsen, DO  atorvastatin (LIPITOR) 40 MG tablet Take 1 tablet (40 mg total) by mouth daily. 03/06/16   Sunnie Nielsen, DO  buprenorphine Lavera Guise) 5 MCG/HR PTWK patch Place 5 mcg onto the skin once a week.    [provider]  chlorthalidone (  HYGROTON) 50 MG tablet Take 1 tablet (50 mg total) by mouth daily. 12/30/16   Sunnie Nielsen, DO  cyclobenzaprine (FLEXERIL) 10 MG tablet Take 1 tablet (10 mg total) by mouth 3 (three) times daily as needed for muscle spasms. 12/30/16   Sunnie Nielsen, DO  docusate sodium (COLACE) 100 MG capsule Take 1 capsule (100 mg total) by mouth 2 (two) times daily. 11/06/15   Bradly Bienenstock, MD  escitalopram (LEXAPRO) 10 MG tablet Take 1 tablet (10 mg total) by mouth daily. 10/14/16   Thresa Ross,  MD  fluticasone furoate-vilanterol (BREO ELLIPTA) 200-25 MCG/INH AEPB Inhale 1 puff into the lungs daily. 12/30/16   Sunnie Nielsen, DO  meclizine (ANTIVERT) 25 MG tablet Take 1 tablet (25 mg total) by mouth 3 (three) times daily as needed for dizziness. 01/01/17   Rise Mu, PA-C  methotrexate (RHEUMATREX) 2.5 MG tablet Take 20 mg by mouth once a week. Caution:Chemotherapy. Protect from light.    [provider]  pantoprazole (PROTONIX) 40 MG tablet Take 1 tablet (40 mg total) by mouth daily. 07/09/16   Sunnie Nielsen, DO  pregabalin (LYRICA) 50 MG capsule Take 1 capsule (50 mg total) by mouth daily. 12/30/16   Sunnie Nielsen, DO  promethazine (PHENERGAN) 25 MG tablet Take 1 tablet (25 mg total) by mouth every 6 (six) hours as needed for nausea or vomiting. 12/30/16   Sunnie Nielsen, DO  SUMAtriptan (IMITREX) 100 MG tablet Take 0.5 tablets (50 mg total) by mouth every 2 (two) hours as needed for migraine. May repeat in 2 hours if headache persists or recurs. 12/30/16   Sunnie Nielsen, DO  vitamin C (ASCORBIC ACID) 500 MG tablet Take 1 tablet (500 mg total) by mouth daily. 11/06/15   Bradly Bienenstock, MD    Family History Family History  Problem Relation Age of Onset  . Hypertension Father   . Alcohol abuse Father   . Heart attack Father 17  . Diabetes Mellitus II Father   . Coronary artery disease Father   . Diabetes Mellitus II Mother        Micah Flesher into a diabetic coma, per pt.  . Cancer - Colon Mother   . Coronary artery disease Mother   . Heart attack Mother 72  . Ovarian cancer Mother   . Uterine cancer Mother   . Heart attack Brother   . Coronary artery disease Brother   . Heart murmur Sister   . Dementia Neg Hx   . Bipolar disorder Neg Hx   . Depression Neg Hx   . Drug abuse Neg Hx   . OCD Neg Hx     Social History Social History   Tobacco Use  . Smoking status: Current Every Day Smoker    Packs/day: 0.25    Years: 30.00    Pack years:  7.50    Types: Cigarettes  . Smokeless tobacco: Former Neurosurgeon    Types: Chew    Quit date: 39  . Tobacco comment: Rx written for Chantix from Dr. Lyn Hollingshead.  Substance Use Topics  . Alcohol use: No  . Drug use: No     Allergies   Benadryl [diphenhydramine hcl]; Benzamide derivatives; Benzocaine; and Lisinopril   Review of Systems Review of Systems All systems reviewed and negative, other than as noted in HPI.   Physical Exam Updated Vital Signs BP 121/74   Pulse 80   Temp 98.5 F (36.9 C) (Oral)   Resp 19   Ht 5\' 8"  (1.727 m)  Wt 104.8 kg (231 lb)   SpO2 97%   BMI 35.12 kg/m   Physical Exam  Constitutional: He is oriented to person, place, and time. He appears well-developed and well-nourished. No distress.  HENT:  Head: Normocephalic and atraumatic.  Eyes: Conjunctivae are normal. Right eye exhibits no discharge. Left eye exhibits no discharge.  Neck: Neck supple.  Cardiovascular: Normal rate, regular rhythm and normal heart sounds. Exam reveals no gallop and no friction rub.  No murmur heard. Pulmonary/Chest: Effort normal and breath sounds normal. No respiratory distress.  Abdominal: Soft. He exhibits no distension. There is no tenderness.  Musculoskeletal: He exhibits no edema or tenderness.  Neurological: He is alert and oriented to person, place, and time. No cranial nerve deficit. He exhibits normal muscle tone. Coordination normal.  Skin: Skin is warm and dry.  Psychiatric: He has a normal mood and affect. His behavior is normal. Thought content normal.  Nursing note and vitals reviewed.    ED Treatments / Results  Labs (all labs ordered are listed, but only abnormal results are displayed) Labs Reviewed  CBC WITH DIFFERENTIAL/PLATELET - Abnormal; Notable for the following components:      Result Value   WBC 10.9 (*)    RBC 5.90 (*)    Hemoglobin 18.3 (*)    All other components within normal limits  COMPREHENSIVE METABOLIC PANEL - Abnormal;  Notable for the following components:   Sodium 133 (*)    Chloride 100 (*)    Glucose, Bld 109 (*)    All other components within normal limits  D-DIMER, QUANTITATIVE (NOT AT [facility]) - Abnormal; Notable for the following components:   D-Dimer, Quant 3.54 (*)    All other components within normal limits  TROPONIN I    EKG  EKG Interpretation  Date/Time:  Wednesday January 06 2017 18:11:16 EST Ventricular Rate:  84 PR Interval:    QRS Duration: 79 QT Interval:  343 QTC Calculation: 406 R Axis:   53 Text Interpretation:  Sinus rhythm No significant change since last tracing Confirmed by Shaune Pollack [phone]) on 01/07/2017 3:24:14 PM       Radiology No results found.   Dg Chest 2 View  Result Date: 12/31/2016 CLINICAL DATA:  Recent syncopal episode EXAM: CHEST  2 VIEW COMPARISON:  02/19/2009 FINDINGS: The heart size and mediastinal contours are within normal limits. Both lungs are clear. The visualized skeletal structures are unremarkable. IMPRESSION: No active cardiopulmonary disease. Electronically Signed   By: Alcide Clever M.D.   On: 12/31/2016 21:50   Ct Head Wo Contrast  Result Date: 12/31/2016 CLINICAL DATA:  46 y/o  M; weakness and fall. EXAM: CT HEAD WITHOUT CONTRAST TECHNIQUE: Contiguous axial images were obtained from the base of the skull through the vertex without intravenous contrast. COMPARISON:  None. FINDINGS: Brain: No evidence of acute infarction, hemorrhage, hydrocephalus, extra-axial collection or mass lesion/mass effect. Vascular: No hyperdense vessel or unexpected calcification. Skull: Normal. Negative for fracture or focal lesion. Sinuses/Orbits: Right maxillary sinus and right frontal sinus opacification with chronic inflammatory changes of the walls of the sinuses. Otherwise normally aerated paranasal sinuses. Normal aeration of mastoid air cells. Orbits are unremarkable. Other: None. IMPRESSION: 1. No acute intracranial abnormality.  Normal CT of the brain.  2. Chronic right frontal and right maxillary sinus disease. Electronically Signed   By: Mitzi Hansen M.D.   On: 12/31/2016 22:24   Ct Angio Chest Pe W And/or Wo Contrast  Result Date: 01/06/2017 CLINICAL DATA:  Short of  breath for 2 weeks.  Positive D-dimer EXAM: CT ANGIOGRAPHY CHEST WITH CONTRAST TECHNIQUE: Multidetector CT imaging of the chest was performed using the standard protocol during bolus administration of intravenous contrast. Multiplanar CT image reconstructions and MIPs were obtained to evaluate the vascular anatomy. CONTRAST:  <See Chart> ISOVUE-370 IOPAMIDOL (ISOVUE-370) INJECTION 76% COMPARISON:  Chest radiograph 01/06/2017 FINDINGS: Cardiovascular: No filling defects within the pulmonary arteries arteries to suggest acute pulmonary embolism. No significant vascular findings. Normal heart size. No pericardial effusion. Mediastinum/Nodes: No axillary or supraclavicular adenopathy. No mediastinal hilar adenopathy. No pericardial fluid. Esophagus normal. Lungs/Pleura: Lung bases of mild atelectasis. No pulmonary infarction. No infiltrate or pulmonary edema. No pneumothorax. Upper Abdomen: Limited view of the liver, kidneys, pancreas are unremarkable. Normal adrenal glands. Musculoskeletal: No acute osseous abnormality. Review of the MIP images confirms the above findings. IMPRESSION: 1. No acute pulmonary embolism. 2. Mild bibasilar atelectasis Electronically Signed   By: Genevive BiStewart  Edmunds M.D.   On: 01/06/2017 20:49   Mr Brain Wo Contrast  Result Date: 01/06/2017 CLINICAL DATA:  Headache and dizziness EXAM: MRI HEAD WITHOUT CONTRAST TECHNIQUE: Multiplanar, multiecho pulse sequences of the brain and surrounding structures were obtained without intravenous contrast. COMPARISON:  Head CT 12/31/2016 FINDINGS: Brain: The midline structures are normal. There is no acute infarct or acute hemorrhage. No mass lesion, hydrocephalus, dural abnormality or extra-axial collection. The brain  parenchymal signal is normal. No age-advanced or lobar predominant atrophy. No chronic microhemorrhage or superficial siderosis. Vascular: Major intracranial arterial and venous sinus flow voids are preserved. Skull and upper cervical spine: The visualized skull base, calvarium, upper cervical spine and extracranial soft tissues are normal. Sinuses/Orbits: Chronic right frontal and maxillary sinus disease. Normal orbits. IMPRESSION: Normal MRI of the brain. Electronically Signed   By: Deatra RobinsonKevin  Herman M.D.   On: 01/06/2017 21:31   Dg Chest Portable 1 View  Result Date: 01/06/2017 CLINICAL DATA:  Right-sided chest pain.  Shortness of breath. EXAM: PORTABLE CHEST 1 VIEW COMPARISON:  12/31/2016 FINDINGS: Cardiomediastinal silhouette is normal. Mediastinal contours appear intact. There is no evidence of focal airspace consolidation, pleural effusion or pneumothorax. Osseous structures are without acute abnormality. Soft tissues are grossly normal. Low cervical spine fusion noted. IMPRESSION: No active disease. Electronically Signed   By: Ted Mcalpineobrinka  Dimitrova M.D.   On: 01/06/2017 18:50    Procedures Procedures (including critical care time)  Medications Ordered in ED Medications  morphine 4 MG/ML injection 4 mg (4 mg Intravenous Given 01/06/17 1826)  promethazine (PHENERGAN) injection 12.5 mg (12.5 mg Intravenous Given 01/06/17 1823)  iopamidol (ISOVUE-370) 76 % injection (100 mLs  Contrast Given 01/06/17 2026)     Initial Impression / Assessment and Plan / ED Course  I have reviewed the triage vital signs and the nursing notes.  Pertinent labs & imaging results that were available during my care of the patient were reviewed by me and considered in my medical decision making (see chart for details).     46yM with multiple complaints. I question how much of symptoms may just be from HA and not feeling well. HA now gone and feels much better. W/u including MR reassuring. It has been determined that  no acute conditions requiring further emergency intervention are present at this time. The patient has been advised of the diagnosis and plan. I reviewed any labs and imaging including any potential incidental findings. We have discussed signs and symptoms that warrant return to the ED and they are listed in the discharge instructions.  Final Clinical Impressions(s) / ED Diagnoses   Final diagnoses:  Nonintractable headache, unspecified chronicity pattern, unspecified headache type  Dizziness    ED Discharge Orders    None       Raeford Razor, MD 01/12/17 1134

## 2017-01-13 ENCOUNTER — Other Ambulatory Visit: Payer: Self-pay | Admitting: Osteopathic Medicine

## 2017-01-13 DIAGNOSIS — R55 Syncope and collapse: Secondary | ICD-10-CM

## 2017-01-13 NOTE — Progress Notes (Signed)
Order changed per radiology protocol.  

## 2017-02-03 ENCOUNTER — Other Ambulatory Visit (HOSPITAL_BASED_OUTPATIENT_CLINIC_OR_DEPARTMENT_OTHER): Payer: Self-pay

## 2017-02-08 ENCOUNTER — Ambulatory Visit (HOSPITAL_BASED_OUTPATIENT_CLINIC_OR_DEPARTMENT_OTHER): Payer: Medicare Other

## 2017-02-09 ENCOUNTER — Ambulatory Visit (HOSPITAL_COMMUNITY): Payer: Self-pay | Admitting: Psychiatry

## 2017-02-09 DIAGNOSIS — M47812 Spondylosis without myelopathy or radiculopathy, cervical region: Secondary | ICD-10-CM | POA: Diagnosis not present

## 2017-02-09 DIAGNOSIS — G894 Chronic pain syndrome: Secondary | ICD-10-CM | POA: Diagnosis not present

## 2017-02-09 DIAGNOSIS — M47817 Spondylosis without myelopathy or radiculopathy, lumbosacral region: Secondary | ICD-10-CM | POA: Diagnosis not present

## 2017-02-09 DIAGNOSIS — M069 Rheumatoid arthritis, unspecified: Secondary | ICD-10-CM | POA: Diagnosis not present

## 2017-02-12 ENCOUNTER — Ambulatory Visit (INDEPENDENT_AMBULATORY_CARE_PROVIDER_SITE_OTHER): Payer: Medicare Other | Admitting: Psychiatry

## 2017-02-12 ENCOUNTER — Encounter (HOSPITAL_COMMUNITY): Payer: Self-pay | Admitting: Psychiatry

## 2017-02-12 DIAGNOSIS — F331 Major depressive disorder, recurrent, moderate: Secondary | ICD-10-CM | POA: Diagnosis not present

## 2017-02-12 DIAGNOSIS — G4700 Insomnia, unspecified: Secondary | ICD-10-CM | POA: Diagnosis not present

## 2017-02-12 DIAGNOSIS — F4323 Adjustment disorder with mixed anxiety and depressed mood: Secondary | ICD-10-CM | POA: Diagnosis not present

## 2017-02-12 DIAGNOSIS — F39 Unspecified mood [affective] disorder: Secondary | ICD-10-CM

## 2017-02-12 DIAGNOSIS — F5102 Adjustment insomnia: Secondary | ICD-10-CM

## 2017-02-12 DIAGNOSIS — F411 Generalized anxiety disorder: Secondary | ICD-10-CM | POA: Diagnosis not present

## 2017-02-12 DIAGNOSIS — Z811 Family history of alcohol abuse and dependence: Secondary | ICD-10-CM

## 2017-02-12 MED ORDER — ESCITALOPRAM OXALATE 10 MG PO TABS: 10.0000 mg | ORAL_TABLET | Freq: Every day | ORAL | 3 refills | Status: DC

## 2017-02-12 NOTE — Progress Notes (Signed)
Patient ID: [name], male   DOB: 03/02/1970, 47 y.o.   MRN: [id number] [facility] Follow-up Outpatient Visit  [name] [id number] 47 y.o.  02/12/2017  Chief Complaint: follow up for depression and anxiety.     History of Present Illness:     HPI Comments: Mr. Giammarco is a 47 y/o male with a past psychiatric history significant for symptoms of anxiety and depression. The patient was initially referred for psychiatric services for medication management.   Doing reasonable not feeling hopeless or depressed some concern about arrhythmia will be on Holter monitor next week because of passing out one or 2 times. He has visited ED and also has gone through regular exams  Relationship going on fine he is married sleep is improved does not need the trazodone anxiety is manageable.   Modifying factors: relationship children His disablity was approved 2016. . Severity: Depression severity 8/10  No psychosis      Past Medical History:  Diagnosis Date  . Anxiety   . Arthritis   . Asthma    history of:childhood  . Depression   . Essential hypertension 12/11/2011  . Gout   . Headache   . History of hiatal hernia   . History of lithotripsy   . Hypertension   . Kidney stones   . Lumbar degenerative disc disease 12/11/2011  . Nephrolithiasis 12/11/2011  . Pericarditis   . Renal tubular acidosis   . Rheumatoid arthritis(714.0)    Family History  Problem Relation Age of Onset  . Hypertension Father   . Alcohol abuse Father   . Heart attack Father 64  . Diabetes Mellitus II Father   . Coronary artery disease Father   . Diabetes Mellitus II Mother        Went into a diabetic coma, per pt.  . Cancer - Colon Mother   . Coronary artery disease Mother   . Heart attack Mother 54  . Ovarian cancer Mother   . Uterine cancer Mother   . Heart attack Brother   . Coronary artery disease Brother   . Heart murmur Sister   . Dementia Neg Hx   . Bipolar  disorder Neg Hx   . Depression Neg Hx   . Drug abuse Neg Hx   . OCD Neg Hx     Outpatient Encounter Medications as of 02/12/2017  Medication Sig  . Adalimumab (HUMIRA PEN [place]) Inject into the skin.  . albuterol (PROVENTIL HFA;VENTOLIN HFA) 108 (90 Base) MCG/ACT inhaler Inhale 1-2 puffs into the lungs every 4 (four) hours as needed for wheezing or shortness of breath.  . AMBULATORY NON FORMULARY MEDICATION Medication Name: Cane, use as needed.  Dx: Rheumatoid Arthritis  . amLODipine (NORVASC) 10 MG tablet One tablet by mouth every day for blood pressure control.  . atorvastatin (LIPITOR) 40 MG tablet Take 1 tablet (40 mg total) by mouth daily.  . buprenorphine (BUTRANS) 5 MCG/HR PTWK patch Place 5 mcg onto the skin once a week.  . chlorthalidone (HYGROTON) 50 MG tablet Take 1 tablet (50 mg total) by mouth daily.  . cyclobenzaprine (FLEXERIL) 10 MG tablet Take 1 tablet (10 mg total) by mouth 3 (three) times daily as needed for muscle spasms.  . docusate sodium (COLACE) 100 MG capsule Take 1 capsule (100 mg total) by mouth 2 (two) times daily.  . escitalopram (LEXAPRO) 10 MG tablet Take 1 tablet (10 mg total) by mouth daily.  . fluticasone furoate-vilanterol (BREO ELLIPTA) 200-25 MCG/INH AEPB   Inhale 1 puff into the lungs daily.  . meclizine (ANTIVERT) 25 MG tablet Take 1 tablet (25 mg total) by mouth 3 (three) times daily as needed for dizziness.  . methotrexate (RHEUMATREX) 2.5 MG tablet Take 20 mg by mouth once a week. Caution:Chemotherapy. Protect from light.  . pantoprazole (PROTONIX) 40 MG tablet Take 1 tablet (40 mg total) by mouth daily.  . pregabalin (LYRICA) 50 MG capsule Take 1 capsule (50 mg total) by mouth daily.  . promethazine (PHENERGAN) 25 MG tablet Take 1 tablet (25 mg total) by mouth every 6 (six) hours as needed for nausea or vomiting.  . SUMAtriptan (IMITREX) 100 MG tablet Take 0.5 tablets (50 mg total) by mouth every 2 (two) hours as needed for migraine. May repeat in 2 hours  if headache persists or recurs.  . vitamin C (ASCORBIC ACID) 500 MG tablet Take 1 tablet (500 mg total) by mouth daily.  . [DISCONTINUED] escitalopram (LEXAPRO) 10 MG tablet Take 1 tablet (10 mg total) by mouth daily.   No facility-administered encounter medications on file as of 02/12/2017.     Recent Results (from the past 2160 hour(s))  Basic metabolic panel     Status: None   Collection Time: 12/31/16  9:05 PM  Result Value Ref Range   Sodium 136 135 - 145 mmol/L   Potassium 3.8 3.5 - 5.1 mmol/L   Chloride 104 101 - 111 mmol/L   CO2 23 22 - 32 mmol/L   Glucose, Bld 94 65 - 99 mg/dL   BUN 12 6 - 20 mg/dL   Creatinine, Ser 0.95 0.61 - 1.24 mg/dL   Calcium 9.3 8.9 - 10.3 mg/dL   GFR calc non Af Amer >60 >60 mL/min   GFR calc Af Amer >60 >60 mL/min    Comment: (NOTE) The eGFR has been calculated using the CKD EPI equation. This calculation has not been validated in all clinical situations. eGFR's persistently <60 mL/min signify possible Chronic Kidney Disease.    Anion gap 9 5 - 15  CBC WITH DIFFERENTIAL     Status: None   Collection Time: 12/31/16  9:05 PM  Result Value Ref Range   WBC 7.7 4.0 - 10.5 K/uL   RBC 4.92 4.22 - 5.81 MIL/uL   Hemoglobin 14.8 13.0 - 17.0 g/dL   HCT 43.1 39.0 - 52.0 %   MCV 87.6 78.0 - 100.0 fL   MCH 30.1 26.0 - 34.0 pg   MCHC 34.3 30.0 - 36.0 g/dL   RDW 13.6 11.5 - 15.5 %   Platelets 322 150 - 400 K/uL   Neutrophils Relative % 56 %   Neutro Abs 4.3 1.7 - 7.7 K/uL   Lymphocytes Relative 30 %   Lymphs Abs 2.3 0.7 - 4.0 K/uL   Monocytes Relative 12 %   Monocytes Absolute 0.9 0.1 - 1.0 K/uL   Eosinophils Relative 2 %   Eosinophils Absolute 0.2 0.0 - 0.7 K/uL   Basophils Relative 0 %   Basophils Absolute 0.0 0.0 - 0.1 K/uL  I-stat troponin, ED     Status: None   Collection Time: 12/31/16  9:12 PM  Result Value Ref Range   Troponin i, poc 0.00 0.00 - 0.08 ng/mL   Comment 3            Comment: Due to the release kinetics of cTnI, a negative  result within the first hours of the onset of symptoms does not rule out myocardial infarction with certainty. If myocardial infarction is  still suspected, repeat the test at appropriate intervals.   I-stat troponin, ED     Status: None   Collection Time: 01/01/17 12:20 AM  Result Value Ref Range   Troponin i, poc 0.00 0.00 - 0.08 ng/mL   Comment 3            Comment: Due to the release kinetics of cTnI, a negative result within the first hours of the onset of symptoms does not rule out myocardial infarction with certainty. If myocardial infarction is still suspected, repeat the test at appropriate intervals.   CBC with Differential     Status: Abnormal   Collection Time: 01/06/17  6:17 PM  Result Value Ref Range   WBC 10.9 (H) 4.0 - 10.5 K/uL   RBC 5.90 (H) 4.22 - 5.81 MIL/uL   Hemoglobin 18.3 (H) 13.0 - 17.0 g/dL   HCT 51.1 39.0 - 52.0 %   MCV 86.6 78.0 - 100.0 fL   MCH 31.0 26.0 - 34.0 pg   MCHC 35.8 30.0 - 36.0 g/dL   RDW 13.4 11.5 - 15.5 %   Platelets 331 150 - 400 K/uL   Neutrophils Relative % 65 %   Neutro Abs 7.1 1.7 - 7.7 K/uL   Lymphocytes Relative 24 %   Lymphs Abs 2.6 0.7 - 4.0 K/uL   Monocytes Relative 9 %   Monocytes Absolute 1.0 0.1 - 1.0 K/uL   Eosinophils Relative 2 %   Eosinophils Absolute 0.2 0.0 - 0.7 K/uL   Basophils Relative 0 %   Basophils Absolute 0.0 0.0 - 0.1 K/uL  Comprehensive metabolic panel     Status: Abnormal   Collection Time: 01/06/17  6:17 PM  Result Value Ref Range   Sodium 133 (L) 135 - 145 mmol/L   Potassium 3.9 3.5 - 5.1 mmol/L   Chloride 100 (L) 101 - 111 mmol/L   CO2 22 22 - 32 mmol/L   Glucose, Bld 109 (H) 65 - 99 mg/dL   BUN 16 6 - 20 mg/dL   Creatinine, Ser 0.96 0.61 - 1.24 mg/dL   Calcium 10.1 8.9 - 10.3 mg/dL   Total Protein 7.7 6.5 - 8.1 g/dL   Albumin 4.1 3.5 - 5.0 g/dL   AST 28 15 - 41 U/L   ALT 29 17 - 63 U/L   Alkaline Phosphatase 52 38 - 126 U/L   Total Bilirubin 0.7 0.3 - 1.2 mg/dL   GFR calc non Af Amer >60  >60 mL/min   GFR calc Af Amer >60 >60 mL/min    Comment: (NOTE) The eGFR has been calculated using the CKD EPI equation. This calculation has not been validated in all clinical situations. eGFR's persistently <60 mL/min signify possible Chronic Kidney Disease.    Anion gap 11 5 - 15  Troponin I     Status: None   Collection Time: 01/06/17  6:17 PM  Result Value Ref Range   Troponin I <0.03 <0.03 ng/mL  D-dimer, quantitative (not at [facility])     Status: Abnormal   Collection Time: 01/06/17  6:17 PM  Result Value Ref Range   D-Dimer, Quant 3.54 (H) 0.00 - 0.50 ug/mL-FEU    Comment: (NOTE) At the manufacturer cut-off of 0.50 ug/mL FEU, this assay has been documented to exclude PE with a sensitivity and negative predictive value of 97 to 99%.  At this time, this assay has not been approved by the FDA to exclude DVT/VTE. Results should be correlated with clinical presentation.       There were no vitals taken for this visit.   Review of Systems  Constitutional: Negative for fever.  Cardiovascular: Negative for palpitations.  Musculoskeletal: Positive for joint pain.  Skin: Negative for rash.  Neurological: Negative for headaches.  Psychiatric/Behavioral: Negative for depression and suicidal ideas.    Mental Status Examination  Appearance: casual and cooperative Alert: Yes Attention: fair  Cooperative: Yes Eye Contact: Good Speech: coherent Psychomotor Activity: Normal Memory/Concentration: adequate Oriented: person, place, time/date and situation Mood: fair Affect: congruent Thought Processes and Associations: Coherent Fund of Knowledge: Fair Thought Content: Suicidal ideation and Homicidal ideation were denied. Denies hallucinations Insight: Fair Judgement: Fair  Diagnosis: Major depressive disorder recurrent moderate to severe. Generalized anxiety disorder. Insomnia. 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 Continue lexapro Insomnia:baseline. Reviewed sleep hygiene. Not taking trazadone Anxiety : not worse. Some concern of heart condition. Will be on monitor next week. Continue lexapro . Dose not changed   Medical complexity: pain can effect mood. On pain meds. Reviewed concerns. Fu 3 months   Merian Capron, MD

## 2017-02-15 ENCOUNTER — Ambulatory Visit (INDEPENDENT_AMBULATORY_CARE_PROVIDER_SITE_OTHER): Payer: Medicare Other

## 2017-02-15 DIAGNOSIS — R55 Syncope and collapse: Secondary | ICD-10-CM | POA: Diagnosis not present

## 2017-03-10 DIAGNOSIS — M069 Rheumatoid arthritis, unspecified: Secondary | ICD-10-CM | POA: Diagnosis not present

## 2017-03-10 DIAGNOSIS — M47817 Spondylosis without myelopathy or radiculopathy, lumbosacral region: Secondary | ICD-10-CM | POA: Diagnosis not present

## 2017-03-10 DIAGNOSIS — M47812 Spondylosis without myelopathy or radiculopathy, cervical region: Secondary | ICD-10-CM | POA: Diagnosis not present

## 2017-03-10 DIAGNOSIS — G894 Chronic pain syndrome: Secondary | ICD-10-CM | POA: Diagnosis not present

## 2017-03-17 ENCOUNTER — Ambulatory Visit: Payer: Self-pay | Admitting: Osteopathic Medicine

## 2017-03-30 ENCOUNTER — Encounter: Payer: Self-pay | Admitting: Osteopathic Medicine

## 2017-03-30 ENCOUNTER — Ambulatory Visit (INDEPENDENT_AMBULATORY_CARE_PROVIDER_SITE_OTHER): Payer: Medicare Other | Admitting: Osteopathic Medicine

## 2017-03-30 VITALS — BP 152/97 | HR 71 | Temp 98.2°F | Wt 231.0 lb

## 2017-03-30 DIAGNOSIS — I10 Essential (primary) hypertension: Secondary | ICD-10-CM

## 2017-03-30 DIAGNOSIS — R55 Syncope and collapse: Secondary | ICD-10-CM

## 2017-03-30 DIAGNOSIS — G43809 Other migraine, not intractable, without status migrainosus: Secondary | ICD-10-CM

## 2017-03-30 MED ORDER — SUMATRIPTAN SUCCINATE 100 MG PO TABS: 50.0000 mg | ORAL_TABLET | ORAL | 3 refills | Status: DC | PRN

## 2017-03-30 MED ORDER — PANTOPRAZOLE SODIUM 40 MG PO TBEC: 40.0000 mg | DELAYED_RELEASE_TABLET | Freq: Every day | ORAL | 3 refills | Status: DC

## 2017-03-30 NOTE — Progress Notes (Signed)
HPI: [name] is a 47 y.o. male who  has a past medical history of Anxiety, Arthritis, Asthma, Depression, Essential hypertension (12/11/2011), Gout, Headache, History of hiatal hernia, History of lithotripsy, Hypertension, Kidney stones, Lumbar degenerative disc disease (12/11/2011), Nephrolithiasis (12/11/2011), Pericarditis, Renal tubular acidosis, and Rheumatoid arthritis(714.0).  he presents to [facility] today, 03/30/17,  for chief complaint of:  Review results  Patient was last seen a few months ago for complaint of syncope. 01/06/2017. He was actually sent to the emergency Department as his mental status deteriorated a bit. Workup there was essentially negative. At that point in the ER he was complaining of headache as his most concerning symptom. MRI of the head was negative, CT angiogram of the chest was negative. Patient is here today to discuss results of Holter monitor. Sinus rhythm with occasional tachycardia/bradycardia, PAC and PVC. Patient has not had any additional episodes of syncope or presyncope. His only other complaint right now is he needs a refill on his Imitrex.  Hypertension: Blood pressure normal at last measurement, goes up and down. No chest pain, pressure, shortness of breath, vision change. Headache as noted above, chronic migraine. Patient with a cold off on medication adjustment if possible.  Past medical history, surgical history, social history and family history reviewed. No updates needed.   Current medication list and allergy/intolerance information reviewed.    Current Outpatient Medications on File Prior to Visit  Medication Sig Dispense Refill  . Adalimumab (HUMIRA PEN [place]) Inject into the skin.    Marland Kitchen albuterol (PROVENTIL HFA;VENTOLIN HFA) 108 (90 Base) MCG/ACT inhaler Inhale 1-2 puffs into the lungs every 4 (four) hours as needed for wheezing or shortness of breath. 1 Inhaler 3  . AMBULATORY NON FORMULARY  MEDICATION Medication Name: Gilmer Mor, use as needed.  Dx: Rheumatoid Arthritis 1 Units 0  . amLODipine (NORVASC) 10 MG tablet One tablet by mouth every day for blood pressure control. 90 tablet 3  . atorvastatin (LIPITOR) 40 MG tablet Take 1 tablet (40 mg total) by mouth daily. 90 tablet 3  . buprenorphine (BUTRANS) 5 MCG/HR PTWK patch Place 5 mcg onto the skin once a week.    . chlorthalidone (HYGROTON) 50 MG tablet Take 1 tablet (50 mg total) by mouth daily. 90 tablet 3  . cyclobenzaprine (FLEXERIL) 10 MG tablet Take 1 tablet (10 mg total) by mouth 3 (three) times daily as needed for muscle spasms. 90 tablet 5  . escitalopram (LEXAPRO) 10 MG tablet Take 1 tablet (10 mg total) by mouth daily. 30 tablet 3  . fluticasone furoate-vilanterol (BREO ELLIPTA) 200-25 MCG/INH AEPB Inhale 1 puff into the lungs daily. 60 each 3  . meclizine (ANTIVERT) 25 MG tablet Take 1 tablet (25 mg total) by mouth 3 (three) times daily as needed for dizziness. 30 tablet 0  . methotrexate (RHEUMATREX) 2.5 MG tablet Take 20 mg by mouth once a week. Caution:Chemotherapy. Protect from light.    . pantoprazole (PROTONIX) 40 MG tablet Take 1 tablet (40 mg total) by mouth daily. 90 tablet 3  . pregabalin (LYRICA) 50 MG capsule Take 1 capsule (50 mg total) by mouth daily. 90 capsule 3  . promethazine (PHENERGAN) 25 MG tablet Take 1 tablet (25 mg total) by mouth every 6 (six) hours as needed for nausea or vomiting. 60 tablet 3  . SUMAtriptan (IMITREX) 100 MG tablet Take 0.5 tablets (50 mg total) by mouth every 2 (two) hours as needed for migraine. May repeat in 2 hours  if headache persists or recurs. 10 tablet 3  . docusate sodium (COLACE) 100 MG capsule Take 1 capsule (100 mg total) by mouth 2 (two) times daily. (Patient not taking: Reported on 03/30/2017) 10 capsule 0  . vitamin C (ASCORBIC ACID) 500 MG tablet Take 1 tablet (500 mg total) by mouth daily. (Patient not taking: Reported on 03/30/2017) 50 tablet 0  . [DISCONTINUED] FLUoxetine  (PROZAC) 40 MG capsule Take 1 capsule (40 mg total) by mouth 2 (two) times daily. 180 capsule 0  . [DISCONTINUED] traZODone (DESYREL) 100 MG tablet Take 1 tablet (100 mg total) by mouth at bedtime as needed for sleep. 30 tablet 1   No current facility-administered medications on file prior to visit.    Allergies  Allergen Reactions  . Benadryl [Diphenhydramine Hcl] Other (See Comments)    Causes severe anxiety  . Benzamide Derivatives Other (See Comments)    blisters  . Benzocaine Hives and Other (See Comments)    blisters  . Lisinopril Cough      Review of Systems:  Constitutional: Feeling well today   HEENT: +headache, no vision change  Cardiac: No  chest pain, No  pressure, No palpitations  Respiratory:  No  shortness of breath. No  Cough  Gastrointestinal: No  abdominal pain, no change on bowel habits  Musculoskeletal: No new myalgia/arthralgia  Skin: No  Rash  Hem/Onc: No  easy bruising/bleeding, No  abnormal lumps/bumps  Neurologic: No  weakness, No  Dizziness  Psychiatric: No  concerns with depression, No  concerns with anxiety  Exam:  BP (!) 152/97 (BP Location: Right Arm)   Pulse 71   Temp 98.2 F (36.8 C) (Oral)   Wt 231 lb 0.6 oz (104.8 kg)   BMI 35.13 kg/m   Constitutional: VS see above. General Appearance: alert, well-developed, well-nourished, NAD  Eyes: Normal lids and conjunctive, non-icteric sclera  Ears, Nose, Mouth, Throat: MMM, Normal external inspection ears/nares/mouth/lips/gums.  Neck: No masses, trachea midline.   Respiratory: Normal respiratory effort. no wheeze, no rhonchi, no rales  Cardiovascular: S1/S2 normal, no murmur, no rub/gallop auscultated. RRR.   Musculoskeletal: Gait normal. Symmetric and independent movement of all extremities  Neurological: Normal balance/coordination. No tremor.  Skin: warm, dry, intact.   Psychiatric: Normal judgment/insight. Normal mood and affect. Oriented x3.     ASSESSMENT/PLAN:    Syncope, unspecified syncope type - No additional episodes since December 3 months ago, will still get carotid ultrasound for completeness. Consider cardiology referral.  Other migraine without status migrainosus, not intractable - Plan: SUMAtriptan (IMITREX) 100 MG tablet  Essential hypertension - Patient would rather hold off on medication adjustment, given last BP on file was normal and no symptoms, will follow       Follow-up plan: Return in about 3 months (around 06/30/2017) for recheck BP, sooner if needed .  Visit summary with medication list and pertinent instructions was printed for patient to review, alert [place] if any changes needed. All questions at time of visit were answered - patient instructed to contact office with any additional concerns. ER/RTC precautions were reviewed with the patient and understanding verbalized.    Please note: voice recognition software was used to produce this document, and typos may escape review. Please contact Dr. Lyn Hollingshead for any needed clarifications.

## 2017-04-01 ENCOUNTER — Other Ambulatory Visit: Payer: Self-pay

## 2017-04-01 DIAGNOSIS — R55 Syncope and collapse: Secondary | ICD-10-CM

## 2017-04-09 DIAGNOSIS — M47817 Spondylosis without myelopathy or radiculopathy, lumbosacral region: Secondary | ICD-10-CM | POA: Diagnosis not present

## 2017-04-09 DIAGNOSIS — M069 Rheumatoid arthritis, unspecified: Secondary | ICD-10-CM | POA: Diagnosis not present

## 2017-04-09 DIAGNOSIS — M47812 Spondylosis without myelopathy or radiculopathy, cervical region: Secondary | ICD-10-CM | POA: Diagnosis not present

## 2017-04-09 DIAGNOSIS — Z79891 Long term (current) use of opiate analgesic: Secondary | ICD-10-CM | POA: Diagnosis not present

## 2017-04-09 DIAGNOSIS — G894 Chronic pain syndrome: Secondary | ICD-10-CM | POA: Diagnosis not present

## 2017-04-19 ENCOUNTER — Other Ambulatory Visit: Payer: Self-pay | Admitting: Physical Medicine and Rehabilitation

## 2017-04-19 DIAGNOSIS — M545 Low back pain: Secondary | ICD-10-CM

## 2017-04-28 ENCOUNTER — Ambulatory Visit (HOSPITAL_COMMUNITY): Payer: Self-pay | Admitting: Psychiatry

## 2017-04-28 ENCOUNTER — Ambulatory Visit (HOSPITAL_BASED_OUTPATIENT_CLINIC_OR_DEPARTMENT_OTHER)
Admission: RE | Admit: 2017-04-28 | Discharge: 2017-04-28 | Disposition: A | Discharge status: Home / Self Care | Payer: Medicare Other | Source: Ambulatory Visit | Attending: Osteopathic Medicine | Admitting: Osteopathic Medicine

## 2017-04-28 DIAGNOSIS — Z8673 Personal history of transient ischemic attack (TIA), and cerebral infarction without residual deficits: Secondary | ICD-10-CM | POA: Diagnosis not present

## 2017-04-28 DIAGNOSIS — R55 Syncope and collapse: Secondary | ICD-10-CM | POA: Diagnosis not present

## 2017-04-28 NOTE — Progress Notes (Signed)
Echocardiogram 2D Echocardiogram has been performed.  [name] 04/28/2017, 3:27 PM

## 2017-04-28 NOTE — Progress Notes (Signed)
  Carotid artery duplex performed. Normal exam. Oriel Rumbold, Genene Churn 04/28/2017, 3:45 PM

## 2017-05-07 ENCOUNTER — Ambulatory Visit (INDEPENDENT_AMBULATORY_CARE_PROVIDER_SITE_OTHER): Payer: Medicare Other | Admitting: Psychiatry

## 2017-05-07 ENCOUNTER — Encounter (HOSPITAL_COMMUNITY): Payer: Self-pay | Admitting: Psychiatry

## 2017-05-07 VITALS — BP 130/88 | HR 67 | Ht 68.0 in | Wt 233.0 lb

## 2017-05-07 DIAGNOSIS — F331 Major depressive disorder, recurrent, moderate: Secondary | ICD-10-CM | POA: Diagnosis not present

## 2017-05-07 DIAGNOSIS — Z811 Family history of alcohol abuse and dependence: Secondary | ICD-10-CM

## 2017-05-07 DIAGNOSIS — G4700 Insomnia, unspecified: Secondary | ICD-10-CM | POA: Diagnosis not present

## 2017-05-07 DIAGNOSIS — M542 Cervicalgia: Secondary | ICD-10-CM

## 2017-05-07 DIAGNOSIS — F4323 Adjustment disorder with mixed anxiety and depressed mood: Secondary | ICD-10-CM

## 2017-05-07 DIAGNOSIS — F411 Generalized anxiety disorder: Secondary | ICD-10-CM | POA: Diagnosis not present

## 2017-05-07 MED ORDER — ESCITALOPRAM OXALATE 10 MG PO TABS: 10.0000 mg | ORAL_TABLET | Freq: Every day | ORAL | 3 refills | Status: DC

## 2017-05-07 NOTE — Progress Notes (Signed)
Patient ID: [name], male   DOB: [birth year], 47 y.o.   MRN: [id number] [facility] Follow-up Outpatient Visit  [name] [id number] 47 y.o.  05/07/2017  Chief Complaint: follow up for depression and anxiety.     History of Present Illness:     HPI Comments: [name] is a 47 y/o male with a past psychiatric history significant for symptoms of anxiety and depression. The patient was initially referred for psychiatric services for medication management.   Doing fair. holter monitor was negative. Has neck pain he will get checked out   Relationship going on fine he is married sleep is improved does not need the trazodone anxiety is manageable.   Modifying factors: relationship His disablity was approved 2016. Marland Kitchen Severity: Depression severity: not worse  No psychosis      Past Medical History:  Diagnosis Date  . Anxiety   . Arthritis   . Asthma    history [id number]  . Depression   . Essential hypertension 12/11/2011  . Gout   . Headache   . History of hiatal hernia   . History of lithotripsy   . Hypertension   . Kidney stones   . Lumbar degenerative disc disease 12/11/2011  . Nephrolithiasis 12/11/2011  . Pericarditis   . Renal tubular acidosis   . Rheumatoid arthritis(714.0)    Family History  Problem Relation Age of Onset  . Hypertension Father   . Alcohol abuse Father   . Heart attack Father 52  . Diabetes Mellitus II Father   . Coronary artery disease Father   . Diabetes Mellitus II Mother        Micah Flesher into a diabetic coma, per pt.  . Cancer - Colon Mother   . Coronary artery disease Mother   . Heart attack Mother 16  . Ovarian cancer Mother   . Uterine cancer Mother   . Heart attack Brother   . Coronary artery disease Brother   . Heart murmur Sister   . Dementia Neg Hx   . Bipolar disorder Neg Hx   . Depression Neg Hx   . Drug abuse Neg Hx   . OCD Neg Hx     Outpatient Encounter Medications as of 05/07/2017   Medication Sig  . Adalimumab (HUMIRA PEN [place]) Inject into the skin.  Marland Kitchen albuterol (PROVENTIL HFA;VENTOLIN HFA) 108 (90 Base) MCG/ACT inhaler Inhale 1-2 puffs into the lungs every 4 (four) hours as needed for wheezing or shortness of breath.  . AMBULATORY NON FORMULARY MEDICATION Medication Name: Gilmer Mor, use as needed.  Dx: Rheumatoid Arthritis  . amLODipine (NORVASC) 10 MG tablet One tablet by mouth every day for blood pressure control.  Marland Kitchen atorvastatin (LIPITOR) 40 MG tablet Take 1 tablet (40 mg total) by mouth daily.  . buprenorphine (BUTRANS) 5 MCG/HR PTWK patch Place 5 mcg onto the skin once a week.  . chlorthalidone (HYGROTON) 50 MG tablet Take 1 tablet (50 mg total) by mouth daily.  . cyclobenzaprine (FLEXERIL) 10 MG tablet Take 1 tablet (10 mg total) by mouth 3 (three) times daily as needed for muscle spasms.  Marland Kitchen escitalopram (LEXAPRO) 10 MG tablet Take 1 tablet (10 mg total) by mouth daily.  . fluticasone furoate-vilanterol (BREO ELLIPTA) 200-25 MCG/INH AEPB Inhale 1 puff into the lungs daily.  . meclizine (ANTIVERT) 25 MG tablet Take 1 tablet (25 mg total) by mouth 3 (three) times daily as needed for dizziness.  . methotrexate (RHEUMATREX) 2.5 MG tablet Take 20 mg by mouth  once a week. Caution:Chemotherapy. Protect from light.  . pantoprazole (PROTONIX) 40 MG tablet Take 1 tablet (40 mg total) by mouth daily.  . pregabalin (LYRICA) 50 MG capsule Take 1 capsule (50 mg total) by mouth daily.  . promethazine (PHENERGAN) 25 MG tablet Take 1 tablet (25 mg total) by mouth every 6 (six) hours as needed for nausea or vomiting.  . SUMAtriptan (IMITREX) 100 MG tablet Take 0.5 tablets (50 mg total) by mouth every 2 (two) hours as needed for migraine. May repeat in 2 hours if headache persists or recurs.  . [DISCONTINUED] escitalopram (LEXAPRO) 10 MG tablet Take 1 tablet (10 mg total) by mouth daily.  . vitamin C (ASCORBIC ACID) 500 MG tablet Take 1 tablet (500 mg total) by mouth daily. (Patient not  taking: Reported on 03/30/2017)  . [DISCONTINUED] FLUoxetine (PROZAC) 40 MG capsule Take 1 capsule (40 mg total) by mouth 2 (two) times daily.  . [DISCONTINUED] traZODone (DESYREL) 100 MG tablet Take 1 tablet (100 mg total) by mouth at bedtime as needed for sleep.   No facility-administered encounter medications on file as of 05/07/2017.     No results found for this or any previous visit (from the past 2160 hour(s)).  BP 130/88 (BP Location: Left Arm, Patient Position: Sitting, Cuff Size: Normal)   Pulse 67   Ht 5\' 8"  (1.727 m)   Wt 233 lb (105.7 kg)   BMI 35.43 kg/m    Review of Systems  Constitutional: Negative for fever.  Cardiovascular: Negative for chest pain.  Musculoskeletal: Positive for neck pain.  Skin: Negative for rash.  Neurological: Negative for headaches.  Psychiatric/Behavioral: Negative for depression and suicidal ideas.    Mental Status Examination  Appearance: casual and cooperative Alert: Yes Attention: fair  Cooperative: Yes Eye Contact: Good Speech: coherent Psychomotor Activity: Normal Memory/Concentration: adequate Oriented: person, place, time/date and situation Mood: fair Affect: congruent Thought Processes and Associations: Coherent Fund of Knowledge: Fair Thought Content: Suicidal ideation and Homicidal ideation were denied. Denies hallucinations Insight: Fair Judgement: Fair  Diagnosis: Major depressive disorder recurrent moderate to severe. Generalized anxiety disorder. Insomnia. 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 Continue lexapro Insomnia:baseline. Not worse. Not on trazadone now Anxiety : manageable on lexapro Pain effects mood   Medical complexity: pain can effect mood. On pain meds. Reviewed concerns. Fu 3 months   , MD

## 2017-05-11 DIAGNOSIS — M47817 Spondylosis without myelopathy or radiculopathy, lumbosacral region: Secondary | ICD-10-CM | POA: Diagnosis not present

## 2017-05-11 DIAGNOSIS — M069 Rheumatoid arthritis, unspecified: Secondary | ICD-10-CM | POA: Diagnosis not present

## 2017-05-11 DIAGNOSIS — G894 Chronic pain syndrome: Secondary | ICD-10-CM | POA: Diagnosis not present

## 2017-05-11 DIAGNOSIS — M47812 Spondylosis without myelopathy or radiculopathy, cervical region: Secondary | ICD-10-CM | POA: Diagnosis not present

## 2017-05-16 ENCOUNTER — Ambulatory Visit
Admission: RE | Admit: 2017-05-16 | Discharge: 2017-05-16 | Disposition: A | Discharge status: Home / Self Care | Payer: Medicare Other | Source: Ambulatory Visit | Attending: Physical Medicine and Rehabilitation | Admitting: Physical Medicine and Rehabilitation

## 2017-05-16 ENCOUNTER — Other Ambulatory Visit: Payer: Self-pay

## 2017-05-16 DIAGNOSIS — M545 Low back pain: Secondary | ICD-10-CM

## 2017-05-16 DIAGNOSIS — M5416 Radiculopathy, lumbar region: Secondary | ICD-10-CM | POA: Diagnosis not present

## 2017-06-08 DIAGNOSIS — M47817 Spondylosis without myelopathy or radiculopathy, lumbosacral region: Secondary | ICD-10-CM | POA: Diagnosis not present

## 2017-06-08 DIAGNOSIS — M47812 Spondylosis without myelopathy or radiculopathy, cervical region: Secondary | ICD-10-CM | POA: Diagnosis not present

## 2017-06-08 DIAGNOSIS — M069 Rheumatoid arthritis, unspecified: Secondary | ICD-10-CM | POA: Diagnosis not present

## 2017-06-08 DIAGNOSIS — G894 Chronic pain syndrome: Secondary | ICD-10-CM | POA: Diagnosis not present

## 2017-06-23 ENCOUNTER — Other Ambulatory Visit: Payer: Self-pay | Admitting: Physical Medicine and Rehabilitation

## 2017-06-23 DIAGNOSIS — M5412 Radiculopathy, cervical region: Secondary | ICD-10-CM

## 2017-06-30 ENCOUNTER — Ambulatory Visit: Payer: Self-pay | Admitting: Osteopathic Medicine

## 2017-07-07 DIAGNOSIS — M069 Rheumatoid arthritis, unspecified: Secondary | ICD-10-CM | POA: Diagnosis not present

## 2017-07-07 DIAGNOSIS — G894 Chronic pain syndrome: Secondary | ICD-10-CM | POA: Diagnosis not present

## 2017-07-07 DIAGNOSIS — M47812 Spondylosis without myelopathy or radiculopathy, cervical region: Secondary | ICD-10-CM | POA: Diagnosis not present

## 2017-07-07 DIAGNOSIS — M47817 Spondylosis without myelopathy or radiculopathy, lumbosacral region: Secondary | ICD-10-CM | POA: Diagnosis not present

## 2017-07-28 ENCOUNTER — Ambulatory Visit (INDEPENDENT_AMBULATORY_CARE_PROVIDER_SITE_OTHER): Payer: Medicare Other | Admitting: Osteopathic Medicine

## 2017-07-28 ENCOUNTER — Encounter: Payer: Self-pay | Admitting: Osteopathic Medicine

## 2017-07-28 VITALS — BP 139/87 | HR 67 | Temp 97.8°F | Wt 223.5 lb

## 2017-07-28 DIAGNOSIS — G43809 Other migraine, not intractable, without status migrainosus: Secondary | ICD-10-CM

## 2017-07-28 DIAGNOSIS — G894 Chronic pain syndrome: Secondary | ICD-10-CM | POA: Diagnosis not present

## 2017-07-28 DIAGNOSIS — R0602 Shortness of breath: Secondary | ICD-10-CM

## 2017-07-28 DIAGNOSIS — R110 Nausea: Secondary | ICD-10-CM

## 2017-07-28 DIAGNOSIS — I10 Essential (primary) hypertension: Secondary | ICD-10-CM | POA: Diagnosis not present

## 2017-07-28 DIAGNOSIS — Z800 Family history of malignant neoplasm of digestive organs: Secondary | ICD-10-CM | POA: Diagnosis not present

## 2017-07-28 MED ORDER — PROMETHAZINE HCL 25 MG PO TABS: 25.0000 mg | ORAL_TABLET | Freq: Four times a day (QID) | ORAL | 3 refills | Status: DC | PRN

## 2017-07-28 MED ORDER — ALBUTEROL SULFATE HFA 108 (90 BASE) MCG/ACT IN AERS: 1.0000 | INHALATION_SPRAY | RESPIRATORY_TRACT | 3 refills | Status: DC | PRN

## 2017-07-28 MED ORDER — CYCLOBENZAPRINE HCL 10 MG PO TABS: 10.0000 mg | ORAL_TABLET | Freq: Three times a day (TID) | ORAL | 5 refills | Status: DC | PRN

## 2017-07-28 MED ORDER — SUMATRIPTAN SUCCINATE 100 MG PO TABS: 50.0000 mg | ORAL_TABLET | ORAL | 3 refills | Status: DC | PRN

## 2017-07-28 MED ORDER — FLUTICASONE FUROATE-VILANTEROL 200-25 MCG/INH IN AEPB: 1.0000 | INHALATION_SPRAY | Freq: Every day | RESPIRATORY_TRACT | 11 refills | Status: DC

## 2017-07-28 NOTE — Progress Notes (Signed)
HPI: [name] is a 47 y.o. male who  has a past medical history of Anxiety, Arthritis, Asthma, Depression, Essential hypertension (12/11/2011), Gout, Headache, History of hiatal hernia, History of lithotripsy, Hypertension, Kidney stones, Lumbar degenerative disc disease (12/11/2011), Nephrolithiasis (12/11/2011), Pericarditis, Renal tubular acidosis, and Rheumatoid arthritis(714.0).  he presents to [facility] today, 07/28/17,  for chief complaint of:  BP recheck  BP: looks good today! He has lost weight. Is being better about diet/exercise.   No additional episodes of syncope or presyncope.   Overall, he is feeling really well today.  He needs refills on some medications.  Migraines: Needs refill on Imitrex, doing well on this medication as needed.  Chronic pain/RA: Managed by specialist.  Stable.  Patient needs Phenergan on occasion for nausea likely due to medications.  He also takes Lyrica really and Benzapril as needed.  COPD: Doing well on inhaled medications, not finding that he needs the rescue inhaler very often.  Moods: Following with psychiatry.  States he has enough Lexapro to last him a while.  Labs reviewed: has been awhile since routine screenings   (+)FH colon cancer in mom, diagnosed in her 52s, unsure exact age. Pt has never been screened.    Patient is accompanied by wife who assists with history-taking.   Past medical history, surgical history, and family history reviewed.  Current medication list and allergy/intolerance information reviewed.   (See remainder of HPI, ROS, Phys Exam below)  BP 139/87 (BP Location: Left Arm, Patient Position: Sitting, Cuff Size: Normal)   Pulse 67   Temp 97.8 F (36.6 C) (Oral)   Wt 223 lb 8 oz (101.4 kg)   BMI 33.98 kg/m     ASSESSMENT/PLAN:   Essential hypertension - Plan: CBC, COMPLETE METABOLIC PANEL WITH GFR, Lipid panel, TSH  SOB (shortness of breath) - Plan: albuterol  (PROVENTIL HFA;VENTOLIN HFA) 108 (90 Base) MCG/ACT inhaler, fluticasone furoate-vilanterol (BREO ELLIPTA) 200-25 MCG/INH AEPB  Chronic pain syndrome - Plan: cyclobenzaprine (FLEXERIL) 10 MG tablet  Nausea - Plan: promethazine (PHENERGAN) 25 MG tablet  Other migraine without status migrainosus, not intractable - Plan: SUMAtriptan (IMITREX) 100 MG tablet  Family history of colon cancer in mother - Plan: Ambulatory referral to Gastroenterology   Meds ordered this encounter  Medications  . albuterol (PROVENTIL HFA;VENTOLIN HFA) 108 (90 Base) MCG/ACT inhaler    Sig: Inhale 1-2 puffs into the lungs every 4 (four) hours as needed for wheezing or shortness of breath.    Dispense:  1 Inhaler    Refill:  3  . cyclobenzaprine (FLEXERIL) 10 MG tablet    Sig: Take 1 tablet (10 mg total) by mouth 3 (three) times daily as needed for muscle spasms.    Dispense:  90 tablet    Refill:  5  . fluticasone furoate-vilanterol (BREO ELLIPTA) 200-25 MCG/INH AEPB    Sig: Inhale 1 puff into the lungs daily.    Dispense:  60 each    Refill:  11  . promethazine (PHENERGAN) 25 MG tablet    Sig: Take 1 tablet (25 mg total) by mouth every 6 (six) hours as needed for nausea or vomiting.    Dispense:  60 tablet    Refill:  3  . SUMAtriptan (IMITREX) 100 MG tablet    Sig: Take 0.5 tablets (50 mg total) by mouth every 2 (two) hours as needed for migraine. May repeat in 2 hours if headache persists or recurs.    Dispense:  10 tablet  Refill:  3     Follow-up plan: Return in about 6 months (around 01/28/2018) for recheck blood pressure etc in 6 months - sooner if needed .     ############################################ ############################################ ############################################ ############################################    Outpatient Encounter Medications as of 07/28/2017  Medication Sig  . Adalimumab (HUMIRA PEN [place]) Inject into the skin.  Marland Kitchen albuterol (PROVENTIL  HFA;VENTOLIN HFA) 108 (90 Base) MCG/ACT inhaler Inhale 1-2 puffs into the lungs every 4 (four) hours as needed for wheezing or shortness of breath.  . AMBULATORY NON FORMULARY MEDICATION Medication Name: Gilmer Mor, use as needed.  Dx: Rheumatoid Arthritis  . amLODipine (NORVASC) 10 MG tablet One tablet by mouth every day for blood pressure control.  Marland Kitchen atorvastatin (LIPITOR) 40 MG tablet Take 1 tablet (40 mg total) by mouth daily.  . buprenorphine (BUTRANS - DOSED MCG/HR) 10 MCG/HR PTWK patch   . buprenorphine (BUTRANS) 5 MCG/HR PTWK patch Place 5 mcg onto the skin once a week.  . chlorthalidone (HYGROTON) 50 MG tablet Take 1 tablet (50 mg total) by mouth daily.  . cyclobenzaprine (FLEXERIL) 10 MG tablet Take 1 tablet (10 mg total) by mouth 3 (three) times daily as needed for muscle spasms.  Marland Kitchen escitalopram (LEXAPRO) 10 MG tablet Take 1 tablet (10 mg total) by mouth daily.  . fluticasone furoate-vilanterol (BREO ELLIPTA) 200-25 MCG/INH AEPB Inhale 1 puff into the lungs daily.  . meclizine (ANTIVERT) 25 MG tablet Take 1 tablet (25 mg total) by mouth 3 (three) times daily as needed for dizziness.  . methotrexate (RHEUMATREX) 2.5 MG tablet Take 20 mg by mouth once a week. Caution:Chemotherapy. Protect from light.  . pantoprazole (PROTONIX) 40 MG tablet Take 1 tablet (40 mg total) by mouth daily.  . pregabalin (LYRICA) 50 MG capsule Take 1 capsule (50 mg total) by mouth daily.  . promethazine (PHENERGAN) 25 MG tablet Take 1 tablet (25 mg total) by mouth every 6 (six) hours as needed for nausea or vomiting.  . SUMAtriptan (IMITREX) 100 MG tablet Take 0.5 tablets (50 mg total) by mouth every 2 (two) hours as needed for migraine. May repeat in 2 hours if headache persists or recurs.  . vitamin C (ASCORBIC ACID) 500 MG tablet Take 1 tablet (500 mg total) by mouth daily.  . [DISCONTINUED] FLUoxetine (PROZAC) 40 MG capsule Take 1 capsule (40 mg total) by mouth 2 (two) times daily.  . [DISCONTINUED] traZODone  (DESYREL) 100 MG tablet Take 1 tablet (100 mg total) by mouth at bedtime as needed for sleep.   No facility-administered encounter medications on file as of 07/28/2017.    Allergies  Allergen Reactions  . Benadryl [Diphenhydramine Hcl] Other (See Comments)    Causes severe anxiety  . Benzamide Derivatives Other (See Comments)    blisters   . Benzocaine Hives and Other (See Comments)    blisters  . Lisinopril Cough  . Other Other (See Comments)      Review of Systems:  Constitutional: No recent illness  HEENT: No  headache, no vision change  Cardiac: No  chest pain, No  pressure, No palpitations  Respiratory:  No  shortness of breath. No  Cough  Gastrointestinal: No  abdominal pain, no change on bowel habits  Musculoskeletal: No new myalgia/arthralgia  Skin: No  Rash  Hem/Onc: No  easy bruising/bleeding, No  abnormal lumps/bumps  Neurologic: No  weakness, No  Dizziness  Psychiatric: No  concerns with depression, No  concerns with anxiety  Exam:  BP 139/87 (BP Location: Left  Arm, Patient Position: Sitting, Cuff Size: Normal)   Pulse 67   Temp 97.8 F (36.6 C) (Oral)   Wt 223 lb 8 oz (101.4 kg)   BMI 33.98 kg/m   Constitutional: VS see above. General Appearance: alert, well-developed, well-nourished, NAD  Eyes: Normal lids and conjunctive, non-icteric sclera  Ears, Nose, Mouth, Throat: MMM, Normal external inspection ears/nares/mouth/lips/gums.  Neck: No masses, trachea midline.   Respiratory: Normal respiratory effort. no wheeze, no rhonchi, no rales  Cardiovascular: S1/S2 normal, no murmur, no rub/gallop auscultated. RRR.   Musculoskeletal: Gait normal. Symmetric and independent movement of all extremities  Neurological: Normal balance/coordination. No tremor.  Skin: warm, dry, intact.   Psychiatric: Normal judgment/insight. Normal mood and affect. Oriented x3.   Visit summary with medication list and pertinent instructions was printed for patient to  review, advised to alert [place] if any changes needed. All questions at time of visit were answered - patient instructed to contact office with any additional concerns. ER/RTC precautions were reviewed with the patient and understanding verbalized.   Follow-up plan: Return in about 6 months (around 01/28/2018) for recheck blood pressure etc in 6 months - sooner if needed .  Note: Total time spent 25 minutes, greater than 50% of the visit was spent face-to-face counseling and coordinating care for the following: The primary encounter diagnosis was Essential hypertension. Diagnoses of SOB (shortness of breath), Chronic pain syndrome, Nausea, Other migraine without status migrainosus, not intractable, and Family history of colon cancer in mother were also pertinent to this visit.Marland Kitchen  Please note: voice recognition software was used to produce this document, and typos may escape review. Please contact Dr. Lyn Hollingshead for any needed clarifications.

## 2017-08-04 DIAGNOSIS — M47817 Spondylosis without myelopathy or radiculopathy, lumbosacral region: Secondary | ICD-10-CM | POA: Diagnosis not present

## 2017-08-04 DIAGNOSIS — G894 Chronic pain syndrome: Secondary | ICD-10-CM | POA: Diagnosis not present

## 2017-08-04 DIAGNOSIS — M069 Rheumatoid arthritis, unspecified: Secondary | ICD-10-CM | POA: Diagnosis not present

## 2017-08-04 DIAGNOSIS — M47812 Spondylosis without myelopathy or radiculopathy, cervical region: Secondary | ICD-10-CM | POA: Diagnosis not present

## 2017-08-30 ENCOUNTER — Encounter: Payer: Self-pay | Admitting: Osteopathic Medicine

## 2017-08-30 ENCOUNTER — Ambulatory Visit (INDEPENDENT_AMBULATORY_CARE_PROVIDER_SITE_OTHER): Payer: Medicare Other | Admitting: Psychiatry

## 2017-08-30 ENCOUNTER — Other Ambulatory Visit: Payer: Self-pay

## 2017-08-30 ENCOUNTER — Encounter (HOSPITAL_COMMUNITY): Payer: Self-pay | Admitting: Psychiatry

## 2017-08-30 VITALS — BP 158/96 | HR 66 | Ht 68.0 in | Wt 227.0 lb

## 2017-08-30 DIAGNOSIS — F331 Major depressive disorder, recurrent, moderate: Secondary | ICD-10-CM | POA: Diagnosis not present

## 2017-08-30 DIAGNOSIS — F411 Generalized anxiety disorder: Secondary | ICD-10-CM

## 2017-08-30 DIAGNOSIS — F4323 Adjustment disorder with mixed anxiety and depressed mood: Secondary | ICD-10-CM | POA: Diagnosis not present

## 2017-08-30 MED ORDER — ESCITALOPRAM OXALATE 10 MG PO TABS: 10.0000 mg | ORAL_TABLET | Freq: Every day | ORAL | 3 refills | Status: DC

## 2017-08-30 NOTE — Progress Notes (Signed)
Patient ID: [name], male   DOB: Jan 20, 1971, 47 y.o.   MRN: [id number] [facility] Follow-up Outpatient Visit  [name] [id number] 47 y.o.  08/30/2017  Chief Complaint: follow up for depression and anxiety.     History of Present Illness:     HPI Comments: [name] is a 47 y/o male with a past psychiatric history significant for symptoms of anxiety and depression. The patient was initially referred for psychiatric services for medication management.   Doing fair with meds. Relationship is fair  Modifying factors: relationship His disablity was approved 2016. Marland Kitchen Severity: Depression; better  No psychosis      Past Medical History:  Diagnosis Date  . Anxiety   . Arthritis   . Asthma    history [id number]  . Depression   . Essential hypertension 12/11/2011  . Gout   . Headache   . History of hiatal hernia   . History of lithotripsy   . Hypertension   . Kidney stones   . Lumbar degenerative disc disease 12/11/2011  . Nephrolithiasis 12/11/2011  . Pericarditis   . Renal tubular acidosis   . Rheumatoid arthritis(714.0)    Family History  Problem Relation Age of Onset  . Hypertension Father   . Alcohol abuse Father   . Heart attack Father 23  . Diabetes Mellitus II Father   . Coronary artery disease Father   . Diabetes Mellitus II Mother        [name] into a diabetic coma, per pt.  . Cancer - Colon Mother   . Coronary artery disease Mother   . Heart attack Mother 39  . Ovarian cancer Mother   . Uterine cancer Mother   . Heart attack Brother   . Coronary artery disease Brother   . Heart murmur Sister   . Dementia Neg Hx   . Bipolar disorder Neg Hx   . Depression Neg Hx   . Drug abuse Neg Hx   . OCD Neg Hx     Outpatient Encounter Medications as of 08/30/2017  Medication Sig  . Adalimumab (HUMIRA PEN [place]) Inject into the skin.  Marland Kitchen albuterol (PROVENTIL HFA;VENTOLIN HFA) 108 (90 Base) MCG/ACT inhaler Inhale 1-2 puffs into the lungs  every 4 (four) hours as needed for wheezing or shortness of breath.  . AMBULATORY NON FORMULARY MEDICATION Medication Name: Gilmer Mor, use as needed.  Dx: Rheumatoid Arthritis  . amLODipine (NORVASC) 10 MG tablet One tablet by mouth every day for blood pressure control.  Marland Kitchen atorvastatin (LIPITOR) 40 MG tablet Take 1 tablet (40 mg total) by mouth daily.  . buprenorphine (BUTRANS - DOSED MCG/HR) 10 MCG/HR PTWK patch   . buprenorphine (BUTRANS) 5 MCG/HR PTWK patch Place 5 mcg onto the skin once a week.  . chlorthalidone (HYGROTON) 50 MG tablet Take 1 tablet (50 mg total) by mouth daily.  . cyclobenzaprine (FLEXERIL) 10 MG tablet Take 1 tablet (10 mg total) by mouth 3 (three) times daily as needed for muscle spasms.  Marland Kitchen escitalopram (LEXAPRO) 10 MG tablet Take 1 tablet (10 mg total) by mouth daily.  . fluticasone furoate-vilanterol (BREO ELLIPTA) 200-25 MCG/INH AEPB Inhale 1 puff into the lungs daily.  . methotrexate (RHEUMATREX) 2.5 MG tablet Take 20 mg by mouth once a week. Caution:Chemotherapy. Protect from light.  . pantoprazole (PROTONIX) 40 MG tablet Take 1 tablet (40 mg total) by mouth daily.  . pregabalin (LYRICA) 50 MG capsule Take 1 capsule (50 mg total) by mouth daily.  Marland Kitchen  promethazine (PHENERGAN) 25 MG tablet Take 1 tablet (25 mg total) by mouth every 6 (six) hours as needed for nausea or vomiting.  . SUMAtriptan (IMITREX) 100 MG tablet Take 0.5 tablets (50 mg total) by mouth every 2 (two) hours as needed for migraine. May repeat in 2 hours if headache persists or recurs.  . vitamin C (ASCORBIC ACID) 500 MG tablet Take 1 tablet (500 mg total) by mouth daily.  . [DISCONTINUED] escitalopram (LEXAPRO) 10 MG tablet Take 1 tablet (10 mg total) by mouth daily.  . [DISCONTINUED] FLUoxetine (PROZAC) 40 MG capsule Take 1 capsule (40 mg total) by mouth 2 (two) times daily.  . [DISCONTINUED] traZODone (DESYREL) 100 MG tablet Take 1 tablet (100 mg total) by mouth at bedtime as needed for sleep.   No  facility-administered encounter medications on file as of 08/30/2017.     No results found for this or any previous visit (from the past 2160 hour(s)).  BP (!) 158/96 (BP Location: Left Arm, Patient Position: Sitting, Cuff Size: Normal)   Pulse 66   Ht 5\' 8"  (1.727 m)   Wt 227 lb (103 kg)   BMI 34.52 kg/m    Review of Systems  Constitutional: Negative for fever.  Cardiovascular: Negative for chest pain.  Musculoskeletal: Positive for neck pain.  Skin: Negative for rash.  Neurological: Negative for headaches.  Psychiatric/Behavioral: Negative for depression and suicidal ideas.    Mental Status Examination  Appearance: casual and cooperative Alert: Yes Attention: fair  Cooperative: Yes Eye Contact: Good Speech: coherent Psychomotor Activity: Normal Memory/Concentration: adequate Oriented: person, place, time/date and situation Mood: fair Affect: congruent Thought Processes and Associations: Coherent Fund of Knowledge: Fair Thought Content: Suicidal ideation and Homicidal ideation were denied. Denies hallucinations Insight: Fair Judgement: Fair  Diagnosis: Major depressive disorder recurrent moderate to severe. Generalized anxiety disorder. Insomnia. 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 Continue lexapro  Insomnia:baseline. Not worse. Not on trazadone now Anxiety : manageable on lexapro Pain effects mood   Medical complexity: pain can effect mood. On pain meds. Reviewed concerns. Fu 3 months   , MD

## 2017-09-08 DIAGNOSIS — M47817 Spondylosis without myelopathy or radiculopathy, lumbosacral region: Secondary | ICD-10-CM | POA: Diagnosis not present

## 2017-09-08 DIAGNOSIS — M069 Rheumatoid arthritis, unspecified: Secondary | ICD-10-CM | POA: Diagnosis not present

## 2017-09-08 DIAGNOSIS — M47812 Spondylosis without myelopathy or radiculopathy, cervical region: Secondary | ICD-10-CM | POA: Diagnosis not present

## 2017-09-08 DIAGNOSIS — G894 Chronic pain syndrome: Secondary | ICD-10-CM | POA: Diagnosis not present

## 2017-10-07 DIAGNOSIS — G894 Chronic pain syndrome: Secondary | ICD-10-CM | POA: Diagnosis not present

## 2017-10-07 DIAGNOSIS — Z79891 Long term (current) use of opiate analgesic: Secondary | ICD-10-CM | POA: Diagnosis not present

## 2017-10-07 DIAGNOSIS — M069 Rheumatoid arthritis, unspecified: Secondary | ICD-10-CM | POA: Diagnosis not present

## 2017-10-07 DIAGNOSIS — M47817 Spondylosis without myelopathy or radiculopathy, lumbosacral region: Secondary | ICD-10-CM | POA: Diagnosis not present

## 2017-10-07 DIAGNOSIS — M47812 Spondylosis without myelopathy or radiculopathy, cervical region: Secondary | ICD-10-CM | POA: Diagnosis not present

## 2017-10-19 ENCOUNTER — Ambulatory Visit
Admission: RE | Admit: 2017-10-19 | Discharge: 2017-10-19 | Disposition: A | Discharge status: Home / Self Care | Payer: Medicare Other | Source: Ambulatory Visit | Attending: Physical Medicine and Rehabilitation | Admitting: Physical Medicine and Rehabilitation

## 2017-10-19 DIAGNOSIS — M4802 Spinal stenosis, cervical region: Secondary | ICD-10-CM | POA: Diagnosis not present

## 2017-10-19 DIAGNOSIS — M5412 Radiculopathy, cervical region: Secondary | ICD-10-CM

## 2017-11-05 DIAGNOSIS — M069 Rheumatoid arthritis, unspecified: Secondary | ICD-10-CM | POA: Diagnosis not present

## 2017-11-05 DIAGNOSIS — M47817 Spondylosis without myelopathy or radiculopathy, lumbosacral region: Secondary | ICD-10-CM | POA: Diagnosis not present

## 2017-11-05 DIAGNOSIS — M47812 Spondylosis without myelopathy or radiculopathy, cervical region: Secondary | ICD-10-CM | POA: Diagnosis not present

## 2017-11-05 DIAGNOSIS — G894 Chronic pain syndrome: Secondary | ICD-10-CM | POA: Diagnosis not present

## 2017-11-22 ENCOUNTER — Other Ambulatory Visit: Payer: Self-pay | Admitting: Neurological Surgery

## 2017-11-22 DIAGNOSIS — M5412 Radiculopathy, cervical region: Secondary | ICD-10-CM | POA: Diagnosis not present

## 2017-11-23 NOTE — Pre-Procedure Instructions (Signed)
[name]  11/23/2017      [facility] [address], [place] - [address] [place] [address] Phone: [phone] Fax: [phone]    Your procedure is scheduled on Thursday October 31st.  Report to [facility] Admitting at 8:30 A.M.  Call this number if you have problems the morning of surgery:  [phone]   Remember:  Do not eat or drink after midnight.    Take these medicines the morning of surgery with A SIP OF WATER  pantoprazole (PROTONIX) fluticasone furoate-vilanterol (BREO ELLIPTA) albuterol (PROVENTIL HFA;VENTOLIN HFA) if needed- bring inhaler with you to the hospital cyclobenzaprine (FLEXERIL) if needed promethazine (PHENERGAN) if needed SUMAtriptan (IMITREX) if needed   7 days prior to surgery STOP taking any Aspirin(unless otherwise instructed by your surgeon), Aleve, Naproxen, Ibuprofen, Motrin, Advil, Goody's, BC's, all herbal medications, fish oil, and all vitamins     Do not wear jewelry  Do not wear lotions, powders, or colognes, or deodorant.  Do not shave 48 hours prior to surgery.  Men may shave face and neck.  Do not bring valuables to the hospital.  [facility] is not responsible for any belongings or valuables.  Contacts, eyeglasses, hearing aids, dentures or bridgework may not be worn into surgery.  Leave your suitcase in the car.  After surgery it may be brought to your room.  For patients admitted to the hospital, discharge time will be determined by your treatment team.  Patients discharged the day of surgery will not be allowed to drive home.   - Preparing For Surgery  Before surgery, you can play an important role. Because skin is not sterile, your skin needs to be as free of germs as possible. You can reduce the number of germs on your skin by washing with CHG (chlorahexidine gluconate) Soap before surgery.  CHG is an antiseptic cleaner which kills germs and bonds with the skin to  continue killing germs even after washing.    Oral Hygiene is also important to reduce your risk of infection.  Remember - BRUSH YOUR TEETH THE MORNING OF SURGERY WITH YOUR REGULAR TOOTHPASTE  Please do not use if you have an allergy to CHG or antibacterial soaps. If your skin becomes reddened/irritated stop using the CHG.  Do not shave (including legs and underarms) for at least 48 hours prior to first CHG shower. It is OK to shave your face.  Please follow these instructions carefully.   1. Shower the NIGHT BEFORE SURGERY and the MORNING OF SURGERY with CHG.   2. If you chose to wash your hair, wash your hair first as usual with your normal shampoo.  3. After you shampoo, rinse your hair and body thoroughly to remove the shampoo.  4. Use CHG as you would any other liquid soap. You can apply CHG directly to the skin and wash gently with a scrungie or a clean washcloth.   5. Apply the CHG Soap to your body ONLY FROM THE NECK DOWN.  Do not use on open wounds or open sores. Avoid contact with your eyes, ears, mouth and genitals (private parts). Wash Face and genitals (private parts)  with your normal soap.  6. Wash thoroughly, paying special attention to the area where your surgery will be performed.  7. Thoroughly rinse your body with warm water from the neck down.  8. DO NOT shower/wash with your normal soap after using and rinsing off the CHG  Soap.  9. Pat yourself dry with a CLEAN TOWEL.  10. Wear CLEAN PAJAMAS to bed the night before surgery, wear comfortable clothes the morning of surgery  11. Place CLEAN SHEETS on your bed the night of your first shower and DO NOT SLEEP WITH PETS.    Day of Surgery: Shower as stated above. Do not apply any deodorants/lotions.  Please wear clean clothes to the hospital/surgery center.   Remember to brush your teeth WITH YOUR REGULAR TOOTHPASTE.  Please read over the following fact sheets that you were given.

## 2017-11-24 ENCOUNTER — Encounter (HOSPITAL_COMMUNITY): Payer: Self-pay

## 2017-11-24 ENCOUNTER — Other Ambulatory Visit: Payer: Self-pay

## 2017-11-24 ENCOUNTER — Encounter (HOSPITAL_COMMUNITY)
Admission: RE | Admit: 2017-11-24 | Discharge: 2017-11-24 | Disposition: A | Discharge status: Home / Self Care | Payer: Medicare Other | Source: Ambulatory Visit | Attending: Neurological Surgery | Admitting: Neurological Surgery

## 2017-11-24 DIAGNOSIS — Z79899 Other long term (current) drug therapy: Secondary | ICD-10-CM | POA: Diagnosis not present

## 2017-11-24 DIAGNOSIS — J45909 Unspecified asthma, uncomplicated: Secondary | ICD-10-CM | POA: Diagnosis not present

## 2017-11-24 DIAGNOSIS — F329 Major depressive disorder, single episode, unspecified: Secondary | ICD-10-CM | POA: Diagnosis not present

## 2017-11-24 DIAGNOSIS — Z888 Allergy status to other drugs, medicaments and biological substances status: Secondary | ICD-10-CM | POA: Diagnosis not present

## 2017-11-24 DIAGNOSIS — F419 Anxiety disorder, unspecified: Secondary | ICD-10-CM | POA: Diagnosis not present

## 2017-11-24 DIAGNOSIS — N2589 Other disorders resulting from impaired renal tubular function: Secondary | ICD-10-CM | POA: Diagnosis not present

## 2017-11-24 DIAGNOSIS — M4802 Spinal stenosis, cervical region: Secondary | ICD-10-CM | POA: Diagnosis not present

## 2017-11-24 DIAGNOSIS — M109 Gout, unspecified: Secondary | ICD-10-CM | POA: Diagnosis not present

## 2017-11-24 DIAGNOSIS — M47812 Spondylosis without myelopathy or radiculopathy, cervical region: Secondary | ICD-10-CM | POA: Diagnosis not present

## 2017-11-24 DIAGNOSIS — M069 Rheumatoid arthritis, unspecified: Secondary | ICD-10-CM | POA: Diagnosis not present

## 2017-11-24 DIAGNOSIS — M50223 Other cervical disc displacement at C6-C7 level: Secondary | ICD-10-CM | POA: Diagnosis not present

## 2017-11-24 DIAGNOSIS — F17210 Nicotine dependence, cigarettes, uncomplicated: Secondary | ICD-10-CM | POA: Diagnosis not present

## 2017-11-24 DIAGNOSIS — Z01812 Encounter for preprocedural laboratory examination: Secondary | ICD-10-CM

## 2017-11-24 DIAGNOSIS — I10 Essential (primary) hypertension: Secondary | ICD-10-CM | POA: Diagnosis not present

## 2017-11-24 LAB — CBC WITH DIFFERENTIAL/PLATELET
Abs Immature Granulocytes: 0.03 10*3/uL (ref 0.00–0.07)
Basophils Absolute: 0 10*3/uL (ref 0.0–0.1)
Basophils Relative: 1 %
Eosinophils Absolute: 0.2 10*3/uL (ref 0.0–0.5)
Eosinophils Relative: 2 %
HCT: 49.1 % (ref 39.0–52.0)
Hemoglobin: 15.9 g/dL (ref 13.0–17.0)
Immature Granulocytes: 0 %
Lymphocytes Relative: 31 %
Lymphs Abs: 2.8 10*3/uL (ref 0.7–4.0)
MCH: 28.8 pg (ref 26.0–34.0)
MCHC: 32.4 g/dL (ref 30.0–36.0)
MCV: 88.8 fL (ref 80.0–100.0)
Monocytes Absolute: 0.8 10*3/uL (ref 0.1–1.0)
Monocytes Relative: 9 %
Neutro Abs: 5 10*3/uL (ref 1.7–7.7)
Neutrophils Relative %: 57 %
Platelets: 338 10*3/uL (ref 150–400)
RBC: 5.53 MIL/uL (ref 4.22–5.81)
RDW: 13.3 % (ref 11.5–15.5)
WBC: 8.8 10*3/uL (ref 4.0–10.5)
nRBC: 0 % (ref 0.0–0.2)

## 2017-11-24 LAB — BASIC METABOLIC PANEL
Anion gap: 7 (ref 5–15)
BUN: 8 mg/dL (ref 6–20)
CO2: 21 mmol/L — ABNORMAL LOW (ref 22–32)
Calcium: 9.6 mg/dL (ref 8.9–10.3)
Chloride: 109 mmol/L (ref 98–111)
Creatinine, Ser: 0.86 mg/dL (ref 0.61–1.24)
GFR calc Af Amer: >60 mL/min (ref >60)
GFR calc non Af Amer: >60 mL/min (ref >60)
Glucose, Bld: 106 mg/dL — ABNORMAL HIGH (ref 70–99)
Potassium: 4.2 mmol/L (ref 3.5–5.1)
Sodium: 137 mmol/L (ref 135–145)

## 2017-11-24 LAB — PROTIME-INR
INR: 0.99
Prothrombin Time: 13 seconds (ref 11.4–15.2)

## 2017-11-24 LAB — SURGICAL PCR SCREEN
MRSA, PCR: NEGATIVE
Staphylococcus aureus: NEGATIVE

## 2017-11-24 NOTE — Progress Notes (Signed)
PCP - Dr. Elwin Mocha  Cardiologist - Denies  Chest x-ray - 01/06/17 (E)  EKG - 01/06/17 (E)  Stress Test - Denies  ECHO - 04/28/17 (E)  Cardiac Cath - Denies  AICD- na PM- na LOOP- na  Sleep Study - Yes- neg CPAP - None  LABS- 11/24/17: CBC,w/D, BMP, PT, PCR  ASA- Denies   Anesthesia- No  Pt denies having chest pain, sob, or fever at this time. All instructions explained to the pt, with a verbal understanding of the material. Pt agrees to go over the instructions while at home for a better understanding. The opportunity to ask questions was provided.

## 2017-11-25 ENCOUNTER — Inpatient Hospital Stay (HOSPITAL_COMMUNITY): Payer: Medicare Other

## 2017-11-25 ENCOUNTER — Ambulatory Visit (HOSPITAL_COMMUNITY): Payer: Self-pay | Admitting: Psychiatry

## 2017-11-25 ENCOUNTER — Encounter (HOSPITAL_COMMUNITY): Payer: Self-pay

## 2017-11-25 ENCOUNTER — Observation Stay (HOSPITAL_COMMUNITY)
Admission: RE | Admit: 2017-11-25 | Discharge: 2017-11-26 | Disposition: A | Discharge status: Home / Self Care | Payer: Medicare Other | Source: Ambulatory Visit | Attending: Neurological Surgery | Admitting: Neurological Surgery

## 2017-11-25 ENCOUNTER — Encounter (HOSPITAL_COMMUNITY)
Admission: RE | Disposition: A | Discharge status: Home / Self Care | Payer: Self-pay | Source: Ambulatory Visit | Attending: Neurological Surgery

## 2017-11-25 ENCOUNTER — Inpatient Hospital Stay (HOSPITAL_COMMUNITY): Payer: Medicare Other | Admitting: Anesthesiology

## 2017-11-25 DIAGNOSIS — Z79899 Other long term (current) drug therapy: Secondary | ICD-10-CM | POA: Diagnosis not present

## 2017-11-25 DIAGNOSIS — M47812 Spondylosis without myelopathy or radiculopathy, cervical region: Principal | ICD-10-CM | POA: Insufficient documentation

## 2017-11-25 DIAGNOSIS — Z981 Arthrodesis status: Secondary | ICD-10-CM | POA: Diagnosis not present

## 2017-11-25 DIAGNOSIS — F329 Major depressive disorder, single episode, unspecified: Secondary | ICD-10-CM | POA: Insufficient documentation

## 2017-11-25 DIAGNOSIS — M109 Gout, unspecified: Secondary | ICD-10-CM | POA: Diagnosis not present

## 2017-11-25 DIAGNOSIS — F17210 Nicotine dependence, cigarettes, uncomplicated: Secondary | ICD-10-CM | POA: Diagnosis not present

## 2017-11-25 DIAGNOSIS — N2589 Other disorders resulting from impaired renal tubular function: Secondary | ICD-10-CM | POA: Diagnosis not present

## 2017-11-25 DIAGNOSIS — I10 Essential (primary) hypertension: Secondary | ICD-10-CM | POA: Insufficient documentation

## 2017-11-25 DIAGNOSIS — F419 Anxiety disorder, unspecified: Secondary | ICD-10-CM | POA: Diagnosis not present

## 2017-11-25 DIAGNOSIS — Z419 Encounter for procedure for purposes other than remedying health state, unspecified: Secondary | ICD-10-CM

## 2017-11-25 DIAGNOSIS — M50223 Other cervical disc displacement at C6-C7 level: Secondary | ICD-10-CM | POA: Insufficient documentation

## 2017-11-25 DIAGNOSIS — J45909 Unspecified asthma, uncomplicated: Secondary | ICD-10-CM | POA: Diagnosis not present

## 2017-11-25 DIAGNOSIS — M069 Rheumatoid arthritis, unspecified: Secondary | ICD-10-CM | POA: Diagnosis not present

## 2017-11-25 DIAGNOSIS — M4802 Spinal stenosis, cervical region: Secondary | ICD-10-CM | POA: Diagnosis not present

## 2017-11-25 DIAGNOSIS — Z888 Allergy status to other drugs, medicaments and biological substances status: Secondary | ICD-10-CM | POA: Insufficient documentation

## 2017-11-25 DIAGNOSIS — M4322 Fusion of spine, cervical region: Secondary | ICD-10-CM | POA: Diagnosis present

## 2017-11-25 HISTORY — PX: ANTERIOR CERVICAL DECOMP/DISCECTOMY FUSION: SHX1161

## 2017-11-25 SURGERY — ANTERIOR CERVICAL DECOMPRESSION/DISCECTOMY FUSION 1 LEVEL/HARDWARE REMOVAL
Anesthesia: General

## 2017-11-25 MED ORDER — BUPIVACAINE HCL (PF) 0.25 % IJ SOLN
INTRAMUSCULAR | Status: AC
  Filled 2017-11-25: qty 30

## 2017-11-25 MED ORDER — FLUTICASONE FUROATE-VILANTEROL 200-25 MCG/INH IN AEPB
1.0000 | INHALATION_SPRAY | Freq: Every day | RESPIRATORY_TRACT | Status: DC
  Filled 2017-11-25: qty 28

## 2017-11-25 MED ORDER — CYCLOBENZAPRINE HCL 10 MG PO TABS: 10.0000 mg | ORAL_TABLET | Freq: Three times a day (TID) | ORAL | Status: DC | PRN

## 2017-11-25 MED ORDER — OXYCODONE HCL 5 MG PO TABS: 5.0000 mg | ORAL_TABLET | Freq: Four times a day (QID) | ORAL | 0 refills | Status: DC | PRN

## 2017-11-25 MED ORDER — ONDANSETRON HCL 4 MG/2ML IJ SOLN
INTRAMUSCULAR | Status: DC | PRN
  Administered 2017-11-25: 4 mg via INTRAVENOUS

## 2017-11-25 MED ORDER — OXYCODONE HCL 5 MG PO TABS
5.0000 mg | ORAL_TABLET | ORAL | Status: DC | PRN
  Administered 2017-11-25: 5 mg via ORAL
  Filled 2017-11-25: qty 1

## 2017-11-25 MED ORDER — ALBUTEROL SULFATE (2.5 MG/3ML) 0.083% IN NEBU: 2.5000 mg | INHALATION_SOLUTION | RESPIRATORY_TRACT | Status: DC | PRN

## 2017-11-25 MED ORDER — CHLORHEXIDINE GLUCONATE CLOTH 2 % EX PADS: 6.0000 | MEDICATED_PAD | Freq: Once | CUTANEOUS | Status: DC

## 2017-11-25 MED ORDER — FENTANYL CITRATE (PF) 100 MCG/2ML IJ SOLN
INTRAMUSCULAR | Status: DC | PRN
  Administered 2017-11-25 (×2): 50 ug via INTRAVENOUS
  Administered 2017-11-25: 150 ug via INTRAVENOUS

## 2017-11-25 MED ORDER — SODIUM CHLORIDE 0.9% FLUSH: 3.0000 mL | Freq: Two times a day (BID) | INTRAVENOUS | Status: DC

## 2017-11-25 MED ORDER — ONDANSETRON HCL 4 MG PO TABS: 4.0000 mg | ORAL_TABLET | Freq: Four times a day (QID) | ORAL | Status: DC | PRN

## 2017-11-25 MED ORDER — MIDAZOLAM HCL 2 MG/2ML IJ SOLN
INTRAMUSCULAR | Status: AC
  Filled 2017-11-25: qty 2

## 2017-11-25 MED ORDER — CEFAZOLIN SODIUM-DEXTROSE 2-4 GM/100ML-% IV SOLN
2.0000 g | INTRAVENOUS | Status: AC
  Administered 2017-11-25: 2 g via INTRAVENOUS
  Filled 2017-11-25: qty 100

## 2017-11-25 MED ORDER — PANTOPRAZOLE SODIUM 40 MG PO TBEC
40.0000 mg | DELAYED_RELEASE_TABLET | Freq: Every day | ORAL | Status: DC
  Administered 2017-11-25: 40 mg via ORAL
  Filled 2017-11-25: qty 1

## 2017-11-25 MED ORDER — ACETAMINOPHEN 650 MG RE SUPP: 650.0000 mg | RECTAL | Status: DC | PRN

## 2017-11-25 MED ORDER — HYDROMORPHONE HCL 1 MG/ML IJ SOLN: 0.5000 mg | INTRAMUSCULAR | Status: DC | PRN

## 2017-11-25 MED ORDER — BUPIVACAINE HCL (PF) 0.25 % IJ SOLN
INTRAMUSCULAR | Status: DC | PRN
  Administered 2017-11-25: 4 mL

## 2017-11-25 MED ORDER — HYDROMORPHONE HCL 1 MG/ML IJ SOLN
INTRAMUSCULAR | Status: AC
  Filled 2017-11-25: qty 1

## 2017-11-25 MED ORDER — SODIUM CHLORIDE 0.9 % IV SOLN
INTRAVENOUS | Status: DC | PRN
  Administered 2017-11-25: 12:00:00

## 2017-11-25 MED ORDER — ONDANSETRON HCL 4 MG/2ML IJ SOLN: 4.0000 mg | Freq: Four times a day (QID) | INTRAMUSCULAR | Status: DC | PRN

## 2017-11-25 MED ORDER — CEFAZOLIN SODIUM-DEXTROSE 2-4 GM/100ML-% IV SOLN
2.0000 g | Freq: Three times a day (TID) | INTRAVENOUS | Status: AC
  Administered 2017-11-25: 2 g via INTRAVENOUS
  Filled 2017-11-25: qty 100

## 2017-11-25 MED ORDER — ROCURONIUM BROMIDE 50 MG/5ML IV SOSY
PREFILLED_SYRINGE | INTRAVENOUS | Status: DC | PRN
  Administered 2017-11-25: 10 mg via INTRAVENOUS
  Administered 2017-11-25: 50 mg via INTRAVENOUS

## 2017-11-25 MED ORDER — PROPOFOL 10 MG/ML IV BOLUS
INTRAVENOUS | Status: DC | PRN
  Administered 2017-11-25: 30 mg via INTRAVENOUS
  Administered 2017-11-25: 130 mg via INTRAVENOUS
  Administered 2017-11-25: 40 mg via INTRAVENOUS

## 2017-11-25 MED ORDER — THROMBIN 5000 UNITS EX SOLR
CUTANEOUS | Status: AC
  Filled 2017-11-25: qty 5000

## 2017-11-25 MED ORDER — LACTATED RINGERS IV SOLN
INTRAVENOUS | Status: DC
  Administered 2017-11-25: 09:00:00 via INTRAVENOUS

## 2017-11-25 MED ORDER — SODIUM CHLORIDE 0.9 % IV SOLN: 250.0000 mL | INTRAVENOUS | Status: DC

## 2017-11-25 MED ORDER — PROMETHAZINE HCL 25 MG/ML IJ SOLN: 6.2500 mg | INTRAMUSCULAR | Status: DC | PRN

## 2017-11-25 MED ORDER — 0.9 % SODIUM CHLORIDE (POUR BTL) OPTIME
TOPICAL | Status: DC | PRN
  Administered 2017-11-25: 1000 mL

## 2017-11-25 MED ORDER — POTASSIUM CHLORIDE IN NACL 20-0.9 MEQ/L-% IV SOLN: INTRAVENOUS | Status: DC

## 2017-11-25 MED ORDER — SENNA 8.6 MG PO TABS: 1.0000 | ORAL_TABLET | Freq: Two times a day (BID) | ORAL | Status: DC

## 2017-11-25 MED ORDER — THROMBIN 5000 UNITS EX SOLR
OROMUCOSAL | Status: DC | PRN
  Administered 2017-11-25: 12:00:00 via TOPICAL

## 2017-11-25 MED ORDER — MENTHOL 3 MG MT LOZG: 1.0000 | LOZENGE | OROMUCOSAL | Status: DC | PRN

## 2017-11-25 MED ORDER — PROPOFOL 10 MG/ML IV BOLUS
INTRAVENOUS | Status: AC
  Filled 2017-11-25: qty 20

## 2017-11-25 MED ORDER — LACTATED RINGERS IV SOLN
INTRAVENOUS | Status: DC | PRN
  Administered 2017-11-25 (×2): via INTRAVENOUS

## 2017-11-25 MED ORDER — DEXAMETHASONE SODIUM PHOSPHATE 10 MG/ML IJ SOLN
10.0000 mg | INTRAMUSCULAR | Status: DC
  Filled 2017-11-25: qty 1

## 2017-11-25 MED ORDER — FENTANYL CITRATE (PF) 250 MCG/5ML IJ SOLN
INTRAMUSCULAR | Status: AC
  Filled 2017-11-25: qty 5

## 2017-11-25 MED ORDER — ACETAMINOPHEN 325 MG PO TABS: 650.0000 mg | ORAL_TABLET | ORAL | Status: DC | PRN

## 2017-11-25 MED ORDER — SODIUM CHLORIDE 0.9% FLUSH: 3.0000 mL | INTRAVENOUS | Status: DC | PRN

## 2017-11-25 MED ORDER — HYDROMORPHONE HCL 1 MG/ML IJ SOLN
0.2500 mg | INTRAMUSCULAR | Status: DC | PRN
  Administered 2017-11-25 (×4): 0.5 mg via INTRAVENOUS

## 2017-11-25 MED ORDER — SUGAMMADEX SODIUM 200 MG/2ML IV SOLN
INTRAVENOUS | Status: DC | PRN
  Administered 2017-11-25: 206.6 mg via INTRAVENOUS

## 2017-11-25 MED ORDER — PHENOL 1.4 % MT LIQD: 1.0000 | OROMUCOSAL | Status: DC | PRN

## 2017-11-25 SURGICAL SUPPLY — 53 items
APL SKNCLS STERI-STRIP NONHPOA (GAUZE/BANDAGES/DRESSINGS) ×1
BAG DECANTER FOR FLEXI CONT (MISCELLANEOUS) ×2 IMPLANT
BASKET BONE COLLECTION (BASKET) ×1 IMPLANT
BENZOIN TINCTURE PRP APPL 2/3 (GAUZE/BANDAGES/DRESSINGS) ×2 IMPLANT
BUR MATCHSTICK NEURO 3.0 LAGG (BURR) ×2 IMPLANT
CANISTER SUCT 3000ML PPV (MISCELLANEOUS) ×2 IMPLANT
CARTRIDGE OIL MAESTRO DRILL (MISCELLANEOUS) ×1 IMPLANT
COVER WAND RF STERILE (DRAPES) ×2 IMPLANT
DIFFUSER DRILL AIR PNEUMATIC (MISCELLANEOUS) ×2 IMPLANT
DRAPE C-ARM 42X72 X-RAY (DRAPES) ×4 IMPLANT
DRAPE LAPAROTOMY 100X72 PEDS (DRAPES) ×2 IMPLANT
DRAPE MICROSCOPE LEICA (MISCELLANEOUS) ×2 IMPLANT
DRSG OPSITE POSTOP 3X4 (GAUZE/BANDAGES/DRESSINGS) ×1 IMPLANT
DURAPREP 6ML APPLICATOR 50/CS (WOUND CARE) ×2 IMPLANT
ELECT COATED BLADE 2.86 ST (ELECTRODE) ×2 IMPLANT
ELECT REM PT RETURN 9FT ADLT (ELECTROSURGICAL) ×2
ELECTRODE REM PT RTRN 9FT ADLT (ELECTROSURGICAL) ×1 IMPLANT
GAUZE 4X4 16PLY RFD (DISPOSABLE) IMPLANT
GLOVE BIO SURGEON STRL SZ7 (GLOVE) IMPLANT
GLOVE BIO SURGEON STRL SZ8 (GLOVE) ×2 IMPLANT
GLOVE BIOGEL PI IND STRL 7.0 (GLOVE) IMPLANT
GLOVE BIOGEL PI INDICATOR 7.0 (GLOVE)
GOWN STRL REUS W/ TWL LRG LVL3 (GOWN DISPOSABLE) IMPLANT
GOWN STRL REUS W/ TWL XL LVL3 (GOWN DISPOSABLE) IMPLANT
GOWN STRL REUS W/TWL 2XL LVL3 (GOWN DISPOSABLE) ×2 IMPLANT
GOWN STRL REUS W/TWL LRG LVL3 (GOWN DISPOSABLE)
GOWN STRL REUS W/TWL XL LVL3 (GOWN DISPOSABLE)
HALTER HD/CHIN CERV TRACTION D (MISCELLANEOUS) IMPLANT
HEMOSTAT POWDER KIT SURGIFOAM (HEMOSTASIS) ×2 IMPLANT
KIT BASIN OR (CUSTOM PROCEDURE TRAY) ×2 IMPLANT
KIT TURNOVER KIT B (KITS) ×2 IMPLANT
NDL HYPO 25X1 1.5 SAFETY (NEEDLE) ×1 IMPLANT
NDL SPNL 20GX3.5 QUINCKE YW (NEEDLE) ×1 IMPLANT
NEEDLE HYPO 25X1 1.5 SAFETY (NEEDLE) ×2 IMPLANT
NEEDLE SPNL 20GX3.5 QUINCKE YW (NEEDLE) ×2 IMPLANT
NS IRRIG 1000ML POUR BTL (IV SOLUTION) ×2 IMPLANT
OIL CARTRIDGE MAESTRO DRILL (MISCELLANEOUS) ×2
PACK LAMINECTOMY NEURO (CUSTOM PROCEDURE TRAY) ×2 IMPLANT
PAD ARMBOARD 7.5X6 YLW CONV (MISCELLANEOUS) ×6 IMPLANT
PIN DISTRACTION 14MM (PIN) ×4 IMPLANT
PLATE ONE LEVEL SKYLINE 14MM (Plate) ×1 IMPLANT
PUTTY DBX 1CC (Putty) ×2 IMPLANT
PUTTY DBX 1CC DEPUY (Putty) IMPLANT
RUBBERBAND STERILE (MISCELLANEOUS) ×4 IMPLANT
SCREW VAR SELF TAP SKYLINE 14M (Screw) ×4 IMPLANT
SPACER LORD FUSION 8 (Spacer) ×1 IMPLANT
SPONGE INTESTINAL PEANUT (DISPOSABLE) ×2 IMPLANT
STRIP CLOSURE SKIN 1/2X4 (GAUZE/BANDAGES/DRESSINGS) ×2 IMPLANT
SUT VIC AB 3-0 SH 8-18 (SUTURE) ×2 IMPLANT
SUT VICRYL 4-0 PS2 18IN ABS (SUTURE) ×1 IMPLANT
TOWEL GREEN STERILE (TOWEL DISPOSABLE) ×2 IMPLANT
TOWEL GREEN STERILE FF (TOWEL DISPOSABLE) ×2 IMPLANT
WATER STERILE IRR 1000ML POUR (IV SOLUTION) ×2 IMPLANT

## 2017-11-25 NOTE — Progress Notes (Signed)
Discharged instructions/education/Rx given to patient with wife at bedside and they both verbalized understanding. Patient swallowing well with minimal soreness per patient. Pain is minimal to moderate and controlled by PRN pain medication. No swelling, no drainage and no redness noted on incision site, dressing intact. Patient ambulating well with supervision. Patient emptying bladder well. Discharged via wheelchair.

## 2017-11-25 NOTE — H&P (Signed)
Subjective:   Patient is a 47 y.o. male admitted for R arm apin. The patient first presented to me with complaints of neck pain, shooting pains in the arm(s) and numbness of the arm(s). Onset of symptoms was several months ago. The pain is described as sharp and stabbing and occurs all day. The pain is rated severe, and is located in the neck and radiates to the R arm. The symptoms have been progressive. Symptoms are exacerbated by extending head backwards, and are relieved by none.  Previous work up includes MRI of cervical spine, results: disc bulge at C6-C7 right.  Past Medical History:  Diagnosis Date  . Anxiety   . Arthritis   . Asthma    history [id number]  . Depression   . Essential hypertension 12/11/2011  . Gout   . Headache   . History of hiatal hernia   . History of lithotripsy   . Hypertension   . Kidney stones   . Lumbar degenerative disc disease 12/11/2011  . Nephrolithiasis 12/11/2011  . Pericarditis   . Renal tubular acidosis   . Rheumatoid arthritis(714.0)     Past Surgical History:  Procedure Laterality Date  . ANTERIOR CERVICAL DECOMP/DISCECTOMY FUSION    . CARPAL TUNNEL RELEASE Left 11/06/2015   Procedure: LEFT WRIST CARPAL TUNNEL RELEASE;  Surgeon: Bradly Bienenstock, MD;  Location: [facility] OR;  Service: Orthopedics;  Laterality: Left;  . HEMORROIDECTOMY    . KIDNEY STONE SURGERY    . WRIST FUSION Left 11/06/2015   Procedure: ARTHRODESIS LEFT WRIST;  Surgeon: Bradly Bienenstock, MD;  Location: [facility] OR;  Service: Orthopedics;  Laterality: Left;    Allergies  Allergen Reactions  . Lisinopril Cough  . Benadryl [Diphenhydramine Hcl] Anxiety    Social History   Tobacco Use  . Smoking status: Current Every Day Smoker    Packs/day: 0.25    Years: 30.00    Pack years: 7.50    Types: Cigarettes  . Smokeless tobacco: Former Neurosurgeon    Types: Chew    Quit date: 46  . Tobacco comment: Rx written for Chantix from Dr. Lyn Hollingshead.  Substance Use Topics  . Alcohol use: No     Family History  Problem Relation Age of Onset  . Hypertension Father   . Alcohol abuse Father   . Heart attack Father 39  . Diabetes Mellitus II Father   . Coronary artery disease Father   . Diabetes Mellitus II Mother        Micah Flesher into a diabetic coma, per pt.  . Cancer - Colon Mother   . Coronary artery disease Mother   . Heart attack Mother 20  . Ovarian cancer Mother   . Uterine cancer Mother   . Heart attack Brother   . Coronary artery disease Brother   . Heart murmur Sister   . Dementia Neg Hx   . Bipolar disorder Neg Hx   . Depression Neg Hx   . Drug abuse Neg Hx   . OCD Neg Hx    Prior to Admission medications   Medication Sig Start Date End Date Taking? Authorizing Provider  albuterol (PROVENTIL HFA;VENTOLIN HFA) 108 (90 Base) MCG/ACT inhaler Inhale 1-2 puffs into the lungs every 4 (four) hours as needed for wheezing or shortness of breath. 07/28/17 07/28/18 Yes Sunnie Nielsen, DO  cyclobenzaprine (FLEXERIL) 10 MG tablet Take 1 tablet (10 mg total) by mouth 3 (three) times daily as needed for muscle spasms. 07/28/17  Yes Alexander, Dorene Grebe, DO  EMBEDA 30-1.2 MG  CPCR Take 1 capsule by mouth daily. 11/05/17  Yes [provider]  fluticasone furoate-vilanterol (BREO ELLIPTA) 200-25 MCG/INH AEPB Inhale 1 puff into the lungs daily. 07/28/17  Yes Sunnie Nielsen, DO  methotrexate (RHEUMATREX) 2.5 MG tablet Take 20 mg by mouth every Tuesday. Caution:Chemotherapy. Protect from light.    Yes [provider]  pantoprazole (PROTONIX) 40 MG tablet Take 1 tablet (40 mg total) by mouth daily. Patient taking differently: Take 40 mg by mouth 2 (two) times daily as needed (for acid reflux).  03/30/17  Yes Sunnie Nielsen, DO  AMBULATORY NON FORMULARY MEDICATION Medication Name: Gilmer Mor, use as needed.  Dx: Rheumatoid Arthritis Patient not taking: Reported on 11/22/2017 05/11/13   Laren Boom, DO  amLODipine (NORVASC) 10 MG tablet One tablet by mouth every day for blood  pressure control. Patient not taking: Reported on 11/22/2017 12/30/16 01/19/18  Sunnie Nielsen, DO  atorvastatin (LIPITOR) 40 MG tablet Take 1 tablet (40 mg total) by mouth daily. Patient not taking: Reported on 11/22/2017 03/06/16   Sunnie Nielsen, DO  chlorthalidone (HYGROTON) 50 MG tablet Take 1 tablet (50 mg total) by mouth daily. Patient not taking: Reported on 11/22/2017 12/30/16   Sunnie Nielsen, DO  escitalopram (LEXAPRO) 10 MG tablet Take 1 tablet (10 mg total) by mouth daily. Patient not taking: Reported on 11/22/2017 08/30/17   Thresa Ross, MD  pregabalin (LYRICA) 50 MG capsule Take 1 capsule (50 mg total) by mouth daily. Patient not taking: Reported on 11/22/2017 12/30/16   Sunnie Nielsen, DO  promethazine (PHENERGAN) 25 MG tablet Take 1 tablet (25 mg total) by mouth every 6 (six) hours as needed for nausea or vomiting. 07/28/17   Sunnie Nielsen, DO  SUMAtriptan (IMITREX) 100 MG tablet Take 0.5 tablets (50 mg total) by mouth every 2 (two) hours as needed for migraine. May repeat in 2 hours if headache persists or recurs. 07/28/17   Sunnie Nielsen, DO  vitamin C (ASCORBIC ACID) 500 MG tablet Take 1 tablet (500 mg total) by mouth daily. Patient not taking: Reported on 11/22/2017 11/06/15   Bradly Bienenstock, MD  FLUoxetine (PROZAC) 40 MG capsule Take 1 capsule (40 mg total) by mouth 2 (two) times daily. 06/04/15 09/06/15  Thresa Ross, MD  traZODone (DESYREL) 100 MG tablet Take 1 tablet (100 mg total) by mouth at bedtime as needed for sleep. 03/31/16 06/23/16  Thresa Ross, MD     Review of Systems  Positive ROS: neg  All other systems have been reviewed and were otherwise negative with the exception of those mentioned in the HPI and as above.  Objective: Vital signs in last 24 hours: Temp:  [97.7 F (36.5 C)-98.2 F (36.8 C)] 97.7 F (36.5 C) (10/31 0808) Pulse Rate:  [65-69] 65 (10/31 0808) Resp:  [20] 20 (10/31 0808) BP: (152-160)/(95-101) 152/101 (10/31  0808) SpO2:  [98 %-100 %] 98 % (10/31 0808) Weight:  [103.3 kg] 103.3 kg (10/30 1107)  General Appearance: Alert, cooperative, no distress, appears stated age Head: Normocephalic, without obvious abnormality, atraumatic Eyes: PERRL, conjunctiva/corneas clear, EOM's intact      Neck: Supple, symmetrical, trachea midline, Back: Symmetric, no curvature, ROM normal, no CVA tenderness Lungs:  respirations unlabored Heart: Regular rate and rhythm Abdomen: Soft, non-tender Extremities: Extremities normal, atraumatic, no cyanosis or edema Pulses: 2+ and symmetric all extremities Skin: Skin color, texture, turgor normal, no rashes or lesions  NEUROLOGIC:  Mental status: Alert and oriented x4, no aphasia, good attention span, fund of knowledge and memory  Motor Exam -  grossly normal Sensory Exam - grossly normal Reflexes: 1+ Coordination - grossly normal Gait - grossly normal Balance - grossly normal Cranial Nerves: I: smell Not tested  II: visual acuity  OS: nl    OD: nl  II: visual fields Full to confrontation  II: pupils Equal, round, reactive to light  III,VII: ptosis None  III,IV,VI: extraocular muscles  Full ROM  V: mastication Normal  V: facial light touch sensation  Normal  V,VII: corneal reflex  Present  VII: facial muscle function - upper  Normal  VII: facial muscle function - lower Normal  VIII: hearing Not tested  IX: soft palate elevation  Normal  IX,X: gag reflex Present  XI: trapezius strength  5/5  XI: sternocleidomastoid strength 5/5  XI: neck flexion strength  5/5  XII: tongue strength  Normal    Data Review Lab Results  Component Value Date   WBC 8.8 11/24/2017   HGB 15.9 11/24/2017   HCT 49.1 11/24/2017   MCV 88.8 11/24/2017   PLT 338 11/24/2017   Lab Results  Component Value Date   NA 137 11/24/2017   K 4.2 11/24/2017   CL 109 11/24/2017   CO2 21 (L) 11/24/2017   BUN 8 11/24/2017   CREATININE 0.86 11/24/2017   GLUCOSE 106 (H) 11/24/2017    Lab Results  Component Value Date   INR 0.99 11/24/2017    Assessment:   Cervical neck pain with herniated nucleus pulposus/ spondylosis/ stenosis at C6-7. Estimated body mass index is 35.66 kg/m as calculated from the following:   Height as of 11/24/17: 5\' 7"  (1.702 m).   Weight as of 11/24/17: 103.3 kg.  Patient has failed conservative therapy. Planned surgery : ACDF C6-7  Plan:   I explained the condition and procedure to the patient and answered any questions.  Patient wishes to proceed with procedure as planned. Understands risks/ benefits/ and expected or typical outcomes.  [place] Jaquith S 11/25/2017 10:52 AM

## 2017-11-25 NOTE — Anesthesia Procedure Notes (Signed)
Procedure Name: Intubation Date/Time: 11/25/2017 11:24 AM Performed by: Gwenyth Allegra, CRNA Pre-anesthesia Checklist: Emergency Drugs available, Patient identified, Suction available, Patient being monitored and Timeout performed Patient Re-evaluated:Patient Re-evaluated prior to induction Oxygen Delivery Method: Circle system utilized Preoxygenation: Pre-oxygenation with 100% oxygen Induction Type: IV induction Ventilation: Mask ventilation without difficulty and Oral airway inserted - appropriate to patient size Laryngoscope Size: Glidescope Grade View: Grade I Tube type: Oral Tube size: 7.5 mm Number of attempts: 1 Airway Equipment and Method: Stylet and Video-laryngoscopy Placement Confirmation: ETT inserted through vocal cords under direct vision,  breath sounds checked- equal and bilateral and positive ETCO2 Secured at: 21 cm Dental Injury: Teeth and Oropharynx as per pre-operative assessment

## 2017-11-25 NOTE — Op Note (Signed)
11/25/2017  12:42 PM  PATIENT:  [name]  47 y.o. male  PRE-OPERATIVE DIAGNOSIS: Adjacent level spondylosis C6-7 with neuroforaminal stenosis, neck and arm pain  POST-OPERATIVE DIAGNOSIS:  same  PROCEDURE:  1. Decompressive anterior cervical discectomy C6-7, 2. Anterior cervical arthrodesis C6-7 utilizing a peek interbody cage packed with locally harvested morcellized autologous bone graft mixed with DBM, 3. Anterior cervical plating C6-7 utilizing a Depuy plate after removal of C5-6 plate  SURGEON:  Marikay Alar, MD  ASSISTANTS: Verlin Dike FNP  ANESTHESIA:   General  EBL: less than 50 ml  Total I/O In: 1000 [I.V.:1000] Out: -   BLOOD ADMINISTERED: none  DRAINS: none  SPECIMEN:  none  INDICATION FOR PROCEDURE: This patient presented with neck and right arm pain. Imaging showed cervical spondylosis C6-7. The patient tried conservative measures without relief. Pain was debilitating. Recommended ACDF with plating. Patient understood the risks, benefits, and alternatives and potential outcomes and wished to proceed.  PROCEDURE DETAILS: Patient was brought to the operating room placed under general endotracheal anesthesia. Patient was placed in the supine position on the operating room table. The neck was prepped with Duraprep and draped in a sterile fashion.   Three cc of local anesthesia was injected and a transverse incision was made on the right side of the neck.  Dissection was carried down thru the subcutaneous tissue and the platysma was  elevated, opened, and undermined with Metzenbaum scissors.  Dissection was then carried out thru an avascular plane leaving the sternocleidomastoid carotid artery and jugular vein laterally and the trachea and esophagus medially. The ventral aspect of the vertebral column was identified and the old plate was identified.  Soft tissue was bluntly dissected and the locking mechanism was unlocked and the screws were removed and the plate was  removed from C5-6.  And a localizing x-ray was taken. The C6-7 level was identified. The longus colli muscles were then elevated and the retractor was placed. The annulus was incised and the disc space entered. Discectomy was performed with micro-curettes and pituitary rongeurs. I then used the high-speed drill to drill the endplates down to the level of the posterior longitudinal ligament. The drill shavings were saved in a mucous trap for later arthrodesis. The operating microscope was draped and brought into the field provided additional magnification, illumination and visualization. Discectomy was continued posteriorly thru the disc space. Posterior longitudinal ligament was opened with a nerve hook, and then removed along with disc herniation and osteophytes, decompressing the spinal canal and thecal sac. We then continued to remove osteophytic overgrowth and disc material decompressing the neural foramina and exiting nerve roots bilaterally. The scope was angled up and down to help decompress and undercut the vertebral bodies. Once the decompression was completed we could pass a nerve hook circumferentially to assure adequate decompression in the midline and in the neural foramina. So by both visualization and palpation we felt we had an adequate decompression of the neural elements. We then measured the height of the intravertebral disc space and selected a 8 millimeter Peek interbody cage packed with autograft and morselized allograft. It was then gently positioned in the intravertebral disc space(s) and countersunk. I then used a 14 mm Depuy plate and placed variable angle screws into the vertebral bodies of each level and locked them into position. The wound was irrigated with bacitracin solution, checked for hemostasis which was established and confirmed. Once meticulous hemostasis was achieved, we then proceeded with closure. The platysma was closed with interrupted  3-0 undyed Vicryl suture, the  subcuticular layer was closed with interrupted 3-0 undyed Vicryl suture. The skin edges were approximated with steristrips. The drapes were removed. A sterile dressing was applied. The patient was then awakened from general anesthesia and transferred to the recovery room in stable condition. At the end of the procedure all sponge, needle and instrument counts were correct.   PLAN OF CARE: Admit for overnight observation  PATIENT DISPOSITION:  PACU - hemodynamically stable.   Delay start of Pharmacological VTE agent (>24hrs) due to surgical blood loss or risk of bleeding:  yes

## 2017-11-25 NOTE — Discharge Summary (Signed)
Physician Discharge Summary  Patient ID: [name] MRN: [id number] DOB/AGE: [birth year] 47 y.o.  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 The patient tolerated the procedure well and was taken to the recovery room and then to the floor in stable condition. The hospital course was routine. There were no complications. The wound remained clean dry and intact. Pt had appropriate neck soreness. No complaints of arm pain or new N/T/W. The patient remained afebrile with stable vital signs, and tolerated a regular diet. The patient continued to increase activities, and pain was well controlled with oral pain medications.   Consults: None  Significant Diagnostic Studies:  Results for orders placed or performed during the hospital encounter of 11/24/17  Surgical pcr screen  Result Value Ref Range   MRSA, PCR NEGATIVE NEGATIVE   Staphylococcus aureus NEGATIVE NEGATIVE  Basic metabolic panel  Result Value Ref Range   Sodium 137 135 - 145 mmol/L   Potassium 4.2 3.5 - 5.1 mmol/L   Chloride 109 98 - 111 mmol/L   CO2 21 (L) 22 - 32 mmol/L   Glucose, Bld 106 (H) 70 - 99 mg/dL   BUN 8 6 - 20 mg/dL   Creatinine, Ser 1.36 0.61 - 1.24 mg/dL   Calcium 9.6 8.9 - 43.8 mg/dL   GFR calc non Af Amer >60 >60 mL/min   GFR calc Af Amer >60 >60 mL/min   Anion gap 7 5 - 15  CBC WITH DIFFERENTIAL  Result Value Ref Range   WBC 8.8 4.0 - 10.5 K/uL   RBC 5.53 4.22 - 5.81 MIL/uL   Hemoglobin 15.9 13.0 - 17.0 g/dL   HCT 37.7 93.9 - 68.8 %   MCV 88.8 80.0 - 100.0 fL   MCH 28.8 26.0 - 34.0 pg   MCHC 32.4 30.0 - 36.0 g/dL   RDW 64.8 47.2 - 07.2 %   Platelets 338 150 - 400 K/uL   nRBC 0.0 0.0 - 0.2 %   Neutrophils Relative % 57 %   Neutro Abs 5.0 1.7 - 7.7 K/uL    Lymphocytes Relative 31 %   Lymphs Abs 2.8 0.7 - 4.0 K/uL   Monocytes Relative 9 %   Monocytes Absolute 0.8 0.1 - 1.0 K/uL   Eosinophils Relative 2 %   Eosinophils Absolute 0.2 0.0 - 0.5 K/uL   Basophils Relative 1 %   Basophils Absolute 0.0 0.0 - 0.1 K/uL   Immature Granulocytes 0 %   Abs Immature Granulocytes 0.03 0.00 - 0.07 K/uL  Protime-INR  Result Value Ref Range   Prothrombin Time 13.0 11.4 - 15.2 seconds   INR 0.99     Dg Cervical Spine 1 View  Result Date: 11/25/2017 CLINICAL DATA:  Elective surgery. EXAM: DG CERVICAL SPINE - 1 VIEW COMPARISON:  MRI 10/19/2017. FINDINGS: Lower cervical spine fusion. Anatomic alignment were visualized. Hardware intact were visualized. Diffuse degenerative change. No acute abnormality. Patient is intubated. IMPRESSION: Postoperative changes lower cervical spine. Electronically Signed   By: Maisie Fus  Register   On: 11/25/2017 14:00   Dg C-arm 1-60 Min  Result Date: 11/25/2017 CLINICAL DATA:  Cervical spine surgery. EXAM: DG C-ARM 61-120 MIN COMPARISON:  MRI 10/19/2017. FINDINGS: Prior lower cervical spine anterior interbody fusion. Hardware intact and anatomic alignment noted where visualized. Postsurgical changes are  difficult to visualize due to technique. IMPRESSION: Postsurgical changes lower cervical spine. Electronically Signed   By: Maisie Fus  Register   On: 11/25/2017 14:01    Antibiotics:  Anti-infectives (From admission, onward)   Start     Dose/Rate Route Frequency Ordered Stop   11/25/17 1900  ceFAZolin (ANCEF) IVPB 2g/100 mL premix     2 g 200 mL/hr over 30 Minutes Intravenous Every 8 hours 11/25/17 1617 11/26/17 1059   11/25/17 1147  bacitracin 50,000 Units in sodium chloride 0.9 % 500 mL irrigation  Status:  Discontinued       As needed 11/25/17 1147 11/25/17 1245   11/25/17 0800  ceFAZolin (ANCEF) IVPB 2g/100 mL premix     2 g 200 mL/hr over 30 Minutes Intravenous On call to O.R. 11/25/17 0759 11/25/17 1115      Discharge  Exam: Blood pressure (!) 152/101, pulse 65, temperature 97.8 F (36.6 C), resp. rate 20, SpO2 98 %. Neurologic: Grossly normal Dressing dry  Discharge Medications:   Allergies as of 11/25/2017      Reactions   Lisinopril Cough   Benadryl [diphenhydramine Hcl] Anxiety      Medication List    STOP taking these medications   EMBEDA 30-1.2 MG Cpcr Generic drug:  Morphine-Naltrexone     TAKE these medications   albuterol 108 (90 Base) MCG/ACT inhaler Commonly known as:  PROVENTIL HFA;VENTOLIN HFA Inhale 1-2 puffs into the lungs every 4 (four) hours as needed for wheezing or shortness of breath.   AMBULATORY NON FORMULARY MEDICATION Medication Name: Gilmer Mor, use as needed.  Dx: Rheumatoid Arthritis   amLODipine 10 MG tablet Commonly known as:  NORVASC One tablet by mouth every day for blood pressure control.   atorvastatin 40 MG tablet Commonly known as:  LIPITOR Take 1 tablet (40 mg total) by mouth daily.   chlorthalidone 50 MG tablet Commonly known as:  HYGROTON Take 1 tablet (50 mg total) by mouth daily.   cyclobenzaprine 10 MG tablet Commonly known as:  FLEXERIL Take 1 tablet (10 mg total) by mouth 3 (three) times daily as needed for muscle spasms.   escitalopram 10 MG tablet Commonly known as:  LEXAPRO Take 1 tablet (10 mg total) by mouth daily.   fluticasone furoate-vilanterol 200-25 MCG/INH Aepb Commonly known as:  BREO ELLIPTA Inhale 1 puff into the lungs daily.   methotrexate 2.5 MG tablet Commonly known as:  RHEUMATREX Take 20 mg by mouth every Tuesday. Caution:Chemotherapy. Protect from light.   oxyCODONE 5 MG immediate release tablet Commonly known as:  Oxy IR/ROXICODONE Take 1 tablet (5 mg total) by mouth every 6 (six) hours as needed for moderate pain ((score 4 to 6)).   pantoprazole 40 MG tablet Commonly known as:  PROTONIX Take 1 tablet (40 mg total) by mouth daily. What changed:    when to take this  reasons to take this   pregabalin 50 MG  capsule Commonly known as:  LYRICA Take 1 capsule (50 mg total) by mouth daily.   promethazine 25 MG tablet Commonly known as:  PHENERGAN Take 1 tablet (25 mg total) by mouth every 6 (six) hours as needed for nausea or vomiting.   SUMAtriptan 100 MG tablet Commonly known as:  IMITREX Take 0.5 tablets (50 mg total) by mouth every 2 (two) hours as needed for migraine. May repeat in 2 hours if headache persists or recurs.   vitamin C 500 MG tablet Commonly known as:  ASCORBIC ACID Take 1 tablet (500 mg total)  by mouth daily.       Disposition: home   Final Dx: ACDF C6-7  Discharge Instructions     Remove dressing in 72 hours   Complete by:  As directed    Call MD for:  difficulty breathing, headache or visual disturbances   Complete by:  As directed    Call MD for:  persistant nausea and vomiting   Complete by:  As directed    Call MD for:  redness, tenderness, or signs of infection (pain, swelling, redness, odor or green/yellow discharge around incision site)   Complete by:  As directed    Call MD for:  severe uncontrolled pain   Complete by:  As directed    Call MD for:  temperature >100.4   Complete by:  As directed    Diet - low sodium heart healthy   Complete by:  As directed    Increase activity slowly   Complete by:  As directed          Signed: Charyl Minervini S 11/25/2017, 4:28 PM

## 2017-11-25 NOTE — Anesthesia Postprocedure Evaluation (Signed)
Anesthesia Post Note  Patient: LIBERTY STEAD  Procedure(s) Performed: Anterior Cervical Discectomy Fusion - Cervical Six-Cervical Seven, removal Cervical Five-Six plate (N/A )     Patient location during evaluation: PACU Anesthesia Type: General Level of consciousness: sedated Pain management: pain level controlled Vital Signs Assessment: post-procedure vital signs reviewed and stable Respiratory status: spontaneous breathing and respiratory function stable Cardiovascular status: stable Postop Assessment: no apparent nausea or vomiting Anesthetic complications: no    Last Vitals:  Vitals:   11/25/17 0808 11/25/17 1257  BP: (!) 152/101   Pulse: 65   Resp: 20   Temp: 36.5 C (!) 36.1 C  SpO2: 98%                   Rontrell Moquin DANIEL

## 2017-11-25 NOTE — Anesthesia Preprocedure Evaluation (Addendum)
Anesthesia Evaluation  Patient identified by MRN, date of birth, ID band Patient awake    Reviewed: Allergy & Precautions, NPO status , Patient's Chart, lab work & pertinent test results  History of Anesthesia Complications Negative for: history of anesthetic complications  Airway Mallampati: II  TM Distance: >3 FB Neck ROM: Full    Dental  (+) Dental Advisory Given   Pulmonary neg pulmonary ROS, asthma , Current Smoker,    Pulmonary exam normal        Cardiovascular hypertension, Pt. on medications Normal cardiovascular exam     Neuro/Psych PSYCHIATRIC DISORDERS Anxiety Depression    GI/Hepatic Neg liver ROS, hiatal hernia,   Endo/Other  negative endocrine ROS  Renal/GU negative Renal ROS  negative genitourinary   Musculoskeletal negative musculoskeletal ROS (+)   Abdominal   Peds negative pediatric ROS (+)  Hematology negative hematology ROS (+)   Anesthesia Other Findings   Reproductive/Obstetrics negative OB ROS                            Anesthesia Physical Anesthesia Plan  ASA: II  Anesthesia Plan: General   Post-op Pain Management:    Induction: Intravenous  PONV Risk Score and Plan: 2 and Ondansetron and Dexamethasone  Airway Management Planned: Oral ETT  Additional Equipment:   Intra-op Plan:   Post-operative Plan: Extubation in OR  Informed Consent: I have reviewed the patients History and Physical, chart, labs and discussed the procedure including the risks, benefits and alternatives for the proposed anesthesia with the patient or authorized representative who has indicated his/her understanding and acceptance.   Dental advisory given  Plan Discussed with: CRNA and Anesthesiologist  Anesthesia Plan Comments:         Anesthesia Quick Evaluation

## 2017-11-25 NOTE — Progress Notes (Signed)
Orthopedic Tech Progress Note Patient Details:  [name] 03-25-1970 [id number]  Ortho Devices Type of Ortho Device: Soft collar Ortho Device/Splint Location: neck Ortho Device/Splint Interventions: Application   Post Interventions Patient Tolerated: Well Instructions Provided: Care of device   Nikki Dom 11/25/2017, 5:03 PM

## 2017-11-25 NOTE — Transfer of Care (Signed)
Immediate Anesthesia Transfer of Care Note  Patient: [name]  Procedure(s) Performed: Anterior Cervical Discectomy Fusion - Cervical Six-Cervical Seven, removal Cervical Five-Six plate (N/A )  Patient Location: PACU  Anesthesia Type:General  Level of Consciousness: awake and alert   Airway & Oxygen Therapy: Patient Spontanous Breathing and Patient connected to nasal cannula oxygen  Post-op Assessment: Report given to RN and Post -op Vital signs reviewed and stable  Post vital signs: Reviewed and stable  Last Vitals:  Vitals Value Taken Time  BP 148/96 11/25/2017 12:56 PM  Temp    Pulse 75 11/25/2017 12:59 PM  Resp 15 11/25/2017 12:59 PM  SpO2 96 % 11/25/2017 12:59 PM  Vitals shown include unvalidated device data.  Last Pain:  Vitals:   11/25/17 0851  TempSrc:   PainSc: 0-No pain         Complications: No apparent anesthesia complications

## 2017-11-26 ENCOUNTER — Encounter (HOSPITAL_COMMUNITY): Payer: Self-pay | Admitting: Neurological Surgery

## 2017-12-09 DIAGNOSIS — M069 Rheumatoid arthritis, unspecified: Secondary | ICD-10-CM | POA: Diagnosis not present

## 2017-12-09 DIAGNOSIS — M47817 Spondylosis without myelopathy or radiculopathy, lumbosacral region: Secondary | ICD-10-CM | POA: Diagnosis not present

## 2017-12-09 DIAGNOSIS — G894 Chronic pain syndrome: Secondary | ICD-10-CM | POA: Diagnosis not present

## 2017-12-09 DIAGNOSIS — M47812 Spondylosis without myelopathy or radiculopathy, cervical region: Secondary | ICD-10-CM | POA: Diagnosis not present

## 2018-01-03 DIAGNOSIS — M5412 Radiculopathy, cervical region: Secondary | ICD-10-CM | POA: Diagnosis not present

## 2018-01-06 DIAGNOSIS — M47817 Spondylosis without myelopathy or radiculopathy, lumbosacral region: Secondary | ICD-10-CM | POA: Diagnosis not present

## 2018-01-06 DIAGNOSIS — M47812 Spondylosis without myelopathy or radiculopathy, cervical region: Secondary | ICD-10-CM | POA: Diagnosis not present

## 2018-01-06 DIAGNOSIS — M069 Rheumatoid arthritis, unspecified: Secondary | ICD-10-CM | POA: Diagnosis not present

## 2018-01-06 DIAGNOSIS — G894 Chronic pain syndrome: Secondary | ICD-10-CM | POA: Diagnosis not present

## 2018-01-13 ENCOUNTER — Ambulatory Visit (HOSPITAL_COMMUNITY): Payer: Medicare Other | Admitting: Psychiatry

## 2018-01-27 ENCOUNTER — Ambulatory Visit (INDEPENDENT_AMBULATORY_CARE_PROVIDER_SITE_OTHER): Payer: Medicare Other | Admitting: Osteopathic Medicine

## 2018-01-27 DIAGNOSIS — Z23 Encounter for immunization: Secondary | ICD-10-CM | POA: Diagnosis not present

## 2018-02-03 ENCOUNTER — Encounter: Payer: Self-pay | Admitting: Osteopathic Medicine

## 2018-02-03 ENCOUNTER — Ambulatory Visit (INDEPENDENT_AMBULATORY_CARE_PROVIDER_SITE_OTHER): Payer: Medicare Other | Admitting: Osteopathic Medicine

## 2018-02-03 VITALS — BP 134/93 | HR 75 | Temp 98.1°F | Wt 232.2 lb

## 2018-02-03 DIAGNOSIS — Z800 Family history of malignant neoplasm of digestive organs: Secondary | ICD-10-CM

## 2018-02-03 DIAGNOSIS — I10 Essential (primary) hypertension: Secondary | ICD-10-CM | POA: Diagnosis not present

## 2018-02-03 DIAGNOSIS — Z1211 Encounter for screening for malignant neoplasm of colon: Secondary | ICD-10-CM

## 2018-02-03 DIAGNOSIS — F331 Major depressive disorder, recurrent, moderate: Secondary | ICD-10-CM

## 2018-02-03 DIAGNOSIS — E785 Hyperlipidemia, unspecified: Secondary | ICD-10-CM | POA: Diagnosis not present

## 2018-02-03 DIAGNOSIS — Z0000 Encounter for general adult medical examination without abnormal findings: Secondary | ICD-10-CM

## 2018-02-03 DIAGNOSIS — M069 Rheumatoid arthritis, unspecified: Secondary | ICD-10-CM

## 2018-02-03 MED ORDER — ESCITALOPRAM OXALATE 10 MG PO TABS: 10.0000 mg | ORAL_TABLET | Freq: Every day | ORAL | 3 refills | Status: DC

## 2018-02-03 MED ORDER — VARENICLINE TARTRATE 1 MG PO TABS: 1.0000 mg | ORAL_TABLET | Freq: Two times a day (BID) | ORAL | 1 refills | Status: DC

## 2018-02-03 MED ORDER — VARENICLINE TARTRATE 0.5 MG X 11 & 1 MG X 42 PO MISC: ORAL | 0 refills | Status: DC

## 2018-02-03 MED ORDER — OMEPRAZOLE 40 MG PO CPDR: 40.0000 mg | DELAYED_RELEASE_CAPSULE | Freq: Every day | ORAL | 3 refills | Status: DC

## 2018-02-03 MED ORDER — AMLODIPINE BESYLATE 10 MG PO TABS: ORAL_TABLET | ORAL | 3 refills | Status: DC

## 2018-02-03 MED ORDER — AMOXICILLIN-POT CLAVULANATE 875-125 MG PO TABS: 1.0000 | ORAL_TABLET | Freq: Two times a day (BID) | ORAL | 0 refills | Status: AC

## 2018-02-03 MED ORDER — ATORVASTATIN CALCIUM 40 MG PO TABS: 40.0000 mg | ORAL_TABLET | Freq: Every day | ORAL | 3 refills | Status: DC

## 2018-02-03 NOTE — Patient Instructions (Addendum)
General Preventive Care  Most recent routine screening lipids/other labs: ordered today, please return to get blood work drawn fasting.   Blood pressure control is important to prevent heart attack and stroke!   Tobacco: Let us try the Chantix again to see if that helps with quitting.  Please see attached booklet for more information.  Alcohol: responsible moderation is ok for most adults - if you have concerns about your alcohol intake, please talk to me!   Exercise: as tolerated to reduce risk of cardiovascular disease and diabetes. Strength training will also prevent osteoporosis.   Mental health: if need for mental health care (medicines, counseling, other), or concerns about moods, please let me know!   Sexual health: if need for STD testing, or if concerns with libido/pain problems, please let me know!   Advanced Directive: Living Will and/or Healthcare Power of Attorney recommended for all adults, regardless of age or health.  Vaccines  Flu vaccine: recommended for almost everyone, every fall.   Shingles vaccine: Shingrix recommended after age 73.  Pneumonia vaccines: Would strongly consider getting this if continue smoking  Tetanus booster: Tdap done 10/2014, recommended every 10 years.  Cancer screenings   Colon cancer screening: Given your family history, will send referral to gastroenterologist.  Prostate cancer screening: PSA blood test around age 52  Lung cancer screening: CT chest every year for those sge 104 to 49 years old with ?30 pack year smoking history, who either currently smoke or have quit within the past 15 years.  Infection screenings . HIV, Gonorrhea/Chlamydia: screening as needed. . Hepatitis C: recommended for anyone born 1946-1965, not needed for you. . TB: certain at-risk populations, or depending on work requirements and/or travel history Other . Bone Density Test: recommended for men at age 64, sooner depending on risk factors . Abdominal Aortic  Aneurysm: screening with ultrasound recommended once for men age 11-75 who have ever smoked - you will eventually need this test.

## 2018-02-03 NOTE — Progress Notes (Signed)
HPI: [name] is a 48 y.o. male who  has a past medical history of Anxiety, Arthritis, Asthma, Depression, Essential hypertension (12/11/2011), Gout, Headache, History of hiatal hernia, History of lithotripsy, Hypertension, Kidney stones, Lumbar degenerative disc disease (12/11/2011), Nephrolithiasis (12/11/2011), Pericarditis, Renal tubular acidosis, and Rheumatoid arthritis(714.0).  he presents to [facility] today, 02/03/18,  for chief complaint of: Annual check-up    Patient here for annual physical / wellness exam.  See preventive care reviewed as below.     Additional concerns today include:  None      Past medical, surgical, social and family history reviewed:  Patient Active Problem List   Diagnosis Date Noted  . Cervical vertebral fusion 11/25/2017  . Migraine 03/08/2016  . History of obstructive sleep apnea 01/17/2016  . Arthritis of left wrist 11/06/2015  . Left hand pain 08/27/2015  . Rectal bleeding 05/11/2013  . Hyperglycemia 11/02/2012  . Tinnitus 11/02/2012  . Major depressive disorder, single episode, severe, without mention of psychotic behavior 08/31/2012  . Infected sebaceous cyst of skin 04/19/2012  . Hypercalcemia 03/16/2012  . Tobacco use 03/04/2012  . Restless leg 02/12/2012  . Degenerative disc disease, cervical 12/28/2011  . Hyperlipidemia LDL goal <130 12/21/2011  . Rheumatoid arthritis (HCC) 12/17/2011  . Essential hypertension 12/11/2011  . Lumbar degenerative disc disease 12/11/2011  . Nephrolithiasis 12/11/2011  . Chronic pain of multiple joints 12/11/2011  . Right shoulder pain 12/11/2011  . Left shoulder pain 12/11/2011    Past Surgical History:  Procedure Laterality Date  . ANTERIOR CERVICAL DECOMP/DISCECTOMY FUSION    . ANTERIOR CERVICAL DECOMP/DISCECTOMY FUSION N/A 11/25/2017   Procedure: Anterior Cervical Discectomy Fusion - Cervical Six-Cervical Seven, removal Cervical Five-Six plate;   Surgeon: Tia Alert, MD;  Location: [facility] OR;  Service: Neurosurgery;  Laterality: N/A;  Anterior Cervical Discectomy Fusion - Cervical Six-Cervical Seven, removal Cervical Five-Six plate  . CARPAL TUNNEL RELEASE Left 11/06/2015   Procedure: LEFT WRIST CARPAL TUNNEL RELEASE;  Surgeon: Bradly Bienenstock, MD;  Location: [facility] OR;  Service: Orthopedics;  Laterality: Left;  . HEMORROIDECTOMY    . KIDNEY STONE SURGERY    . WRIST FUSION Left 11/06/2015   Procedure: ARTHRODESIS LEFT WRIST;  Surgeon: Bradly Bienenstock, MD;  Location: [facility] OR;  Service: Orthopedics;  Laterality: Left;    Social History   Tobacco Use  . Smoking status: Current Every Day Smoker    Packs/day: 0.25    Years: 30.00    Pack years: 7.50    Types: Cigarettes  . Smokeless tobacco: Former Neurosurgeon    Types: Chew    Quit date: 5  . Tobacco comment: Rx written for Chantix from Dr. Lyn Hollingshead.  Substance Use Topics  . Alcohol use: No    Family History  Problem Relation Age of Onset  . Hypertension Father   . Alcohol abuse Father   . Heart attack Father 55  . Diabetes Mellitus II Father   . Coronary artery disease Father   . Diabetes Mellitus II Mother        Micah Flesher into a diabetic coma, per pt.  . Cancer - Colon Mother   . Coronary artery disease Mother   . Heart attack Mother 58  . Ovarian cancer Mother   . Uterine cancer Mother   . Heart attack Brother   . Coronary artery disease Brother   . Heart murmur Sister   . Dementia Neg Hx   . Bipolar disorder Neg Hx   . Depression  Neg Hx   . Drug abuse Neg Hx   . OCD Neg Hx      Current medication list and allergy/intolerance information reviewed:    Current Outpatient Medications  Medication Sig Dispense Refill  . albuterol (PROVENTIL HFA;VENTOLIN HFA) 108 (90 Base) MCG/ACT inhaler Inhale 1-2 puffs into the lungs every 4 (four) hours as needed for wheezing or shortness of breath. 1 Inhaler 3  . AMBULATORY NON FORMULARY MEDICATION Medication Name: Gilmer Mor, use as needed.  Dx:  Rheumatoid Arthritis 1 Units 0  . atorvastatin (LIPITOR) 40 MG tablet Take 1 tablet (40 mg total) by mouth daily. 90 tablet 3  . chlorthalidone (HYGROTON) 50 MG tablet Take 1 tablet (50 mg total) by mouth daily. 90 tablet 3  . cyclobenzaprine (FLEXERIL) 10 MG tablet Take 1 tablet (10 mg total) by mouth 3 (three) times daily as needed for muscle spasms. 90 tablet 5  . escitalopram (LEXAPRO) 10 MG tablet Take 1 tablet (10 mg total) by mouth daily. 30 tablet 3  . fluticasone furoate-vilanterol (BREO ELLIPTA) 200-25 MCG/INH AEPB Inhale 1 puff into the lungs daily. 60 each 11  . methotrexate (RHEUMATREX) 2.5 MG tablet Take 20 mg by mouth every Tuesday. Caution:Chemotherapy. Protect from light.     . pantoprazole (PROTONIX) 40 MG tablet Take 1 tablet (40 mg total) by mouth daily. (Patient taking differently: Take 40 mg by mouth 2 (two) times daily as needed (for acid reflux). ) 90 tablet 3  . pregabalin (LYRICA) 50 MG capsule Take 1 capsule (50 mg total) by mouth daily. 90 capsule 3  . promethazine (PHENERGAN) 25 MG tablet Take 1 tablet (25 mg total) by mouth every 6 (six) hours as needed for nausea or vomiting. 60 tablet 3  . SUMAtriptan (IMITREX) 100 MG tablet Take 0.5 tablets (50 mg total) by mouth every 2 (two) hours as needed for migraine. May repeat in 2 hours if headache persists or recurs. 10 tablet 3  . amLODipine (NORVASC) 10 MG tablet One tablet by mouth every day for blood pressure control. (Patient not taking: Reported on 11/22/2017) 90 tablet 3   No current facility-administered medications for this visit.     Allergies  Allergen Reactions  . Lisinopril Cough  . Benadryl [Diphenhydramine Hcl] Anxiety      Review of Systems:  Constitutional:  No  fever, no chills, No recent illness, No unintentional weight changes. No significant fatigue.   HEENT: No  headache, no vision change, no hearing change, No sore throat, No  sinus pressure  Cardiac: No  chest pain, No  pressure, No  palpitations, No  Orthopnea  Respiratory:  No  shortness of breath. No  Cough  Gastrointestinal: No  abdominal pain, No  nausea, No  vomiting,  No  blood in stool, No  diarrhea, No  constipation   Musculoskeletal: No new myalgia/arthralgia  Skin: No  Rash, No other wounds/concerning lesions  Genitourinary: No  incontinence, No  abnormal genital bleeding, No abnormal genital discharge  Hem/Onc: No  easy bruising/bleeding, No  abnormal lymph node  Endocrine: No cold intolerance,  No heat intolerance. No polyuria/polydipsia/polyphagia   Neurologic: No  weakness, No  dizziness, No  slurred speech/focal weakness/facial droop  Psychiatric: No  concerns with depression, No  concerns with anxiety, No sleep problems, No mood problems  Exam:  BP (!) 134/93 (BP Location: Left Arm, Patient Position: Sitting, Cuff Size: Large)   Pulse 75   Temp 98.1 F (36.7 C) (Oral)   Wt 232 lb 3.2  oz (105.3 kg)   BMI 36.37 kg/m   Constitutional: VS see above. General Appearance: alert, well-developed, well-nourished, NAD  Eyes: Normal lids and conjunctive, non-icteric sclera  Ears, Nose, Mouth, Throat: MMM, Normal external inspection ears/nares/mouth/lips/gums. TM normal bilaterally. Pharynx/tonsils no erythema, no exudate. Nasal mucosa normal.   Neck: No masses, trachea midline. No thyroid enlargement. No tenderness/mass appreciated. No lymphadenopathy  Respiratory: Normal respiratory effort. no wheeze, no rhonchi, no rales  Cardiovascular: S1/S2 normal, no murmur, no rub/gallop auscultated. RRR. No lower extremity edema.   Gastrointestinal: Nontender, no masses. No hepatomegaly, no splenomegaly. No hernia appreciated. Bowel sounds normal. Rectal exam deferred.   Musculoskeletal: Gait normal. No clubbing/cyanosis of digits.   Neurological: Normal balance/coordination. No tremor. No cranial nerve deficit on limited exam. Motor and sensation intact and symmetric. Cerebellar reflexes intact.    Skin: warm, dry, intact. No rash/ulcer. No concerning nevi or subq nodules on limited exam.    Psychiatric: Normal judgment/insight. Normal mood and affect. Oriented x3.       ASSESSMENT/PLAN: The primary encounter diagnosis was Annual physical exam. Diagnoses of Essential hypertension, Rheumatoid arthritis of hand, unspecified laterality, unspecified rheumatoid factor presence (HCC), and Depression, major, recurrent, moderate (HCC) were also pertinent to this visit.   Orders Placed This Encounter  Procedures  . CBC  . COMPLETE METABOLIC PANEL WITH GFR  . Lipid panel  . Ambulatory referral to Gastroenterology   Immunization History  Administered Date(s) Administered  . Influenza, Seasonal, Injecte, Preservative Fre 02/12/2012  . Influenza,inj,Quad PF,6+ Mos 10/02/2014, 09/25/2015, 12/30/2016, 01/27/2018  . PPD Test 05/14/2014  . Tdap 11/12/2014     Meds ordered this encounter  Medications  . varenicline (CHANTIX STARTING MONTH PAK) 0.5 MG X 11 & 1 MG X 42 tablet    Sig: Take one 0.5 mg tablet by mouth once daily for 3 days, then increase to one 0.5 mg tablet twice daily for 4 days, then increase to one 1 mg tablet twice daily.    Dispense:  53 tablet    Refill:  0  . varenicline (CHANTIX CONTINUING MONTH PAK) 1 MG tablet    Sig: Take 1 tablet (1 mg total) by mouth 2 (two) times daily.    Dispense:  60 tablet    Refill:  1  . amoxicillin-clavulanate (AUGMENTIN) 875-125 MG tablet    Sig: Take 1 tablet by mouth 2 (two) times daily for 7 days.    Dispense:  14 tablet    Refill:  0  . amLODipine (NORVASC) 10 MG tablet    Sig: One tablet by mouth every day for blood pressure control.    Dispense:  90 tablet    Refill:  3  . atorvastatin (LIPITOR) 40 MG tablet    Sig: Take 1 tablet (40 mg total) by mouth daily.    Dispense:  90 tablet    Refill:  3  . escitalopram (LEXAPRO) 10 MG tablet    Sig: Take 1 tablet (10 mg total) by mouth daily.    Dispense:  30 tablet     Refill:  3  . omeprazole (PRILOSEC) 40 MG capsule    Sig: Take 1 capsule (40 mg total) by mouth daily.    Dispense:  90 capsule    Refill:  3    Patient Instructions  General Preventive Care  Most recent routine screening lipids/other labs: ordered today, please return to get blood work drawn fasting.   Blood pressure control is important to prevent heart attack and stroke!  Tobacco: Let us try the Chantix again to see if that helps with quitting.  Please see attached booklet for more information.  Alcohol: responsible moderation is ok for most adults - if you have concerns about your alcohol intake, please talk to me!   Exercise: as tolerated to reduce risk of cardiovascular disease and diabetes. Strength training will also prevent osteoporosis.   Mental health: if need for mental health care (medicines, counseling, other), or concerns about moods, please let me know!   Sexual health: if need for STD testing, or if concerns with libido/pain problems, please let me know!   Advanced Directive: Living Will and/or Healthcare Power of Attorney recommended for all adults, regardless of age or health.  Vaccines  Flu vaccine: recommended for almost everyone, every fall.   Shingles vaccine: Shingrix recommended after age 49.  Pneumonia vaccines: Would strongly consider getting this if continue smoking  Tetanus booster: Tdap done 10/2014, recommended every 10 years.  Cancer screenings   Colon cancer screening: Given your family history, will send referral to gastroenterologist.  Prostate cancer screening: PSA blood test around age 49  Lung cancer screening: CT chest every year for those sge 7755 to 49 years old with ?30 pack year smoking history, who either currently smoke or have quit within the past 15 years.  Infection screenings . HIV, Gonorrhea/Chlamydia: screening as needed. . Hepatitis C: recommended for anyone born 1945-1965, not needed for you. . TB: certain at-risk  populations, or depending on work requirements and/or travel history Other . Bone Density Test: recommended for men at age 49, sooner depending on risk factors . Abdominal Aortic Aneurysm: screening with ultrasound recommended once for men age 49-75 who have ever smoked - you will eventually need this test.        Visit summary with medication list and pertinent instructions was printed for patient to review. All questions at time of visit were answered - patient instructed to contact office with any additional concerns or updates. ER/RTC precautions were reviewed with the patient.    Please note: voice recognition software was used to produce this document, and typos may escape review. Please contact Dr. Lyn HollingsheadAlexander for any needed clarifications.     Follow-up plan: Return in about 2 weeks (around 02/17/2018) for Nurse visit recheck blood pressure.  6 months follow-up with Dr. Lyn HollingsheadAlexander. .Marland Kitchen

## 2018-02-05 ENCOUNTER — Encounter: Payer: Self-pay | Admitting: Osteopathic Medicine

## 2018-02-07 ENCOUNTER — Encounter: Payer: Self-pay | Admitting: Gastroenterology

## 2018-02-07 DIAGNOSIS — M47817 Spondylosis without myelopathy or radiculopathy, lumbosacral region: Secondary | ICD-10-CM | POA: Diagnosis not present

## 2018-02-07 DIAGNOSIS — G894 Chronic pain syndrome: Secondary | ICD-10-CM | POA: Diagnosis not present

## 2018-02-07 DIAGNOSIS — M069 Rheumatoid arthritis, unspecified: Secondary | ICD-10-CM | POA: Diagnosis not present

## 2018-02-07 DIAGNOSIS — M47812 Spondylosis without myelopathy or radiculopathy, cervical region: Secondary | ICD-10-CM | POA: Diagnosis not present

## 2018-02-10 ENCOUNTER — Ambulatory Visit (INDEPENDENT_AMBULATORY_CARE_PROVIDER_SITE_OTHER): Payer: Medicare Other | Admitting: Psychiatry

## 2018-02-10 ENCOUNTER — Encounter (HOSPITAL_COMMUNITY): Payer: Self-pay | Admitting: Psychiatry

## 2018-02-10 DIAGNOSIS — F331 Major depressive disorder, recurrent, moderate: Secondary | ICD-10-CM | POA: Diagnosis not present

## 2018-02-10 DIAGNOSIS — F411 Generalized anxiety disorder: Secondary | ICD-10-CM

## 2018-02-10 DIAGNOSIS — F5102 Adjustment insomnia: Secondary | ICD-10-CM

## 2018-02-10 NOTE — Progress Notes (Signed)
Patient ID: [name], male   DOB: January 26, 1971, 48 y.o.   MRN: [id number] [place] Follow-up Outpatient Visit  ISHAN SANROMAN [id number] 48 y.o.  02/10/2018  Chief Complaint: follow up for depression and anxiety.     History of Present Illness:     HPI Comments: Mr. Hamm is a 48 y/o male with a past psychiatric history significant for symptoms of anxiety and depression. The patient was initially referred for psychiatric services for medication management.   Doing fair with meds. Relationship is fair Had cervical surgery October 2019, pain and tingling is better, much relieved, still on pain meds  Modifying factors: relationship and on disability  . Severity: Depression; fair No psychosis      Past Medical History:  Diagnosis Date  . Anxiety   . Arthritis   . Asthma    history [id number]  . Depression   . Essential hypertension 12/11/2011  . Gout   . Headache   . History of hiatal hernia   . History of lithotripsy   . Hypertension   . Kidney stones   . Lumbar degenerative disc disease 12/11/2011  . Nephrolithiasis 12/11/2011  . Pericarditis   . Renal tubular acidosis   . Rheumatoid arthritis(714.0)    Family History  Problem Relation Age of Onset  . Hypertension Father   . Alcohol abuse Father   . Heart attack Father 42  . Diabetes Mellitus II Father   . Coronary artery disease Father   . Diabetes Mellitus II Mother        Martin Majestic into a diabetic coma, per pt.  . Cancer - Colon Mother   . Coronary artery disease Mother   . Heart attack Mother 14  . Ovarian cancer Mother   . Uterine cancer Mother   . Heart attack Brother   . Coronary artery disease Brother   . Heart murmur Sister   . Dementia Neg Hx   . Bipolar disorder Neg Hx   . Depression Neg Hx   . Drug abuse Neg Hx   . OCD Neg Hx     Outpatient Encounter Medications as of 02/10/2018  Medication Sig  . albuterol (PROVENTIL HFA;VENTOLIN HFA) 108 (90 Base) MCG/ACT inhaler  Inhale 1-2 puffs into the lungs every 4 (four) hours as needed for wheezing or shortness of breath.  . AMBULATORY NON FORMULARY MEDICATION Medication Name: Kasandra Knudsen, use as needed.  Dx: Rheumatoid Arthritis  . amLODipine (NORVASC) 10 MG tablet One tablet by mouth every day for blood pressure control.  Marland Kitchen amoxicillin-clavulanate (AUGMENTIN) 875-125 MG tablet Take 1 tablet by mouth 2 (two) times daily for 7 days.  Marland Kitchen atorvastatin (LIPITOR) 40 MG tablet Take 1 tablet (40 mg total) by mouth daily.  . cyclobenzaprine (FLEXERIL) 10 MG tablet Take 1 tablet (10 mg total) by mouth 3 (three) times daily as needed for muscle spasms.  Marland Kitchen escitalopram (LEXAPRO) 10 MG tablet Take 1 tablet (10 mg total) by mouth daily.  . fluticasone furoate-vilanterol (BREO ELLIPTA) 200-25 MCG/INH AEPB Inhale 1 puff into the lungs daily.  . methotrexate (RHEUMATREX) 2.5 MG tablet Take 20 mg by mouth every Tuesday. Caution:Chemotherapy. Protect from light.   Marland Kitchen omeprazole (PRILOSEC) 40 MG capsule Take 1 capsule (40 mg total) by mouth daily.  . pregabalin (LYRICA) 50 MG capsule Take 1 capsule (50 mg total) by mouth daily.  . promethazine (PHENERGAN) 25 MG tablet Take 1 tablet (25 mg total) by mouth every 6 (six) hours as needed for nausea or  vomiting.  . SUMAtriptan (IMITREX) 100 MG tablet Take 0.5 tablets (50 mg total) by mouth every 2 (two) hours as needed for migraine. May repeat in 2 hours if headache persists or recurs.  . varenicline (CHANTIX CONTINUING MONTH PAK) 1 MG tablet Take 1 tablet (1 mg total) by mouth 2 (two) times daily.  . varenicline (CHANTIX STARTING MONTH PAK) 0.5 MG X 11 & 1 MG X 42 tablet Take one 0.5 mg tablet by mouth once daily for 3 days, then increase to one 0.5 mg tablet twice daily for 4 days, then increase to one 1 mg tablet twice daily.  . [DISCONTINUED] FLUoxetine (PROZAC) 40 MG capsule Take 1 capsule (40 mg total) by mouth 2 (two) times daily.  . [DISCONTINUED] traZODone (DESYREL) 100 MG tablet Take 1  tablet (100 mg total) by mouth at bedtime as needed for sleep.   No facility-administered encounter medications on file as of 02/10/2018.     Recent Results (from the past 2160 hour(s))  Basic metabolic panel     Status: Abnormal   Collection Time: 11/24/17 11:17 AM  Result Value Ref Range   Sodium 137 135 - 145 mmol/L   Potassium 4.2 3.5 - 5.1 mmol/L   Chloride 109 98 - 111 mmol/L   CO2 21 (L) 22 - 32 mmol/L   Glucose, Bld 106 (H) 70 - 99 mg/dL   BUN 8 6 - 20 mg/dL   Creatinine, Ser 0.86 0.61 - 1.24 mg/dL   Calcium 9.6 8.9 - 10.3 mg/dL   GFR calc non Af Amer >60 >60 mL/min   GFR calc Af Amer >60 >60 mL/min    Comment: (NOTE) The eGFR has been calculated using the CKD EPI equation. This calculation has not been validated in all clinical situations. eGFR's persistently <60 mL/min signify possible Chronic Kidney Disease.    Anion gap 7 5 - 15    Comment: Performed at [facility] [address], [place], [place] [address]  CBC WITH DIFFERENTIAL     Status: None   Collection Time: 11/24/17 11:17 AM  Result Value Ref Range   WBC 8.8 4.0 - 10.5 K/uL   RBC 5.53 4.22 - 5.81 MIL/uL   Hemoglobin 15.9 13.0 - 17.0 g/dL   HCT 49.1 39.0 - 52.0 %   MCV 88.8 80.0 - 100.0 fL   MCH 28.8 26.0 - 34.0 pg   MCHC 32.4 30.0 - 36.0 g/dL   RDW 13.3 11.5 - 15.5 %   Platelets 338 150 - 400 K/uL   nRBC 0.0 0.0 - 0.2 %   Neutrophils Relative % 57 %   Neutro Abs 5.0 1.7 - 7.7 K/uL   Lymphocytes Relative 31 %   Lymphs Abs 2.8 0.7 - 4.0 K/uL   Monocytes Relative 9 %   Monocytes Absolute 0.8 0.1 - 1.0 K/uL   Eosinophils Relative 2 %   Eosinophils Absolute 0.2 0.0 - 0.5 K/uL   Basophils Relative 1 %   Basophils Absolute 0.0 0.0 - 0.1 K/uL   Immature Granulocytes 0 %   Abs Immature Granulocytes 0.03 0.00 - 0.07 K/uL    Comment: Performed at [facility], [address] [place], [place] [address]  Protime-INR     Status: None   Collection Time: 11/24/17 11:17 AM  Result Value Ref  Range   Prothrombin Time 13.0 11.4 - 15.2 seconds   INR 0.99     Comment: Performed at [facility] [address]  [place], [place] [address]  Surgical pcr screen     Status: None   Collection Time: 11/24/17 11:17 AM  Result Value Ref Range   MRSA, PCR NEGATIVE NEGATIVE   Staphylococcus aureus NEGATIVE NEGATIVE    Comment: (NOTE) The Xpert SA Assay (FDA approved for NASAL specimens in patients 72 years of age and older), is one component of a comprehensive surveillance program. It is not intended to diagnose infection nor to guide or monitor treatment. Performed at [facility], [place] [address] [place], [place] [address]     There were no vitals taken for this visit.   Review of Systems  Constitutional: Negative for fever.  Cardiovascular: Negative for chest pain.  Skin: Negative for rash.  Neurological: Negative for headaches.  Psychiatric/Behavioral: Negative for depression and suicidal ideas.    Mental Status Examination  Appearance: casual and cooperative Alert: Yes Attention: fair  Cooperative: Yes Eye Contact: Good Speech: coherent Psychomotor Activity: Normal Memory/Concentration: adequate Oriented: person, place, time/date and situation Mood: fair Affect: congruent Thought Processes and Associations: Coherent Fund of Knowledge: Fair Thought Content: Suicidal ideation and Homicidal ideation were denied. Denies hallucinations Insight: Fair Judgement: Fair  Diagnosis: Major depressive disorder recurrent moderate to severe. Generalized anxiety disorder. Insomnia. 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 Continue lexapro. Got refill this time from primary care  Insomnia:baseline. Not worse. Not on trazadone now Anxiety : manageable on lexapro Pain effects mood , some better since surgery  Reviewed meds,  FU 14m    NMerian Capron MD

## 2018-02-17 ENCOUNTER — Ambulatory Visit (INDEPENDENT_AMBULATORY_CARE_PROVIDER_SITE_OTHER): Payer: Medicare Other | Admitting: Osteopathic Medicine

## 2018-02-17 VITALS — BP 135/83 | HR 72 | Wt 229.0 lb

## 2018-02-17 DIAGNOSIS — I10 Essential (primary) hypertension: Secondary | ICD-10-CM

## 2018-02-17 MED ORDER — LORATADINE 10 MG PO TABS: 10.0000 mg | ORAL_TABLET | Freq: Every day | ORAL | 3 refills | Status: DC

## 2018-02-17 MED ORDER — FLUTICASONE PROPIONATE 50 MCG/ACT NA SUSP: 1.0000 | Freq: Every day | NASAL | 3 refills | Status: DC

## 2018-02-17 NOTE — Progress Notes (Signed)
BP Readings from Last 3 Encounters:  02/17/18 135/83  02/03/18 (!) 134/93  11/25/17 (!) 152/103    Contin[facility] current meds.  Will send allergy Rx

## 2018-02-17 NOTE — Progress Notes (Signed)
Established Patient Office Visit  Subjective:  Patient ID: [name], male    DOB: 01/10/1971  Age: 48 y.o. MRN: [id number]  CC:  Chief Complaint  Patient presents with  . Hypertension    HPI [name] presents to follow up on elevated blood pressure. Denies chest pain, shortness of breath or dizziness.   Past Medical History:  Diagnosis Date  . Anxiety   . Arthritis   . Asthma    history [id number]  . Depression   . Essential hypertension 12/11/2011  . Gout   . Headache   . History of hiatal hernia   . History of lithotripsy   . Hypertension   . Kidney stones   . Lumbar degenerative disc disease 12/11/2011  . Nephrolithiasis 12/11/2011  . Pericarditis   . Renal tubular acidosis   . Rheumatoid arthritis(714.0)     Past Surgical History:  Procedure Laterality Date  . ANTERIOR CERVICAL DECOMP/DISCECTOMY FUSION    . ANTERIOR CERVICAL DECOMP/DISCECTOMY FUSION N/A 11/25/2017   Procedure: Anterior Cervical Discectomy Fusion - Cervical Six-Cervical Seven, removal Cervical Five-Six plate;  Surgeon: Tia Alert, MD;  Location: [facility] OR;  Service: Neurosurgery;  Laterality: N/A;  Anterior Cervical Discectomy Fusion - Cervical Six-Cervical Seven, removal Cervical Five-Six plate  . CARPAL TUNNEL RELEASE Left 11/06/2015   Procedure: LEFT WRIST CARPAL TUNNEL RELEASE;  Surgeon: Bradly Bienenstock, MD;  Location: [facility] OR;  Service: Orthopedics;  Laterality: Left;  . HEMORROIDECTOMY    . KIDNEY STONE SURGERY    . WRIST FUSION Left 11/06/2015   Procedure: ARTHRODESIS LEFT WRIST;  Surgeon: Bradly Bienenstock, MD;  Location: [facility] OR;  Service: Orthopedics;  Laterality: Left;    Family History  Problem Relation Age of Onset  . Hypertension Father   . Alcohol abuse Father   . Heart attack Father 37  . Diabetes Mellitus II Father   . Coronary artery disease Father   . Diabetes Mellitus II Mother        [name] into a diabetic coma, per pt.  . Cancer - Colon Mother   . Coronary artery  disease Mother   . Heart attack Mother 32  . Ovarian cancer Mother   . Uterine cancer Mother   . Heart attack Brother   . Coronary artery disease Brother   . Heart murmur Sister   . Dementia Neg Hx   . Bipolar disorder Neg Hx   . Depression Neg Hx   . Drug abuse Neg Hx   . OCD Neg Hx     Social History   Socioeconomic History  . Marital status: Married    Spouse name: Not on file  . Number of children: Not on file  . Years of education: Not on file  . Highest education level: Not on file  Occupational History  . Not on file  Social Needs  . Financial resource strain: Not on file  . Food insecurity:    Worry: Not on file    Inability: Not on file  . Transportation needs:    Medical: Not on file    Non-medical: Not on file  Tobacco Use  . Smoking status: Current Every Day Smoker    Packs/day: 0.25    Years: 30.00    Pack years: 7.50    Types: Cigarettes  . Smokeless tobacco: Former Neurosurgeon    Types: Chew    Quit date: 48  . Tobacco comment: Rx written for Chantix from Dr. Lyn Hollingshead.  Substance and Sexual Activity  .  Alcohol use: No  . Drug use: No  . Sexual activity: Yes    Partners: Female  Lifestyle  . Physical activity:    Days per week: Not on file    Minutes per session: Not on file  . Stress: Not on file  Relationships  . Social connections:    Talks on phone: Not on file    Gets together: Not on file    Attends religious service: Not on file    Active member of club or organization: Not on file    Attends meetings of clubs or organizations: Not on file    Relationship status: Not on file  . Intimate partner violence:    Fear of current or ex partner: Not on file    Emotionally abused: Not on file    Physically abused: Not on file    Forced sexual activity: Not on file  Other Topics Concern  . Not on file  Social History Narrative  . Not on file    Outpatient Medications Prior to Visit  Medication Sig Dispense Refill  . albuterol (PROVENTIL  HFA;VENTOLIN HFA) 108 (90 Base) MCG/ACT inhaler Inhale 1-2 puffs into the lungs every 4 (four) hours as needed for wheezing or shortness of breath. 1 Inhaler 3  . AMBULATORY NON FORMULARY MEDICATION Medication Name: Gilmer Mor, use as needed.  Dx: Rheumatoid Arthritis 1 Units 0  . amLODipine (NORVASC) 10 MG tablet One tablet by mouth every day for blood pressure control. 90 tablet 3  . atorvastatin (LIPITOR) 40 MG tablet Take 1 tablet (40 mg total) by mouth daily. 90 tablet 3  . cyclobenzaprine (FLEXERIL) 10 MG tablet Take 1 tablet (10 mg total) by mouth 3 (three) times daily as needed for muscle spasms. 90 tablet 5  . escitalopram (LEXAPRO) 10 MG tablet Take 1 tablet (10 mg total) by mouth daily. 30 tablet 3  . fluticasone furoate-vilanterol (BREO ELLIPTA) 200-25 MCG/INH AEPB Inhale 1 puff into the lungs daily. 60 each 11  . methotrexate (RHEUMATREX) 2.5 MG tablet Take 20 mg by mouth every Tuesday. Caution:Chemotherapy. Protect from light.     Marland Kitchen omeprazole (PRILOSEC) 40 MG capsule Take 1 capsule (40 mg total) by mouth daily. 90 capsule 3  . pregabalin (LYRICA) 50 MG capsule Take 1 capsule (50 mg total) by mouth daily. 90 capsule 3  . promethazine (PHENERGAN) 25 MG tablet Take 1 tablet (25 mg total) by mouth every 6 (six) hours as needed for nausea or vomiting. 60 tablet 3  . SUMAtriptan (IMITREX) 100 MG tablet Take 0.5 tablets (50 mg total) by mouth every 2 (two) hours as needed for migraine. May repeat in 2 hours if headache persists or recurs. 10 tablet 3  . varenicline (CHANTIX CONTINUING MONTH PAK) 1 MG tablet Take 1 tablet (1 mg total) by mouth 2 (two) times daily. 60 tablet 1  . varenicline (CHANTIX STARTING MONTH PAK) 0.5 MG X 11 & 1 MG X 42 tablet Take one 0.5 mg tablet by mouth once daily for 3 days, then increase to one 0.5 mg tablet twice daily for 4 days, then increase to one 1 mg tablet twice daily. 53 tablet 0   No facility-administered medications prior to visit.     Allergies  Allergen  Reactions  . Lisinopril Cough  . Benadryl [Diphenhydramine Hcl] Anxiety    ROS Review of Systems    Objective:    Physical Exam  BP 135/83   Pulse 72   Wt 229 lb (103.9 kg)  SpO2 98%   BMI 35.87 kg/m  Wt Readings from Last 3 Encounters:  02/17/18 229 lb (103.9 kg)  02/03/18 232 lb 3.2 oz (105.3 kg)  11/24/17 227 lb 11.2 oz (103.3 kg)     Health Maintenance Due  Topic Date Due  . HIV Screening  04/30/1984    There are no preventive care reminders to display for this patient.  Lab Results  Component Value Date   TSH 1.48 12/05/2015   Lab Results  Component Value Date   WBC 8.8 11/24/2017   HGB 15.9 11/24/2017   HCT 49.1 11/24/2017   MCV 88.8 11/24/2017   PLT 338 11/24/2017   Lab Results  Component Value Date   NA 137 11/24/2017   K 4.2 11/24/2017   CO2 21 (L) 11/24/2017   GLUCOSE 106 (H) 11/24/2017   BUN 8 11/24/2017   CREATININE 0.86 11/24/2017   BILITOT 0.7 01/06/2017   ALKPHOS 52 01/06/2017   AST 28 01/06/2017   ALT 29 01/06/2017   PROT 7.7 01/06/2017   ALBUMIN 4.1 01/06/2017   CALCIUM 9.6 11/24/2017   ANIONGAP 7 11/24/2017   Lab Results  Component Value Date   CHOL 195 12/05/2015   Lab Results  Component Value Date   HDL 26 (L) 12/05/2015   Lab Results  Component Value Date   LDLCALC 144 (H) 12/05/2015   Lab Results  Component Value Date   TRIG 123 12/05/2015   Lab Results  Component Value Date   CHOLHDL 7.5 (H) 12/05/2015   No results found for: HGBA1C    Assessment & Plan:  Hypertension - Blood pressure within normal range. Continue current medication. Follow up in 6 months with Dr Lyn HollingsheadAlexander.     Problem List Items Addressed This Visit    None      No orders of the defined types were placed in this encounter.   Follow-up: No follow-ups on file.    Esmond Harpsuttle, Cathlene Gardella Hale, CMA

## 2018-03-01 ENCOUNTER — Ambulatory Visit: Payer: Self-pay | Admitting: Gastroenterology

## 2018-03-07 ENCOUNTER — Encounter: Payer: Self-pay | Admitting: Osteopathic Medicine

## 2018-03-09 DIAGNOSIS — G894 Chronic pain syndrome: Secondary | ICD-10-CM | POA: Diagnosis not present

## 2018-03-09 DIAGNOSIS — M47817 Spondylosis without myelopathy or radiculopathy, lumbosacral region: Secondary | ICD-10-CM | POA: Diagnosis not present

## 2018-03-09 DIAGNOSIS — M069 Rheumatoid arthritis, unspecified: Secondary | ICD-10-CM | POA: Diagnosis not present

## 2018-03-09 DIAGNOSIS — Z79891 Long term (current) use of opiate analgesic: Secondary | ICD-10-CM | POA: Diagnosis not present

## 2018-03-09 DIAGNOSIS — M47812 Spondylosis without myelopathy or radiculopathy, cervical region: Secondary | ICD-10-CM | POA: Diagnosis not present

## 2018-04-13 DIAGNOSIS — M47812 Spondylosis without myelopathy or radiculopathy, cervical region: Secondary | ICD-10-CM | POA: Diagnosis not present

## 2018-04-13 DIAGNOSIS — M069 Rheumatoid arthritis, unspecified: Secondary | ICD-10-CM | POA: Diagnosis not present

## 2018-04-13 DIAGNOSIS — G894 Chronic pain syndrome: Secondary | ICD-10-CM | POA: Diagnosis not present

## 2018-04-13 DIAGNOSIS — M47817 Spondylosis without myelopathy or radiculopathy, lumbosacral region: Secondary | ICD-10-CM | POA: Diagnosis not present

## 2018-05-03 DIAGNOSIS — G5601 Carpal tunnel syndrome, right upper limb: Secondary | ICD-10-CM | POA: Diagnosis not present

## 2018-05-03 DIAGNOSIS — M5412 Radiculopathy, cervical region: Secondary | ICD-10-CM | POA: Diagnosis not present

## 2018-05-05 ENCOUNTER — Ambulatory Visit (INDEPENDENT_AMBULATORY_CARE_PROVIDER_SITE_OTHER): Payer: Medicare Other | Admitting: Psychiatry

## 2018-05-05 ENCOUNTER — Encounter (HOSPITAL_COMMUNITY): Payer: Self-pay | Admitting: Psychiatry

## 2018-05-05 DIAGNOSIS — F331 Major depressive disorder, recurrent, moderate: Secondary | ICD-10-CM

## 2018-05-05 DIAGNOSIS — F411 Generalized anxiety disorder: Secondary | ICD-10-CM

## 2018-05-05 DIAGNOSIS — F4323 Adjustment disorder with mixed anxiety and depressed mood: Secondary | ICD-10-CM | POA: Diagnosis not present

## 2018-05-05 DIAGNOSIS — F5102 Adjustment insomnia: Secondary | ICD-10-CM

## 2018-05-05 NOTE — Progress Notes (Signed)
Patient ID: [name], male   DOB: 23-Nov-1970, 48 y.o.   MRN: [id number] [facility] Follow-up Outpatient Visit  [name] [id number] 48 y.o.  05/05/2018  Chief Complaint: follow up for depression and anxiety.     History of Present Illness:   I connected with Jamey Ripa on 05/05/18 at 10:00 AM EDT by telephone and verified that I am speaking with the correct person using two identifiers.   I discussed the limitations, risks, security and privacy concerns of performing an evaluation and management service by telephone and the availability of in person appointments. I also discussed with the patient that there may be a patient responsible charge related to this service. The patient expressed understanding and agreed to proceed. He is doing fair, staying at home mostly Tolerating lexapro, this time got from primay[facility] care no side effects     Had cervical surgery October 2019, pain and tingling is better, much relieved, still on pain meds  Modifying factors: relationship, disability income . Severity:not worse Depression; fair No psychosis      Past Medical History:  Diagnosis Date  . Anxiety   . Arthritis   . Asthma    history DX:AJOINOMVE  . Depression   . Essential hypertension 12/11/2011  . Gout   . Headache   . History of hiatal hernia   . History of lithotripsy   . Hypertension   . Kidney stones   . Lumbar degenerative disc disease 12/11/2011  . Nephrolithiasis 12/11/2011  . Pericarditis   . Renal tubular acidosis   . Rheumatoid arthritis(714.0)    Family History  Problem Relation Age of Onset  . Hypertension Father   . Alcohol abuse Father   . Heart attack Father 74  . Diabetes Mellitus II Father   . Coronary artery disease Father   . Diabetes Mellitus II Mother        Micah Flesher into a diabetic coma, per pt.  . Cancer - Colon Mother   . Coronary artery disease Mother   . Heart attack Mother 65  . Ovarian cancer Mother   . Uterine  cancer Mother   . Heart attack Brother   . Coronary artery disease Brother   . Heart murmur Sister   . Dementia Neg Hx   . Bipolar disorder Neg Hx   . Depression Neg Hx   . Drug abuse Neg Hx   . OCD Neg Hx     Outpatient Encounter Medications as of 05/05/2018  Medication Sig  . albuterol (PROVENTIL HFA;VENTOLIN HFA) 108 (90 Base) MCG/ACT inhaler Inhale 1-2 puffs into the lungs every 4 (four) hours as needed for wheezing or shortness of breath.  . AMBULATORY NON FORMULARY MEDICATION Medication Name: Gilmer Mor, use as needed.  Dx: Rheumatoid Arthritis  . amLODipine (NORVASC) 10 MG tablet One tablet by mouth every day for blood pressure control.  Marland Kitchen atorvastatin (LIPITOR) 40 MG tablet Take 1 tablet (40 mg total) by mouth daily.  . cyclobenzaprine (FLEXERIL) 10 MG tablet Take 1 tablet (10 mg total) by mouth 3 (three) times daily as needed for muscle spasms.  Marland Kitchen escitalopram (LEXAPRO) 10 MG tablet Take 1 tablet (10 mg total) by mouth daily.  . fluticasone (FLONASE) 50 MCG/ACT nasal spray Place 1-2 sprays into both nostrils daily.  . fluticasone furoate-vilanterol (BREO ELLIPTA) 200-25 MCG/INH AEPB Inhale 1 puff into the lungs daily.  Marland Kitchen loratadine (CLARITIN) 10 MG tablet Take 1 tablet (10 mg total) by mouth daily.  . methotrexate (RHEUMATREX)  2.5 MG tablet Take 20 mg by mouth every Tuesday. Caution:Chemotherapy. Protect from light.   Marland Kitchen omeprazole (PRILOSEC) 40 MG capsule Take 1 capsule (40 mg total) by mouth daily.  . pregabalin (LYRICA) 50 MG capsule Take 1 capsule (50 mg total) by mouth daily.  . promethazine (PHENERGAN) 25 MG tablet Take 1 tablet (25 mg total) by mouth every 6 (six) hours as needed for nausea or vomiting.  . SUMAtriptan (IMITREX) 100 MG tablet Take 0.5 tablets (50 mg total) by mouth every 2 (two) hours as needed for migraine. May repeat in 2 hours if headache persists or recurs.  . varenicline (CHANTIX CONTINUING MONTH PAK) 1 MG tablet Take 1 tablet (1 mg total) by mouth 2 (two) times  daily.  . varenicline (CHANTIX STARTING MONTH PAK) 0.5 MG X 11 & 1 MG X 42 tablet Take one 0.5 mg tablet by mouth once daily for 3 days, then increase to one 0.5 mg tablet twice daily for 4 days, then increase to one 1 mg tablet twice daily.  . [DISCONTINUED] FLUoxetine (PROZAC) 40 MG capsule Take 1 capsule (40 mg total) by mouth 2 (two) times daily.  . [DISCONTINUED] traZODone (DESYREL) 100 MG tablet Take 1 tablet (100 mg total) by mouth at bedtime as needed for sleep.   No facility-administered encounter medications on file as of 05/05/2018.     No results found for this or any previous visit (from the past 2160 hour(s)).  There were no vitals taken for this visit.   Review of Systems  Cardiovascular: Negative for chest pain.  Skin: Negative for itching.  Neurological: Negative for headaches.  Psychiatric/Behavioral: Negative for depression and suicidal ideas.    Mental Status Examination  Appearance: Alert: Yes Attention: fair  Cooperative: Yes Eye Contact: Speech: coherent Psychomotor Activity: Normal Memory/Concentration: adequate Oriented: person, place, time/date and situation Mood:fair Affect: congruent Thought Processes and Associations: Coherent Fund of Knowledge: Fair Thought Content: Suicidal ideation and Homicidal ideation were denied. Denies hallucinations Insight: Fair Judgement: Fair  Diagnosis: Major depressive disorder recurrent moderate to severe. Generalized anxiety disorder. Insomnia. 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 Got from primary care  Insomnia:baseline. Not worse. Not on trazadone now Anxiety : manageable on lexapro Pain effects mood , some better since surgery  I discussed the assessment and treatment plan with the patient. The patient was provided an opportunity to ask questions and all were answered. The patient agreed with the plan and demonstrated an  understanding of the instructions.   The patient was advised to call back or seek an in-person evaluation if the symptoms worsen or if the condition fails to improve as anticipated.  I provided 15 minutes of non-face-to-face time during this encounter. Reviewed meds,  FU 39m.    Thresa Ross, MD

## 2018-05-31 DIAGNOSIS — M47817 Spondylosis without myelopathy or radiculopathy, lumbosacral region: Secondary | ICD-10-CM | POA: Diagnosis not present

## 2018-05-31 DIAGNOSIS — M069 Rheumatoid arthritis, unspecified: Secondary | ICD-10-CM | POA: Diagnosis not present

## 2018-05-31 DIAGNOSIS — M47812 Spondylosis without myelopathy or radiculopathy, cervical region: Secondary | ICD-10-CM | POA: Diagnosis not present

## 2018-05-31 DIAGNOSIS — G894 Chronic pain syndrome: Secondary | ICD-10-CM | POA: Diagnosis not present

## 2018-05-31 DIAGNOSIS — G4700 Insomnia, unspecified: Secondary | ICD-10-CM | POA: Diagnosis not present

## 2018-07-04 DIAGNOSIS — M47812 Spondylosis without myelopathy or radiculopathy, cervical region: Secondary | ICD-10-CM | POA: Diagnosis not present

## 2018-07-04 DIAGNOSIS — M069 Rheumatoid arthritis, unspecified: Secondary | ICD-10-CM | POA: Diagnosis not present

## 2018-07-04 DIAGNOSIS — G4700 Insomnia, unspecified: Secondary | ICD-10-CM | POA: Diagnosis not present

## 2018-07-04 DIAGNOSIS — M47817 Spondylosis without myelopathy or radiculopathy, lumbosacral region: Secondary | ICD-10-CM | POA: Diagnosis not present

## 2018-07-04 DIAGNOSIS — G894 Chronic pain syndrome: Secondary | ICD-10-CM | POA: Diagnosis not present

## 2018-08-04 ENCOUNTER — Ambulatory Visit: Payer: Self-pay | Admitting: Osteopathic Medicine

## 2018-08-04 DIAGNOSIS — M069 Rheumatoid arthritis, unspecified: Secondary | ICD-10-CM | POA: Diagnosis not present

## 2018-08-04 DIAGNOSIS — M47817 Spondylosis without myelopathy or radiculopathy, lumbosacral region: Secondary | ICD-10-CM | POA: Diagnosis not present

## 2018-08-04 DIAGNOSIS — G894 Chronic pain syndrome: Secondary | ICD-10-CM | POA: Diagnosis not present

## 2018-08-04 DIAGNOSIS — M47812 Spondylosis without myelopathy or radiculopathy, cervical region: Secondary | ICD-10-CM | POA: Diagnosis not present

## 2018-08-09 ENCOUNTER — Encounter (HOSPITAL_COMMUNITY): Payer: Self-pay | Admitting: Psychiatry

## 2018-08-09 ENCOUNTER — Ambulatory Visit (INDEPENDENT_AMBULATORY_CARE_PROVIDER_SITE_OTHER): Payer: Medicare Other | Admitting: Psychiatry

## 2018-08-09 DIAGNOSIS — F411 Generalized anxiety disorder: Secondary | ICD-10-CM

## 2018-08-09 DIAGNOSIS — F5102 Adjustment insomnia: Secondary | ICD-10-CM | POA: Diagnosis not present

## 2018-08-09 DIAGNOSIS — F331 Major depressive disorder, recurrent, moderate: Secondary | ICD-10-CM | POA: Diagnosis not present

## 2018-08-09 MED ORDER — ESCITALOPRAM OXALATE 10 MG PO TABS: 10.0000 mg | ORAL_TABLET | Freq: Every day | ORAL | 3 refills | Status: DC

## 2018-08-09 NOTE — Progress Notes (Signed)
Patient ID: [name], male   DOB: Dec 28, 1970, 48 y.o.   MRN: [id number] [facility] Follow-up Outpatient Visit  IHAN PAT [id number] 48 y.o.  08/09/2018  Chief Complaint: follow up for depression and anxiety.   History of Present Illness:    I connected with Fenton Malling on 08/09/18 at 10:00 AM EDT by telephone and verified that I am speaking with the correct person using two identifiers.   I discussed the limitations, risks, security and privacy concerns of performing an evaluation and management service by telephone and the availability of in person appointments. I also discussed with the patient that there may be a patient responsible charge related to this service. The patient expressed understanding and agreed to proceed.   Doing fair, tolerating lexapro  Modifying factors: relationship, disability income . Severity:not worse Depression; fair No psychosis      Past Medical History:  Diagnosis Date  . Anxiety   . Arthritis   . Asthma    history AS:[id number]  . Depression   . Essential hypertension 12/11/2011  . Gout   . Headache   . History of hiatal hernia   . History of lithotripsy   . Hypertension   . Kidney stones   . Lumbar degenerative disc disease 12/11/2011  . Nephrolithiasis 12/11/2011  . Pericarditis   . Renal tubular acidosis   . Rheumatoid arthritis(714.0)    Family History  Problem Relation Age of Onset  . Hypertension Father   . Alcohol abuse Father   . Heart attack Father 27  . Diabetes Mellitus II Father   . Coronary artery disease Father   . Diabetes Mellitus II Mother        Martin Majestic into a diabetic coma, per pt.  . Cancer - Colon Mother   . Coronary artery disease Mother   . Heart attack Mother 68  . Ovarian cancer Mother   . Uterine cancer Mother   . Heart attack Brother   . Coronary artery disease Brother   . Heart murmur Sister   . Dementia Neg Hx   . Bipolar disorder Neg Hx   . Depression Neg Hx   .  Drug abuse Neg Hx   . OCD Neg Hx     Outpatient Encounter Medications as of 08/09/2018  Medication Sig  . albuterol (PROVENTIL HFA;VENTOLIN HFA) 108 (90 Base) MCG/ACT inhaler Inhale 1-2 puffs into the lungs every 4 (four) hours as needed for wheezing or shortness of breath.  . AMBULATORY NON FORMULARY MEDICATION Medication Name: Kasandra Knudsen, use as needed.  Dx: Rheumatoid Arthritis  . amLODipine (NORVASC) 10 MG tablet One tablet by mouth every day for blood pressure control.  Marland Kitchen atorvastatin (LIPITOR) 40 MG tablet Take 1 tablet (40 mg total) by mouth daily.  . cyclobenzaprine (FLEXERIL) 10 MG tablet Take 1 tablet (10 mg total) by mouth 3 (three) times daily as needed for muscle spasms.  Marland Kitchen escitalopram (LEXAPRO) 10 MG tablet Take 1 tablet (10 mg total) by mouth daily.  . fluticasone (FLONASE) 50 MCG/ACT nasal spray Place 1-2 sprays into both nostrils daily.  . fluticasone furoate-vilanterol (BREO ELLIPTA) 200-25 MCG/INH AEPB Inhale 1 puff into the lungs daily.  Marland Kitchen loratadine (CLARITIN) 10 MG tablet Take 1 tablet (10 mg total) by mouth daily.  . methotrexate (RHEUMATREX) 2.5 MG tablet Take 20 mg by mouth every Tuesday. Caution:Chemotherapy. Protect from light.   Marland Kitchen omeprazole (PRILOSEC) 40 MG capsule Take 1 capsule (40 mg total) by mouth daily.  . pregabalin (  LYRICA) 50 MG capsule Take 1 capsule (50 mg total) by mouth daily.  . promethazine (PHENERGAN) 25 MG tablet Take 1 tablet (25 mg total) by mouth every 6 (six) hours as needed for nausea or vomiting.  . SUMAtriptan (IMITREX) 100 MG tablet Take 0.5 tablets (50 mg total) by mouth every 2 (two) hours as needed for migraine. May repeat in 2 hours if headache persists or recurs.  . varenicline (CHANTIX CONTINUING MONTH PAK) 1 MG tablet Take 1 tablet (1 mg total) by mouth 2 (two) times daily.  . varenicline (CHANTIX STARTING MONTH PAK) 0.5 MG X 11 & 1 MG X 42 tablet Take one 0.5 mg tablet by mouth once daily for 3 days, then increase to one 0.5 mg tablet twice  daily for 4 days, then increase to one 1 mg tablet twice daily.  . [DISCONTINUED] escitalopram (LEXAPRO) 10 MG tablet Take 1 tablet (10 mg total) by mouth daily.  . [DISCONTINUED] FLUoxetine (PROZAC) 40 MG capsule Take 1 capsule (40 mg total) by mouth 2 (two) times daily.  . [DISCONTINUED] traZODone (DESYREL) 100 MG tablet Take 1 tablet (100 mg total) by mouth at bedtime as needed for sleep.   No facility-administered encounter medications on file as of 08/09/2018.     No results found for this or any previous visit (from the past 2160 hour(s)).  There were no vitals taken for this visit.   Review of Systems  Cardiovascular: Negative for chest pain.  Skin: Negative for itching.  Neurological: Negative for headaches.  Psychiatric/Behavioral: Negative for depression and suicidal ideas.    Mental Status Examination  Appearance: Alert: Yes Attention: fair  Cooperative: Yes Eye Contact: Speech: coherent Psychomotor Activity: Normal Memory/Concentration: adequate Oriented: person, place, time/date and situation Mood: fair Affect: congruent Thought Processes and Associations: Coherent Fund of Knowledge: Fair Thought Content: Suicidal ideation and Homicidal ideation were denied. Denies hallucinations Insight: Fair Judgement: Fair  Diagnosis: Major depressive disorder recurrent moderate to severe. Generalized anxiety disorder. Insomnia. 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 Continue lexapro  Insomnia:baseline. Not worse. Not on trazadone now Anxiety : manageable on lexapro Pain effects mood , some better since surgery  I discussed the assessment and treatment plan with the patient. The patient was provided an opportunity to ask questions and all were answered. The patient agreed with the plan and demonstrated an understanding of the instructions.   The patient was advised to call back or seek an in-person  evaluation if the symptoms worsen or if the condition fails to improve as anticipated.  I provided 15 minutes of non-face-to-face time during this encounter. Reviewed meds,  FU 52m.    Thresa Ross, MD

## 2018-09-08 DIAGNOSIS — M069 Rheumatoid arthritis, unspecified: Secondary | ICD-10-CM | POA: Diagnosis not present

## 2018-09-08 DIAGNOSIS — M47812 Spondylosis without myelopathy or radiculopathy, cervical region: Secondary | ICD-10-CM | POA: Diagnosis not present

## 2018-09-08 DIAGNOSIS — G894 Chronic pain syndrome: Secondary | ICD-10-CM | POA: Diagnosis not present

## 2018-09-08 DIAGNOSIS — M47817 Spondylosis without myelopathy or radiculopathy, lumbosacral region: Secondary | ICD-10-CM | POA: Diagnosis not present

## 2018-10-08 IMAGING — MR MR HEAD W/O CM
9 of 10 series · 37 of 48 positions shown · non-contrast
Comparison: Head CT 12/31/2016

CLINICAL DATA: Headache and dizziness

EXAM:
MRI HEAD WITHOUT CONTRAST
TECHNIQUE: Multiplanar, multiecho pulse sequences of the brain and surrounding
structures were obtained without intravenous contrast.

[Series 3: DWI · axial · 3.0mm · 0.94mm/px · z∈[-48,+98]mm · 8 of 100 slices shown (1 of 2)]
[im 1/100]
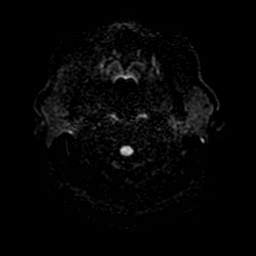
[im 15/100]
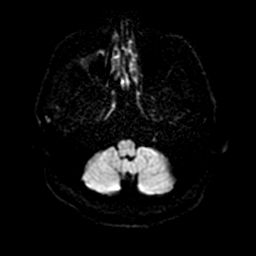
[im 29/100]
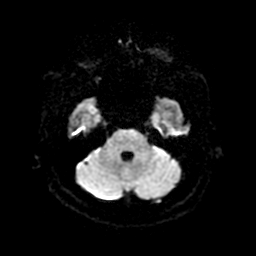
[im 43/100]
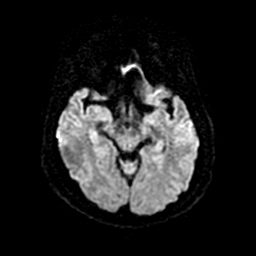
[im 57/100]
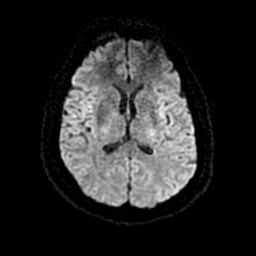
[im 71/100]
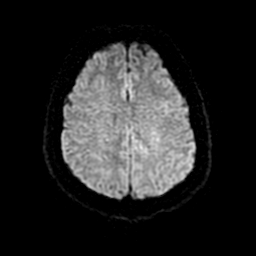
[im 85/100]
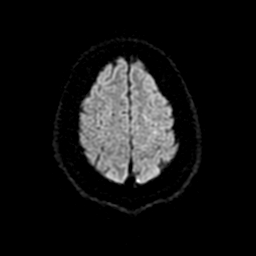
[im 100/100]
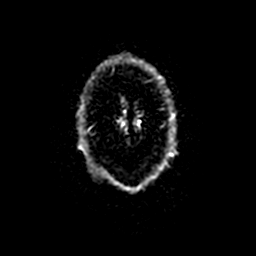

[Series 4: DWI · coronal · 4.0mm · 0.94mm/px · 7 of 72 slices shown (2 of 2)]
[im 1/72]
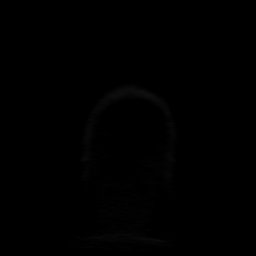
[im 12/72]
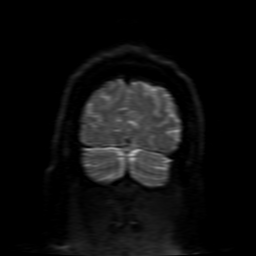
[im 24/72]
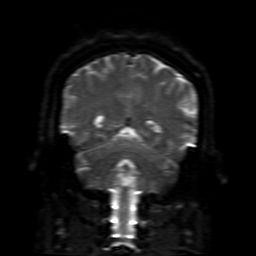
[im 36/72]
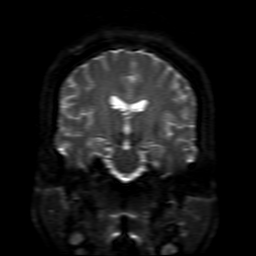
[im 48/72]
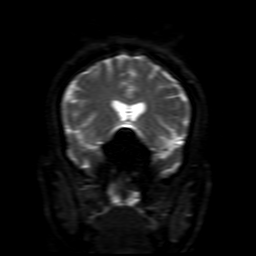
[im 60/72]
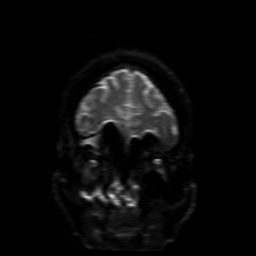
[im 72/72]
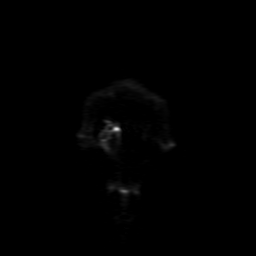

[Series 5: FLAIR · sagittal · 5.0mm · 0.47mm/px · 2 of 24 slices shown (1 of 2)]
[im 1/24]
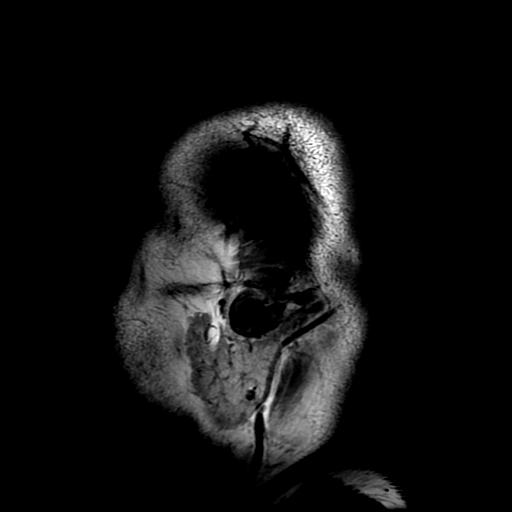
[im 24/24]
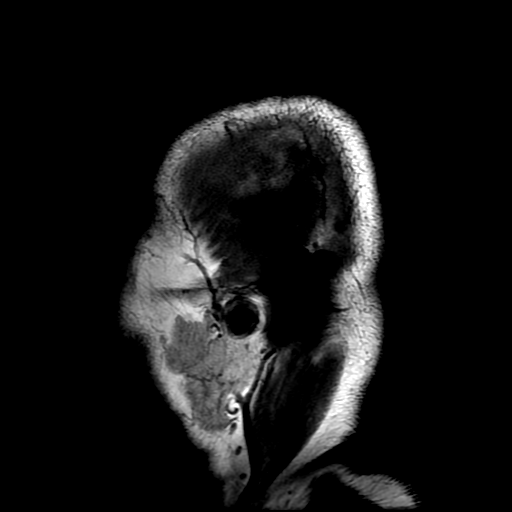

[Series 6: T2 · axial · 5.0mm · 0.47mm/px · z∈[-63,+105]mm · 3 of 29 slices shown (1 of 2)]
[im 1/29]
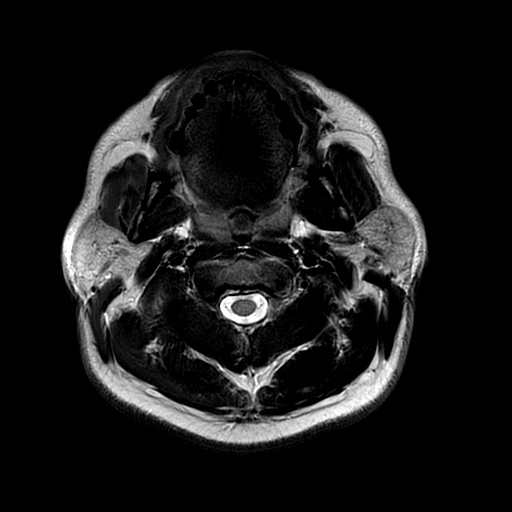
[im 15/29]
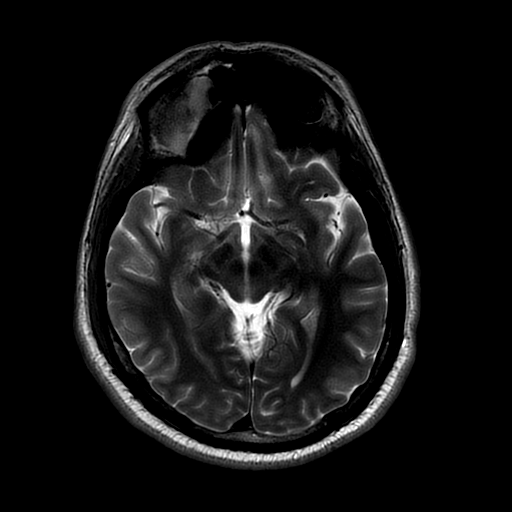
[im 29/29]
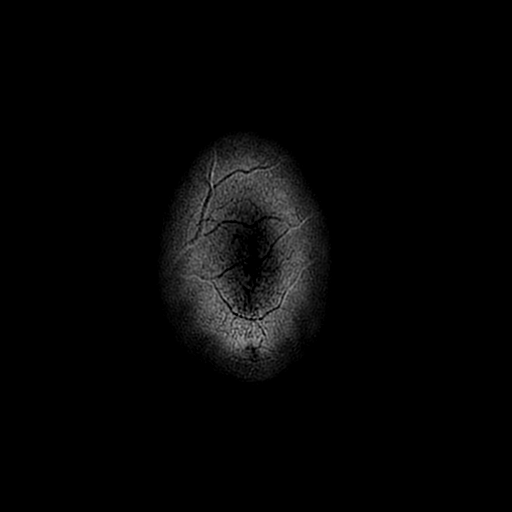

[Series 7: (person_name) · axial · 3.0mm · 0.47mm/px · z∈[-46,-10]mm · 3 of 100 slices shown]
[im 1/100]
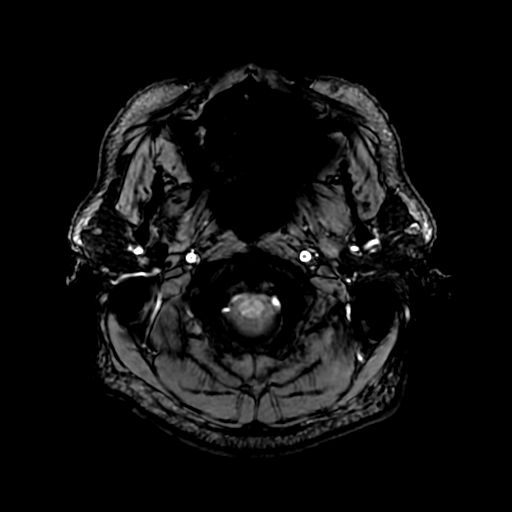
[im 13/100]
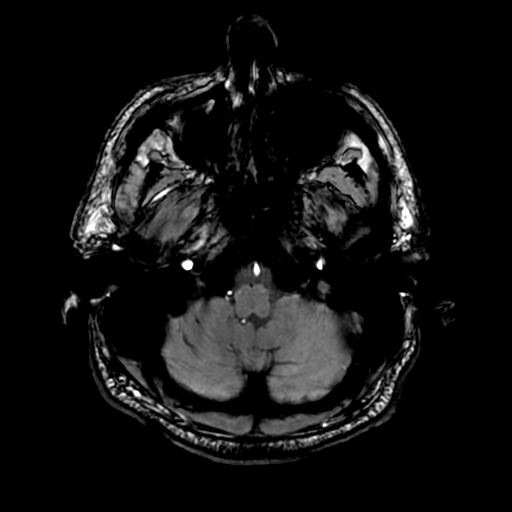
[im 25/100]
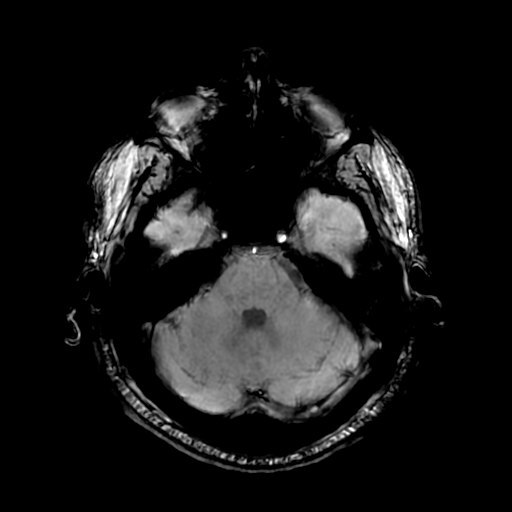

[Series 9: T2 · coronal · 5.0mm · 0.43mm/px · 3 of 30 slices shown (2 of 2)]
[im 1/30]
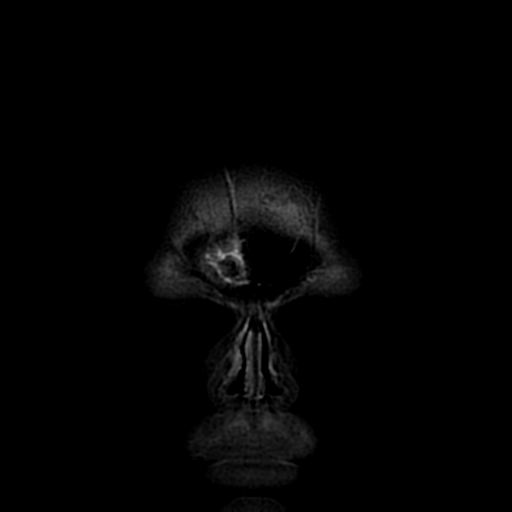
[im 15/30]
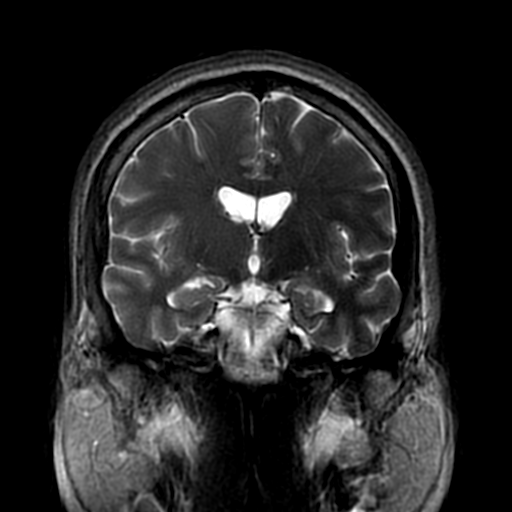
[im 30/30]
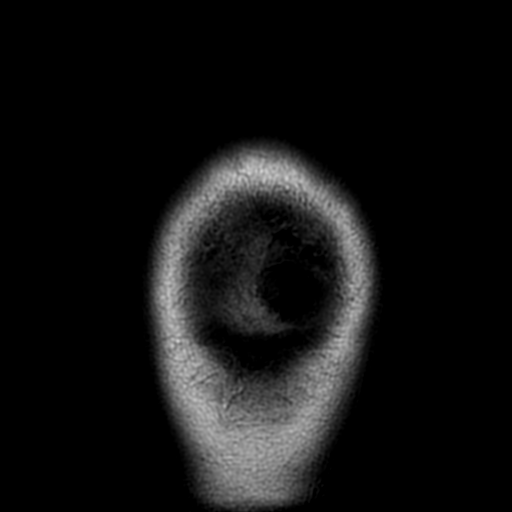

[Series 10: FLAIR · axial · 3.0mm · 0.82mm/px · z∈[-54,+108]mm · 3 of 28 slices shown (2 of 2)]
[im 1/28]
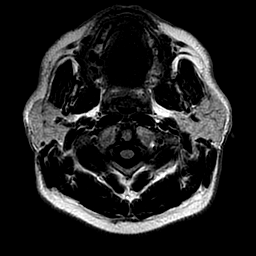
[im 14/28]
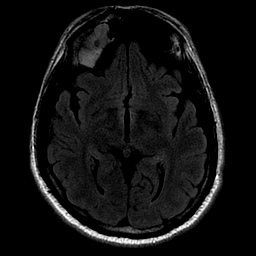
[im 28/28]
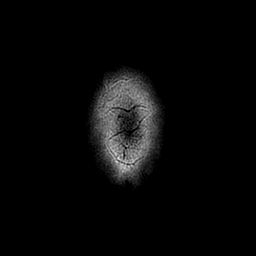

[Series 350: ADC · axial · 3.0mm · 0.94mm/px · z∈[-48,+98]mm · 5 of 50 slices shown (1 of 2)]
[im 1/50]
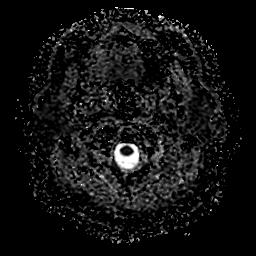
[im 13/50]
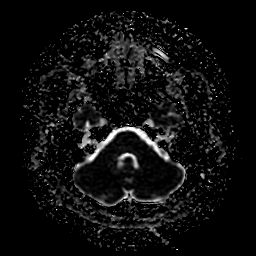
[im 25/50]
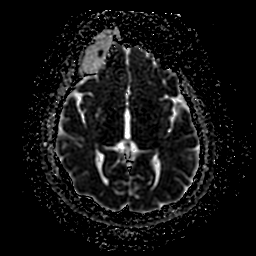
[im 37/50]
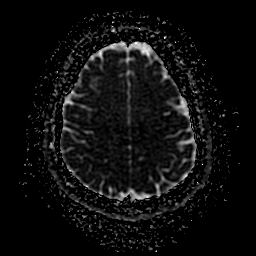
[im 50/50]
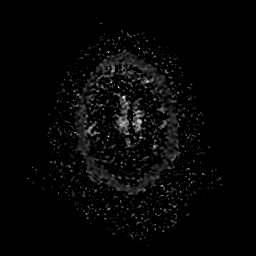

[Series 450: ADC · coronal · 4.0mm · 0.94mm/px · 3 of 36 slices shown (2 of 2)]
[im 1/36]
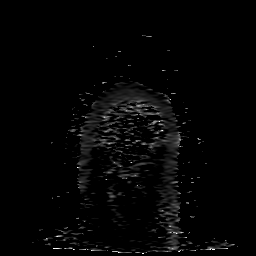
[im 18/36]
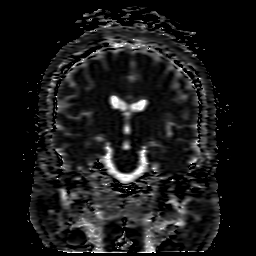
[im 36/36]
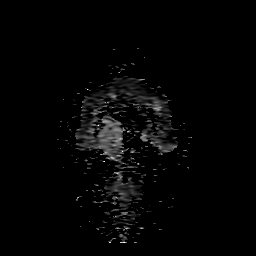

[37 of 48 positions shown; findings below may reference images not displayed]

FINDINGS: Brain: The midline structures are normal. There is no acute infarct
or acute hemorrhage. No mass lesion, hydrocephalus, dural
abnormality or extra-axial collection. The brain parenchymal signal
is normal. No age-advanced or lobar predominant atrophy. No chronic
microhemorrhage or superficial siderosis.

Vascular: Major intracranial arterial and venous sinus flow voids
are preserved.

Skull and upper cervical spine: The visualized skull base,
calvarium, upper cervical spine and extracranial soft tissues are
normal.

Sinuses/Orbits: Chronic right frontal and maxillary sinus disease.
Normal orbits.
IMPRESSION: Normal MRI of the brain.

## 2018-10-08 IMAGING — CT CT ANGIO CHEST
2 of 6 series · 19 of 46 positions shown · IV contrast (APPLIED)
Comparison: Chest radiograph 01/06/2017

CLINICAL DATA: Short of breath for 2 weeks.  Positive D-dimer

EXAM:
CT ANGIOGRAPHY CHEST WITH CONTRAST
TECHNIQUE: Multidetector CT imaging of the chest was performed using the
standard protocol during bolus administration of intravenous
contrast. Multiplanar CT image reconstructions and MIPs were
obtained to evaluate the vascular anatomy.
CONTRAST:  <See Chart> [id number]-[id number] IOPAMIDOL ([id number]) INJECTION
76%

[Series 7: thins · axial · 0.69mm/px · z∈[-185,+104]mm · 16 of 452 slices shown]
[im 20/452  lung]
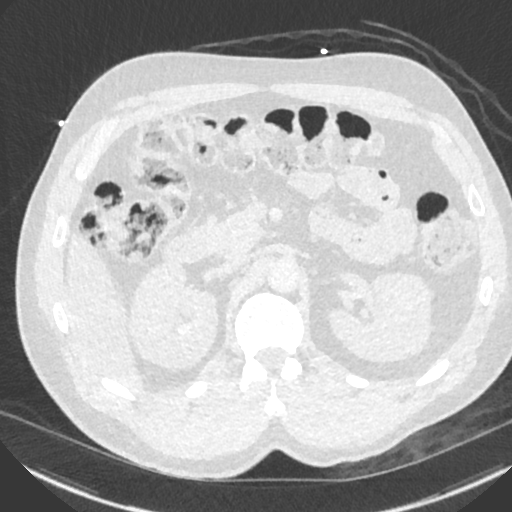
[im 59/452  soft-tissue]
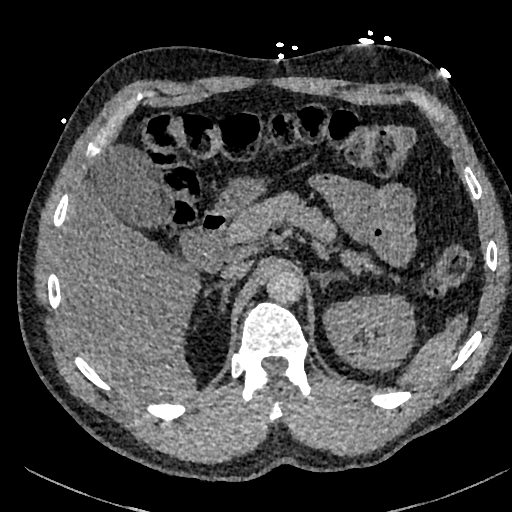
[im 79/452  lung]
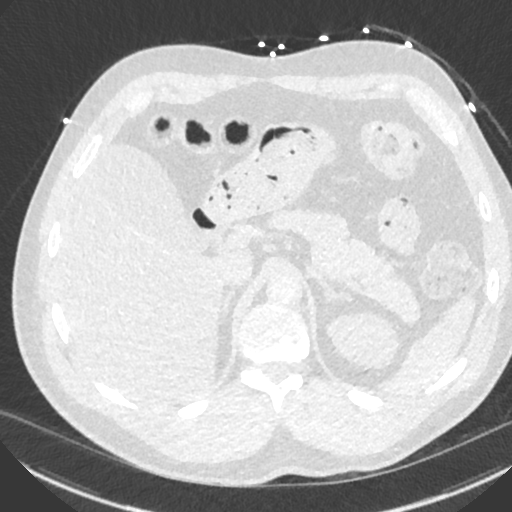
[im 99/452  soft-tissue]
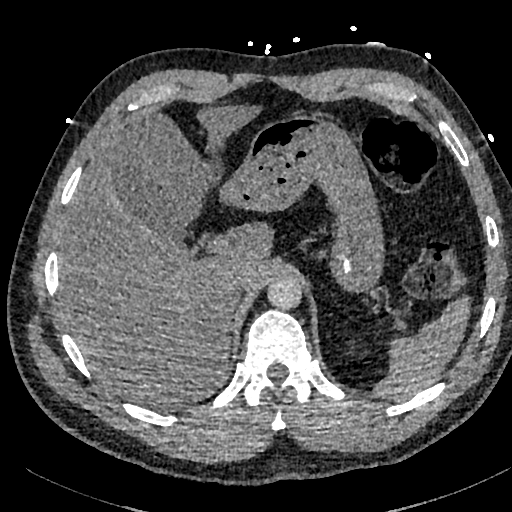
[im 138/452  lung]
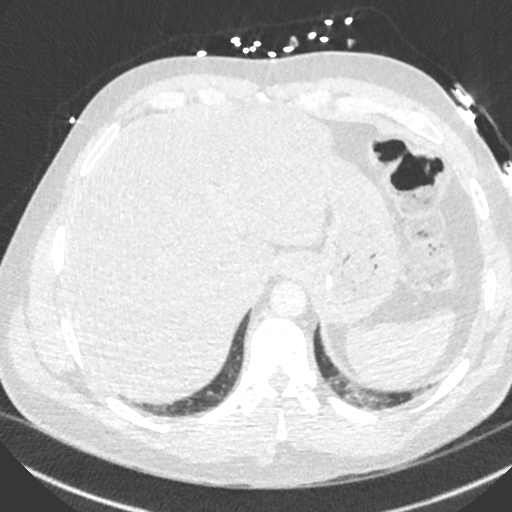
[im 157/452  soft-tissue]
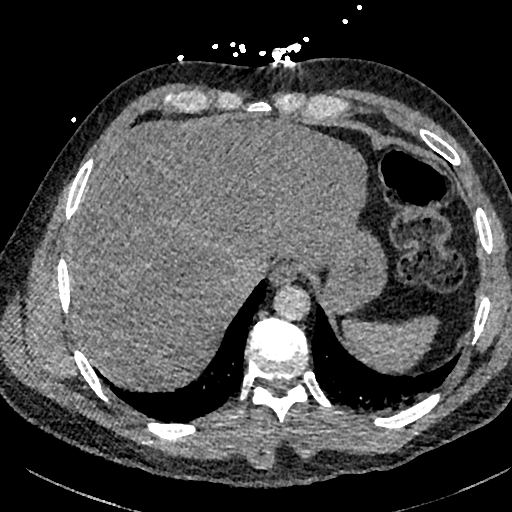
[im 177/452  lung]
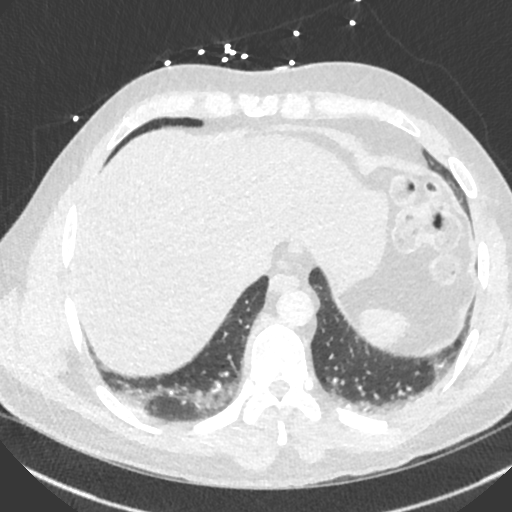
[im 216/452  soft-tissue]
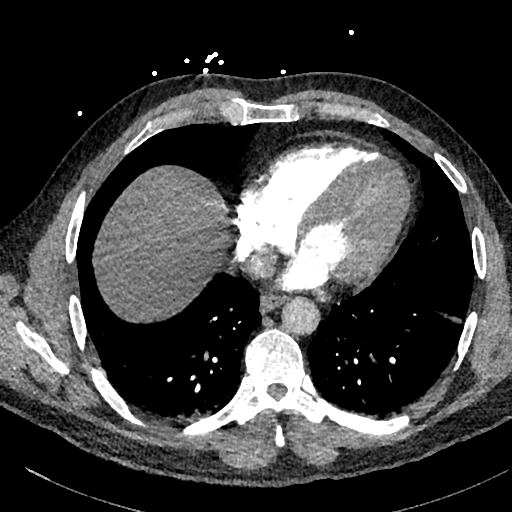
[im 236/452  lung]
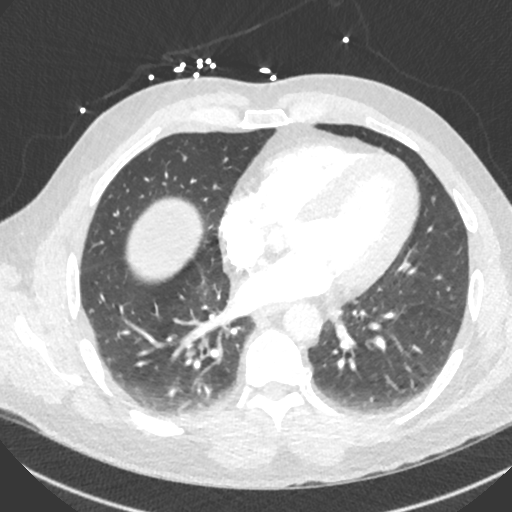
[im 275/452  soft-tissue]
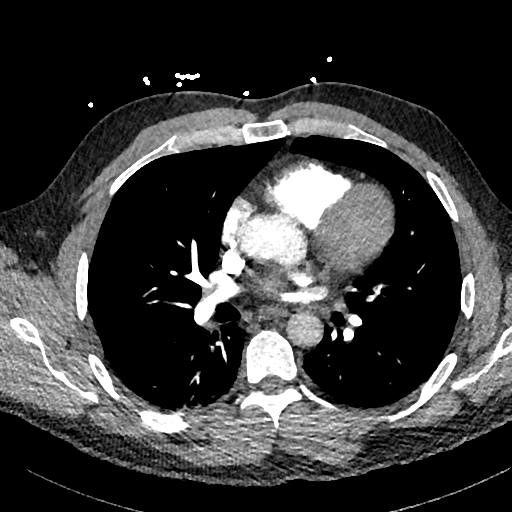
[im 295/452  lung]
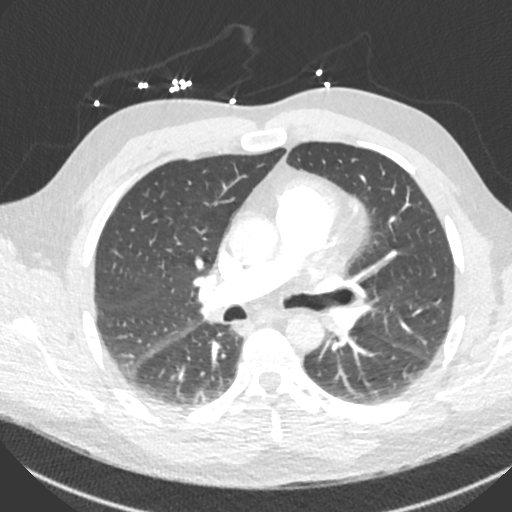
[im 314/452  soft-tissue]
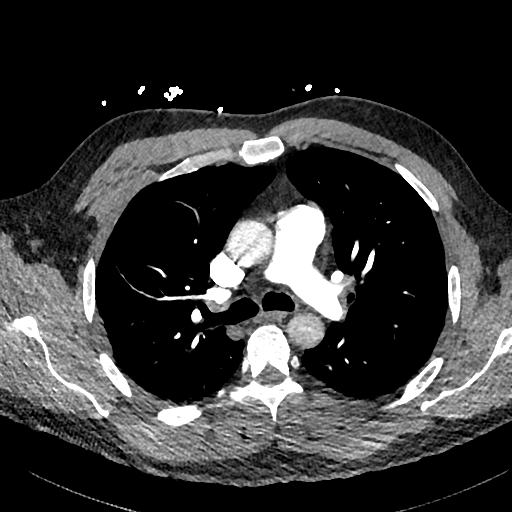
[im 353/452  lung]
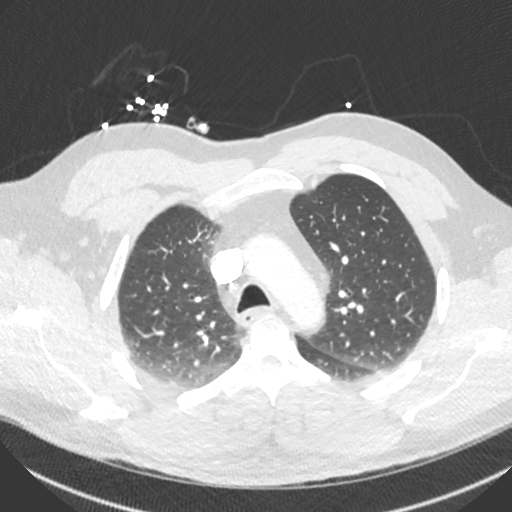
[im 373/452  soft-tissue]
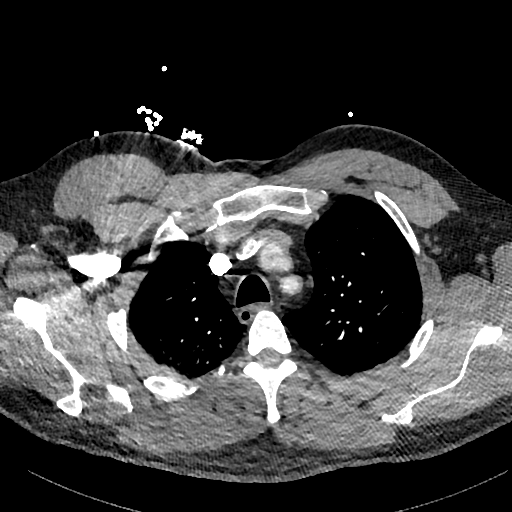
[im 393/452  lung]
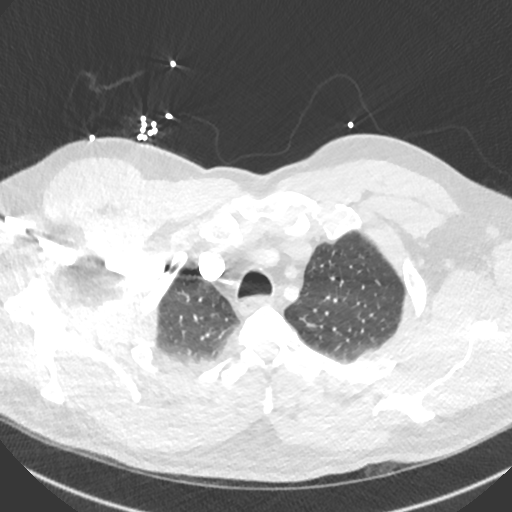
[im 432/452  soft-tissue]
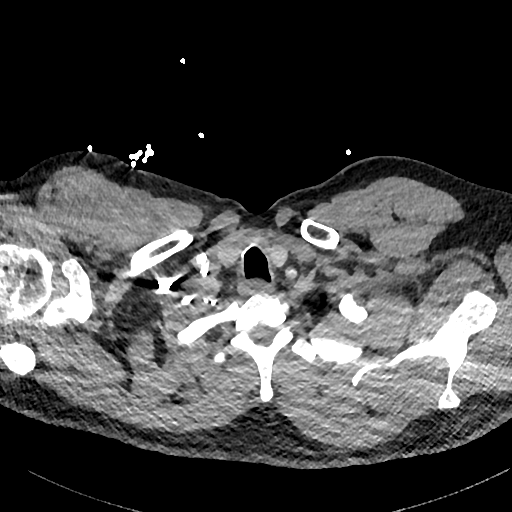

[Series 8: cor · coronal · 0.62mm/px · 3 of 131 slices shown]
[im 33/131  soft-tissue]
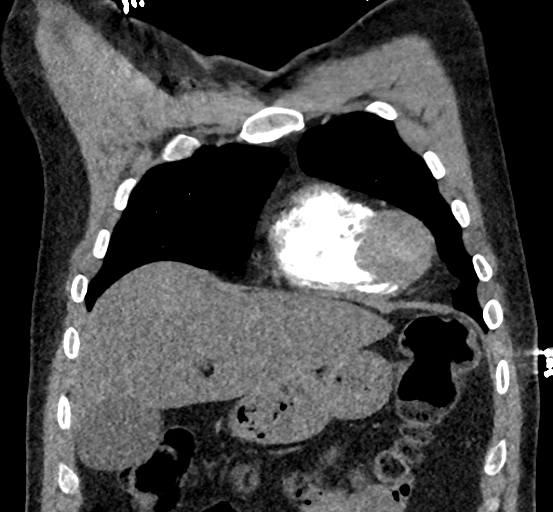
[im 66/131  soft-tissue]
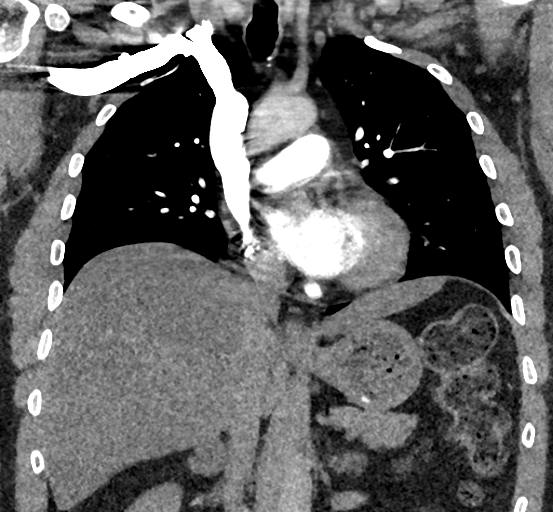
[im 98/131  soft-tissue]
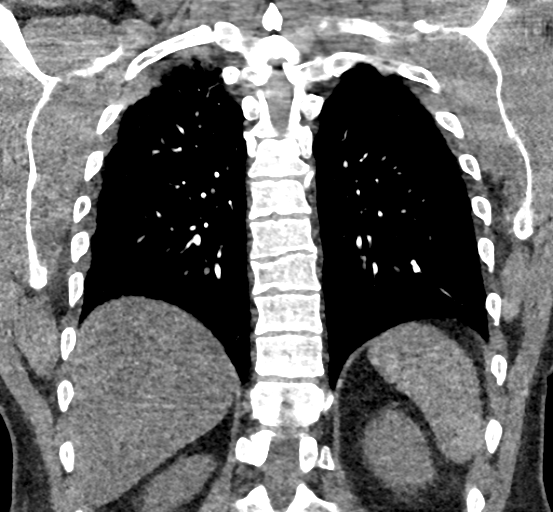

[19 of 46 positions shown; findings below may reference images not displayed]

FINDINGS: Cardiovascular: No filling defects within the pulmonary arteries
arteries to suggest acute pulmonary embolism. No significant
vascular findings. Normal heart size. No pericardial effusion.

Mediastinum/Nodes: No axillary or supraclavicular adenopathy. No
mediastinal hilar adenopathy. No pericardial fluid. Esophagus
normal.

Lungs/Pleura: Lung bases of mild atelectasis. No pulmonary
infarction. No infiltrate or pulmonary edema. No pneumothorax.

Upper Abdomen: Limited view of the liver, kidneys, pancreas are
unremarkable. Normal adrenal glands.

Musculoskeletal: No acute osseous abnormality.

Review of the MIP images confirms the above findings.
IMPRESSION: 1. No acute pulmonary embolism.
2. Mild bibasilar atelectasis

## 2018-10-10 DIAGNOSIS — M069 Rheumatoid arthritis, unspecified: Secondary | ICD-10-CM | POA: Diagnosis not present

## 2018-10-10 DIAGNOSIS — M47817 Spondylosis without myelopathy or radiculopathy, lumbosacral region: Secondary | ICD-10-CM | POA: Diagnosis not present

## 2018-10-10 DIAGNOSIS — G894 Chronic pain syndrome: Secondary | ICD-10-CM | POA: Diagnosis not present

## 2018-10-10 DIAGNOSIS — M47812 Spondylosis without myelopathy or radiculopathy, cervical region: Secondary | ICD-10-CM | POA: Diagnosis not present

## 2018-11-10 DIAGNOSIS — M47817 Spondylosis without myelopathy or radiculopathy, lumbosacral region: Secondary | ICD-10-CM | POA: Diagnosis not present

## 2018-11-10 DIAGNOSIS — G894 Chronic pain syndrome: Secondary | ICD-10-CM | POA: Diagnosis not present

## 2018-11-10 DIAGNOSIS — M069 Rheumatoid arthritis, unspecified: Secondary | ICD-10-CM | POA: Diagnosis not present

## 2018-11-10 DIAGNOSIS — M47812 Spondylosis without myelopathy or radiculopathy, cervical region: Secondary | ICD-10-CM | POA: Diagnosis not present

## 2018-12-07 DIAGNOSIS — M47817 Spondylosis without myelopathy or radiculopathy, lumbosacral region: Secondary | ICD-10-CM | POA: Diagnosis not present

## 2018-12-07 DIAGNOSIS — M47812 Spondylosis without myelopathy or radiculopathy, cervical region: Secondary | ICD-10-CM | POA: Diagnosis not present

## 2018-12-07 DIAGNOSIS — M069 Rheumatoid arthritis, unspecified: Secondary | ICD-10-CM | POA: Diagnosis not present

## 2018-12-07 DIAGNOSIS — G894 Chronic pain syndrome: Secondary | ICD-10-CM | POA: Diagnosis not present

## 2018-12-08 ENCOUNTER — Other Ambulatory Visit: Payer: Self-pay

## 2018-12-08 ENCOUNTER — Ambulatory Visit (INDEPENDENT_AMBULATORY_CARE_PROVIDER_SITE_OTHER): Payer: Medicare Other | Admitting: Psychiatry

## 2018-12-08 ENCOUNTER — Encounter (HOSPITAL_COMMUNITY): Payer: Self-pay | Admitting: Psychiatry

## 2018-12-08 DIAGNOSIS — F411 Generalized anxiety disorder: Secondary | ICD-10-CM

## 2018-12-08 DIAGNOSIS — F331 Major depressive disorder, recurrent, moderate: Secondary | ICD-10-CM

## 2018-12-08 MED ORDER — ESCITALOPRAM OXALATE 10 MG PO TABS: 10.0000 mg | ORAL_TABLET | Freq: Every day | ORAL | 3 refills | Status: DC

## 2018-12-08 NOTE — Progress Notes (Signed)
Patient ID: [name], male   DOB: [birth year], 48 y.o.   MRN: [id number] [facility] Follow-up Outpatient Visit  [name] [id number] 48 y.o.  12/08/2018  Chief Complaint: follow up for depression and anxiety.   History of Present Illness:    I connected with Jamey Ripa on 12/08/18 at 10:00 AM EST by telephone and verified that I am speaking with the correct person using two identifiers.   I discussed the limitations, risks, security and privacy concerns of performing an evaluation and management service by telephone and the availability of in person appointments. I also discussed with the patient that there may be a patient responsible charge related to this service. The patient expressed understanding and agreed to proceed.   Doing fair on lexapro   Modifying factors:relationship,  disability income . Severity:not worse Depression; fair No psychosis      Past Medical History:  Diagnosis Date  . Anxiety   . Arthritis   . Asthma    history [id number]  . Depression   . Essential hypertension 12/11/2011  . Gout   . Headache   . History of hiatal hernia   . History of lithotripsy   . Hypertension   . Kidney stones   . Lumbar degenerative disc disease 12/11/2011  . Nephrolithiasis 12/11/2011  . Pericarditis   . Renal tubular acidosis   . Rheumatoid arthritis(714.0)    Family History  Problem Relation Age of Onset  . Hypertension Father   . Alcohol abuse Father   . Heart attack Father 11  . Diabetes Mellitus II Father   . Coronary artery disease Father   . Diabetes Mellitus II Mother        Micah Flesher into a diabetic coma, per pt.  . Cancer - Colon Mother   . Coronary artery disease Mother   . Heart attack Mother 60  . Ovarian cancer Mother   . Uterine cancer Mother   . Heart attack Brother   . Coronary artery disease Brother   . Heart murmur Sister   . Dementia Neg Hx   . Bipolar disorder Neg Hx   . Depression Neg Hx   . Drug  abuse Neg Hx   . OCD Neg Hx     Outpatient Encounter Medications as of 12/08/2018  Medication Sig  . albuterol (PROVENTIL HFA;VENTOLIN HFA) 108 (90 Base) MCG/ACT inhaler Inhale 1-2 puffs into the lungs every 4 (four) hours as needed for wheezing or shortness of breath.  . AMBULATORY NON FORMULARY MEDICATION Medication Name: Gilmer Mor, use as needed.  Dx: Rheumatoid Arthritis  . amLODipine (NORVASC) 10 MG tablet One tablet by mouth every day for blood pressure control.  Marland Kitchen atorvastatin (LIPITOR) 40 MG tablet Take 1 tablet (40 mg total) by mouth daily.  . cyclobenzaprine (FLEXERIL) 10 MG tablet Take 1 tablet (10 mg total) by mouth 3 (three) times daily as needed for muscle spasms.  Marland Kitchen escitalopram (LEXAPRO) 10 MG tablet Take 1 tablet (10 mg total) by mouth daily.  . fluticasone (FLONASE) 50 MCG/ACT nasal spray Place 1-2 sprays into both nostrils daily.  . fluticasone furoate-vilanterol (BREO ELLIPTA) 200-25 MCG/INH AEPB Inhale 1 puff into the lungs daily.  Marland Kitchen loratadine (CLARITIN) 10 MG tablet Take 1 tablet (10 mg total) by mouth daily.  . methotrexate (RHEUMATREX) 2.5 MG tablet Take 20 mg by mouth every Tuesday. Caution:Chemotherapy. Protect from light.   Marland Kitchen omeprazole (PRILOSEC) 40 MG capsule Take 1 capsule (40 mg total) by mouth daily.  Marland Kitchen  pregabalin (LYRICA) 50 MG capsule Take 1 capsule (50 mg total) by mouth daily.  . promethazine (PHENERGAN) 25 MG tablet Take 1 tablet (25 mg total) by mouth every 6 (six) hours as needed for nausea or vomiting.  . SUMAtriptan (IMITREX) 100 MG tablet Take 0.5 tablets (50 mg total) by mouth every 2 (two) hours as needed for migraine. May repeat in 2 hours if headache persists or recurs.  . varenicline (CHANTIX CONTINUING MONTH PAK) 1 MG tablet Take 1 tablet (1 mg total) by mouth 2 (two) times daily.  . varenicline (CHANTIX STARTING MONTH PAK) 0.5 MG X 11 & 1 MG X 42 tablet Take one 0.5 mg tablet by mouth once daily for 3 days, then increase to one 0.5 mg tablet twice  daily for 4 days, then increase to one 1 mg tablet twice daily.  . [DISCONTINUED] escitalopram (LEXAPRO) 10 MG tablet Take 1 tablet (10 mg total) by mouth daily.  . [DISCONTINUED] FLUoxetine (PROZAC) 40 MG capsule Take 1 capsule (40 mg total) by mouth 2 (two) times daily.  . [DISCONTINUED] traZODone (DESYREL) 100 MG tablet Take 1 tablet (100 mg total) by mouth at bedtime as needed for sleep.   No facility-administered encounter medications on file as of 12/08/2018.     No results found for this or any previous visit (from the past 2160 hour(s)).  There were no vitals taken for this visit.   Review of Systems  Cardiovascular: Negative for chest pain.  Skin: Negative for itching.  Psychiatric/Behavioral: Negative for depression and suicidal ideas.    Mental Status Examination  Appearance: Alert: Yes Attention: fair  Cooperative: Yes Eye Contact: Speech: coherent Psychomotor Activity: Normal Memory/Concentration: adequate Oriented: person, place, time/date and situation Mood: fair Affect: congruent Thought Processes and Associations: Coherent Fund of Knowledge: Fair Thought Content: Suicidal ideation and Homicidal ideation were denied. Denies hallucinations Insight: Fair Judgement: Fair  Diagnosis: Major depressive disorder recurrent moderate to severe. Generalized anxiety disorder. Insomnia. 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 Refills sent  Insomnia:baseline. Not worse. Not on trazadone now Anxiety : manageable on lexapro Pain effects mood , not worse I discussed the assessment and treatment plan with the patient. The patient was provided an opportunity to ask questions and all were answered. The patient agreed with the plan and demonstrated an understanding of the instructions.   The patient was advised to call back or seek an in-person evaluation if the symptoms worsen or if the  condition fails to improve as anticipated.  I provided 15 minutes of non-face-to-face time during this encounter. Reviewed meds,  FU 7m.    Merian Capron, MD

## 2018-12-28 ENCOUNTER — Ambulatory Visit (INDEPENDENT_AMBULATORY_CARE_PROVIDER_SITE_OTHER): Payer: Medicare Other | Admitting: Osteopathic Medicine

## 2018-12-28 DIAGNOSIS — Z5329 Procedure and treatment not carried out because of patient's decision for other reasons: Secondary | ICD-10-CM

## 2018-12-28 NOTE — Progress Notes (Signed)
Called pt at 108 pm, 122 pm and 131pm, no answer. Unable to leave a vm msg, mailbox is full.

## 2019-01-04 DIAGNOSIS — G894 Chronic pain syndrome: Secondary | ICD-10-CM | POA: Diagnosis not present

## 2019-01-04 DIAGNOSIS — M47817 Spondylosis without myelopathy or radiculopathy, lumbosacral region: Secondary | ICD-10-CM | POA: Diagnosis not present

## 2019-01-04 DIAGNOSIS — M069 Rheumatoid arthritis, unspecified: Secondary | ICD-10-CM | POA: Diagnosis not present

## 2019-01-04 DIAGNOSIS — M47812 Spondylosis without myelopathy or radiculopathy, cervical region: Secondary | ICD-10-CM | POA: Diagnosis not present

## 2019-02-08 DIAGNOSIS — G894 Chronic pain syndrome: Secondary | ICD-10-CM | POA: Diagnosis not present

## 2019-02-08 DIAGNOSIS — M47817 Spondylosis without myelopathy or radiculopathy, lumbosacral region: Secondary | ICD-10-CM | POA: Diagnosis not present

## 2019-02-08 DIAGNOSIS — M47812 Spondylosis without myelopathy or radiculopathy, cervical region: Secondary | ICD-10-CM | POA: Diagnosis not present

## 2019-02-08 DIAGNOSIS — M069 Rheumatoid arthritis, unspecified: Secondary | ICD-10-CM | POA: Diagnosis not present

## 2019-03-09 ENCOUNTER — Ambulatory Visit (INDEPENDENT_AMBULATORY_CARE_PROVIDER_SITE_OTHER): Payer: Medicare Other | Admitting: Psychiatry

## 2019-03-09 ENCOUNTER — Encounter (HOSPITAL_COMMUNITY): Payer: Self-pay | Admitting: Psychiatry

## 2019-03-09 ENCOUNTER — Other Ambulatory Visit: Payer: Self-pay

## 2019-03-09 DIAGNOSIS — M47817 Spondylosis without myelopathy or radiculopathy, lumbosacral region: Secondary | ICD-10-CM | POA: Diagnosis not present

## 2019-03-09 DIAGNOSIS — F5102 Adjustment insomnia: Secondary | ICD-10-CM

## 2019-03-09 DIAGNOSIS — F331 Major depressive disorder, recurrent, moderate: Secondary | ICD-10-CM | POA: Diagnosis not present

## 2019-03-09 DIAGNOSIS — F411 Generalized anxiety disorder: Secondary | ICD-10-CM

## 2019-03-09 DIAGNOSIS — M47812 Spondylosis without myelopathy or radiculopathy, cervical region: Secondary | ICD-10-CM | POA: Diagnosis not present

## 2019-03-09 DIAGNOSIS — M069 Rheumatoid arthritis, unspecified: Secondary | ICD-10-CM | POA: Diagnosis not present

## 2019-03-09 DIAGNOSIS — G894 Chronic pain syndrome: Secondary | ICD-10-CM | POA: Diagnosis not present

## 2019-03-09 MED ORDER — ESCITALOPRAM OXALATE 10 MG PO TABS: 10.0000 mg | ORAL_TABLET | Freq: Every day | ORAL | 2 refills | Status: DC

## 2019-03-09 NOTE — Progress Notes (Signed)
Patient ID: [name], male   DOB: 11-Jan-1971, 49 y.o.   MRN: [id number] [place] Follow-up Outpatient Visit  LAWERENCE DERY [id number] 49 y.o.  03/09/2019  Chief Complaint: follow up for depression and anxiety.   History of Present Illness:      I connected with Fenton Malling on 03/09/19 at  3:30 PM EST by telephone and verified that I am speaking with the correct person using two identifiers.    I discussed the limitations, risks, security and privacy concerns of performing an evaluation and management service by telephone and the availability of in person appointments. I also discussed with the patient that there may be a patient responsible charge related to this service. The patient expressed understanding and agreed to proceed.  Doing fair on lexapro No side effects mood is balanced Staying home due to pandemic  Modifying factors:relationship,  disability income . Severity:not worse Depression; fair No psychosis      Past Medical History:  Diagnosis Date  . Anxiety   . Arthritis   . Asthma    history [id number]  . Depression   . Essential hypertension 12/11/2011  . Gout   . Headache   . History of hiatal hernia   . History of lithotripsy   . Hypertension   . Kidney stones   . Lumbar degenerative disc disease 12/11/2011  . Nephrolithiasis 12/11/2011  . Pericarditis   . Renal tubular acidosis   . Rheumatoid arthritis(714.0)    Family History  Problem Relation Age of Onset  . Hypertension Father   . Alcohol abuse Father   . Heart attack Father 63  . Diabetes Mellitus II Father   . Coronary artery disease Father   . Diabetes Mellitus II Mother        Martin Majestic into a diabetic coma, per pt.  . Cancer - Colon Mother   . Coronary artery disease Mother   . Heart attack Mother 74  . Ovarian cancer Mother   . Uterine cancer Mother   . Heart attack Brother   . Coronary artery disease Brother   . Heart murmur Sister   . Dementia Neg Hx    . Bipolar disorder Neg Hx   . Depression Neg Hx   . Drug abuse Neg Hx   . OCD Neg Hx     Outpatient Encounter Medications as of 03/09/2019  Medication Sig  . albuterol (PROVENTIL HFA;VENTOLIN HFA) 108 (90 Base) MCG/ACT inhaler Inhale 1-2 puffs into the lungs every 4 (four) hours as needed for wheezing or shortness of breath.  . AMBULATORY NON FORMULARY MEDICATION Medication Name: Kasandra Knudsen, use as needed.  Dx: Rheumatoid Arthritis  . amLODipine (NORVASC) 10 MG tablet One tablet by mouth every day for blood pressure control.  Marland Kitchen atorvastatin (LIPITOR) 40 MG tablet Take 1 tablet (40 mg total) by mouth daily.  . cyclobenzaprine (FLEXERIL) 10 MG tablet Take 1 tablet (10 mg total) by mouth 3 (three) times daily as needed for muscle spasms.  Marland Kitchen escitalopram (LEXAPRO) 10 MG tablet Take 1 tablet (10 mg total) by mouth daily.  . fluticasone (FLONASE) 50 MCG/ACT nasal spray Place 1-2 sprays into both nostrils daily.  . fluticasone furoate-vilanterol (BREO ELLIPTA) 200-25 MCG/INH AEPB Inhale 1 puff into the lungs daily.  Marland Kitchen loratadine (CLARITIN) 10 MG tablet Take 1 tablet (10 mg total) by mouth daily.  . methotrexate (RHEUMATREX) 2.5 MG tablet Take 20 mg by mouth every Tuesday. Caution:Chemotherapy. Protect from light.   Marland Kitchen omeprazole (PRILOSEC) 40  MG capsule Take 1 capsule (40 mg total) by mouth daily.  . pregabalin (LYRICA) 50 MG capsule Take 1 capsule (50 mg total) by mouth daily.  . promethazine (PHENERGAN) 25 MG tablet Take 1 tablet (25 mg total) by mouth every 6 (six) hours as needed for nausea or vomiting.  . SUMAtriptan (IMITREX) 100 MG tablet Take 0.5 tablets (50 mg total) by mouth every 2 (two) hours as needed for migraine. May repeat in 2 hours if headache persists or recurs.  . varenicline (CHANTIX CONTINUING MONTH PAK) 1 MG tablet Take 1 tablet (1 mg total) by mouth 2 (two) times daily.  . varenicline (CHANTIX STARTING MONTH PAK) 0.5 MG X 11 & 1 MG X 42 tablet Take one 0.5 mg tablet by mouth once  daily for 3 days, then increase to one 0.5 mg tablet twice daily for 4 days, then increase to one 1 mg tablet twice daily.  . [DISCONTINUED] escitalopram (LEXAPRO) 10 MG tablet Take 1 tablet (10 mg total) by mouth daily.  . [DISCONTINUED] FLUoxetine (PROZAC) 40 MG capsule Take 1 capsule (40 mg total) by mouth 2 (two) times daily.  . [DISCONTINUED] traZODone (DESYREL) 100 MG tablet Take 1 tablet (100 mg total) by mouth at bedtime as needed for sleep.   No facility-administered encounter medications on file as of 03/09/2019.    No results found for this or any previous visit (from the past 2160 hour(s)).  There were no vitals taken for this visit.   Review of Systems  Cardiovascular: Negative for chest pain.  Skin: Negative for itching.  Psychiatric/Behavioral: Negative for depression and suicidal ideas.    Mental Status Examination  Appearance: Alert: Yes Attention: fair  Cooperative: Yes Eye Contact: Speech: coherent Psychomotor Activity: Normal Memory/Concentration: adequate Oriented: person, place, time/date and situation Mood: fair Affect: congruent Thought Processes and Associations: Coherent Fund of Knowledge: Fair Thought Content: Suicidal ideation and Homicidal ideation were denied. Denies hallucinations Insight: Fair Judgement: Fair  Diagnosis: Major depressive disorder recurrent moderate to severe. Generalized anxiety disorder. Insomnia. 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 Not worse. Not on trazadone now Anxiety : manageable on lexapro Pain effects mood , not worse I discussed the assessment and treatment plan with the patient. The patient was provided an opportunity to ask questions and all were answered. The patient agreed with the plan and demonstrated an understanding of the instructions.   The patient was advised to call back or seek an in-person  evaluation if the symptoms worsen or if the condition fails to improve as anticipated.  I provided 15 minutes of non-face-to-face time during this encounter. Reviewed meds,  FU 3-16m or earlier if needed    Thresa Ross, MD

## 2019-04-10 DIAGNOSIS — M47817 Spondylosis without myelopathy or radiculopathy, lumbosacral region: Secondary | ICD-10-CM | POA: Diagnosis not present

## 2019-04-10 DIAGNOSIS — G894 Chronic pain syndrome: Secondary | ICD-10-CM | POA: Diagnosis not present

## 2019-04-10 DIAGNOSIS — M47812 Spondylosis without myelopathy or radiculopathy, cervical region: Secondary | ICD-10-CM | POA: Diagnosis not present

## 2019-04-10 DIAGNOSIS — M069 Rheumatoid arthritis, unspecified: Secondary | ICD-10-CM | POA: Diagnosis not present

## 2019-05-02 ENCOUNTER — Other Ambulatory Visit: Payer: Self-pay | Admitting: Osteopathic Medicine

## 2019-05-02 DIAGNOSIS — R0602 Shortness of breath: Secondary | ICD-10-CM

## 2019-05-11 DIAGNOSIS — M47817 Spondylosis without myelopathy or radiculopathy, lumbosacral region: Secondary | ICD-10-CM | POA: Diagnosis not present

## 2019-05-11 DIAGNOSIS — M47812 Spondylosis without myelopathy or radiculopathy, cervical region: Secondary | ICD-10-CM | POA: Diagnosis not present

## 2019-05-11 DIAGNOSIS — G894 Chronic pain syndrome: Secondary | ICD-10-CM | POA: Diagnosis not present

## 2019-05-11 DIAGNOSIS — M069 Rheumatoid arthritis, unspecified: Secondary | ICD-10-CM | POA: Diagnosis not present

## 2019-06-08 DIAGNOSIS — G894 Chronic pain syndrome: Secondary | ICD-10-CM | POA: Diagnosis not present

## 2019-06-08 DIAGNOSIS — M47812 Spondylosis without myelopathy or radiculopathy, cervical region: Secondary | ICD-10-CM | POA: Diagnosis not present

## 2019-06-08 DIAGNOSIS — M069 Rheumatoid arthritis, unspecified: Secondary | ICD-10-CM | POA: Diagnosis not present

## 2019-06-08 DIAGNOSIS — M47817 Spondylosis without myelopathy or radiculopathy, lumbosacral region: Secondary | ICD-10-CM | POA: Diagnosis not present

## 2019-07-07 ENCOUNTER — Encounter (HOSPITAL_COMMUNITY): Payer: Self-pay | Admitting: Psychiatry

## 2019-07-07 ENCOUNTER — Telehealth (INDEPENDENT_AMBULATORY_CARE_PROVIDER_SITE_OTHER): Payer: Medicare Other | Admitting: Psychiatry

## 2019-07-07 ENCOUNTER — Other Ambulatory Visit: Payer: Self-pay

## 2019-07-07 DIAGNOSIS — F411 Generalized anxiety disorder: Secondary | ICD-10-CM

## 2019-07-07 DIAGNOSIS — F5102 Adjustment insomnia: Secondary | ICD-10-CM

## 2019-07-07 DIAGNOSIS — F331 Major depressive disorder, recurrent, moderate: Secondary | ICD-10-CM | POA: Diagnosis not present

## 2019-07-07 MED ORDER — ESCITALOPRAM OXALATE 10 MG PO TABS: 10.0000 mg | ORAL_TABLET | Freq: Every day | ORAL | 2 refills | Status: DC

## 2019-07-07 NOTE — Progress Notes (Addendum)
Patient ID: SHELIA MAGALLON, male   DOB: [birth year], 49 y.o.   MRN: [id number] [facility] Follow-up Outpatient Visit  KAZ AULD [id number] 49 y.o.  07/07/2019  Chief Complaint: follow up for depression and anxiety.   History of Present Illness:      I connected with Jamey Ripa on 07/07/19 at  11;40 AM EST by telephone and verified that I am speaking with the correct person using two identifiers.    I discussed the limitations, risks, security and privacy concerns of performing an evaluation and management service by telephone and the availability of in person appointments. I also discussed with the patient that there may be a patient responsible charge related to this service. The patient expressed understanding and agreed to proceed.  Patient location  Home Provider location : home office  Remains balance in mood and depression on lexapro  Modifying factors:relationship,  disability income . Severity:baseline, not worse  Depression; fair No psychosis      Past Medical History:  Diagnosis Date  . Anxiety   . Arthritis   . Asthma    history [id number]  . Depression   . Essential hypertension 12/11/2011  . Gout   . Headache   . History of hiatal hernia   . History of lithotripsy   . Hypertension   . Kidney stones   . Lumbar degenerative disc disease 12/11/2011  . Nephrolithiasis 12/11/2011  . Pericarditis   . Renal tubular acidosis   . Rheumatoid arthritis(714.0)    Family History  Problem Relation Age of Onset  . Hypertension Father   . Alcohol abuse Father   . Heart attack Father 50  . Diabetes Mellitus II Father   . Coronary artery disease Father   . Diabetes Mellitus II Mother        Micah Flesher into a diabetic coma, per pt.  . Cancer - Colon Mother   . Coronary artery disease Mother   . Heart attack Mother 87  . Ovarian cancer Mother   . Uterine cancer Mother   . Heart attack Brother   . Coronary artery disease Brother   . Heart  murmur Sister   . Dementia Neg Hx   . Bipolar disorder Neg Hx   . Depression Neg Hx   . Drug abuse Neg Hx   . OCD Neg Hx     Outpatient Encounter Medications as of 07/07/2019  Medication Sig  . AMBULATORY NON FORMULARY MEDICATION Medication Name: Gilmer Mor, use as needed.  Dx: Rheumatoid Arthritis  . amLODipine (NORVASC) 10 MG tablet One tablet by mouth every day for blood pressure control.  Marland Kitchen atorvastatin (LIPITOR) 40 MG tablet Take 1 tablet (40 mg total) by mouth daily.  . cyclobenzaprine (FLEXERIL) 10 MG tablet Take 1 tablet (10 mg total) by mouth 3 (three) times daily as needed for muscle spasms.  Marland Kitchen escitalopram (LEXAPRO) 10 MG tablet Take 1 tablet (10 mg total) by mouth daily.  . fluticasone (FLONASE) 50 MCG/ACT nasal spray Place 1-2 sprays into both nostrils daily.  . fluticasone furoate-vilanterol (BREO ELLIPTA) 200-25 MCG/INH AEPB Inhale 1 puff into the lungs daily.  Marland Kitchen loratadine (CLARITIN) 10 MG tablet Take 1 tablet (10 mg total) by mouth daily.  . methotrexate (RHEUMATREX) 2.5 MG tablet Take 20 mg by mouth every Tuesday. Caution:Chemotherapy. Protect from light.   Marland Kitchen omeprazole (PRILOSEC) 40 MG capsule Take 1 capsule by mouth once daily  . pregabalin (LYRICA) 50 MG capsule Take 1 capsule (50 mg total) by mouth  daily.  . promethazine (PHENERGAN) 25 MG tablet Take 1 tablet (25 mg total) by mouth every 6 (six) hours as needed for nausea or vomiting.  . SUMAtriptan (IMITREX) 100 MG tablet Take 0.5 tablets (50 mg total) by mouth every 2 (two) hours as needed for migraine. May repeat in 2 hours if headache persists or recurs.  . varenicline (CHANTIX CONTINUING MONTH PAK) 1 MG tablet Take 1 tablet (1 mg total) by mouth 2 (two) times daily.  . varenicline (CHANTIX STARTING MONTH PAK) 0.5 MG X 11 & 1 MG X 42 tablet Take one 0.5 mg tablet by mouth once daily for 3 days, then increase to one 0.5 mg tablet twice daily for 4 days, then increase to one 1 mg tablet twice daily.  . VENTOLIN HFA 108 (90  Base) MCG/ACT inhaler INHALE 1 TO 2 PUFFS BY MOUTH EVERY 4 HOURS AS NEEDED FOR WHEEZING AND FOR SHORTNESS OF BREATH  . [DISCONTINUED] escitalopram (LEXAPRO) 10 MG tablet Take 1 tablet (10 mg total) by mouth daily.  . [DISCONTINUED] FLUoxetine (PROZAC) 40 MG capsule Take 1 capsule (40 mg total) by mouth 2 (two) times daily.  . [DISCONTINUED] traZODone (DESYREL) 100 MG tablet Take 1 tablet (100 mg total) by mouth at bedtime as needed for sleep.   No facility-administered encounter medications on file as of 07/07/2019.    No results found for this or any previous visit (from the past 2160 hour(s)).  There were no vitals taken for this visit.   Review of Systems  Cardiovascular: Negative for chest pain.  Skin: Negative for itching.  Psychiatric/Behavioral: Negative for depression and suicidal ideas.    Mental Status Examination  Appearance: Alert: Yes Attention: fair  Cooperative: Yes Eye Contact: Speech: coherent Psychomotor Activity: Normal Memory/Concentration: adequate Oriented: person, place, time/date and situation Mood:fair Affect: congruent Thought Processes and Associations: Coherent Fund of Knowledge: Fair Thought Content: Suicidal ideation and Homicidal ideation were denied. Denies hallucinations Insight: Fair Judgement: Fair  Diagnosis: Major depressive disorder recurrent moderate to severe. Generalized anxiety disorder. Insomnia. 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 Not worse. Not on trazadone now Anxiety : manageable on lexapro Pain effects mood , not worse I discussed the assessment and treatment plan with the patient. The patient was provided an opportunity to ask questions and all were answered. The patient agreed with the plan and demonstrated an understanding of the instructions.   The patient was advised to call back or seek an in-person  evaluation if the symptoms worsen or if the condition fails to improve as anticipated.  I provided 15 minutes of non-face-to-face time during this encounter. Reviewed meds,  FU 3-53m or earlier if needed    Merian Capron, MD

## 2019-07-10 DIAGNOSIS — G894 Chronic pain syndrome: Secondary | ICD-10-CM | POA: Diagnosis not present

## 2019-07-10 DIAGNOSIS — M47817 Spondylosis without myelopathy or radiculopathy, lumbosacral region: Secondary | ICD-10-CM | POA: Diagnosis not present

## 2019-07-10 DIAGNOSIS — M069 Rheumatoid arthritis, unspecified: Secondary | ICD-10-CM | POA: Diagnosis not present

## 2019-07-10 DIAGNOSIS — M47812 Spondylosis without myelopathy or radiculopathy, cervical region: Secondary | ICD-10-CM | POA: Diagnosis not present

## 2019-07-12 ENCOUNTER — Ambulatory Visit (INDEPENDENT_AMBULATORY_CARE_PROVIDER_SITE_OTHER): Payer: Medicare Other | Admitting: Osteopathic Medicine

## 2019-07-12 ENCOUNTER — Encounter: Payer: Self-pay | Admitting: Osteopathic Medicine

## 2019-07-12 ENCOUNTER — Other Ambulatory Visit: Payer: Self-pay

## 2019-07-12 DIAGNOSIS — G2581 Restless legs syndrome: Secondary | ICD-10-CM

## 2019-07-12 DIAGNOSIS — I10 Essential (primary) hypertension: Secondary | ICD-10-CM

## 2019-07-12 DIAGNOSIS — G894 Chronic pain syndrome: Secondary | ICD-10-CM

## 2019-07-12 DIAGNOSIS — R0602 Shortness of breath: Secondary | ICD-10-CM | POA: Diagnosis not present

## 2019-07-12 DIAGNOSIS — E785 Hyperlipidemia, unspecified: Secondary | ICD-10-CM

## 2019-07-12 DIAGNOSIS — G43809 Other migraine, not intractable, without status migrainosus: Secondary | ICD-10-CM | POA: Diagnosis not present

## 2019-07-12 DIAGNOSIS — R110 Nausea: Secondary | ICD-10-CM

## 2019-07-12 LAB — COMPLETE METABOLIC PANEL WITH GFR
AG Ratio: 1.4 (calc) (ref 1.0–2.5)
ALT: 25 U/L (ref 9–46)
AST: 20 U/L (ref 10–35)
Albumin: 4.4 g/dL (ref 3.6–5.1)
Alkaline phosphatase (APISO): 48 U/L (ref 35–144)
BUN: 8 mg/dL (ref 7–25)
CO2: 29 mmol/L (ref 20–32)
Calcium: 9.7 mg/dL (ref 8.6–10.3)
Chloride: 102 mmol/L (ref 98–110)
Creat: 0.82 mg/dL (ref 0.70–1.33)
GFR, Est African American: 120 mL/min/{1.73_m2} (ref >=60)
GFR, Est Non African American: 103 mL/min/{1.73_m2} (ref >=60)
Globulin: 3.1 g/dL (calc) (ref 1.9–3.7)
Glucose, Bld: 106 mg/dL (ref 65–139)
Potassium: 4.4 mmol/L (ref 3.5–5.3)
Sodium: 137 mmol/L (ref 135–146)
Total Bilirubin: 0.5 mg/dL (ref 0.2–1.2)
Total Protein: 7.5 g/dL (ref 6.1–8.1)

## 2019-07-12 LAB — LIPID PANEL
Cholesterol: 215 mg/dL — ABNORMAL HIGH (ref <200)
HDL: 30 mg/dL — ABNORMAL LOW (ref >=40)
LDL Cholesterol (Calc): 162 mg/dL (calc) — ABNORMAL HIGH
Non-HDL Cholesterol (Calc): 185 mg/dL (calc) — ABNORMAL HIGH (ref <130)
Total CHOL/HDL Ratio: 7.2 (calc) — ABNORMAL HIGH (ref <5.0)
Triglycerides: 115 mg/dL (ref <150)

## 2019-07-12 LAB — CBC
HCT: 46.7 % (ref 38.5–50.0)
Hemoglobin: 15.9 g/dL (ref 13.2–17.1)
MCH: 30.1 pg (ref 27.0–33.0)
MCHC: 34 g/dL (ref 32.0–36.0)
MCV: 88.3 fL (ref 80.0–100.0)
MPV: 9.7 fL (ref 7.5–12.5)
Platelets: 374 10*3/uL (ref 140–400)
RBC: 5.29 10*6/uL (ref 4.20–5.80)
RDW: 12.8 % (ref 11.0–15.0)
WBC: 8.8 10*3/uL (ref 3.8–10.8)

## 2019-07-12 MED ORDER — LORATADINE 10 MG PO TABS: 10.0000 mg | ORAL_TABLET | Freq: Every day | ORAL | 3 refills | Status: DC

## 2019-07-12 MED ORDER — ATORVASTATIN CALCIUM 40 MG PO TABS: 40.0000 mg | ORAL_TABLET | Freq: Every day | ORAL | 3 refills | Status: DC

## 2019-07-12 MED ORDER — OMEPRAZOLE 40 MG PO CPDR: 40.0000 mg | DELAYED_RELEASE_CAPSULE | Freq: Every day | ORAL | 3 refills | Status: DC

## 2019-07-12 MED ORDER — PROMETHAZINE HCL 25 MG PO TABS: 25.0000 mg | ORAL_TABLET | Freq: Four times a day (QID) | ORAL | 3 refills | Status: AC | PRN

## 2019-07-12 MED ORDER — BREO ELLIPTA 200-25 MCG/INH IN AEPB: 1.0000 | INHALATION_SPRAY | Freq: Every day | RESPIRATORY_TRACT | 11 refills | Status: DC

## 2019-07-12 MED ORDER — VENTOLIN HFA 108 (90 BASE) MCG/ACT IN AERS: 1.0000 | INHALATION_SPRAY | RESPIRATORY_TRACT | 11 refills | Status: DC | PRN

## 2019-07-12 MED ORDER — CYCLOBENZAPRINE HCL 10 MG PO TABS: 10.0000 mg | ORAL_TABLET | Freq: Three times a day (TID) | ORAL | 5 refills | Status: DC | PRN

## 2019-07-12 MED ORDER — AMLODIPINE BESYLATE 10 MG PO TABS: ORAL_TABLET | ORAL | 0 refills | Status: DC

## 2019-07-12 MED ORDER — PREGABALIN 50 MG PO CAPS: 50.0000 mg | ORAL_CAPSULE | Freq: Every day | ORAL | 3 refills | Status: DC

## 2019-07-12 MED ORDER — SUMATRIPTAN SUCCINATE 100 MG PO TABS: 50.0000 mg | ORAL_TABLET | ORAL | 3 refills | Status: DC | PRN

## 2019-07-12 NOTE — Progress Notes (Signed)
v

## 2019-07-12 NOTE — Progress Notes (Signed)
[name] is a 49 y.o. male who presents to  [facility] at [facility]  today, 07/12/19, seeking care for the following: . HTN - above goal today, no CP/SOB, no HA/VC . HLD - out of Rx . SOB - chronic, worse since out of inhalers . Pain - following w/ specialist      ASSESSMENT & PLAN with other pertinent history/findings:  Diagnoses of Essential hypertension, Hyperlipidemia, unspecified hyperlipidemia type, SOB (shortness of breath), Other migraine without status migrainosus, not intractable, Nausea, Restless leg, and Chronic pain syndrome were pertinent to this visit.    There are no Patient Instructions on file for this visit.   Orders Placed This Encounter  Procedures  . CBC  . COMPLETE METABOLIC PANEL WITH GFR  . Lipid panel    Meds ordered this encounter  Medications  . amLODipine (NORVASC) 10 MG tablet    Sig: One tablet by mouth every day for blood pressure control.    Dispense:  30 tablet    Refill:  0    FILLED #30 07/12/19 NEEDS BP FOLLOW UP PRIOR TO ADDITIONAL RX -DR A  . atorvastatin (LIPITOR) 40 MG tablet    Sig: Take 1 tablet (40 mg total) by mouth daily.    Dispense:  90 tablet    Refill:  3  . VENTOLIN HFA 108 (90 Base) MCG/ACT inhaler    Sig: Inhale 1-2 puffs into the lungs every 4 (four) hours as needed for wheezing or shortness of breath.    Dispense:  18 g    Refill:  11    NEEDS APPOINTMENT WITH PCP FOR FURTHER REFILLS.  . SUMAtriptan (IMITREX) 100 MG tablet    Sig: Take 0.5 tablets (50 mg total) by mouth every 2 (two) hours as needed for migraine. May repeat in 2 hours if headache persists or recurs.    Dispense:  10 tablet    Refill:  3  . promethazine (PHENERGAN) 25 MG tablet    Sig: Take 1 tablet (25 mg total) by mouth every 6 (six) hours as needed for nausea or vomiting.    Dispense:  60 tablet    Refill:  3  . pregabalin (LYRICA) 50 MG capsule    Sig: Take 1 capsule (50 mg total) by mouth  daily.    Dispense:  90 capsule    Refill:  3  . cyclobenzaprine (FLEXERIL) 10 MG tablet    Sig: Take 1 tablet (10 mg total) by mouth 3 (three) times daily as needed for muscle spasms.    Dispense:  90 tablet    Refill:  5  . fluticasone furoate-vilanterol (BREO ELLIPTA) 200-25 MCG/INH AEPB    Sig: Inhale 1 puff into the lungs daily.    Dispense:  60 each    Refill:  11  . loratadine (CLARITIN) 10 MG tablet    Sig: Take 1 tablet (10 mg total) by mouth daily.    Dispense:  90 tablet    Refill:  3  . omeprazole (PRILOSEC) 40 MG capsule    Sig: Take 1 capsule (40 mg total) by mouth daily.    Dispense:  90 capsule    Refill:  3    Constitutional:  . VS, see nurse notes . General Appearance: alert, well-developed, well-nourished, NAD Neck: . No masses, trachea midline . No thyroid enlargement/tenderness/mass appreciated Respiratory: . Normal respiratory effort . Breath sounds normal, no wheeze/rhonchi/rales Cardiovascular: . S1/S2 normal, no murmur/rub/gallop auscultated . No  lower extremity edema Psychiatric: . Normal judgment/insight . Normal mood and affect    Follow-up instructions: Return in about 1 week (around 07/19/2019) for NURSE VISIT BP CHECK .                                         BP (!) 180/106   Pulse 70   Temp (!) 97 F (36.1 C)   Wt 235 lb (106.6 kg)   SpO2 99%   BMI 36.81 kg/m   Current Meds  Medication Sig  . atorvastatin (LIPITOR) 40 MG tablet Take 1 tablet (40 mg total) by mouth daily.  . cyclobenzaprine (FLEXERIL) 10 MG tablet Take 1 tablet (10 mg total) by mouth 3 (three) times daily as needed for muscle spasms.  Marland Kitchen escitalopram (LEXAPRO) 10 MG tablet Take 1 tablet (10 mg total) by mouth daily.  . fluticasone furoate-vilanterol (BREO ELLIPTA) 200-25 MCG/INH AEPB Inhale 1 puff into the lungs daily.  Marland Kitchen loratadine (CLARITIN) 10 MG tablet Take 1 tablet (10 mg total) by mouth daily.  . methotrexate  (RHEUMATREX) 2.5 MG tablet Take 20 mg by mouth every Tuesday. Caution:Chemotherapy. Protect from light.   Marland Kitchen omeprazole (PRILOSEC) 40 MG capsule Take 1 capsule (40 mg total) by mouth daily.  . pregabalin (LYRICA) 50 MG capsule Take 1 capsule (50 mg total) by mouth daily.  . promethazine (PHENERGAN) 25 MG tablet Take 1 tablet (25 mg total) by mouth every 6 (six) hours as needed for nausea or vomiting.  . SUMAtriptan (IMITREX) 100 MG tablet Take 0.5 tablets (50 mg total) by mouth every 2 (two) hours as needed for migraine. May repeat in 2 hours if headache persists or recurs.  . VENTOLIN HFA 108 (90 Base) MCG/ACT inhaler Inhale 1-2 puffs into the lungs every 4 (four) hours as needed for wheezing or shortness of breath.  . [DISCONTINUED] AMBULATORY NON FORMULARY MEDICATION Medication Name: Gilmer Mor, use as needed.  Dx: Rheumatoid Arthritis  . [DISCONTINUED] atorvastatin (LIPITOR) 40 MG tablet Take 1 tablet (40 mg total) by mouth daily.  . [DISCONTINUED] cyclobenzaprine (FLEXERIL) 10 MG tablet Take 1 tablet (10 mg total) by mouth 3 (three) times daily as needed for muscle spasms.  . [DISCONTINUED] fluticasone furoate-vilanterol (BREO ELLIPTA) 200-25 MCG/INH AEPB Inhale 1 puff into the lungs daily.  . [DISCONTINUED] loratadine (CLARITIN) 10 MG tablet Take 1 tablet (10 mg total) by mouth daily.  . [DISCONTINUED] omeprazole (PRILOSEC) 40 MG capsule Take 1 capsule by mouth once daily  . [DISCONTINUED] pregabalin (LYRICA) 50 MG capsule Take 1 capsule (50 mg total) by mouth daily.  . [DISCONTINUED] promethazine (PHENERGAN) 25 MG tablet Take 1 tablet (25 mg total) by mouth every 6 (six) hours as needed for nausea or vomiting.  . [DISCONTINUED] SUMAtriptan (IMITREX) 100 MG tablet Take 0.5 tablets (50 mg total) by mouth every 2 (two) hours as needed for migraine. May repeat in 2 hours if headache persists or recurs.  . [DISCONTINUED] VENTOLIN HFA 108 (90 Base) MCG/ACT inhaler INHALE 1 TO 2 PUFFS BY MOUTH EVERY 4 HOURS  AS NEEDED FOR WHEEZING AND FOR SHORTNESS OF BREATH    Results for orders placed or performed in visit on 07/12/19 (from the past 72 hour(s))  CBC     Status: None   Collection Time: 07/12/19 11:36 AM  Result Value Ref Range   WBC 8.8 3.8 - 10.8 Thousand/uL   RBC 5.29 4.20 -  5.80 Million/uL   Hemoglobin 15.9 13.2 - 17.1 g/dL   HCT 46.7 38 - 50 %   MCV 88.3 80.0 - 100.0 fL   MCH 30.1 27.0 - 33.0 pg   MCHC 34.0 32.0 - 36.0 g/dL   RDW 12.8 11.0 - 15.0 %   Platelets 374 140 - 400 Thousand/uL   MPV 9.7 7.5 - 12.5 fL    No results found.  Depression screen [facility] 2/9 02/03/2018 07/28/2017 03/06/2016  Decreased Interest 0 0 0  Down, Depressed, Hopeless 0 0 0  PHQ - 2 Score 0 0 0  Altered sleeping - 0 3  Tired, decreased energy - 0 3  Change in appetite - 0 0  Feeling bad or failure about yourself  - 0 0  Trouble concentrating - 0 0  Moving slowly or fidgety/restless - 0 0  Suicidal thoughts - 0 0  PHQ-9 Score - 0 6  Difficult doing work/chores - Not difficult at all -    GAD 7 : Generalized Anxiety Score 02/03/2018 07/28/2017 02/04/2016  Nervous, Anxious, on Edge 0 0 0  Control/stop worrying 0 0 1  Worry too much - different things 0 0 1  Trouble relaxing 0 0 0  Restless 0 0 1  Easily annoyed or irritable 0 0 1  Afraid - awful might happen 0 0 1  Total GAD 7 Score 0 0 5  Anxiety Difficulty Not difficult at all Not difficult at all -      All questions at time of visit were answered - patient instructed to contact office with any additional concerns or updates.  ER/RTC precautions were reviewed with the patient.  Please note: voice recognition software was used to produce this document, and typos may escape review. Please contact Dr. Sheppard Coil for any needed clarifications.

## 2019-07-20 ENCOUNTER — Ambulatory Visit (INDEPENDENT_AMBULATORY_CARE_PROVIDER_SITE_OTHER): Payer: Medicare Other | Admitting: Osteopathic Medicine

## 2019-07-20 VITALS — BP 134/89 | HR 78 | Wt 233.0 lb

## 2019-07-20 DIAGNOSIS — I10 Essential (primary) hypertension: Secondary | ICD-10-CM | POA: Diagnosis not present

## 2019-07-20 DIAGNOSIS — L723 Sebaceous cyst: Secondary | ICD-10-CM

## 2019-07-20 DIAGNOSIS — Z1211 Encounter for screening for malignant neoplasm of colon: Secondary | ICD-10-CM | POA: Diagnosis not present

## 2019-07-20 DIAGNOSIS — L089 Local infection of the skin and subcutaneous tissue, unspecified: Secondary | ICD-10-CM

## 2019-07-20 MED ORDER — AMLODIPINE BESYLATE 10 MG PO TABS: ORAL_TABLET | ORAL | 3 refills | Status: DC

## 2019-07-20 NOTE — Progress Notes (Signed)
   Subjective:    Patient ID: [name], male    DOB: January 13, 1971, 49 y.o.   MRN: [id number]  HPI Patient is here for blood pressure check. Denies trouble headaches, palpitations, or medication problems.    Review of Systems     Objective:   Physical Exam        Assessment & Plan:  Patient states he checks blood pressures at home and they are running average of 120s over low 100s-90s. Patient states that at last visit when his BP was elevated, he had been off medication for about 2 months. He has been taking Amlodipine as directed since last appt and no missed doses.   Initial BP check was 134/86. Patient was allowed to wait 13 minutes and recheck was 134/89.   HM: Needs colonoscopy, referral placed.  Wanting referral placed for dermatology, I have pended, he states "Dr Lyn Hollingshead knows the reason" (I pended cyst because that's on his problem list?). Please associate DX if that is not correct and sign referral. Has not had COVID vaccines, these have been deferred for 6 months in HM tab since he does intend on getting them.

## 2019-08-10 DIAGNOSIS — M069 Rheumatoid arthritis, unspecified: Secondary | ICD-10-CM | POA: Diagnosis not present

## 2019-08-10 DIAGNOSIS — M47817 Spondylosis without myelopathy or radiculopathy, lumbosacral region: Secondary | ICD-10-CM | POA: Diagnosis not present

## 2019-08-10 DIAGNOSIS — G894 Chronic pain syndrome: Secondary | ICD-10-CM | POA: Diagnosis not present

## 2019-08-10 DIAGNOSIS — M47812 Spondylosis without myelopathy or radiculopathy, cervical region: Secondary | ICD-10-CM | POA: Diagnosis not present

## 2019-08-29 DIAGNOSIS — M069 Rheumatoid arthritis, unspecified: Secondary | ICD-10-CM | POA: Diagnosis not present

## 2019-08-29 DIAGNOSIS — G894 Chronic pain syndrome: Secondary | ICD-10-CM | POA: Diagnosis not present

## 2019-08-29 DIAGNOSIS — M47812 Spondylosis without myelopathy or radiculopathy, cervical region: Secondary | ICD-10-CM | POA: Diagnosis not present

## 2019-08-29 DIAGNOSIS — M47817 Spondylosis without myelopathy or radiculopathy, lumbosacral region: Secondary | ICD-10-CM | POA: Diagnosis not present

## 2019-09-18 ENCOUNTER — Encounter: Payer: Self-pay | Admitting: Gastroenterology

## 2019-10-06 ENCOUNTER — Telehealth (INDEPENDENT_AMBULATORY_CARE_PROVIDER_SITE_OTHER): Payer: Medicare Other | Admitting: Psychiatry

## 2019-10-06 ENCOUNTER — Encounter (HOSPITAL_COMMUNITY): Payer: Self-pay | Admitting: Psychiatry

## 2019-10-06 DIAGNOSIS — F5102 Adjustment insomnia: Secondary | ICD-10-CM | POA: Diagnosis not present

## 2019-10-06 DIAGNOSIS — F331 Major depressive disorder, recurrent, moderate: Secondary | ICD-10-CM | POA: Diagnosis not present

## 2019-10-06 DIAGNOSIS — F411 Generalized anxiety disorder: Secondary | ICD-10-CM | POA: Diagnosis not present

## 2019-10-06 MED ORDER — ESCITALOPRAM OXALATE 10 MG PO TABS: 10.0000 mg | ORAL_TABLET | Freq: Every day | ORAL | 2 refills | Status: DC

## 2019-10-06 NOTE — Progress Notes (Signed)
Patient ID: [name], male   DOB: Nov 09, 1970, 49 y.o.   MRN: [id number] [facility] Follow-up Outpatient Visit  [name] [id number] 49 y.o.  10/06/2019  Chief Complaint: follow up for depression and anxiety.   History of Present Illness:    I connected with Jamey Ripa on 10/06/19 at 11:45 AM EDT by telephone and verified that I am speaking with the correct person using two identifiers.   I discussed the limitations, risks, security and privacy concerns of performing an evaluation and management service by telephone and the availability of in person appointments. I also discussed with the patient that there may be a patient responsible charge related to this service. The patient expressed understanding and agreed to proceed.  Patient location  Home Provider location : home office  Remains fair on lexapro, depression is balanced Sleeps fair without trazadone now  Has grand baby visits that help  Modifying factors:relationship,  disability income . Severity:baseline, not worse  Depression; fair No psychosis      Past Medical History:  Diagnosis Date  . Anxiety   . Arthritis   . Asthma    history [id number]  . Depression   . Essential hypertension 12/11/2011  . Gout   . Headache   . History of hiatal hernia   . History of lithotripsy   . Hypertension   . Kidney stones   . Lumbar degenerative disc disease 12/11/2011  . Nephrolithiasis 12/11/2011  . Pericarditis   . Renal tubular acidosis   . Rheumatoid arthritis(714.0)    Family History  Problem Relation Age of Onset  . Hypertension Father   . Alcohol abuse Father   . Heart attack Father 16  . Diabetes Mellitus II Father   . Coronary artery disease Father   . Diabetes Mellitus II Mother        Micah Flesher into a diabetic coma, per pt.  . Cancer - Colon Mother   . Coronary artery disease Mother   . Heart attack Mother 71  . Ovarian cancer Mother   . Uterine cancer Mother   . Heart  attack Brother   . Coronary artery disease Brother   . Heart murmur Sister   . Dementia Neg Hx   . Bipolar disorder Neg Hx   . Depression Neg Hx   . Drug abuse Neg Hx   . OCD Neg Hx     Outpatient Encounter Medications as of 10/06/2019  Medication Sig  . amLODipine (NORVASC) 10 MG tablet One tablet by mouth every day for blood pressure control.  Marland Kitchen atorvastatin (LIPITOR) 40 MG tablet Take 1 tablet (40 mg total) by mouth daily.  . cyclobenzaprine (FLEXERIL) 10 MG tablet Take 1 tablet (10 mg total) by mouth 3 (three) times daily as needed for muscle spasms.  Marland Kitchen escitalopram (LEXAPRO) 10 MG tablet Take 1 tablet (10 mg total) by mouth daily.  . fluticasone furoate-vilanterol (BREO ELLIPTA) 200-25 MCG/INH AEPB Inhale 1 puff into the lungs daily.  Marland Kitchen loratadine (CLARITIN) 10 MG tablet Take 1 tablet (10 mg total) by mouth daily.  . methotrexate (RHEUMATREX) 2.5 MG tablet Take 20 mg by mouth every Tuesday. Caution:Chemotherapy. Protect from light.   Marland Kitchen omeprazole (PRILOSEC) 40 MG capsule Take 1 capsule (40 mg total) by mouth daily.  Marland Kitchen oxyCODONE-acetaminophen (PERCOCET) 7.5-325 MG tablet Take 1 tablet by mouth 3 (three) times daily as needed.  . pregabalin (LYRICA) 50 MG capsule Take 1 capsule (50 mg total) by mouth daily.  . promethazine (  PHENERGAN) 25 MG tablet Take 1 tablet (25 mg total) by mouth every 6 (six) hours as needed for nausea or vomiting.  . SUMAtriptan (IMITREX) 100 MG tablet Take 0.5 tablets (50 mg total) by mouth every 2 (two) hours as needed for migraine. May repeat in 2 hours if headache persists or recurs.  . VENTOLIN HFA 108 (90 Base) MCG/ACT inhaler Inhale 1-2 puffs into the lungs every 4 (four) hours as needed for wheezing or shortness of breath.  . [DISCONTINUED] escitalopram (LEXAPRO) 10 MG tablet Take 1 tablet (10 mg total) by mouth daily.  . [DISCONTINUED] FLUoxetine (PROZAC) 40 MG capsule Take 1 capsule (40 mg total) by mouth 2 (two) times daily.  . [DISCONTINUED] traZODone  (DESYREL) 100 MG tablet Take 1 tablet (100 mg total) by mouth at bedtime as needed for sleep.   No facility-administered encounter medications on file as of 10/06/2019.    Recent Results (from the past 2160 hour(s))  CBC     Status: None   Collection Time: 07/12/19 11:36 AM  Result Value Ref Range   WBC 8.8 3.8 - 10.8 Thousand/uL   RBC 5.29 4.20 - 5.80 Million/uL   Hemoglobin 15.9 13.2 - 17.1 g/dL   HCT 62.9 38 - 50 %   MCV 88.3 80.0 - 100.0 fL   MCH 30.1 27.0 - 33.0 pg   MCHC 34.0 32.0 - 36.0 g/dL   RDW 52.8 41.3 - 24.4 %   Platelets 374 140 - 400 Thousand/uL   MPV 9.7 7.5 - 12.5 fL  COMPLETE METABOLIC PANEL WITH GFR     Status: None   Collection Time: 07/12/19 11:36 AM  Result Value Ref Range   Glucose, Bld 106 65 - 139 mg/dL    Comment: .        Non-fasting reference interval .    BUN 8 7 - 25 mg/dL   Creat 0.10 2.72 - 5.36 mg/dL    Comment: For patients >79 years of age, the reference limit for Creatinine is approximately 13% higher for people identified as African-American. .    GFR, Est Non African American 103 > OR = 60 mL/min/1.60m2   GFR, Est African American 120 > OR = 60 mL/min/1.58m2   BUN/Creatinine Ratio NOT APPLICABLE 6 - 22 (calc)   Sodium 137 135 - 146 mmol/L   Potassium 4.4 3.5 - 5.3 mmol/L   Chloride 102 98 - 110 mmol/L   CO2 29 20 - 32 mmol/L   Calcium 9.7 8.6 - 10.3 mg/dL   Total Protein 7.5 6.1 - 8.1 g/dL   Albumin 4.4 3.6 - 5.1 g/dL   Globulin 3.1 1.9 - 3.7 g/dL (calc)   AG Ratio 1.4 1.0 - 2.5 (calc)   Total Bilirubin 0.5 0.2 - 1.2 mg/dL   Alkaline phosphatase (APISO) 48 35 - 144 U/L   AST 20 10 - 35 U/L   ALT 25 9 - 46 U/L  Lipid panel     Status: Abnormal   Collection Time: 07/12/19 11:36 AM  Result Value Ref Range   Cholesterol 215 (H) <200 mg/dL   HDL 30 (L) > OR = 40 mg/dL   Triglycerides 644 <034 mg/dL   LDL Cholesterol (Calc) 162 (H) mg/dL (calc)    Comment: Reference range: <100 . Desirable range <100 mg/dL for primary  prevention;   <70 mg/dL for patients with CHD or diabetic patients  with > or = 2 CHD risk factors. Marland Kitchen LDL-C is now calculated using the Martin-Hopkins  calculation, which  is a validated novel method providing  better accuracy than the Friedewald equation in the  estimation of LDL-C.  Horald Pollen et al. Lenox Ahr. 1610;960(45): 2061-2068  (http://education.[employer].com/faq/FAQ164)    Total CHOL/HDL Ratio 7.2 (H) <5.0 (calc)   Non-HDL Cholesterol (Calc) 185 (H) <130 mg/dL (calc)    Comment: For patients with diabetes plus 1 major ASCVD risk  factor, treating to a non-HDL-C goal of <100 mg/dL  (LDL-C of <40 mg/dL) is considered a therapeutic  option.     There were no vitals taken for this visit.   Review of Systems  Cardiovascular: Negative for chest pain.  Skin: Negative for itching.  Psychiatric/Behavioral: Negative for depression and suicidal ideas.    Mental Status Examination  Appearance: Alert: Yes Attention: fair  Cooperative: Yes Eye Contact: Speech: coherent Psychomotor Activity: Normal Memory/Concentration: adequate Oriented: person, place, time/date and situation Mood: fair Affect: congruent Thought Processes and Associations: Coherent Fund of Knowledge: Fair Thought Content: Suicidal ideation and Homicidal ideation were denied. Denies hallucinations Insight: Fair Judgement: Fair  Diagnosis: Major depressive disorder recurrent moderate to severe. Generalized anxiety disorder. Insomnia. 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 Not worse. Not on trazadone now Anxiety : manageable on lexapro Pain effects mood , not worse I discussed the assessment and treatment plan with the patient. The patient was provided an opportunity to ask questions and all were answered. The patient agreed with the plan and demonstrated an understanding of the instructions.   The  patient was advised to call back or seek an in-person evaluation if the symptoms worsen or if the condition fails to improve as anticipated.  I provided 15 minutes of non-face-to-face time during this encounter. Reviewed meds,  FU 3-56m or earlier if needed    Thresa Ross, MD

## 2019-10-10 DIAGNOSIS — M069 Rheumatoid arthritis, unspecified: Secondary | ICD-10-CM | POA: Diagnosis not present

## 2019-10-10 DIAGNOSIS — M47817 Spondylosis without myelopathy or radiculopathy, lumbosacral region: Secondary | ICD-10-CM | POA: Diagnosis not present

## 2019-10-10 DIAGNOSIS — M47812 Spondylosis without myelopathy or radiculopathy, cervical region: Secondary | ICD-10-CM | POA: Diagnosis not present

## 2019-10-10 DIAGNOSIS — G894 Chronic pain syndrome: Secondary | ICD-10-CM | POA: Diagnosis not present

## 2019-10-10 DIAGNOSIS — Z79891 Long term (current) use of opiate analgesic: Secondary | ICD-10-CM | POA: Diagnosis not present

## 2019-11-06 ENCOUNTER — Telehealth: Payer: Self-pay | Admitting: *Deleted

## 2019-11-06 NOTE — Telephone Encounter (Signed)
Patient no showed PV today at 330 pm - Called patient and unable to leave  message to return call by 5 pm today - sent to voice mail with 1st ring- unable to LM as mailbox is full per recording -    PV and Procedure both canceled- No Show letter mailed to patient

## 2019-11-07 NOTE — Progress Notes (Signed)
NO SHW TO GI- DECLINE COLONOSCOPY ON HM

## 2019-11-08 DIAGNOSIS — G894 Chronic pain syndrome: Secondary | ICD-10-CM | POA: Diagnosis not present

## 2019-11-08 DIAGNOSIS — M47817 Spondylosis without myelopathy or radiculopathy, lumbosacral region: Secondary | ICD-10-CM | POA: Diagnosis not present

## 2019-11-08 DIAGNOSIS — M47812 Spondylosis without myelopathy or radiculopathy, cervical region: Secondary | ICD-10-CM | POA: Diagnosis not present

## 2019-11-08 DIAGNOSIS — M069 Rheumatoid arthritis, unspecified: Secondary | ICD-10-CM | POA: Diagnosis not present

## 2019-11-20 ENCOUNTER — Encounter: Payer: Medicare Other | Admitting: Gastroenterology

## 2019-11-22 ENCOUNTER — Ambulatory Visit: Payer: Medicare Other | Admitting: Physician Assistant

## 2019-12-11 DIAGNOSIS — G894 Chronic pain syndrome: Secondary | ICD-10-CM | POA: Diagnosis not present

## 2019-12-11 DIAGNOSIS — M47817 Spondylosis without myelopathy or radiculopathy, lumbosacral region: Secondary | ICD-10-CM | POA: Diagnosis not present

## 2019-12-11 DIAGNOSIS — M069 Rheumatoid arthritis, unspecified: Secondary | ICD-10-CM | POA: Diagnosis not present

## 2019-12-11 DIAGNOSIS — M47812 Spondylosis without myelopathy or radiculopathy, cervical region: Secondary | ICD-10-CM | POA: Diagnosis not present

## 2020-01-05 ENCOUNTER — Telehealth (INDEPENDENT_AMBULATORY_CARE_PROVIDER_SITE_OTHER): Payer: Medicare Other | Admitting: Psychiatry

## 2020-01-05 ENCOUNTER — Encounter (HOSPITAL_COMMUNITY): Payer: Self-pay | Admitting: Psychiatry

## 2020-01-05 DIAGNOSIS — F411 Generalized anxiety disorder: Secondary | ICD-10-CM | POA: Diagnosis not present

## 2020-01-05 DIAGNOSIS — F331 Major depressive disorder, recurrent, moderate: Secondary | ICD-10-CM | POA: Diagnosis not present

## 2020-01-05 MED ORDER — ESCITALOPRAM OXALATE 10 MG PO TABS: 10.0000 mg | ORAL_TABLET | Freq: Every day | ORAL | 2 refills | Status: DC

## 2020-01-05 NOTE — Progress Notes (Signed)
Patient ID: [name], male   DOB: 1970-10-20, 49 y.o.   MRN: [id number] [facility] Follow-up Outpatient Visit  JERMICHAEL BELMARES [id number] 49 y.o.  01/05/2020  Chief Complaint: follow up for depression and anxiety.   History of Present Illness:   I connected with ELVA BREAKER on 01/05/20 at 12:30 PM EST by telephone and verified that I am speaking with the correct person using two identifiers.   I discussed the limitations, risks, security and privacy concerns of performing an evaluation and management service by telephone and the availability of in person appointments. I also discussed with the patient that there may be a patient responsible charge related to this service. The patient expressed understanding and agreed to proceed.  Patient location  Home Provider location : home office  Remains stable, baseline on lexapro  Has grand baby visits that help  Modifying factors:relationship,  disability income . Severity:baseline, Depression; fair No psychosis      Past Medical History:  Diagnosis Date  . Anxiety   . Arthritis   . Asthma    history [id number]  . Depression   . Essential hypertension 12/11/2011  . Gout   . Headache   . History of hiatal hernia   . History of lithotripsy   . Hypertension   . Kidney stones   . Lumbar degenerative disc disease 12/11/2011  . Nephrolithiasis 12/11/2011  . Pericarditis   . Renal tubular acidosis   . Rheumatoid arthritis(714.0)    Family History  Problem Relation Age of Onset  . Hypertension Father   . Alcohol abuse Father   . Heart attack Father 49  . Diabetes Mellitus II Father   . Coronary artery disease Father   . Diabetes Mellitus II Mother        Micah Flesher into a diabetic coma, per pt.  . Cancer - Colon Mother   . Coronary artery disease Mother   . Heart attack Mother 72  . Ovarian cancer Mother   . Uterine cancer Mother   . Heart attack Brother   . Coronary artery disease Brother   . Heart  murmur Sister   . Dementia Neg Hx   . Bipolar disorder Neg Hx   . Depression Neg Hx   . Drug abuse Neg Hx   . OCD Neg Hx     Outpatient Encounter Medications as of 01/05/2020  Medication Sig  . amLODipine (NORVASC) 10 MG tablet One tablet by mouth every day for blood pressure control.  Marland Kitchen atorvastatin (LIPITOR) 40 MG tablet Take 1 tablet (40 mg total) by mouth daily.  . cyclobenzaprine (FLEXERIL) 10 MG tablet Take 1 tablet (10 mg total) by mouth 3 (three) times daily as needed for muscle spasms.  Marland Kitchen escitalopram (LEXAPRO) 10 MG tablet Take 1 tablet (10 mg total) by mouth daily.  . fluticasone furoate-vilanterol (BREO ELLIPTA) 200-25 MCG/INH AEPB Inhale 1 puff into the lungs daily.  Marland Kitchen loratadine (CLARITIN) 10 MG tablet Take 1 tablet (10 mg total) by mouth daily.  . methotrexate (RHEUMATREX) 2.5 MG tablet Take 20 mg by mouth every Tuesday. Caution:Chemotherapy. Protect from light.   Marland Kitchen omeprazole (PRILOSEC) 40 MG capsule Take 1 capsule (40 mg total) by mouth daily.  Marland Kitchen oxyCODONE-acetaminophen (PERCOCET) 7.5-325 MG tablet Take 1 tablet by mouth 3 (three) times daily as needed.  . pregabalin (LYRICA) 50 MG capsule Take 1 capsule (50 mg total) by mouth daily.  . promethazine (PHENERGAN) 25 MG tablet Take 1 tablet (25 mg total) by  mouth every 6 (six) hours as needed for nausea or vomiting.  . SUMAtriptan (IMITREX) 100 MG tablet Take 0.5 tablets (50 mg total) by mouth every 2 (two) hours as needed for migraine. May repeat in 2 hours if headache persists or recurs.  . VENTOLIN HFA 108 (90 Base) MCG/ACT inhaler Inhale 1-2 puffs into the lungs every 4 (four) hours as needed for wheezing or shortness of breath.  . [DISCONTINUED] escitalopram (LEXAPRO) 10 MG tablet Take 1 tablet (10 mg total) by mouth daily.  . [DISCONTINUED] FLUoxetine (PROZAC) 40 MG capsule Take 1 capsule (40 mg total) by mouth 2 (two) times daily.  . [DISCONTINUED] traZODone (DESYREL) 100 MG tablet Take 1 tablet (100 mg total) by mouth  at bedtime as needed for sleep.   No facility-administered encounter medications on file as of 01/05/2020.    No results found for this or any previous visit (from the past 2160 hour(s)).  There were no vitals taken for this visit.   Review of Systems  Cardiovascular: Negative for chest pain.  Skin: Negative for itching.  Psychiatric/Behavioral: Negative for depression and suicidal ideas.    Mental Status Examination  Appearance: Alert: Yes Attention: fair  Cooperative: Yes Eye Contact: Speech: coherent Psychomotor Activity: Normal Memory/Concentration: adequate Oriented: person, place, time/date and situation Mood: fair Affect: congruent Thought Processes and Associations: Coherent Fund of Knowledge: Fair Thought Content: Suicidal ideation and Homicidal ideation were denied. Denies hallucinations Insight: Fair Judgement: Fair  Diagnosis: Major depressive disorder recurrent moderate to severe. Generalized anxiety disorder. Insomnia. 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 Not worse  Anxiety : manageable on lexapro Pain effects mood , not worse I discussed the assessment and treatment plan with the patient. The patient was provided an opportunity to ask questions and all were answered. The patient agreed with the plan and demonstrated an understanding of the instructions.   The patient was advised to call back or seek an in-person evaluation if the symptoms worsen or if the condition fails to improve as anticipated.  I provided 15 minutes of non-face-to-face time during this encounter. Reviewed meds,  FU 3-46m or earlier if needed    Thresa Ross, MD

## 2020-01-09 DIAGNOSIS — M47812 Spondylosis without myelopathy or radiculopathy, cervical region: Secondary | ICD-10-CM | POA: Diagnosis not present

## 2020-01-09 DIAGNOSIS — M069 Rheumatoid arthritis, unspecified: Secondary | ICD-10-CM | POA: Diagnosis not present

## 2020-01-09 DIAGNOSIS — G894 Chronic pain syndrome: Secondary | ICD-10-CM | POA: Diagnosis not present

## 2020-01-09 DIAGNOSIS — M47817 Spondylosis without myelopathy or radiculopathy, lumbosacral region: Secondary | ICD-10-CM | POA: Diagnosis not present

## 2020-01-24 ENCOUNTER — Telehealth: Payer: Self-pay | Admitting: General Practice

## 2020-01-31 NOTE — Telephone Encounter (Signed)
Documentation

## 2020-02-07 DIAGNOSIS — M47817 Spondylosis without myelopathy or radiculopathy, lumbosacral region: Secondary | ICD-10-CM | POA: Diagnosis not present

## 2020-02-07 DIAGNOSIS — G894 Chronic pain syndrome: Secondary | ICD-10-CM | POA: Diagnosis not present

## 2020-02-07 DIAGNOSIS — M47812 Spondylosis without myelopathy or radiculopathy, cervical region: Secondary | ICD-10-CM | POA: Diagnosis not present

## 2020-02-07 DIAGNOSIS — M069 Rheumatoid arthritis, unspecified: Secondary | ICD-10-CM | POA: Diagnosis not present

## 2020-02-16 ENCOUNTER — Other Ambulatory Visit: Payer: Self-pay | Admitting: Physical Medicine and Rehabilitation

## 2020-02-16 DIAGNOSIS — M5416 Radiculopathy, lumbar region: Secondary | ICD-10-CM

## 2020-02-20 ENCOUNTER — Telehealth: Payer: Self-pay

## 2020-02-20 NOTE — Telephone Encounter (Signed)
Error

## 2020-02-26 ENCOUNTER — Encounter: Payer: Self-pay | Admitting: Osteopathic Medicine

## 2020-02-26 ENCOUNTER — Ambulatory Visit (INDEPENDENT_AMBULATORY_CARE_PROVIDER_SITE_OTHER): Payer: Medicare Other | Admitting: Osteopathic Medicine

## 2020-02-26 ENCOUNTER — Other Ambulatory Visit: Payer: Self-pay

## 2020-02-26 ENCOUNTER — Other Ambulatory Visit: Payer: Self-pay | Admitting: Osteopathic Medicine

## 2020-02-26 ENCOUNTER — Ambulatory Visit (INDEPENDENT_AMBULATORY_CARE_PROVIDER_SITE_OTHER): Payer: Medicare Other

## 2020-02-26 VITALS — BP 160/102 | HR 73 | Wt 239.1 lb

## 2020-02-26 DIAGNOSIS — R0602 Shortness of breath: Secondary | ICD-10-CM

## 2020-02-26 DIAGNOSIS — I10 Essential (primary) hypertension: Secondary | ICD-10-CM | POA: Diagnosis not present

## 2020-02-26 DIAGNOSIS — G43809 Other migraine, not intractable, without status migrainosus: Secondary | ICD-10-CM | POA: Diagnosis not present

## 2020-02-26 DIAGNOSIS — M25551 Pain in right hip: Secondary | ICD-10-CM | POA: Diagnosis not present

## 2020-02-26 DIAGNOSIS — G894 Chronic pain syndrome: Secondary | ICD-10-CM

## 2020-02-26 MED ORDER — AMLODIPINE BESYLATE 10 MG PO TABS: ORAL_TABLET | ORAL | 3 refills | Status: DC

## 2020-02-26 MED ORDER — BREO ELLIPTA 200-25 MCG/INH IN AEPB: 1.0000 | INHALATION_SPRAY | Freq: Every day | RESPIRATORY_TRACT | 11 refills | Status: DC

## 2020-02-26 MED ORDER — CYCLOBENZAPRINE HCL 10 MG PO TABS: 10.0000 mg | ORAL_TABLET | Freq: Three times a day (TID) | ORAL | 5 refills | Status: DC | PRN

## 2020-02-26 MED ORDER — VENTOLIN HFA 108 (90 BASE) MCG/ACT IN AERS: 1.0000 | INHALATION_SPRAY | RESPIRATORY_TRACT | 11 refills | Status: DC | PRN

## 2020-02-26 MED ORDER — SUMATRIPTAN SUCCINATE 100 MG PO TABS: 50.0000 mg | ORAL_TABLET | ORAL | 3 refills | Status: DC | PRN

## 2020-02-26 NOTE — Progress Notes (Signed)
HPI: [name] is a 50 y.o. male who  has a past medical history of Anxiety, Arthritis, Asthma, Depression, Essential hypertension (12/11/2011), Gout, Headache, History of hiatal hernia, History of lithotripsy, Hypertension, Kidney stones, Lumbar degenerative disc disease (12/11/2011), Nephrolithiasis (12/11/2011), Pericarditis, Renal tubular acidosis, and Rheumatoid arthritis(714.0).  he presents to [facility] today, 02/26/20,  for chief complaint of:  F/U HTN  Has been out of BP Rx x2 weeks, BP today measuring high 160/102 on second check. I sent 1 year of refills amlodipine 10 mg back 06/2019. Today reports home BP also high w/o Rx ubt when he was on Rx 120s/80s  Last lipids and DM2 screen 06/2019  LDL 162, HDL 30, TG 115  Glc 106  Other specialists: seeing psychiatry for MDD, seeing pain management,   New problem: past few weeks noting some pain in R groin/hip area, nothing into scrotal area, some radiation into the back. No falling. On exam pain w/ FABER and FADIR, neg log roll, some weakness w/ resisted hip extension     ASSESSMENT/PLAN: The primary encounter diagnosis was Right hip pain. Diagnoses of Essential hypertension, Chronic pain syndrome, SOB (shortness of breath), and Other migraine without status migrainosus, not intractable were also pertinent to this visit.  Uncontrolled BP. Advised on importance of consistent use Rx  Hip pain seems likely arthritis vs labral issue vs tendinitis, sports med referral advised pending XR. Following w/ pain mgt, lumbar MRI pending w/ them. Refilled Flexeril   Refilled other meds for COPD   Orders Placed This Encounter  Procedures  . DG Hip Unilat W OR W/O Pelvis 2-3 Views Right     Meds ordered this encounter  Medications  . amLODipine (NORVASC) 10 MG tablet    Sig: One tablet by mouth every day for blood pressure control.    Dispense:  90 tablet    Refill:  3  . cyclobenzaprine  (FLEXERIL) 10 MG tablet    Sig: Take 1 tablet (10 mg total) by mouth 3 (three) times daily as needed for muscle spasms.    Dispense:  90 tablet    Refill:  5  . fluticasone furoate-vilanterol (BREO ELLIPTA) 200-25 MCG/INH AEPB    Sig: Inhale 1 puff into the lungs daily.    Dispense:  60 each    Refill:  11  . SUMAtriptan (IMITREX) 100 MG tablet    Sig: Take 0.5 tablets (50 mg total) by mouth every 2 (two) hours as needed for migraine. May repeat in 2 hours if headache persists or recurs.    Dispense:  10 tablet    Refill:  3  . VENTOLIN HFA 108 (90 Base) MCG/ACT inhaler    Sig: Inhale 1-2 puffs into the lungs every 4 (four) hours as needed for wheezing or shortness of breath.    Dispense:  18 g    Refill:  11    NEEDS APPOINTMENT WITH PCP FOR FURTHER REFILLS.    There are no Patient Instructions on file for this visit.    Follow-up plan: Return in about 6 months (around 08/25/2020) for [place] (call week prior to visit for lab orders).                                                 ################################################# ################################################# ################################################# #################################################    Current Meds  Medication Sig  . atorvastatin (LIPITOR) 40 MG tablet Take 1 tablet (40 mg total) by mouth daily.  Marland Kitchen escitalopram (LEXAPRO) 10 MG tablet Take 1 tablet (10 mg total) by mouth daily.  Marland Kitchen loratadine (CLARITIN) 10 MG tablet Take 1 tablet (10 mg total) by mouth daily.  . methotrexate (RHEUMATREX) 2.5 MG tablet Take 20 mg by mouth every Tuesday. Caution:Chemotherapy. Protect from light.  Marland Kitchen omeprazole (PRILOSEC) 40 MG capsule Take 1 capsule (40 mg total) by mouth daily.  Marland Kitchen oxyCODONE-acetaminophen (PERCOCET) 7.5-325 MG tablet Take 1 tablet by mouth 3 (three) times daily as needed.  . pregabalin (LYRICA) 50 MG capsule Take 1 capsule (50 mg total) by  mouth daily.  . promethazine (PHENERGAN) 25 MG tablet Take 1 tablet (25 mg total) by mouth every 6 (six) hours as needed for nausea or vomiting.  . [DISCONTINUED] amLODipine (NORVASC) 10 MG tablet One tablet by mouth every day for blood pressure control.  . [DISCONTINUED] cyclobenzaprine (FLEXERIL) 10 MG tablet Take 1 tablet (10 mg total) by mouth 3 (three) times daily as needed for muscle spasms.  . [DISCONTINUED] fluticasone furoate-vilanterol (BREO ELLIPTA) 200-25 MCG/INH AEPB Inhale 1 puff into the lungs daily.  . [DISCONTINUED] SUMAtriptan (IMITREX) 100 MG tablet Take 0.5 tablets (50 mg total) by mouth every 2 (two) hours as needed for migraine. May repeat in 2 hours if headache persists or recurs.  . [DISCONTINUED] VENTOLIN HFA 108 (90 Base) MCG/ACT inhaler Inhale 1-2 puffs into the lungs every 4 (four) hours as needed for wheezing or shortness of breath.    Allergies  Allergen Reactions  . Lisinopril Cough  . Benadryl [Diphenhydramine Hcl] Anxiety       Review of Systems: Pertinent (+) and (-) ROS in HPI as above   Exam:  BP (!) 160/102   Pulse 73   Wt 239 lb 1.9 oz (108.5 kg)   SpO2 95%   BMI 37.45 kg/m   Constitutional: VS see above. General Appearance: alert, well-developed, well-nourished, NAD  Neck: No masses, trachea midline.   Respiratory: Normal respiratory effort. no wheeze, no rhonchi, no rales  Cardiovascular: S1/S2 normal, no murmur, no rub/gallop auscultated. RRR.   Musculoskeletal: Gait normal. Symmetric and independent movement of all extremities, see above re: R hip   Abdominal: non-tender, non-distended  Neurological: Normal balance/coordination. No tremor.  Skin: warm, dry, intact.   Psychiatric: Normal judgment/insight. Normal mood and affect. Oriented x3.       Visit summary with medication list and pertinent instructions was printed for patient to review, patient was advised to alert [place] if any updates are needed. All questions at time of  visit were answered - patient instructed to contact office with any additional concerns. ER/RTC precautions were reviewed with the patient and understanding verbalized.    Please note: voice recognition software was used to produce this document, and typos may escape review. Please contact Dr. Lyn Hollingshead for any needed clarifications.    Follow up plan: Return in about 6 months (around 08/25/2020) for [place] (call week prior to visit for lab orders).

## 2020-02-26 NOTE — Telephone Encounter (Signed)
Per [facility] - ventolin inhaler not covered by insurance. Preferred formulary is proair inhaler. Pls send an updated rx to the pharmacy.

## 2020-02-27 ENCOUNTER — Encounter: Payer: Self-pay | Admitting: Osteopathic Medicine

## 2020-03-16 ENCOUNTER — Other Ambulatory Visit: Payer: Self-pay

## 2020-03-16 ENCOUNTER — Ambulatory Visit
Admission: RE | Admit: 2020-03-16 | Discharge: 2020-03-16 | Disposition: A | Discharge status: Home / Self Care | Payer: Medicare Other | Source: Ambulatory Visit | Attending: Physical Medicine and Rehabilitation | Admitting: Physical Medicine and Rehabilitation

## 2020-03-16 DIAGNOSIS — M5416 Radiculopathy, lumbar region: Secondary | ICD-10-CM

## 2020-04-10 ENCOUNTER — Telehealth (INDEPENDENT_AMBULATORY_CARE_PROVIDER_SITE_OTHER): Payer: Medicare Other | Admitting: Psychiatry

## 2020-04-10 ENCOUNTER — Encounter (HOSPITAL_COMMUNITY): Payer: Self-pay | Admitting: Psychiatry

## 2020-04-10 DIAGNOSIS — F411 Generalized anxiety disorder: Secondary | ICD-10-CM

## 2020-04-10 DIAGNOSIS — F331 Major depressive disorder, recurrent, moderate: Secondary | ICD-10-CM | POA: Diagnosis not present

## 2020-04-10 NOTE — Progress Notes (Signed)
Patient ID: [name], male   DOB: 1970-03-06, 50 y.o.   MRN: [id number] [facility] Follow-up Outpatient Visit  TUPAC JEFFUS [id number] 50 y.o.  04/10/2020  Chief Complaint: follow up for depression and anxiety.   History of Present Illness:   Virtual Visit via Telephone Note  I connected with [name] on 04/10/20 at  2:00 PM EDT by telephone and verified that I am speaking with the correct person using two identifiers.  Location: Patient: home Provider: home office   I discussed the limitations, risks, security and privacy concerns of performing an evaluation and management service by telephone and the availability of in person appointments. I also discussed with the patient that there may be a patient responsible charge related to this service. The patient expressed understanding and agreed to proceed.     I discussed the assessment and treatment plan with the patient. The patient was provided an opportunity to ask questions and all were answered. The patient agreed with the plan and demonstrated an understanding of the instructions.   The patient was advised to call back or seek an in-person evaluation if the symptoms worsen or if the condition fails to improve as anticipated.  I provided 8 - 10 minutes of non-face-to-face time during this encounter.   Thresa Ross, MD  Remains stable, baseline on lexapro  Handling anxiety and depression, grand kid keeps him busy Gets pain managed by percocet follows with pain clinic Modifying factor: grand kid and wife Severity beter      Past Medical History:  Diagnosis Date  . Anxiety   . Arthritis   . Asthma    history [id number]  . Depression   . Essential hypertension 12/11/2011  . Gout   . Headache   . History of hiatal hernia   . History of lithotripsy   . Hypertension   . Kidney stones   . Lumbar degenerative disc disease 12/11/2011  . Nephrolithiasis 12/11/2011  . Pericarditis   .  Renal tubular acidosis   . Rheumatoid arthritis(714.0)    Family History  Problem Relation Age of Onset  . Hypertension Father   . Alcohol abuse Father   . Heart attack Father 41  . Diabetes Mellitus II Father   . Coronary artery disease Father   . Diabetes Mellitus II Mother        Micah Flesher into a diabetic coma, per pt.  . Cancer - Colon Mother   . Coronary artery disease Mother   . Heart attack Mother 54  . Ovarian cancer Mother   . Uterine cancer Mother   . Heart attack Brother   . Coronary artery disease Brother   . Heart murmur Sister   . Dementia Neg Hx   . Bipolar disorder Neg Hx   . Depression Neg Hx   . Drug abuse Neg Hx   . OCD Neg Hx     Outpatient Encounter Medications as of 04/10/2020  Medication Sig  . albuterol (VENTOLIN HFA) 108 (90 Base) MCG/ACT inhaler Inhale 2 puffs into the lungs every 6 (six) hours as needed for wheezing.  Marland Kitchen amLODipine (NORVASC) 10 MG tablet One tablet by mouth every day for blood pressure control.  Marland Kitchen atorvastatin (LIPITOR) 40 MG tablet Take 1 tablet (40 mg total) by mouth daily.  . cyclobenzaprine (FLEXERIL) 10 MG tablet Take 1 tablet (10 mg total) by mouth 3 (three) times daily as needed for muscle spasms.  Marland Kitchen escitalopram (LEXAPRO) 10 MG tablet Take 1 tablet (  10 mg total) by mouth daily.  . fluticasone furoate-vilanterol (BREO ELLIPTA) 200-25 MCG/INH AEPB Inhale 1 puff into the lungs daily.  Marland Kitchen loratadine (CLARITIN) 10 MG tablet Take 1 tablet (10 mg total) by mouth daily.  . methotrexate (RHEUMATREX) 2.5 MG tablet Take 20 mg by mouth every Tuesday. Caution:Chemotherapy. Protect from light.  Marland Kitchen omeprazole (PRILOSEC) 40 MG capsule Take 1 capsule (40 mg total) by mouth daily.  Marland Kitchen oxyCODONE-acetaminophen (PERCOCET) 7.5-325 MG tablet Take 1 tablet by mouth 3 (three) times daily as needed.  . pregabalin (LYRICA) 50 MG capsule Take 1 capsule (50 mg total) by mouth daily.  . promethazine (PHENERGAN) 25 MG tablet Take 1 tablet (25 mg total) by mouth  every 6 (six) hours as needed for nausea or vomiting.  . SUMAtriptan (IMITREX) 100 MG tablet Take 0.5 tablets (50 mg total) by mouth every 2 (two) hours as needed for migraine. May repeat in 2 hours if headache persists or recurs.  . [DISCONTINUED] FLUoxetine (PROZAC) 40 MG capsule Take 1 capsule (40 mg total) by mouth 2 (two) times daily.  . [DISCONTINUED] traZODone (DESYREL) 100 MG tablet Take 1 tablet (100 mg total) by mouth at bedtime as needed for sleep.   No facility-administered encounter medications on file as of 04/10/2020.    No results found for this or any previous visit (from the past 2160 hour(s)).  There were no vitals taken for this visit.   Review of Systems  Cardiovascular: Negative for chest pain.  Skin: Negative for itching.  Psychiatric/Behavioral: Negative for depression and suicidal ideas.    Mental Status Examination  Appearance: Alert: Yes Attention: fair  Cooperative: Yes Eye Contact: Speech: coherent Psychomotor Activity: Normal Memory/Concentration: adequate Oriented: person, place, time/date and situation Mood: fair Affect: congruent Thought Processes and Associations: Coherent Fund of Knowledge: Fair Thought Content: Suicidal ideation and Homicidal ideation were denied. Denies hallucinations Insight: Fair Judgement: Fair  Diagnosis: Major depressive disorder recurrent moderate to severe. Generalized anxiety disorder. Insomnia. 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 Not worse  Anxiety : doing fair on lexapro  Pain effects mood , not worse Fu 49m.   Thresa Ross, MD

## 2020-04-12 ENCOUNTER — Telehealth (HOSPITAL_COMMUNITY): Payer: Medicare Other | Admitting: Psychiatry

## 2020-06-17 ENCOUNTER — Ambulatory Visit (INDEPENDENT_AMBULATORY_CARE_PROVIDER_SITE_OTHER): Payer: Medicare Other | Admitting: Osteopathic Medicine

## 2020-06-17 DIAGNOSIS — Z1211 Encounter for screening for malignant neoplasm of colon: Secondary | ICD-10-CM | POA: Diagnosis not present

## 2020-06-17 DIAGNOSIS — Z0000 Encounter for general adult medical examination without abnormal findings: Secondary | ICD-10-CM

## 2020-06-17 NOTE — Patient Instructions (Addendum)
MEDICARE ANNUAL WELLNESS VISIT Health Maintenance Summary and Written Plan of Care  [name] ,  Thank you for allowing me to perform your Medicare Annual Wellness Visit and for your ongoing commitment to your health.   Health Maintenance & Immunization History Health Maintenance  Topic Date Due  . HIV Screening  Never done  . Hepatitis C Screening  Never done  . COVID-19 Vaccine (3 - Booster for Moderna series) 03/10/2020  . COLONOSCOPY (Pts 45-41yrs Insurance coverage will need to be confirmed)  11/06/2020 (Originally 05/01/2014)  . INFLUENZA VACCINE  08/26/2020  . TETANUS/TDAP  11/11/2024  . HPV VACCINES  Aged Out   Immunization History  Administered Date(s) Administered  . Influenza, Seasonal, Injecte, Preservative Fre 02/12/2012  . Influenza,inj,Quad PF,6+ Mos 10/02/2014, 09/25/2015, 12/30/2016, 01/27/2018  . Moderna Sars-Covid-2 Vaccination 09/11/2019, 10/09/2019  . PPD Test 05/14/2014  . Tdap 11/12/2014  . Zoster Recombinat (Shingrix) 06/16/2020    These are the patient goals that we discussed: Goals Addressed              This Visit's Progress   .  Patient Stated (pt-stated)        06/17/2020 AWV Goal: Tobacco Cessation  Smoking cessation instruction/counseling given:  counseled patient on the dangers of tobacco use, advised patient to stop smoking, and reviewed strategies to maximize success   Patient will verbalize understanding of the health risks associated with smoking/tobacco use  Lung cancer or lung disease, such as COPD  Heart disease.  Stroke.  Heart attack  Infertility  Osteoporosis and bone fractures. . Patient will create a plan to quit smoking/using tobacco  Pick a date to quit.   Write down the reasons why you are quitting and put it where you will see it often.  Identify the people, places, things, and activities that make you want to smoke (triggers) and avoid them. Make sure to take these actions: ? Throw away all cigarettes at  home, at work, and in your car. ? Throw away smoking accessories, such as Set designer. ? Clean your car and make sure to empty the ashtray. ? Clean your home, including curtains and carpets.  Tell your family, friends, and coworkers that you are quitting. Support from your loved ones can make quitting easier.  Talk with your health care provider about your options for quitting smoking.  Find out what treatment options are covered by your health insurance. . Patient will be able to demonstrate knowledge of tobacco cessation strategies that may maximize success  Quitting "cold [place]" is more successful than gradually quitting.  Attending in-person counseling to help you build problem-solving skills.   Finding resources and support systems that can help you to quit smoking and remain smoke-free after you quit. These resources are most helpful when you use them often. They can include: ? Online chats with a Veterinary surgeon. ? Telephone quitlines. ? Automotive engineer. ? Support groups or group counseling. ? Text messaging programs. ? Mobile phone applications.  Taking medicines to help you quit smoking: ? Nicotine patches, gum, or lozenges. ? Nicotine inhalers or sprays. ? Non-nicotine medicine that is taken by mouth. . Patient will note get discouraged if the process is difficult . Over the next year, patient will stop smoking or using other forms of tobacco  Smoking cessation instruction/counseling given:  counseled patient on the dangers of tobacco use, advised patient to stop smoking, and reviewed strategies to maximize success          This is  a list of Health Maintenance Items that are overdue or due now: Health Maintenance Due  Topic Date Due  . HIV Screening  Never done  . Hepatitis C Screening  Never done  . COVID-19 Vaccine (3 - Booster for Moderna series) 03/10/2020     Orders/Referrals Placed Today: Orders Placed This Encounter  Procedures  .  Ambulatory referral to Gastroenterology (for Colonoscopy)    Referral Priority:   Routine    Referral Type:   Consultation    Referral Reason:   Specialty Services Required    Number of Visits Requested:   1   (Contact our referral department at [phone] if you have not spoken with someone about your referral appointment within the next 5 days)    Follow-up Plan . Follow-up with Sunnie Nielsen, DO as planned . Schedule your next shingles vaccine.  . Referral for your colonoscopy has been sent.  . Medicare wellness visit in one year.       Colonoscopy, Adult A colonoscopy is a procedure to look at the entire large intestine. This procedure is done using a long, thin, flexible tube that has a camera on the end. You may have a colonoscopy:  As a part of normal colorectal screening.  If you have certain symptoms, such as: ? A low number of red blood cells in your blood (anemia). ? Diarrhea that does not go away. ? Pain in your abdomen. ? Blood in your stool. A colonoscopy can help screen for and diagnose medical problems, including:  Tumors.  Extra tissue that grows where mucus forms (polyps).  Inflammation.  Areas of bleeding. Tell your health care provider about:  Any allergies you have.  All medicines you are taking, including vitamins, herbs, eye drops, creams, and over-the-counter medicines.  Any problems you or family members have had with anesthetic medicines.  Any blood disorders you have.  Any surgeries you have had.  Any medical conditions you have.  Any problems you have had with having bowel movements.  Whether you are pregnant or may be pregnant. What are the risks? Generally, this is a safe procedure. However, problems may occur, including:  Bleeding.  Damage to your intestine.  Allergic reactions to medicines given during the procedure.  Infection. This is rare. What happens before the procedure? Eating and drinking  restrictions Follow instructions from your health care provider about eating or drinking restrictions, which may include:  A few days before the procedure: ? Follow a low-fiber diet. ? Avoid nuts, seeds, dried fruit, raw fruits, and vegetables.  1-3 days before the procedure: ? Eat only gelatin dessert or ice pops. ? Drink only clear liquids, such as water, clear juice, clear broth or bouillon, black coffee or tea, or clear soft drinks or sports drinks. ? Avoid liquids that contain red or purple dye.  The day of the procedure: ? Do not eat solid foods. You may continue to drink clear liquids until up to 2 hours before the procedure. ? Do not eat or drink anything starting 2 hours before the procedure, or within the time period that your health care provider recommends. Bowel prep If you were prescribed a bowel prep to take by mouth (orally) to clean out your colon:  Take it as told by your health care provider. Starting the day before your procedure, you will need to drink a large amount of liquid medicine. The liquid will cause you to have many bowel movements of loose stool until your stool becomes almost clear  or light green.  If your skin or the opening between the buttocks (anus) gets irritated from diarrhea, you may relieve the irritation using: ? Wipes with medicine in them, such as adult wet wipes with aloe and vitamin E. ? A product to soothe skin, such as petroleum jelly.  If you vomit while drinking the bowel prep: ? Take a break for up to 60 minutes. ? Begin the bowel prep again. ? Call your health care provider if you keep vomiting or you cannot take the bowel prep without vomiting.  To clean out your colon, you may also be given: ? Laxative medicines. These help you have a bowel movement. ? Instructions for enema use. An enema is liquid medicine injected into your rectum. Medicines Ask your health care provider about:  Changing or stopping your regular medicines or  supplements. This is especially important if you are taking iron supplements, diabetes medicines, or blood thinners.  Taking medicines such as aspirin and ibuprofen. These medicines can thin your blood. Do not take these medicines unless your health care provider tells you to take them.  Taking over-the-counter medicines, vitamins, herbs, and supplements. General instructions  Ask your health care provider what steps will be taken to help prevent infection. These may include washing skin with a germ-killing soap.  Plan to have someone take you home from the hospital or clinic. What happens during the procedure?  An IV will be inserted into one of your veins.  You may be given one or more of the following: ? A medicine to help you relax (sedative). ? A medicine to numb the area (local anesthetic). ? A medicine to make you fall asleep (general anesthetic). This is rarely needed.  You will lie on your side with your knees bent.  The tube will: ? Have oil or gel put on it (be lubricated). ? Be inserted into your anus. ? Be gently eased through all parts of your large intestine.  Air will be sent into your colon to keep it open. This may cause some pressure or cramping.  Images will be taken with the camera and will appear on a screen.  A small tissue sample may be removed to be looked at under a microscope (biopsy). The tissue may be sent to a lab for testing if any signs of problems are found.  If small polyps are found, they may be removed and checked for cancer cells.  When the procedure is finished, the tube will be removed. The procedure may vary among health care providers and hospitals.   What happens after the procedure?  Your blood pressure, heart rate, breathing rate, and blood oxygen level will be monitored until you leave the hospital or clinic.  You may have a small amount of blood in your stool.  You may pass gas and have mild cramping or bloating in your abdomen.  This is caused by the air that was used to open your colon during the exam.  Do not drive for 24 hours after the procedure.  It is up to you to get the results of your procedure. Ask your health care provider, or the department that is doing the procedure, when your results will be ready. Summary  A colonoscopy is a procedure to look at the entire large intestine.  Follow instructions from your health care provider about eating and drinking before the procedure.  If you were prescribed an oral bowel prep to clean out your colon, take it as told by  your health care provider.  During the colonoscopy, a flexible tube with a camera on its end is inserted into the anus and then passed into the other parts of the large intestine. This information is not intended to replace advice given to you by your health care provider. Make sure you discuss any questions you have with your health care provider. Document Revised: 08/05/2018 Document Reviewed: 08/05/2018 Elsevier Patient Education  2021 Elsevier Inc.   Health Maintenance, Male Adopting a healthy lifestyle and getting preventive care are important in promoting health and wellness. Ask your health care provider about:  The right schedule for you to have regular tests and exams.  Things you can do on your own to prevent diseases and keep yourself healthy. What should I know about diet, weight, and exercise? Eat a healthy diet  Eat a diet that includes plenty of vegetables, fruits, low-fat dairy products, and lean protein.  Do not eat a lot of foods that are high in solid fats, added sugars, or sodium.   Maintain a healthy weight Body mass index (BMI) is a measurement that can be used to identify possible weight problems. It estimates body fat based on height and weight. Your health care provider can help determine your BMI and help you achieve or maintain a healthy weight. Get regular exercise Get regular exercise. This is one of the most  important things you can do for your health. Most adults should:  Exercise for at least 150 minutes each week. The exercise should increase your heart rate and make you sweat (moderate-intensity exercise).  Do strengthening exercises at least twice a week. This is in addition to the moderate-intensity exercise.  Spend less time sitting. Even light physical activity can be beneficial. Watch cholesterol and blood lipids Have your blood tested for lipids and cholesterol at 51 years of age, then have this test every 5 years. You may need to have your cholesterol levels checked more often if:  Your lipid or cholesterol levels are high.  You are older than 51 years of age.  You are at high risk for heart disease. What should I know about cancer screening? Many types of cancers can be detected early and may often be prevented. Depending on your health history and family history, you may need to have cancer screening at various ages. This may include screening for:  Colorectal cancer.  Prostate cancer.  Skin cancer.  Lung cancer. What should I know about heart disease, diabetes, and high blood pressure? Blood pressure and heart disease  High blood pressure causes heart disease and increases the risk of stroke. This is more likely to develop in people who have high blood pressure readings, are of African descent, or are overweight.  Talk with your health care provider about your target blood pressure readings.  Have your blood pressure checked: ? Every 3-5 years if you are 19-31 years of age. ? Every year if you are 64 years old or older.  If you are between the ages of 105 and 23 and are a current or former smoker, ask your health care provider if you should have a one-time screening for abdominal aortic aneurysm (AAA). Diabetes Have regular diabetes screenings. This checks your fasting blood sugar level. Have the screening done:  Once every three years after age 81 if you are at a  normal weight and have a low risk for diabetes.  More often and at a younger age if you are overweight or have a high risk  for diabetes. What should I know about preventing infection? Hepatitis B If you have a higher risk for hepatitis B, you should be screened for this virus. Talk with your health care provider to find out if you are at risk for hepatitis B infection. Hepatitis C Blood testing is recommended for:  Everyone born from [birth year] through 1966.  Anyone with known risk factors for hepatitis C. Sexually transmitted infections (STIs)  You should be screened each year for STIs, including gonorrhea and chlamydia, if: ? You are sexually active and are younger than 51 years of age. ? You are older than 51 years of age and your health care provider tells you that you are at risk for this type of infection. ? Your sexual activity has changed since you were last screened, and you are at increased risk for chlamydia or gonorrhea. Ask your health care provider if you are at risk.  Ask your health care provider about whether you are at high risk for HIV. Your health care provider may recommend a prescription medicine to help prevent HIV infection. If you choose to take medicine to prevent HIV, you should first get tested for HIV. You should then be tested every 3 months for as long as you are taking the medicine. Follow these instructions at home: Lifestyle  Do not use any products that contain nicotine or tobacco, such as cigarettes, e-cigarettes, and chewing tobacco. If you need help quitting, ask your health care provider.  Do not use street drugs.  Do not share needles.  Ask your health care provider for help if you need support or information about quitting drugs. Alcohol use  Do not drink alcohol if your health care provider tells you not to drink.  If you drink alcohol: ? Limit how much you have to 0-2 drinks a day. ? Be aware of how much alcohol is in your drink. In the [place]  one drink equals one 12 oz bottle of beer (355 mL), one 5 oz glass of wine (148 mL), or one 1 oz glass of hard liquor (44 mL). General instructions  Schedule regular health, dental, and eye exams.  Stay current with your vaccines.  Tell your health care provider if: ? You often feel depressed. ? You have ever been abused or do not feel safe at home. Summary  Adopting a healthy lifestyle and getting preventive care are important in promoting health and wellness.  Follow your health care provider's instructions about healthy diet, exercising, and getting tested or screened for diseases.  Follow your health care provider's instructions on monitoring your cholesterol and blood pressure. This information is not intended to replace advice given to you by your health care provider. Make sure you discuss any questions you have with your health care provider. Document Revised: 01/05/2018 Document Reviewed: 01/05/2018 Elsevier Patient Education  2021 Elsevier Inc.   Steps to Quit Smoking Smoking tobacco is the leading cause of preventable death. It can affect almost every organ in the body. Smoking puts you and people around you at risk for many serious, long-lasting (chronic) diseases. Quitting smoking can be hard, but it is one of the best things that you can do for your health. It is never too late to quit. How do I get ready to quit? When you decide to quit smoking, make a plan to help you succeed. Before you quit:  Pick a date to quit. Set a date within the next 2 weeks to give you time to prepare.  Write  down the reasons why you are quitting. Keep this list in places where you will see it often.  Tell your family, friends, and co-workers that you are quitting. Their support is important.  Talk with your doctor about the choices that may help you quit.  Find out if your health insurance will pay for these treatments.  Know the people, places, things, and activities that make you  want to smoke (triggers). Avoid them. What first steps can I take to quit smoking?  Throw away all cigarettes at home, at work, and in your car.  Throw away the things that you use when you smoke, such as ashtrays and lighters.  Clean your car. Make sure to empty the ashtray.  Clean your home, including curtains and carpets. What can I do to help me quit smoking? Talk with your doctor about taking medicines and seeing a counselor at the same time. You are more likely to succeed when you do both.  If you are pregnant or breastfeeding, talk with your doctor about counseling or other ways to quit smoking. Do not take medicine to help you quit smoking unless your doctor tells you to do so. To quit smoking: Quit right away  Quit smoking totally, instead of slowly cutting back on how much you smoke over a period of time.  Go to counseling. You are more likely to quit if you go to counseling sessions regularly. Take medicine You may take medicines to help you quit. Some medicines need a prescription, and some you can buy over-the-counter. Some medicines may contain a drug called nicotine to replace the nicotine in cigarettes. Medicines may:  Help you to stop having the desire to smoke (cravings).  Help to stop the problems that come when you stop smoking (withdrawal symptoms). Your doctor may ask you to use:  Nicotine patches, gum, or lozenges.  Nicotine inhalers or sprays.  Non-nicotine medicine that is taken by mouth. Find resources Find resources and other ways to help you quit smoking and remain smoke-free after you quit. These resources are most helpful when you use them often. They include:  Online chats with a Veterinary surgeon.  Phone quitlines.  Printed Materials engineer.  Support groups or group counseling.  Text messaging programs.  Mobile phone apps. Use apps on your mobile phone or tablet that can help you stick to your quit plan. There are many free apps for mobile  phones and tablets as well as websites. Examples include Quit Guide from the [employer] and smokefree.gov   What things can I do to make it easier to quit?  Talk to your family and friends. Ask them to support and encourage you.  Call a phone quitline (1-800-QUIT-NOW), reach out to support groups, or work with a Veterinary surgeon.  Ask people who smoke to not smoke around you.  Avoid places that make you want to smoke, such as: ? Bars. ? Parties. ? Smoke-break areas at work.  Spend time with people who do not smoke.  Lower the stress in your life. Stress can make you want to smoke. Try these things to help your stress: ? Getting regular exercise. ? Doing deep-breathing exercises. ? Doing yoga. ? Meditating. ? Doing a body scan. To do this, close your eyes, focus on one area of your body at a time from head to toe. Notice which parts of your body are tense. Try to relax the muscles in those areas.   How will I feel when I quit smoking? Day 1 to  3 weeks Within the first 24 hours, you may start to have some problems that come from quitting tobacco. These problems are very bad 2-3 days after you quit, but they do not often last for more than 2-3 weeks. You may get these symptoms:  Mood swings.  Feeling restless, nervous, angry, or annoyed.  Trouble concentrating.  Dizziness.  Strong desire for high-sugar foods and nicotine.  Weight gain.  Trouble pooping (constipation).  Feeling like you may vomit (nausea).  Coughing or a sore throat.  Changes in how the medicines that you take for other issues work in your body.  Depression.  Trouble sleeping (insomnia). Week 3 and afterward After the first 2-3 weeks of quitting, you may start to notice more positive results, such as:  Better sense of smell and taste.  Less coughing and sore throat.  Slower heart rate.  Lower blood pressure.  Clearer skin.  Better breathing.  Fewer sick days. Quitting smoking can be hard. Do not give up  if you fail the first time. Some people need to try a few times before they succeed. Do your best to stick to your quit plan, and talk with your doctor if you have any questions or concerns. Summary  Smoking tobacco is the leading cause of preventable death. Quitting smoking can be hard, but it is one of the best things that you can do for your health.  When you decide to quit smoking, make a plan to help you succeed.  Quit smoking right away, not slowly over a period of time.  When you start quitting, seek help from your doctor, family, or friends. This information is not intended to replace advice given to you by your health care provider. Make sure you discuss any questions you have with your health care provider. Document Revised: 10/07/2018 Document Reviewed: 04/02/2018 Elsevier Patient Education  2021 [employer].

## 2020-06-17 NOTE — Progress Notes (Addendum)
MEDICARE ANNUAL WELLNESS VISIT  06/17/2020  Telephone Visit Disclaimer This Medicare AWV was conducted by telephone due to national recommendations for restrictions regarding the COVID-19 Pandemic (e.g. social distancing).  I verified, using two identifiers, that I am speaking with [name] or their authorized healthcare agent. I discussed the limitations, risks, security, and privacy concerns of performing an evaluation and management service by telephone and the potential availability of an in-person appointment in the future. The patient expressed understanding and agreed to proceed.  Location of Patient: Home Location of Provider (nurse):  In the office.  Subjective:    [name] is a 50 y.o. male patient of Andrew Nielsen, DO who had a Medicare Annual Wellness Visit today via telephone. [name] is Legally disabled and lives with their family. he has 4 children. he reports that he is socially active and does interact with friends/family regularly. he is minimally physically active and enjoys fishing and camping.  Patient Care Team: Andrew Nielsen, DO as PCP - General (Osteopathic Medicine) Amelia Jo MD (Rheumatology)  Advanced Directives 06/17/2020 11/24/2017 01/06/2017 03/06/2016 10/31/2015 11/12/2014  Does Patient Have a Medical Advance Directive? No No No - No No  Would patient like information on creating a medical advance directive? No - Patient declined No - Patient declined No - Patient declined Yes (MAU/Ambulatory/Procedural Areas - Information given) Yes - Educational materials given -    Hospital Utilization Over the Past 12 Months: # of hospitalizations or ER visits: 0 # of surgeries: 0  Review of Systems    Patient reports that his overall health is worse compared to last year.  History obtained from chart review and the patient  Patient Reported Readings (BP, Pulse, CBG, Weight, etc) none  Pain Assessment Pain : 0-10 Pain Score: 7  Pain  Type: Acute pain Pain Location: Other (Comment) (Tooth (had dental work done prior to this appt).) Pain Onset: Today Pain Frequency: Intermittent Pain Relieving Factors: Medication  Pain Relieving Factors: Medication  Current Medications & Allergies (verified) Allergies as of 06/17/2020      Reactions   Lisinopril Cough   Benadryl [diphenhydramine Hcl] Anxiety      Medication List       Accurate as of Jun 17, 2020 11:22 AM. If you have any questions, ask your nurse or doctor.        albuterol 108 (90 Base) MCG/ACT inhaler Commonly known as: VENTOLIN HFA Inhale 2 puffs into the lungs every 6 (six) hours as needed for wheezing.   amLODipine 10 MG tablet Commonly known as: NORVASC One tablet by mouth every day for blood pressure control.   atorvastatin 40 MG tablet Commonly known as: LIPITOR Take 1 tablet (40 mg total) by mouth daily.   Breo Ellipta 200-25 MCG/INH Aepb Generic drug: fluticasone furoate-vilanterol Inhale 1 puff into the lungs daily.   cyclobenzaprine 10 MG tablet Commonly known as: FLEXERIL Take 1 tablet (10 mg total) by mouth 3 (three) times daily as needed for muscle spasms.   escitalopram 10 MG tablet Commonly known as: LEXAPRO Take 1 tablet (10 mg total) by mouth daily.   loratadine 10 MG tablet Commonly known as: CLARITIN Take 1 tablet (10 mg total) by mouth daily.   methotrexate 2.5 MG tablet Commonly known as: RHEUMATREX Take 20 mg by mouth every Tuesday. Caution:Chemotherapy. Protect from light.   Every other tuesday   omeprazole 40 MG capsule Commonly known as: PRILOSEC Take 1 capsule (40 mg total) by mouth daily.  oxyCODONE-acetaminophen 7.5-325 MG tablet Commonly known as: PERCOCET Take 1 tablet by mouth 3 (three) times daily as needed.   pregabalin 50 MG capsule Commonly known as: Lyrica Take 1 capsule (50 mg total) by mouth daily.   pregabalin 75 MG capsule Commonly known as: LYRICA Take 75 mg by mouth 2 (two) times  daily.   promethazine 25 MG tablet Commonly known as: PHENERGAN Take 1 tablet (25 mg total) by mouth every 6 (six) hours as needed for nausea or vomiting.   SUMAtriptan 100 MG tablet Commonly known as: IMITREX Take 0.5 tablets (50 mg total) by mouth every 2 (two) hours as needed for migraine. May repeat in 2 hours if headache persists or recurs.       History (reviewed): Past Medical History:  Diagnosis Date  . Anxiety   . Arthritis   . Asthma    history [id number]  . Depression   . Essential hypertension 12/11/2011  . Gout   . Headache   . History of hiatal hernia   . History of lithotripsy   . Hypertension   . Kidney stones   . Lumbar degenerative disc disease 12/11/2011  . Nephrolithiasis 12/11/2011  . Pericarditis   . Renal tubular acidosis   . Rheumatoid arthritis(714.0)    Past Surgical History:  Procedure Laterality Date  . ANTERIOR CERVICAL DECOMP/DISCECTOMY FUSION    . ANTERIOR CERVICAL DECOMP/DISCECTOMY FUSION N/A 11/25/2017   Procedure: Anterior Cervical Discectomy Fusion - Cervical Six-Cervical Seven, removal Cervical Five-Six plate;  Surgeon: Tia Alert, MD;  Location: [facility] OR;  Service: Neurosurgery;  Laterality: N/A;  Anterior Cervical Discectomy Fusion - Cervical Six-Cervical Seven, removal Cervical Five-Six plate  . CARPAL TUNNEL RELEASE Left 11/06/2015   Procedure: LEFT WRIST CARPAL TUNNEL RELEASE;  Surgeon: Bradly Bienenstock, MD;  Location: [facility] OR;  Service: Orthopedics;  Laterality: Left;  . HEMORROIDECTOMY    . KIDNEY STONE SURGERY    . WRIST FUSION Left 11/06/2015   Procedure: ARTHRODESIS LEFT WRIST;  Surgeon: Bradly Bienenstock, MD;  Location: [facility] OR;  Service: Orthopedics;  Laterality: Left;   Family History  Problem Relation Age of Onset  . Hypertension Father   . Alcohol abuse Father   . Heart attack Father 56  . Diabetes Mellitus II Father   . Coronary artery disease Father   . Diabetes Mellitus II Mother        Micah Flesher into a diabetic coma, per  pt.  . Cancer - Colon Mother   . Coronary artery disease Mother   . Heart attack Mother 40  . Ovarian cancer Mother   . Uterine cancer Mother   . Heart attack Brother   . Coronary artery disease Brother   . Heart murmur Sister   . Dementia Neg Hx   . Bipolar disorder Neg Hx   . Depression Neg Hx   . Drug abuse Neg Hx   . OCD Neg Hx    Social History   Socioeconomic History  . Marital status: Married    Spouse name: Darl Pikes  . Number of children: 4  . Years of education: 9th grade  . Highest education level: 9th grade  Occupational History    Comment: Legally disabled  Tobacco Use  . Smoking status: Current Every Day Smoker    Packs/day: 1.00    Years: 30.00    Pack years: 30.00    Types: Cigarettes  . Smokeless tobacco: Former Neurosurgeon    Types: Chew    Quit date: 75  . Tobacco  comment: Rx written for Chantix from Dr. Lyn Hollingshead.  Vaping Use  . Vaping Use: Never used  Substance and Sexual Activity  . Alcohol use: No  . Drug use: No  . Sexual activity: Yes    Partners: Female  Other Topics Concern  . Not on file  Social History Narrative   Lives with his family. He walks daily, four times a day with his dog. He enjoys fishing and camping.   Social Determinants of Health   Financial Resource Strain: Low Risk   . Difficulty of Paying Living Expenses: Not hard at all  Food Insecurity: No Food Insecurity  . Worried About Programme researcher, broadcasting/film/video in the Last Year: Never true  . Ran Out of Food in the Last Year: Never true  Transportation Needs: No Transportation Needs  . Lack of Transportation (Medical): No  . Lack of Transportation (Non-Medical): No  Physical Activity: Inactive  . Days of Exercise per Week: 0 days  . Minutes of Exercise per Session: 0 min  Stress: No Stress Concern Present  . Feeling of Stress : Not at all  Social Connections: Moderately Isolated  . Frequency of Communication with Friends and Family: More than three times a week  . Frequency of Social  Gatherings with Friends and Family: More than three times a week  . Attends Religious Services: Never  . Active Member of Clubs or Organizations: No  . Attends Banker Meetings: Never  . Marital Status: Married    Activities of Daily Living In your present state of health, do you have any difficulty performing the following activities: 06/17/2020  Hearing? N  Vision? N  Difficulty concentrating or making decisions? N  Walking or climbing stairs? Y  Comment sometimes due to his arthritis in his knees.  Dressing or bathing? Y  Comment when his arthritis flares up.  Doing errands, shopping? N  Preparing Food and eating ? N  Using the Toilet? N  In the past six months, have you accidently leaked urine? N  Do you have problems with loss of bowel control? N  Managing your Medications? N  Managing your Finances? N  Housekeeping or managing your Housekeeping? N  Some recent data might be hidden    Patient Education/ Literacy How often do you need to have someone help you when you read instructions, pamphlets, or other written materials from your doctor or pharmacy?: 3 - Sometimes What is the last grade level you completed in school?: 9th grade  Exercise Current Exercise Habits: Home exercise routine, Type of exercise: walking, Time (Minutes): 60, Frequency (Times/Week): >7, Weekly Exercise (Minutes/Week): 0, Intensity: Mild, Exercise limited by: orthopedic condition(s)  Diet Patient reports consuming 2-3 meals a day and 3-4 snack(s) a day Patient reports that his primary diet is: Regular Patient reports that she does have regular access to food.   Depression Screen PHQ 2/9 Scores 06/17/2020 02/03/2018 07/28/2017 03/06/2016 02/04/2016  PHQ - 2 Score 0 0 0 0 1  PHQ- 9 Score - - 0 6 3  Some encounter information is confidential and restricted. Go to Review Flowsheets activity to see all data.     Fall Risk Fall Risk  06/17/2020 03/06/2016  Falls in the past year? 0 No  Number  falls in past yr: 0 -  Injury with Fall? 0 -  Risk for fall due to : No Fall Risks -  Follow up Falls evaluation completed -     Objective:  [name] seemed alert  and oriented and he participated appropriately during our telephone visit.  Blood Pressure Weight BMI  BP Readings from Last 3 Encounters:  02/26/20 (!) 160/102  07/20/19 134/89  07/12/19 (!) 180/106   Wt Readings from Last 3 Encounters:  02/26/20 239 lb 1.9 oz (108.5 kg)  07/20/19 233 lb (105.7 kg)  07/12/19 235 lb (106.6 kg)   BMI Readings from Last 1 Encounters:  02/26/20 37.45 kg/m    *Unable to obtain current vital signs, weight, and BMI due to telephone visit type  Hearing/Vision  . Meko did not seem to have difficulty with hearing/understanding during the telephone conversation . Reports that he has had a formal eye exam by an eye care professional within the past year . Reports that he has not had a formal hearing evaluation within the past year *Unable to fully assess hearing and vision during telephone visit type  Cognitive Function: 6CIT Screen 06/17/2020 03/06/2016  What Year? 0 points 0 points  What month? 0 points 0 points  What time? 0 points 0 points  Count back from 20 0 points 0 points  Months in reverse 0 points 0 points  Repeat phrase 0 points 0 points  Total Score 0 0   (Normal:0-7, Significant for Dysfunction: >8)  Normal Cognitive Function Screening: Yes   Immunization & Health Maintenance Record Immunization History  Administered Date(s) Administered  . Influenza, Seasonal, Injecte, Preservative Fre 02/12/2012  . Influenza,inj,Quad PF,6+ Mos 10/02/2014, 09/25/2015, 12/30/2016, 01/27/2018  . Moderna Sars-Covid-2 Vaccination 09/11/2019, 10/09/2019  . PPD Test 05/14/2014  . Tdap 11/12/2014  . Zoster Recombinat (Shingrix) 06/16/2020    Health Maintenance  Topic Date Due  . HIV Screening  Never done  . Hepatitis C Screening  Never done  . COVID-19 Vaccine (3 - Booster for  Moderna series) 03/10/2020  . COLONOSCOPY (Pts 45-13yrs Insurance coverage will need to be confirmed)  11/06/2020 (Originally 05/01/2014)  . INFLUENZA VACCINE  08/26/2020  . TETANUS/TDAP  11/11/2024  . HPV VACCINES  Aged Out       Assessment  This is a routine wellness examination for [name].  Health Maintenance: Due or Overdue Health Maintenance Due  Topic Date Due  . HIV Screening  Never done  . Hepatitis C Screening  Never done  . COVID-19 Vaccine (3 - Booster for Moderna series) 03/10/2020    [name] does not need a referral for Community Assistance: Care Management:   no Social Work:    no Prescription Assistance:  no Nutrition/Diabetes Education:  no   Plan:  Personalized Goals Goals Addressed              This Visit's Progress   .  Patient Stated (pt-stated)        06/17/2020 AWV Goal: Tobacco Cessation  Smoking cessation instruction/counseling given:  counseled patient on the dangers of tobacco use, advised patient to stop smoking, and reviewed strategies to maximize success   Patient will verbalize understanding of the health risks associated with smoking/tobacco use  Lung cancer or lung disease, such as COPD  Heart disease.  Stroke.  Heart attack  Infertility  Osteoporosis and bone fractures. . Patient will create a plan to quit smoking/using tobacco  Pick a date to quit.   Write down the reasons why you are quitting and put it where you will see it often.  Identify the people, places, things, and activities that make you want to smoke (triggers) and avoid them. Make sure to take these actions: ?  Throw away all cigarettes at home, at work, and in your car. ? Throw away smoking accessories, such as Set designer. ? Clean your car and make sure to empty the ashtray. ? Clean your home, including curtains and carpets.  Tell your family, friends, and coworkers that you are quitting. Support from your loved ones can make  quitting easier.  Talk with your health care provider about your options for quitting smoking.  Find out what treatment options are covered by your health insurance. . Patient will be able to demonstrate knowledge of tobacco cessation strategies that may maximize success  Quitting "cold [place]" is more successful than gradually quitting.  Attending in-person counseling to help you build problem-solving skills.   Finding resources and support systems that can help you to quit smoking and remain smoke-free after you quit. These resources are most helpful when you use them often. They can include: ? Online chats with a Veterinary surgeon. ? Telephone quitlines. ? Automotive engineer. ? Support groups or group counseling. ? Text messaging programs. ? Mobile phone applications.  Taking medicines to help you quit smoking: ? Nicotine patches, gum, or lozenges. ? Nicotine inhalers or sprays. ? Non-nicotine medicine that is taken by mouth. . Patient will note get discouraged if the process is difficult . Over the next year, patient will stop smoking or using other forms of tobacco  Smoking cessation instruction/counseling given:  counseled patient on the dangers of tobacco use, advised patient to stop smoking, and reviewed strategies to maximize success        Personalized Health Maintenance & Screening Recommendations  Shingrix (2nd dose)  Lung Cancer Screening Recommended: no (Low Dose CT Chest recommended if Age 28-80 years, 30 pack-year currently smoking OR have quit w/in past 15 years) Hepatitis C Screening recommended: yes HIV Screening recommended: no  Advanced Directives: Written information was not prepared per patient's request.  Referrals & Orders Orders Placed This Encounter  Procedures  . Ambulatory referral to Gastroenterology (for Colonoscopy)    Follow-up Plan . Follow-up with Andrew Nielsen, DO as planned . Schedule your next shingles vaccine.  . Referral  for your colonoscopy has been sent.  . Medicare wellness visit in one year.   I have personally reviewed and noted the following in the patient's chart:   . Medical and social history . Use of alcohol, tobacco or illicit drugs  . Current medications and supplements . Functional ability and status . Nutritional status . Physical activity . Advanced directives . List of other physicians . Hospitalizations, surgeries, and ER visits in previous 12 months . Vitals . Screenings to include cognitive, depression, and falls . Referrals and appointments  In addition, I have reviewed and discussed with [name] certain preventive protocols, quality metrics, and best practice recommendations. A written personalized care plan for preventive services as well as general preventive health recommendations is available and can be mailed to the patient at his request.      Modesto Charon  06/17/2020    .bk

## 2020-06-18 ENCOUNTER — Telehealth: Payer: Self-pay | Admitting: Osteopathic Medicine

## 2020-06-18 NOTE — Progress Notes (Signed)
  Chronic Care Management   Outreach Note  06/18/2020 Name: [name] MRN: [id number] DOB: 02/24/1970  Referred by: Sunnie Nielsen, DO Reason for referral : No chief complaint on file.   An unsuccessful telephone outreach was attempted today. The patient was referred to the pharmacist for assistance with care management and care coordination.   Follow Up Plan:   Carmell [place] Upstream Scheduler

## 2020-07-10 ENCOUNTER — Telehealth (HOSPITAL_COMMUNITY): Payer: Medicare Other | Admitting: Psychiatry

## 2020-07-23 ENCOUNTER — Telehealth: Payer: Self-pay | Admitting: Osteopathic Medicine

## 2020-07-23 NOTE — Progress Notes (Signed)
  Chronic Care Management   Outreach Note  07/23/2020 Name: [name] MRN: [id number] DOB: 10/13/1970  Referred by: Sunnie Nielsen, DO Reason for referral : No chief complaint on file.   Third unsuccessful telephone outreach was attempted today. The patient was referred to the pharmacist for assistance with care management and care coordination.   Follow Up Plan:   Carmell [place] Upstream Scheduler

## 2020-08-27 ENCOUNTER — Encounter: Payer: Medicare Other | Admitting: Osteopathic Medicine

## 2020-10-09 ENCOUNTER — Encounter (HOSPITAL_COMMUNITY): Payer: Self-pay | Admitting: Psychiatry

## 2020-10-09 ENCOUNTER — Telehealth (INDEPENDENT_AMBULATORY_CARE_PROVIDER_SITE_OTHER): Payer: Medicare Other | Admitting: Psychiatry

## 2020-10-09 DIAGNOSIS — F411 Generalized anxiety disorder: Secondary | ICD-10-CM | POA: Diagnosis not present

## 2020-10-09 DIAGNOSIS — F331 Major depressive disorder, recurrent, moderate: Secondary | ICD-10-CM | POA: Diagnosis not present

## 2020-10-09 MED ORDER — ESCITALOPRAM OXALATE 10 MG PO TABS: 10.0000 mg | ORAL_TABLET | Freq: Every day | ORAL | 2 refills | Status: DC

## 2020-10-09 NOTE — Progress Notes (Signed)
Patient ID: [name], male   DOB: 12-16-1970, 50 y.o.   MRN: [id number] [facility] Follow-up Outpatient Visit  [name] [id number] 50 y.o.  10/09/2020  Virtual Visit via Telephone Note  I connected with [name] on 10/09/20 at  2:15 PM EDT by telephone and verified that I am speaking with the correct person using two identifiers.  Location: Patient: home Provider: home office   I discussed the limitations, risks, security and privacy concerns of performing an evaluation and management service by telephone and the availability of in person appointments. I also discussed with the patient that there may be a patient responsible charge related to this service. The patient expressed understanding and agreed to proceed.      I discussed the assessment and treatment plan with the patient. The patient was provided an opportunity to ask questions and all were answered. The patient agreed with the plan and demonstrated an understanding of the instructions.   The patient was advised to call back or seek an in-person evaluation if the symptoms worsen or if the condition fails to improve as anticipated.  I provided 12 minutes of non-face-to-face time during this encounter.   Chief Complaint: follow up for depression and anxiety.   History of Present Illness:  Patient remained stable baseline on Lexapro does not report having side effects good communication with the wife try to keep himself busy with the granddaughter   Gets pain managed by percocet follows with pain clinic Modifying factor; wife) care Severity beter      Past Medical History:  Diagnosis Date   Anxiety    Arthritis    Asthma    history [id number]   Depression    Essential hypertension 12/11/2011   Gout    Headache    History of hiatal hernia    History of lithotripsy    Hypertension    Kidney stones    Lumbar degenerative disc disease 12/11/2011   Nephrolithiasis 12/11/2011    Pericarditis    Renal tubular acidosis    Rheumatoid arthritis(714.0)    Family History  Problem Relation Age of Onset   Hypertension Father    Alcohol abuse Father    Heart attack Father 67   Diabetes Mellitus II Father    Coronary artery disease Father    Diabetes Mellitus II Mother        [name] into a diabetic coma, per pt.   Cancer - Colon Mother    Coronary artery disease Mother    Heart attack Mother 73   Ovarian cancer Mother    Uterine cancer Mother    Heart attack Brother    Coronary artery disease Brother    Heart murmur Sister    Dementia Neg Hx    Bipolar disorder Neg Hx    Depression Neg Hx    Drug abuse Neg Hx    OCD Neg Hx     Outpatient Encounter Medications as of 10/09/2020  Medication Sig   albuterol (VENTOLIN HFA) 108 (90 Base) MCG/ACT inhaler Inhale 2 puffs into the lungs every 6 (six) hours as needed for wheezing.   amLODipine (NORVASC) 10 MG tablet One tablet by mouth every day for blood pressure control.   atorvastatin (LIPITOR) 40 MG tablet Take 1 tablet (40 mg total) by mouth daily.   cyclobenzaprine (FLEXERIL) 10 MG tablet Take 1 tablet (10 mg total) by mouth 3 (three) times daily as needed for muscle spasms.   escitalopram (LEXAPRO) 10 MG  tablet Take 1 tablet (10 mg total) by mouth daily.   fluticasone furoate-vilanterol (BREO ELLIPTA) 200-25 MCG/INH AEPB Inhale 1 puff into the lungs daily.   loratadine (CLARITIN) 10 MG tablet Take 1 tablet (10 mg total) by mouth daily.   methotrexate (RHEUMATREX) 2.5 MG tablet Take 20 mg by mouth every Tuesday. Caution:Chemotherapy. Protect from light.   Every other tuesday   omeprazole (PRILOSEC) 40 MG capsule Take 1 capsule (40 mg total) by mouth daily.   oxyCODONE-acetaminophen (PERCOCET) 7.5-325 MG tablet Take 1 tablet by mouth 3 (three) times daily as needed.   pregabalin (LYRICA) 50 MG capsule Take 1 capsule (50 mg total) by mouth daily. (Patient not taking: Reported on 06/17/2020)   pregabalin (LYRICA) 75 MG  capsule Take 75 mg by mouth 2 (two) times daily.   promethazine (PHENERGAN) 25 MG tablet Take 1 tablet (25 mg total) by mouth every 6 (six) hours as needed for nausea or vomiting.   SUMAtriptan (IMITREX) 100 MG tablet Take 0.5 tablets (50 mg total) by mouth every 2 (two) hours as needed for migraine. May repeat in 2 hours if headache persists or recurs.   [DISCONTINUED] escitalopram (LEXAPRO) 10 MG tablet Take 1 tablet (10 mg total) by mouth daily.   [DISCONTINUED] FLUoxetine (PROZAC) 40 MG capsule Take 1 capsule (40 mg total) by mouth 2 (two) times daily.   [DISCONTINUED] traZODone (DESYREL) 100 MG tablet Take 1 tablet (100 mg total) by mouth at bedtime as needed for sleep.   No facility-administered encounter medications on file as of 10/09/2020.    No results found for this or any previous visit (from the past 2160 hour(s)).  There were no vitals taken for this visit.   Review of Systems  Cardiovascular:  Negative for chest pain.  Skin:  Negative for itching.  Psychiatric/Behavioral:  Negative for depression and suicidal ideas.    Mental Status Examination  Appearance: Alert: Yes Attention: fair  Cooperative: Yes Eye Contact: Speech: coherent Psychomotor Activity: Normal Memory/Concentration: adequate Oriented: person, place, time/date and situation Mood: fair Affect: Thought Processes and Associations: Coherent Fund of Knowledge: Fair Thought Content: Suicidal ideation and Homicidal ideation were denied. Denies hallucinations Insight: Fair Judgement: Fair  Diagnosis: Major depressive disorder recurrent moderate to severe. Generalized anxiety disorder. Insomnia. 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 Not worse  Anxiety : Manageable on Lexapro we will continue Follow-up 6 months in office   Thresa Ross, MD

## 2021-01-15 ENCOUNTER — Other Ambulatory Visit: Payer: Self-pay

## 2021-01-15 ENCOUNTER — Ambulatory Visit (INDEPENDENT_AMBULATORY_CARE_PROVIDER_SITE_OTHER): Payer: Medicare Other | Admitting: Physician Assistant

## 2021-01-15 ENCOUNTER — Encounter: Payer: Self-pay | Admitting: Physician Assistant

## 2021-01-15 VITALS — BP 162/93 | HR 79 | Resp 12 | Ht 67.0 in | Wt 240.0 lb

## 2021-01-15 DIAGNOSIS — Z125 Encounter for screening for malignant neoplasm of prostate: Secondary | ICD-10-CM

## 2021-01-15 DIAGNOSIS — R0681 Apnea, not elsewhere classified: Secondary | ICD-10-CM | POA: Insufficient documentation

## 2021-01-15 DIAGNOSIS — Z6837 Body mass index (BMI) 37.0-37.9, adult: Secondary | ICD-10-CM

## 2021-01-15 DIAGNOSIS — I10 Essential (primary) hypertension: Secondary | ICD-10-CM

## 2021-01-15 DIAGNOSIS — Z23 Encounter for immunization: Secondary | ICD-10-CM | POA: Diagnosis not present

## 2021-01-15 DIAGNOSIS — G894 Chronic pain syndrome: Secondary | ICD-10-CM

## 2021-01-15 DIAGNOSIS — R0683 Snoring: Secondary | ICD-10-CM

## 2021-01-15 DIAGNOSIS — G478 Other sleep disorders: Secondary | ICD-10-CM

## 2021-01-15 DIAGNOSIS — G43809 Other migraine, not intractable, without status migrainosus: Secondary | ICD-10-CM

## 2021-01-15 DIAGNOSIS — E785 Hyperlipidemia, unspecified: Secondary | ICD-10-CM | POA: Insufficient documentation

## 2021-01-15 DIAGNOSIS — Z8669 Personal history of other diseases of the nervous system and sense organs: Secondary | ICD-10-CM

## 2021-01-15 DIAGNOSIS — R5383 Other fatigue: Secondary | ICD-10-CM

## 2021-01-15 DIAGNOSIS — Z79899 Other long term (current) drug therapy: Secondary | ICD-10-CM

## 2021-01-15 DIAGNOSIS — Z1211 Encounter for screening for malignant neoplasm of colon: Secondary | ICD-10-CM

## 2021-01-15 MED ORDER — OMEPRAZOLE 40 MG PO CPDR: 40.0000 mg | DELAYED_RELEASE_CAPSULE | Freq: Every day | ORAL | 3 refills | Status: DC

## 2021-01-15 MED ORDER — AMLODIPINE BESYLATE 10 MG PO TABS: ORAL_TABLET | ORAL | 3 refills | Status: DC

## 2021-01-15 MED ORDER — ATORVASTATIN CALCIUM 40 MG PO TABS: 40.0000 mg | ORAL_TABLET | Freq: Every day | ORAL | 3 refills | Status: DC

## 2021-01-15 MED ORDER — CYCLOBENZAPRINE HCL 10 MG PO TABS: 10.0000 mg | ORAL_TABLET | Freq: Three times a day (TID) | ORAL | 5 refills | Status: DC | PRN

## 2021-01-15 MED ORDER — PREGABALIN 75 MG PO CAPS: 75.0000 mg | ORAL_CAPSULE | Freq: Two times a day (BID) | ORAL | 1 refills | Status: DC

## 2021-01-15 MED ORDER — SUMATRIPTAN SUCCINATE 100 MG PO TABS: 50.0000 mg | ORAL_TABLET | ORAL | 5 refills | Status: DC | PRN

## 2021-01-15 NOTE — Progress Notes (Signed)
Subjective:    Patient ID: [name], male    DOB: [birth year], 50 y.o.   MRN: [id number]  HPI Pt is a 50 yo obese male with HTN, HLD, Migraines, GERD who presents to the clinic for refills.   His PcP left and he is set to establish care with [name] in January.   Pt has been out of medication for at least 2 weeks. No CP or palpitations but has had a headache and multiple migraines. He is constantly tired and reports apenic events at night. Not had a sleep study in a few years. Tested negative with last sleep test but he is not sure he slept that night.   .. Active Ambulatory Problems    Diagnosis Date Noted   Essential hypertension 12/11/2011   Lumbar degenerative disc disease 12/11/2011   Nephrolithiasis 12/11/2011   Chronic pain of multiple joints 12/11/2011   Right shoulder pain 12/11/2011   Left shoulder pain 12/11/2011   Rheumatoid arthritis ([facility]) 12/17/2011   Hyperlipidemia LDL goal <130 12/21/2011   Degenerative disc disease, cervical 12/28/2011   Restless leg 02/12/2012   Tobacco use 03/04/2012   Hypercalcemia 03/16/2012   Infected sebaceous cyst of skin 04/19/2012   Major depressive disorder, single episode, severe, without mention of psychotic behavior 08/31/2012   Hyperglycemia 11/02/2012   Tinnitus 11/02/2012   Rectal bleeding 05/11/2013   Left hand pain 08/27/2015   Arthritis of left wrist 11/06/2015   History of obstructive sleep apnea 01/17/2016   Migraine 03/08/2016   Cervical vertebral fusion 11/25/2017   Witnessed episode of apnea 01/15/2021   Resolved Ambulatory Problems    Diagnosis Date Noted   Anxiety 12/18/2011   Adjustment disorder with mixed anxiety and depressed mood 06/02/2012   Past Medical History:  Diagnosis Date   Arthritis    Asthma    Depression    Gout    Headache    History of hiatal hernia    History of lithotripsy    Hypertension    Kidney stones    Pericarditis    Renal tubular acidosis    Rheumatoid arthritis(714.0)       Review of Systems  All other systems reviewed and are negative.     Objective:   Physical Exam Vitals reviewed.  Constitutional:      Appearance: Normal appearance. He is obese.  HENT:     Head: Normocephalic.  Neck:     Vascular: No carotid bruit.  Cardiovascular:     Rate and Rhythm: Normal rate and regular rhythm.     Pulses: Normal pulses.     Heart sounds: Normal heart sounds.  Pulmonary:     Effort: Pulmonary effort is normal.     Breath sounds: Normal breath sounds.  Musculoskeletal:     Right lower leg: No edema.     Left lower leg: No edema.  Lymphadenopathy:     Cervical: No cervical adenopathy.  Neurological:     General: No focal deficit present.     Mental Status: He is alert and oriented to person, place, and time.  Psychiatric:        Mood and Affect: Mood normal.   .. Depression screen [facility] 2/9 01/15/2021 06/17/2020 02/03/2018 07/28/2017 03/06/2016  Decreased Interest 0 0 0 0 0  Down, Depressed, Hopeless 0 0 0 0 0  PHQ - 2 Score 0 0 0 0 0  Altered sleeping 0 - - 0 3  Tired, decreased energy 0 - - 0 3  Change in appetite 0 - - 0 0  Feeling bad or failure about yourself  0 - - 0 0  Trouble concentrating 0 - - 0 0  Moving slowly or fidgety/restless 0 - - 0 0  Suicidal thoughts 0 - - 0 0  PHQ-9 Score 0 - - 0 6  Difficult doing work/chores Not difficult at all - - Not difficult at all -  Some encounter information is confidential and restricted. Go to Review Flowsheets activity to see all data.   .. GAD 7 : Generalized Anxiety Score 01/15/2021 02/03/2018 07/28/2017 02/04/2016  Nervous, Anxious, on Edge 0 0 0 0  Control/stop worrying 0 0 0 1  Worry too much - different things 0 0 0 1  Trouble relaxing 0 0 0 0  Restless 0 0 0 1  Easily annoyed or irritable 0 0 0 1  Afraid - awful might happen 0 0 0 1  Total GAD 7 Score 0 0 0 5  Anxiety Difficulty Not difficult at all Not difficult at all Not difficult at all -           Assessment & Plan:  Marland KitchenMarland KitchenBonifacio  was seen today for follow-up.  Diagnoses and all orders for this visit:  Essential hypertension -     COMPLETE METABOLIC PANEL WITH GFR -     amLODipine (NORVASC) 10 MG tablet; One tablet by mouth every day for blood pressure control.  Hyperlipidemia, unspecified hyperlipidemia type -     Lipid Panel w/reflex Direct LDL -     atorvastatin (LIPITOR) 40 MG tablet; Take 1 tablet (40 mg total) by mouth daily.  Screening PSA (prostate specific antigen) -     PSA  Medication management -     PSA -     Lipid Panel w/reflex Direct LDL -     COMPLETE METABOLIC PANEL WITH GFR -     CBC with Differential/Platelet  Witnessed episode of apnea -     Home sleep test  No energy -     Testosterone Total,Free,Bio, Males  Flu vaccine need -     Flu Vaccine QUAD 9mo+IM (Fluarix, Fluzone & Alfiuria Quad PF)  History of obstructive sleep apnea  Colon cancer screening -     Ambulatory referral to Gastroenterology  Hyperlipidemia, unspecified hyperlipidemia type Comments: ASCVD risk greater than 7.5%, initiate statin today Orders: -     Lipid Panel w/reflex Direct LDL -     atorvastatin (LIPITOR) 40 MG tablet; Take 1 tablet (40 mg total) by mouth daily.  Chronic pain syndrome -     cyclobenzaprine (FLEXERIL) 10 MG tablet; Take 1 tablet (10 mg total) by mouth 3 (three) times daily as needed for muscle spasms.  Other migraine without status migrainosus, not intractable -     SUMAtriptan (IMITREX) 100 MG tablet; Take 0.5 tablets (50 mg total) by mouth every 2 (two) hours as needed for migraine. May repeat in 2 hours if headache persists or recurs.  Snoring -     Home sleep test  Non-restorative sleep -     Home sleep test  Class 2 severe obesity due to excess calories with serious comorbidity and body mass index (BMI) of 37.0 to 37.9 in adult [facility]) -     Home sleep test  Other orders -     omeprazole (PRILOSEC) 40 MG capsule; Take 1 capsule (40 mg total) by mouth daily. -      pregabalin (LYRICA) 75 MG capsule; Take 1 capsule (75  mg total) by mouth 2 (two) times daily.   BP very elevated today but has been out of medication.  Restart medication and follow up for recheck at establish care visit with [name] next month.   Concern for apnea. Home sleep test ordered today.   Refilled medications today for pain, migraines, cholesterol, GERd. Needs fasting labs.   Flu shot given today.  Will go to pharmacy for 2nd shingles shot.  Colonoscopy referral placed.

## 2021-01-16 NOTE — Addendum Note (Signed)
Addended bySilvio Pate on: 01/16/2021 10:35 AM   Modules accepted: Orders

## 2021-02-19 ENCOUNTER — Ambulatory Visit (INDEPENDENT_AMBULATORY_CARE_PROVIDER_SITE_OTHER): Payer: Medicare Other | Admitting: Medical-Surgical

## 2021-02-19 ENCOUNTER — Encounter: Payer: Self-pay | Admitting: Medical-Surgical

## 2021-02-19 ENCOUNTER — Other Ambulatory Visit: Payer: Self-pay

## 2021-02-19 VITALS — BP 184/117 | HR 74 | Resp 20 | Ht 67.0 in | Wt 242.0 lb

## 2021-02-19 DIAGNOSIS — G43809 Other migraine, not intractable, without status migrainosus: Secondary | ICD-10-CM

## 2021-02-19 DIAGNOSIS — Z7689 Persons encountering health services in other specified circumstances: Secondary | ICD-10-CM

## 2021-02-19 DIAGNOSIS — E785 Hyperlipidemia, unspecified: Secondary | ICD-10-CM

## 2021-02-19 DIAGNOSIS — R0602 Shortness of breath: Secondary | ICD-10-CM

## 2021-02-19 DIAGNOSIS — M069 Rheumatoid arthritis, unspecified: Secondary | ICD-10-CM

## 2021-02-19 DIAGNOSIS — I10 Essential (primary) hypertension: Secondary | ICD-10-CM

## 2021-02-19 DIAGNOSIS — F322 Major depressive disorder, single episode, severe without psychotic features: Secondary | ICD-10-CM

## 2021-02-19 DIAGNOSIS — Z8669 Personal history of other diseases of the nervous system and sense organs: Secondary | ICD-10-CM

## 2021-02-19 MED ORDER — CHLORTHALIDONE 25 MG PO TABS: 25.0000 mg | ORAL_TABLET | Freq: Every day | ORAL | 3 refills | Status: DC | PRN

## 2021-02-19 MED ORDER — FLUTICASONE FUROATE-VILANTEROL 200-25 MCG/ACT IN AEPB: 1.0000 | INHALATION_SPRAY | Freq: Every day | RESPIRATORY_TRACT | 11 refills | Status: DC

## 2021-02-19 MED ORDER — ALBUTEROL SULFATE HFA 108 (90 BASE) MCG/ACT IN AERS: 2.0000 | INHALATION_SPRAY | Freq: Four times a day (QID) | RESPIRATORY_TRACT | 11 refills | Status: DC | PRN

## 2021-02-19 NOTE — Progress Notes (Signed)
HPI with pertinent ROS:   CC: transfer of care  HPI: Pleasant 51 year old male presenting today to transfer care to a new PCP and for the following:  HTN- taking Amlodipine 10mg  daily, tolerating well overall.  Does endorse some lower extremity edema periodically.  He has rheumatoid arthritis as well but has been able to distinguish his edema and fluid because will get sore to touch.  He does not wear compression stockings or socks.  Does not add salt to his foods and tries to avoid fast foods/processed foods. Took Lasix years ago but has not been prescribed that in around 8 years. Denies CP, SOB, palpitations, dizziness, headaches, or vision changes.  Mood-managed by psychiatry with Lexapro 10 mg daily.  Symptoms stable.  Migraine-continues to have issues with very infrequently.  Taking Imitrex on an as-needed basis, tolerating well without side effects.  Also has Phenergan to use for nausea associated with migraines.    Rheumatoid arthritis-taking methotrexate 20 mg once weekly.  Using Flexeril 10 mg 3 times daily as needed.  Lyrica 75 mg twice daily.  Known history of sleep apnea however it has been a while since he had a sleep test.  He does not use a CPAP and has never obtained 1.  Was seen by Tandy Gaw, PA who ordered a sleep study but has not heard from this.  Using albuterol inhaler as well as Breo Ellipta.  Reports that he uses the albuterol up to 3 times daily but this is not every day.  More frequent use during allergy season.  Uses his Breo Ellipta every night and reports that sometimes he gets choked up on that as he feels like he is inhaling powder.  Continues to smoke cigarettes although he does express a desire to stop.  He has used Chantix in the past without benefit.  Not currently on Wellbutrin and has never tried it.  Has not tried over-the-counter nicotine patches/gum/lozenges.  I reviewed the past medical history, family history, social history, surgical history, and  allergies today and no changes were needed.  Please see the problem list section below in epic for further details.   Physical exam:   General: Well Developed, well nourished, and in no acute distress.  Neuro: Alert and oriented x3.  HEENT: Normocephalic, atraumatic.  Skin: Warm and dry. Cardiac: Regular rate and rhythm, no murmurs rubs or gallops, no lower extremity edema.  Respiratory: Clear to auscultation bilaterally. Not using accessory muscles, speaking in full sentences.  Impression and Recommendations:    1. Encounter to establish care Reviewed available information and discussed care concerns with patient.   2. Hyperlipidemia, unspecified hyperlipidemia type Continue Lipitor 40mg  dailyl.   3. Essential hypertension Continue Amlodipine 10mg  daily as prescribed. BP elevated here but good at his last appointment. Recommend continuing low sodium diet. Consider adding compression socks for lower extremity edema. Adding Chlorthalidone 25mg  daily as needed for swelling and BP greater than 130/80.   4. Other migraine without status migrainosus, not intractable Continue Imitrex and Phenergan as needed.   5. Major depressive disorder, single episode, severe (HCC) Managed by Psychiatry.  6. SOB (shortness of breath) Continue Albuterol as needed. Continue Breo Ellipta daily.  - albuterol (VENTOLIN HFA) 108 (90 Base) MCG/ACT inhaler; Inhale 2 puffs into the lungs every 6 (six) hours as needed for wheezing.  Dispense: 2 each; Refill: 11  7. Rheumatoid arthritis of hand, unspecified laterality, unspecified whether rheumatoid factor present [facility]) Managed by Rheumatology and pain management.   8. History  of obstructive sleep apnea Printed order for home sleep study and placed in the referral coordinator's basket to get updated information. Ideally, we can get him using a CPAP which should help his sleep quality and well as his blood pressure.   Return for labs in 4-6 weeks; HTN follow  up in 3 months. ___________________________________________ Thayer OhmJoy L. Casy Brunetto, DNP, APRN, FNP-BC Primary Care and Sports Medicine [facility]

## 2021-02-24 ENCOUNTER — Other Ambulatory Visit: Payer: Self-pay

## 2021-02-24 DIAGNOSIS — R0683 Snoring: Secondary | ICD-10-CM

## 2021-02-24 DIAGNOSIS — R0681 Apnea, not elsewhere classified: Secondary | ICD-10-CM

## 2021-02-24 DIAGNOSIS — G478 Other sleep disorders: Secondary | ICD-10-CM

## 2021-02-24 DIAGNOSIS — Z8669 Personal history of other diseases of the nervous system and sense organs: Secondary | ICD-10-CM

## 2021-02-24 NOTE — Progress Notes (Signed)
Per Jenny Reichmann - home sleep test needs to be re-ordered. Test entered incorrectly in Epic.

## 2021-03-25 ENCOUNTER — Ambulatory Visit (HOSPITAL_BASED_OUTPATIENT_CLINIC_OR_DEPARTMENT_OTHER): Payer: Medicare Other | Admitting: Internal Medicine

## 2021-03-25 ENCOUNTER — Other Ambulatory Visit: Payer: Self-pay

## 2021-03-25 DIAGNOSIS — Z8669 Personal history of other diseases of the nervous system and sense organs: Secondary | ICD-10-CM

## 2021-03-25 DIAGNOSIS — R0683 Snoring: Secondary | ICD-10-CM

## 2021-03-25 DIAGNOSIS — G478 Other sleep disorders: Secondary | ICD-10-CM

## 2021-03-25 DIAGNOSIS — R0681 Apnea, not elsewhere classified: Secondary | ICD-10-CM

## 2021-04-01 ENCOUNTER — Ambulatory Visit (HOSPITAL_BASED_OUTPATIENT_CLINIC_OR_DEPARTMENT_OTHER): Payer: Medicare Other | Attending: Physician Assistant | Admitting: Internal Medicine

## 2021-04-01 ENCOUNTER — Other Ambulatory Visit: Payer: Self-pay

## 2021-04-03 ENCOUNTER — Telehealth (INDEPENDENT_AMBULATORY_CARE_PROVIDER_SITE_OTHER): Payer: Medicare Other | Admitting: Psychiatry

## 2021-04-03 ENCOUNTER — Other Ambulatory Visit: Payer: Self-pay

## 2021-04-03 ENCOUNTER — Encounter: Payer: Self-pay | Admitting: Medical-Surgical

## 2021-04-03 ENCOUNTER — Ambulatory Visit (INDEPENDENT_AMBULATORY_CARE_PROVIDER_SITE_OTHER): Payer: Medicare Other | Admitting: Medical-Surgical

## 2021-04-03 ENCOUNTER — Encounter (HOSPITAL_COMMUNITY): Payer: Self-pay | Admitting: Psychiatry

## 2021-04-03 VITALS — BP 172/120 | HR 81 | Resp 20 | Ht 68.0 in | Wt 247.8 lb

## 2021-04-03 DIAGNOSIS — F411 Generalized anxiety disorder: Secondary | ICD-10-CM | POA: Diagnosis not present

## 2021-04-03 DIAGNOSIS — F5102 Adjustment insomnia: Secondary | ICD-10-CM | POA: Diagnosis not present

## 2021-04-03 DIAGNOSIS — R5383 Other fatigue: Secondary | ICD-10-CM

## 2021-04-03 DIAGNOSIS — F331 Major depressive disorder, recurrent, moderate: Secondary | ICD-10-CM

## 2021-04-03 DIAGNOSIS — Z125 Encounter for screening for malignant neoplasm of prostate: Secondary | ICD-10-CM

## 2021-04-03 DIAGNOSIS — Z23 Encounter for immunization: Secondary | ICD-10-CM

## 2021-04-03 DIAGNOSIS — E785 Hyperlipidemia, unspecified: Secondary | ICD-10-CM

## 2021-04-03 DIAGNOSIS — Z8669 Personal history of other diseases of the nervous system and sense organs: Secondary | ICD-10-CM

## 2021-04-03 DIAGNOSIS — I10 Essential (primary) hypertension: Secondary | ICD-10-CM

## 2021-04-03 MED ORDER — VALSARTAN-HYDROCHLOROTHIAZIDE 160-25 MG PO TABS: 1.0000 | ORAL_TABLET | Freq: Every day | ORAL | 3 refills | Status: DC

## 2021-04-03 MED ORDER — NICOTINE 21-14-7 MG/24HR TD KIT: 1.0000 | PACK | Freq: Every day | TRANSDERMAL | 0 refills | Status: DC

## 2021-04-03 NOTE — Progress Notes (Signed)
Patient ID: [name], male   DOB: [birth year], 51 y.o.   MRN: [id number] ?[name] ?Follow-up Outpatient Visit ? ?[name] ?[id number] ?51 y.o. ? ?04/03/2021 ? ?Virtual Visit via Video Note ? ?I connected with [name] on 04/03/21 at  1:30 PM EST by a video enabled telemedicine application and verified that I am speaking with the correct person using two identifiers. ? ?Location: ?Patient: home ?Provider: office ?  ?I discussed the limitations of evaluation and management by telemedicine and the availability of in person appointments. The patient expressed understanding and agreed to proceed. ? ? ?  ?I discussed the assessment and treatment plan with the patient. The patient was provided an opportunity to ask questions and all were answered. The patient agreed with the plan and demonstrated an understanding of the instructions. ?  ?The patient was advised to call back or seek an in-person evaluation if the symptoms worsen or if the condition fails to improve as anticipated. ? ?I provided 15 minutes of non-face-to-face time during this encounter. ? ? ? ?Chief Complaint: follow up for depression and anxiety. ? ? ?History of Present Illness:  ?Patient doing fair with meds mood is balanced and tolerating  ?Anxiety panic attacks are controlled ?Follows with pain clinic .  ?Modifying factor; wife,  ?Severity: manageable ? ?Duration more then 10 years  ? ? ? ?Past Medical History:  ?Diagnosis Date  ? Anxiety   ? Arthritis   ? Asthma   ? history [id number]  ? Depression   ? Essential hypertension 12/11/2011  ? Gout   ? Headache   ? History of hiatal hernia   ? History of lithotripsy   ? Hypertension   ? Kidney stones   ? Lumbar degenerative disc disease 12/11/2011  ? Nephrolithiasis 12/11/2011  ? Pericarditis   ? Renal tubular acidosis   ? Rheumatoid arthritis(714.0)   ? ?Family History  ?Problem Relation Age of Onset  ? Hypertension Father   ? Alcohol abuse Father   ? Heart attack Father 49  ?  Diabetes Mellitus II Father   ? Coronary artery disease Father   ? Diabetes Mellitus II Mother   ?     Went into a diabetic coma, per pt.  ? Cancer - Colon Mother   ? Coronary artery disease Mother   ? Heart attack Mother 48  ? Ovarian cancer Mother   ? Uterine cancer Mother   ? Heart attack Brother   ? Coronary artery disease Brother   ? Heart murmur Sister   ? Dementia Neg Hx   ? Bipolar disorder Neg Hx   ? Depression Neg Hx   ? Drug abuse Neg Hx   ? OCD Neg Hx   ? ? ?Outpatient Encounter Medications as of 04/03/2021  ?Medication Sig  ? albuterol (VENTOLIN HFA) 108 (90 Base) MCG/ACT inhaler Inhale 2 puffs into the lungs every 6 (six) hours as needed for wheezing.  ? amLODipine (NORVASC) 10 MG tablet One tablet by mouth every day for blood pressure control.  ? atorvastatin (LIPITOR) 40 MG tablet Take 1 tablet (40 mg total) by mouth daily.  ? chlorthalidone (HYGROTON) 25 MG tablet Take 1 tablet (25 mg total) by mouth daily as needed.  ? cyclobenzaprine (FLEXERIL) 10 MG tablet Take 1 tablet (10 mg total) by mouth 3 (three) times daily as needed for muscle spasms.  ? escitalopram (LEXAPRO) 10 MG tablet Take 1 tablet (10 mg total) by mouth daily.  ? fluticasone furoate-vilanterol (  BREO ELLIPTA) 200-25 MCG/ACT AEPB Inhale 1 puff into the lungs daily.  ? loratadine (CLARITIN) 10 MG tablet Take 1 tablet (10 mg total) by mouth daily.  ? methotrexate (RHEUMATREX) 2.5 MG tablet Take 20 mg by mouth every Tuesday. Caution:Chemotherapy. Protect from light.  ? ?Every other tuesday  ? Nicotine 21-14-7 MG/24HR KIT Place 1 patch onto the skin daily.  ? omeprazole (PRILOSEC) 40 MG capsule Take 1 capsule (40 mg total) by mouth daily.  ? oxyCODONE-acetaminophen (PERCOCET) 7.5-325 MG tablet Take 1 tablet by mouth 3 (three) times daily as needed.  ? pregabalin (LYRICA) 75 MG capsule Take 1 capsule (75 mg total) by mouth 2 (two) times daily.  ? promethazine (PHENERGAN) 25 MG tablet Take 1 tablet (25 mg total) by mouth every 6 (six) hours  as needed for nausea or vomiting.  ? SUMAtriptan (IMITREX) 100 MG tablet Take 0.5 tablets (50 mg total) by mouth every 2 (two) hours as needed for migraine. May repeat in 2 hours if headache persists or recurs.  ? valsartan-hydrochlorothiazide (DIOVAN-HCT) 160-25 MG tablet Take 1 tablet by mouth daily.  ? ?No facility-administered encounter medications on file as of 04/03/2021.  ? ? ?No results found for this or any previous visit (from the past 2160 hour(s)). ? ?There were no vitals taken for this visit. ? ? ?Review of Systems  ?Cardiovascular:  Negative for palpitations.  ?Skin:  Negative for itching.  ?Psychiatric/Behavioral:  Negative for depression and suicidal ideas.   ? ?Mental Status Examination  ?Appearance:casual ?Alert: Yes ?Attention: fair  ?Cooperative: Yes ?Eye Contact: ?Speech: coherent ?Psychomotor Activity: Normal ?Memory/Concentration: adequate ?Oriented: person, place, time/date and situation ?Mood: fair ?Affect:congruent ?Thought Processes and Associations: Coherent ?Fund of Knowledge: Fair ?Thought Content: Suicidal ideation and Homicidal ideation were denied. Denies hallucinations ?Insight: Fair ?Judgement: Fair ? ?Diagnosis: Major depressive disorder recurrent moderate to severe. Generalized anxiety disorder. Insomnia. 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 ?Treatment Plan: Reviewed prior documentation ? ?Depression: stable continue lexapro  ?Insomnia: not worse, work on sleep hygiene ? ?Anxiety : manageable on lexapro  ? ?Fu 55m?Call for refills ? ?NMerian Capron MD ? ?

## 2021-04-03 NOTE — Progress Notes (Signed)
?  HPI with pertinent ROS:  ? ?CC: chronic disease follow up ? ?HPI: ?Very pleasant 51 year old male presenting today for follow-up on: ? ?Sleep apnea-continues to have issues sleeping and is unable to sleep on his back.  Has to have at least 2 pillows and be propped on his side to be able to get any rest.  He did do a sleep study but unfortunately had to repeat it due to some issues with the readings.  Completed this the other night and is waiting on the report. ? ?HLD- taking Lipitor  daily, tolerating well without side effects.  ? ?HTN- taking Amlodipine  at night and Chlorthalidone  daily as prescribed, tolerating well without side effects. Checking BP at home with elevated readings similar to today. Has a manual cuff at home. Feels that machines always read high on him.  ? ?I reviewed the past medical history, family history, social history, surgical history, and allergies today and no changes were needed.  Please see the problem list section below in epic for further details. ? ? ?Physical exam:  ? ?General: Well Developed, well nourished, and in no acute distress.  ?Neuro: Alert and oriented x3.  ?HEENT: Normocephalic, atraumatic.  ?Skin: Warm and dry. ?Cardiac: Regular rate and rhythm, no murmurs rubs or gallops, no lower extremity edema.  ?Respiratory: Clear to auscultation bilaterally. Not using accessory muscles, speaking in full sentences. ? ?Impression and Recommendations:   ? ?1. History of obstructive sleep apnea ?Sleep study complete, pending report. Would like a CPAP if found to have OSA.  ? ?2. Hyperlipidemia, unspecified hyperlipidemia type ?Checking lipids. Continue Lipitor at  daily.  ? ?3. Essential hypertension ?Uncontrolled. Continue Amlodipine  daily. Stop Chlorthalidone. Start Valsartan-HCTZ 160-25mg  daily. Continue monitoring BP at home with a goal of 130/80 or less. Checking labs.  ? ?4. Screening PSA (prostate specific antigen) ?Checking PSA. ? ?5. No energy ?Checking  testosterone.  ? ?Return in about 2 weeks (around 04/17/2021) for nurse visit for BP check. ?___________________________________________ ?Thayer Ohm, DNP, APRN, FNP-BC ?Primary Care and Sports Medicine ?[facility] ?

## 2021-04-04 LAB — COMPLETE METABOLIC PANEL WITH GFR
AG Ratio: 1.4 (calc) (ref 1.0–2.5)
ALT: 34 U/L (ref 9–46)
AST: 26 U/L (ref 10–35)
Albumin: 4.3 g/dL (ref 3.6–5.1)
Alkaline phosphatase (APISO): 49 U/L (ref 35–144)
BUN: 11 mg/dL (ref 7–25)
CO2: 28 mmol/L (ref 20–32)
Calcium: 9.8 mg/dL (ref 8.6–10.3)
Chloride: 102 mmol/L (ref 98–110)
Creat: 1.03 mg/dL (ref 0.70–1.30)
Globulin: 3.1 g/dL (calc) (ref 1.9–3.7)
Glucose, Bld: 171 mg/dL — ABNORMAL HIGH (ref 65–99)
Potassium: 4.8 mmol/L (ref 3.5–5.3)
Sodium: 137 mmol/L (ref 135–146)
Total Bilirubin: 0.5 mg/dL (ref 0.2–1.2)
Total Protein: 7.4 g/dL (ref 6.1–8.1)
eGFR: 88 mL/min/{1.73_m2} (ref >=60)

## 2021-04-04 LAB — CBC
HCT: 48.7 % (ref 38.5–50.0)
Hemoglobin: 16.3 g/dL (ref 13.2–17.1)
MCH: 29.6 pg (ref 27.0–33.0)
MCHC: 33.5 g/dL (ref 32.0–36.0)
MCV: 88.5 fL (ref 80.0–100.0)
MPV: 9.8 fL (ref 7.5–12.5)
Platelets: 318 10*3/uL (ref 140–400)
RBC: 5.5 10*6/uL (ref 4.20–5.80)
RDW: 13.1 % (ref 11.0–15.0)
WBC: 8.5 10*3/uL (ref 3.8–10.8)

## 2021-04-04 LAB — LIPID PANEL
Cholesterol: 216 mg/dL — ABNORMAL HIGH (ref <200)
HDL: 32 mg/dL — ABNORMAL LOW (ref >=40)
LDL Cholesterol (Calc): 155 mg/dL (calc) — ABNORMAL HIGH
Non-HDL Cholesterol (Calc): 184 mg/dL (calc) — ABNORMAL HIGH (ref <130)
Total CHOL/HDL Ratio: 6.8 (calc) — ABNORMAL HIGH (ref <5.0)
Triglycerides: 154 mg/dL — ABNORMAL HIGH (ref <150)

## 2021-04-04 LAB — TESTOSTERONE TOTAL,FREE,BIO, MALES
Albumin: 4.3 g/dL (ref 3.6–5.1)
Sex Hormone Binding: 26 nmol/L (ref 10–50)
Testosterone, Bioavailable: 89.6 ng/dL — ABNORMAL LOW (ref 110.0–575.0)
Testosterone, Free: 45.5 pg/mL — ABNORMAL LOW (ref 46.0–224.0)
Testosterone: 294 ng/dL (ref 250–827)

## 2021-04-04 LAB — PSA: PSA: 1.56 ng/mL (ref <=4.00)

## 2021-04-05 DIAGNOSIS — R0681 Apnea, not elsewhere classified: Secondary | ICD-10-CM

## 2021-04-05 DIAGNOSIS — G4733 Obstructive sleep apnea (adult) (pediatric): Secondary | ICD-10-CM | POA: Diagnosis not present

## 2021-04-05 DIAGNOSIS — G479 Sleep disorder, unspecified: Secondary | ICD-10-CM | POA: Diagnosis not present

## 2021-04-05 DIAGNOSIS — R0683 Snoring: Secondary | ICD-10-CM | POA: Diagnosis not present

## 2021-04-05 NOTE — Procedures (Signed)
° ° ° °  Patient Name: [name], [name] Date: 04/02/2021 Gender: Male D.O.B: [birth year] Age (years): 50 Referring Provider: Tandy Gaw Height (inches): 68 Interpreting Physician: Jetty Duhamel MD, ABSM Weight (lbs): 234 RPSGT: [place] BMI: 36 MRN: [id number] Neck Size: 18.00  CLINICAL INFORMATION Sleep Study Type: HST Indication for sleep study: Snoring, Witnessed Apneas Epworth Sleepiness Score: 15  SLEEP STUDY TECHNIQUE A multi-channel overnight portable sleep study was performed. The channels recorded were: nasal airflow, thoracic respiratory movement, and oxygen saturation with a pulse oximetry. Snoring was also monitored.  MEDICATIONS Patient self administered medications include: none reported.  SLEEP ARCHITECTURE Patient was studied for 327.4 minutes. The sleep efficiency was 100.0 % and the patient was supine for 94%. The arousal index was 0.0 per hour.  RESPIRATORY PARAMETERS The overall AHI was 21.8 per hour, with a central apnea index of 0 per hour. The oxygen nadir was 85% during sleep.  CARDIAC DATA Mean heart rate during sleep was 71.5 bpm.  IMPRESSIONS - Moderate obstructive sleep apnea occurred during this study (AHI = 21.8/h). - Moderate oxygen desaturation was noted during this study (Min O2 = 85%). Mean 94% - Patient snored.  DIAGNOSIS - Obstructive Sleep Apnea (G47.33)  RECOMMENDATIONS - Suggest CPAP titration sleep study or autopap. Other options would be based on clinical judgment. - Be careful with alcohol, sedatives and other CNS depressants that may worsen sleep apnea and disrupt normal sleep architecture. - Sleep hygiene should be reviewed to assess factors that may improve sleep quality. - Weight management and regular exercise should be initiated or continued.  [Electronically signed] 04/05/2021 11:55 AM  Jetty Duhamel MD, ABSM Diplomate, American Board of Sleep Medicine   NPI: [id number]                         Jetty Duhamel Diplomate, American Board of Sleep Medicine  ELECTRONICALLY SIGNED ON:  04/05/2021, 11:53 AM [facility] PH: [phone]   FX: [phone] ACCREDITED BY THE AMERICAN ACADEMY OF SLEEP MEDICINE

## 2021-04-08 ENCOUNTER — Other Ambulatory Visit: Payer: Self-pay | Admitting: Medical-Surgical

## 2021-04-08 ENCOUNTER — Telehealth: Payer: Self-pay | Admitting: Medical-Surgical

## 2021-04-08 DIAGNOSIS — E785 Hyperlipidemia, unspecified: Secondary | ICD-10-CM

## 2021-04-08 MED ORDER — ATORVASTATIN CALCIUM 80 MG PO TABS: 80.0000 mg | ORAL_TABLET | Freq: Every day | ORAL | 1 refills | Status: DC

## 2021-04-08 NOTE — Telephone Encounter (Signed)
Erroneous encounter

## 2021-04-17 ENCOUNTER — Ambulatory Visit: Payer: Medicare Other

## 2021-04-21 ENCOUNTER — Encounter: Payer: Self-pay | Admitting: Physician Assistant

## 2021-04-21 DIAGNOSIS — G4733 Obstructive sleep apnea (adult) (pediatric): Secondary | ICD-10-CM | POA: Insufficient documentation

## 2021-04-21 HISTORY — DX: Obstructive sleep apnea (adult) (pediatric): G47.33

## 2021-04-21 NOTE — Progress Notes (Signed)
Error

## 2021-04-22 ENCOUNTER — Other Ambulatory Visit: Payer: Self-pay | Admitting: Medical-Surgical

## 2021-04-22 DIAGNOSIS — G478 Other sleep disorders: Secondary | ICD-10-CM

## 2021-04-22 DIAGNOSIS — G4733 Obstructive sleep apnea (adult) (pediatric): Secondary | ICD-10-CM

## 2021-04-23 NOTE — Progress Notes (Signed)
Order placed for CPAP and message sent to [facility] via Epic to get patient set up.  ?

## 2021-04-25 NOTE — Progress Notes (Signed)
Jari Favrewens, Ashley sent to Silvio PateMcCracken, Jayin Derousse L, CMA; Dimas MillinSoldano, Christina ?got it   ?  ?   ?Previous Messages ?  ?----- Message -----  ?From: Silvio PateMcCracken, Curry Seefeldt L, CMA  ?Sent: 04/23/2021   1:19 PM EDT  ?To: Wilford Sportshristina Soldano, Ashley Owens  ?Subject: CPAP                                          ? ?CPAP order in Epic, please contact patient to get set up. Thanks!  ? ?JJ, CMA  ?

## 2021-07-14 ENCOUNTER — Telehealth (HOSPITAL_COMMUNITY): Payer: Medicare Other | Admitting: Psychiatry

## 2021-08-08 ENCOUNTER — Inpatient Hospital Stay (HOSPITAL_COMMUNITY)
Admission: EM | Admit: 2021-08-08 | Discharge: 2021-08-11 | DRG: 247 | Disposition: A | Discharge status: Home / Self Care | Payer: Medicare Other | Attending: Interventional Cardiology | Admitting: Interventional Cardiology

## 2021-08-08 ENCOUNTER — Inpatient Hospital Stay (HOSPITAL_COMMUNITY)
Admission: EM | Disposition: A | Discharge status: Home / Self Care | Payer: Self-pay | Source: Home / Self Care | Attending: Interventional Cardiology

## 2021-08-08 DIAGNOSIS — E782 Mixed hyperlipidemia: Secondary | ICD-10-CM | POA: Diagnosis present

## 2021-08-08 DIAGNOSIS — E785 Hyperlipidemia, unspecified: Secondary | ICD-10-CM | POA: Diagnosis present

## 2021-08-08 DIAGNOSIS — I25118 Atherosclerotic heart disease of native coronary artery with other forms of angina pectoris: Secondary | ICD-10-CM | POA: Diagnosis not present

## 2021-08-08 DIAGNOSIS — R0789 Other chest pain: Secondary | ICD-10-CM | POA: Diagnosis present

## 2021-08-08 DIAGNOSIS — E119 Type 2 diabetes mellitus without complications: Secondary | ICD-10-CM | POA: Diagnosis present

## 2021-08-08 DIAGNOSIS — M069 Rheumatoid arthritis, unspecified: Secondary | ICD-10-CM | POA: Diagnosis present

## 2021-08-08 DIAGNOSIS — F419 Anxiety disorder, unspecified: Secondary | ICD-10-CM | POA: Diagnosis present

## 2021-08-08 DIAGNOSIS — F17210 Nicotine dependence, cigarettes, uncomplicated: Secondary | ICD-10-CM | POA: Diagnosis present

## 2021-08-08 DIAGNOSIS — Z955 Presence of coronary angioplasty implant and graft: Secondary | ICD-10-CM

## 2021-08-08 DIAGNOSIS — I213 ST elevation (STEMI) myocardial infarction of unspecified site: Secondary | ICD-10-CM | POA: Diagnosis not present

## 2021-08-08 DIAGNOSIS — G4733 Obstructive sleep apnea (adult) (pediatric): Secondary | ICD-10-CM | POA: Diagnosis present

## 2021-08-08 DIAGNOSIS — I2510 Atherosclerotic heart disease of native coronary artery without angina pectoris: Secondary | ICD-10-CM | POA: Diagnosis not present

## 2021-08-08 DIAGNOSIS — M1990 Unspecified osteoarthritis, unspecified site: Secondary | ICD-10-CM | POA: Diagnosis present

## 2021-08-08 DIAGNOSIS — R079 Chest pain, unspecified: Secondary | ICD-10-CM | POA: Diagnosis not present

## 2021-08-08 DIAGNOSIS — I10 Essential (primary) hypertension: Secondary | ICD-10-CM | POA: Diagnosis present

## 2021-08-08 DIAGNOSIS — I2109 ST elevation (STEMI) myocardial infarction involving other coronary artery of anterior wall: Principal | ICD-10-CM | POA: Diagnosis present

## 2021-08-08 DIAGNOSIS — M109 Gout, unspecified: Secondary | ICD-10-CM | POA: Diagnosis present

## 2021-08-08 DIAGNOSIS — Z79631 Long term (current) use of antimetabolite agent: Secondary | ICD-10-CM

## 2021-08-08 DIAGNOSIS — Z720 Tobacco use: Secondary | ICD-10-CM | POA: Diagnosis present

## 2021-08-08 DIAGNOSIS — I2102 ST elevation (STEMI) myocardial infarction involving left anterior descending coronary artery: Secondary | ICD-10-CM | POA: Diagnosis present

## 2021-08-08 DIAGNOSIS — I25110 Atherosclerotic heart disease of native coronary artery with unstable angina pectoris: Secondary | ICD-10-CM | POA: Diagnosis present

## 2021-08-08 DIAGNOSIS — Z7951 Long term (current) use of inhaled steroids: Secondary | ICD-10-CM

## 2021-08-08 DIAGNOSIS — Z888 Allergy status to other drugs, medicaments and biological substances status: Secondary | ICD-10-CM

## 2021-08-08 DIAGNOSIS — F32A Depression, unspecified: Secondary | ICD-10-CM | POA: Diagnosis present

## 2021-08-08 DIAGNOSIS — Z8249 Family history of ischemic heart disease and other diseases of the circulatory system: Secondary | ICD-10-CM

## 2021-08-08 DIAGNOSIS — Z87442 Personal history of urinary calculi: Secondary | ICD-10-CM | POA: Diagnosis not present

## 2021-08-08 DIAGNOSIS — Z79899 Other long term (current) drug therapy: Secondary | ICD-10-CM | POA: Diagnosis not present

## 2021-08-08 DIAGNOSIS — Z833 Family history of diabetes mellitus: Secondary | ICD-10-CM

## 2021-08-08 HISTORY — DX: Atherosclerotic heart disease of native coronary artery with unstable angina pectoris: I25.110

## 2021-08-08 HISTORY — PX: CORONARY/GRAFT ACUTE MI REVASCULARIZATION: CATH118305

## 2021-08-08 HISTORY — DX: ST elevation (STEMI) myocardial infarction involving left anterior descending coronary artery: I21.02

## 2021-08-08 HISTORY — PX: INTRAVASCULAR ULTRASOUND/IVUS: CATH118244

## 2021-08-08 HISTORY — PX: LEFT HEART CATH AND CORONARY ANGIOGRAPHY: CATH118249

## 2021-08-08 LAB — COMPREHENSIVE METABOLIC PANEL
ALT: 40 U/L (ref 0–44)
AST: 41 U/L (ref 15–41)
Albumin: 3.6 g/dL (ref 3.5–5.0)
Alkaline Phosphatase: 46 U/L (ref 38–126)
Anion gap: 14 (ref 5–15)
BUN: 9 mg/dL (ref 6–20)
CO2: 19 mmol/L — ABNORMAL LOW (ref 22–32)
Calcium: 9 mg/dL (ref 8.9–10.3)
Chloride: 102 mmol/L (ref 98–111)
Creatinine, Ser: 1 mg/dL (ref 0.61–1.24)
GFR, Estimated: >60 mL/min (ref >60)
Glucose, Bld: 216 mg/dL — ABNORMAL HIGH (ref 70–99)
Potassium: 3.7 mmol/L (ref 3.5–5.1)
Sodium: 135 mmol/L (ref 135–145)
Total Bilirubin: 0.8 mg/dL (ref 0.3–1.2)
Total Protein: 6.8 g/dL (ref 6.5–8.1)

## 2021-08-08 LAB — POCT ACTIVATED CLOTTING TIME: Activated Clotting Time: 305 seconds

## 2021-08-08 LAB — CBC
HCT: 44.8 % (ref 39.0–52.0)
Hemoglobin: 15.5 g/dL (ref 13.0–17.0)
MCH: 29.7 pg (ref 26.0–34.0)
MCHC: 34.6 g/dL (ref 30.0–36.0)
MCV: 85.8 fL (ref 80.0–100.0)
Platelets: 397 10*3/uL (ref 150–400)
RBC: 5.22 MIL/uL (ref 4.22–5.81)
RDW: 13.2 % (ref 11.5–15.5)
WBC: 11 10*3/uL — ABNORMAL HIGH (ref 4.0–10.5)
nRBC: 0 % (ref 0.0–0.2)

## 2021-08-08 LAB — POCT I-STAT, CHEM 8
BUN: 9 mg/dL (ref 6–20)
Calcium, Ion: 1.13 mmol/L — ABNORMAL LOW (ref 1.15–1.40)
Chloride: 103 mmol/L (ref 98–111)
Creatinine, Ser: 0.9 mg/dL (ref 0.61–1.24)
Glucose, Bld: 220 mg/dL — ABNORMAL HIGH (ref 70–99)
HCT: 46 % (ref 39.0–52.0)
Hemoglobin: 15.6 g/dL (ref 13.0–17.0)
Potassium: 3.7 mmol/L (ref 3.5–5.1)
Sodium: 137 mmol/L (ref 135–145)
TCO2: 20 mmol/L — ABNORMAL LOW (ref 22–32)

## 2021-08-08 LAB — MRSA NEXT GEN BY PCR, NASAL: MRSA by PCR Next Gen: NOT DETECTED

## 2021-08-08 LAB — HEMOGLOBIN A1C
Hgb A1c MFr Bld: 6.8 % — ABNORMAL HIGH (ref 4.8–5.6)
Mean Plasma Glucose: 148.46 mg/dL

## 2021-08-08 LAB — TROPONIN I (HIGH SENSITIVITY): Troponin I (High Sensitivity): 156 ng/L (ref <18)

## 2021-08-08 SURGERY — LEFT HEART CATH AND CORONARY ANGIOGRAPHY
Anesthesia: LOCAL

## 2021-08-08 MED ORDER — HEPARIN SODIUM (PORCINE) 1000 UNIT/ML IJ SOLN
INTRAMUSCULAR | Status: AC
  Filled 2021-08-08: qty 10

## 2021-08-08 MED ORDER — TIROFIBAN (AGGRASTAT) BOLUS VIA INFUSION
INTRAVENOUS | Status: DC | PRN
  Administered 2021-08-08: 2725 ug via INTRAVENOUS

## 2021-08-08 MED ORDER — CYCLOBENZAPRINE HCL 10 MG PO TABS: 10.0000 mg | ORAL_TABLET | Freq: Three times a day (TID) | ORAL | Status: DC | PRN

## 2021-08-08 MED ORDER — SODIUM CHLORIDE 0.9 % IV SOLN: 250.0000 mL | INTRAVENOUS | Status: DC | PRN

## 2021-08-08 MED ORDER — ALBUTEROL SULFATE (2.5 MG/3ML) 0.083% IN NEBU
2.5000 mg | INHALATION_SOLUTION | Freq: Four times a day (QID) | RESPIRATORY_TRACT | Status: DC | PRN
  Administered 2021-08-08: 2.5 mg via RESPIRATORY_TRACT
  Filled 2021-08-08: qty 3

## 2021-08-08 MED ORDER — VERAPAMIL HCL 2.5 MG/ML IV SOLN
INTRAVENOUS | Status: DC | PRN
  Administered 2021-08-08: 10 mL via INTRA_ARTERIAL

## 2021-08-08 MED ORDER — LIDOCAINE HCL (PF) 1 % IJ SOLN
INTRAMUSCULAR | Status: DC | PRN
  Administered 2021-08-08: 5 mL via INTRADERMAL

## 2021-08-08 MED ORDER — ONDANSETRON HCL 4 MG/2ML IJ SOLN
4.0000 mg | Freq: Four times a day (QID) | INTRAMUSCULAR | Status: DC | PRN
Start: 2021-08-08 — End: 2021-08-11

## 2021-08-08 MED ORDER — ATORVASTATIN CALCIUM 80 MG PO TABS
80.0000 mg | ORAL_TABLET | Freq: Every day | ORAL | Status: DC
  Administered 2021-08-09 – 2021-08-10 (×2): 80 mg via ORAL
  Filled 2021-08-08 (×3): qty 1

## 2021-08-08 MED ORDER — SODIUM CHLORIDE 0.9 % IV SOLN
INTRAVENOUS | Status: AC | PRN
  Administered 2021-08-08: 100 mL/h via INTRAVENOUS

## 2021-08-08 MED ORDER — OXYCODONE-ACETAMINOPHEN 5-325 MG PO TABS
1.0000 | ORAL_TABLET | Freq: Three times a day (TID) | ORAL | Status: DC | PRN
  Administered 2021-08-08 – 2021-08-10 (×6): 1 via ORAL
  Filled 2021-08-08 (×6): qty 1

## 2021-08-08 MED ORDER — ORAL CARE MOUTH RINSE: 15.0000 mL | OROMUCOSAL | Status: DC | PRN

## 2021-08-08 MED ORDER — ESCITALOPRAM OXALATE 10 MG PO TABS
10.0000 mg | ORAL_TABLET | Freq: Every day | ORAL | Status: DC
Start: 2021-08-08 — End: 2021-08-11
  Administered 2021-08-08 – 2021-08-10 (×3): 10 mg via ORAL
  Filled 2021-08-08 (×3): qty 1

## 2021-08-08 MED ORDER — LORATADINE 10 MG PO TABS
10.0000 mg | ORAL_TABLET | Freq: Every day | ORAL | Status: DC
  Administered 2021-08-08 – 2021-08-10 (×3): 10 mg via ORAL
  Filled 2021-08-08 (×3): qty 1

## 2021-08-08 MED ORDER — ACETAMINOPHEN 325 MG PO TABS: 650.0000 mg | ORAL_TABLET | ORAL | Status: DC | PRN

## 2021-08-08 MED ORDER — LABETALOL HCL 5 MG/ML IV SOLN: 10.0000 mg | INTRAVENOUS | Status: AC | PRN

## 2021-08-08 MED ORDER — TICAGRELOR 90 MG PO TABS
ORAL_TABLET | ORAL | Status: AC
  Filled 2021-08-08: qty 2

## 2021-08-08 MED ORDER — SODIUM CHLORIDE 0.9% FLUSH: 3.0000 mL | INTRAVENOUS | Status: DC | PRN

## 2021-08-08 MED ORDER — FENTANYL CITRATE (PF) 100 MCG/2ML IJ SOLN
INTRAMUSCULAR | Status: AC
  Filled 2021-08-08: qty 2

## 2021-08-08 MED ORDER — OXYCODONE-ACETAMINOPHEN 7.5-325 MG PO TABS: 1.0000 | ORAL_TABLET | Freq: Three times a day (TID) | ORAL | Status: DC | PRN

## 2021-08-08 MED ORDER — FENTANYL CITRATE (PF) 100 MCG/2ML IJ SOLN
INTRAMUSCULAR | Status: DC | PRN
Start: 2021-08-08 — End: 2021-08-08
  Administered 2021-08-08: 25 ug via INTRAVENOUS

## 2021-08-08 MED ORDER — FLUTICASONE FUROATE-VILANTEROL 200-25 MCG/ACT IN AEPB
1.0000 | INHALATION_SPRAY | Freq: Every day | RESPIRATORY_TRACT | Status: DC
  Administered 2021-08-09 – 2021-08-10 (×2): 1 via RESPIRATORY_TRACT
  Filled 2021-08-08: qty 28

## 2021-08-08 MED ORDER — HYDRALAZINE HCL 20 MG/ML IJ SOLN: 10.0000 mg | INTRAMUSCULAR | Status: AC | PRN

## 2021-08-08 MED ORDER — OXYCODONE HCL 5 MG PO TABS: 2.5000 mg | ORAL_TABLET | Freq: Three times a day (TID) | ORAL | Status: DC | PRN

## 2021-08-08 MED ORDER — MIDAZOLAM HCL 2 MG/2ML IJ SOLN
INTRAMUSCULAR | Status: AC
  Filled 2021-08-08: qty 2

## 2021-08-08 MED ORDER — NICOTINE 21 MG/24HR TD PT24
21.0000 mg | MEDICATED_PATCH | Freq: Every day | TRANSDERMAL | Status: DC
  Administered 2021-08-09 – 2021-08-10 (×2): 21 mg via TRANSDERMAL
  Filled 2021-08-08 (×2): qty 1

## 2021-08-08 MED ORDER — PROMETHAZINE HCL 25 MG PO TABS: 25.0000 mg | ORAL_TABLET | Freq: Four times a day (QID) | ORAL | Status: DC | PRN

## 2021-08-08 MED ORDER — PREGABALIN 75 MG PO CAPS
75.0000 mg | ORAL_CAPSULE | Freq: Two times a day (BID) | ORAL | Status: DC
  Administered 2021-08-08 – 2021-08-10 (×5): 75 mg via ORAL
  Filled 2021-08-08 (×5): qty 1

## 2021-08-08 MED ORDER — PANTOPRAZOLE SODIUM 40 MG PO TBEC
40.0000 mg | DELAYED_RELEASE_TABLET | Freq: Every day | ORAL | Status: DC
  Administered 2021-08-09 – 2021-08-10 (×2): 40 mg via ORAL
  Filled 2021-08-08 (×3): qty 1

## 2021-08-08 MED ORDER — SODIUM CHLORIDE 0.9% FLUSH
3.0000 mL | Freq: Two times a day (BID) | INTRAVENOUS | Status: DC
  Administered 2021-08-09 – 2021-08-10 (×4): 3 mL via INTRAVENOUS

## 2021-08-08 MED ORDER — NITROGLYCERIN 1 MG/10 ML FOR IR/CATH LAB
INTRA_ARTERIAL | Status: AC
Start: 2021-08-08 — End: ?
  Filled 2021-08-08: qty 10

## 2021-08-08 MED ORDER — METHOTREXATE 2.5 MG PO TABS: 20.0000 mg | ORAL_TABLET | ORAL | Status: DC

## 2021-08-08 MED ORDER — MIDAZOLAM HCL 2 MG/2ML IJ SOLN
INTRAMUSCULAR | Status: DC | PRN
  Administered 2021-08-08: 1 mg via INTRAVENOUS

## 2021-08-08 MED ORDER — TICAGRELOR 90 MG PO TABS
90.0000 mg | ORAL_TABLET | Freq: Two times a day (BID) | ORAL | Status: DC
Start: 2021-08-09 — End: 2021-08-11
  Administered 2021-08-09 – 2021-08-11 (×4): 90 mg via ORAL
  Filled 2021-08-08 (×4): qty 1

## 2021-08-08 MED ORDER — TICAGRELOR 90 MG PO TABS
ORAL_TABLET | ORAL | Status: DC | PRN
  Administered 2021-08-08: 180 mg via ORAL

## 2021-08-08 MED ORDER — ASPIRIN 81 MG PO CHEW
81.0000 mg | CHEWABLE_TABLET | Freq: Every day | ORAL | Status: DC
Start: 2021-08-09 — End: 2021-08-11
  Administered 2021-08-09 – 2021-08-10 (×2): 81 mg via ORAL
  Filled 2021-08-08 (×3): qty 1

## 2021-08-08 MED ORDER — ALBUTEROL SULFATE HFA 108 (90 BASE) MCG/ACT IN AERS: 2.0000 | INHALATION_SPRAY | Freq: Four times a day (QID) | RESPIRATORY_TRACT | Status: DC | PRN

## 2021-08-08 MED ORDER — VERAPAMIL HCL 2.5 MG/ML IV SOLN
INTRAVENOUS | Status: AC
  Filled 2021-08-08: qty 2

## 2021-08-08 MED ORDER — HEPARIN (PORCINE) IN NACL 1000-0.9 UT/500ML-% IV SOLN
INTRAVENOUS | Status: AC
  Filled 2021-08-08: qty 1000

## 2021-08-08 MED ORDER — HEPARIN SODIUM (PORCINE) 1000 UNIT/ML IJ SOLN
INTRAMUSCULAR | Status: DC | PRN
  Administered 2021-08-08: 1000 [IU] via INTRAVENOUS
  Administered 2021-08-08: 11000 [IU] via INTRAVENOUS

## 2021-08-08 MED ORDER — IOHEXOL 350 MG/ML SOLN
INTRAVENOUS | Status: DC | PRN
  Administered 2021-08-08: 115 mL

## 2021-08-08 MED ORDER — NICOTINE 21-14-7 MG/24HR TD KIT
1.0000 | PACK | Freq: Every day | TRANSDERMAL | Status: DC
Start: 2021-08-08 — End: 2021-08-08

## 2021-08-08 MED ORDER — TIROFIBAN HCL IN NACL 5-0.9 MG/100ML-% IV SOLN
INTRAVENOUS | Status: AC
  Filled 2021-08-08: qty 100

## 2021-08-08 MED ORDER — METOPROLOL TARTRATE 12.5 MG HALF TABLET
12.5000 mg | ORAL_TABLET | Freq: Two times a day (BID) | ORAL | Status: DC
  Administered 2021-08-08 – 2021-08-10 (×6): 12.5 mg via ORAL
  Filled 2021-08-08 (×6): qty 1

## 2021-08-08 MED ORDER — ESCITALOPRAM OXALATE 10 MG PO TABS
10.0000 mg | ORAL_TABLET | Freq: Every day | ORAL | Status: DC
  Filled 2021-08-08: qty 1

## 2021-08-08 MED ORDER — LIDOCAINE HCL (PF) 1 % IJ SOLN
INTRAMUSCULAR | Status: AC
  Filled 2021-08-08: qty 30

## 2021-08-08 MED ORDER — HEPARIN (PORCINE) IN NACL 1000-0.9 UT/500ML-% IV SOLN
INTRAVENOUS | Status: DC | PRN
  Administered 2021-08-08 (×2): 500 mL

## 2021-08-08 MED ORDER — SODIUM CHLORIDE 0.9 % IV SOLN: INTRAVENOUS | Status: AC

## 2021-08-08 SURGICAL SUPPLY — 23 items
BALL SAPPHIRE NC24 3.25X12 (BALLOONS) ×2
BALLOON SAPPHIRE NC24 3.25X12 (BALLOONS) IMPLANT
BAND CMPR LRG ZPHR (HEMOSTASIS) ×1
BAND ZEPHYR COMPRESS 30 LONG (HEMOSTASIS) ×1 IMPLANT
CATH 5FR JL3.5 JR4 ANG PIG MP (CATHETERS) ×1 IMPLANT
CATH EXTRAC PRONTO 5.5F 138CM (CATHETERS) ×1 IMPLANT
CATH LAUNCHER 6FR EBU3.5 (CATHETERS) ×1 IMPLANT
CATH OPTICROSS HD (CATHETERS) ×1 IMPLANT
ELECT DEFIB PAD ADLT CADENCE (PAD) ×1 IMPLANT
GLIDESHEATH SLEND SS 6F .021 (SHEATH) ×1 IMPLANT
GUIDEWIRE INQWIRE 1.5J.035X260 (WIRE) IMPLANT
INQWIRE 1.5J .035X260CM (WIRE) ×2
KIT ENCORE 26 ADVANTAGE (KITS) ×1 IMPLANT
KIT HEART LEFT (KITS) ×3 IMPLANT
PACK CARDIAC CATHETERIZATION (CUSTOM PROCEDURE TRAY) ×3 IMPLANT
SLED PULL BACK IVUS (MISCELLANEOUS) ×1 IMPLANT
STENT SYNERGY XD 2.75X16 (Permanent Stent) IMPLANT
SYNERGY XD 2.75X16 (Permanent Stent) ×2 IMPLANT
SYR CONTROL 10ML ANGIOGRAPHIC (SYRINGE) ×1 IMPLANT
TRANSDUCER W/STOPCOCK (MISCELLANEOUS) ×3 IMPLANT
TUBING CIL FLEX 10 FLL-RA (TUBING) ×3 IMPLANT
VALVE GUARDIAN II ~~LOC~~ HEMO (MISCELLANEOUS) ×1 IMPLANT
WIRE ASAHI PROWATER 180CM (WIRE) ×1 IMPLANT

## 2021-08-08 NOTE — H&P (Addendum)
Cardiology Admission History and Physical:   Patient ID: TARA WICH MRN: [id number]; DOB: 01-26-1971   Admission date: 08/08/2021  PCP:  Samuel Bouche, NP   [facility] Providers Cardiologist:  New to Dr. Irish Lack   Chief Complaint:  Chest pain/ STEMI  Patient Profile:   NIDAL RIVET is a 51 y.o. male with hypertension, hyperlipidemia, sleep apnea on CPAP and longstanding tobacco smoking (ongoing) who is being seen 08/08/2021 for the evaluation of anterior lateral STEMI.  History of Present Illness:   Mr. Kennerly had a 3 to 4 days history of intermittent chest pain.  He describes his pain as squeezing chest pressure at middle of his chest.  This morning his pain was persistent and more severe.  He went to see his PCP and found to have anterior lateral ST elevation.  Code STEMI was called and EMS activated.  In route, he received aspirin 324 mg, 4 nitroglycerin and 6 mg of IV morphine.  Patient was seen in cardiac catheterization lab.  He continues to have 10 out of 10 chest pressure radiating to his jaw and left shoulder.  Denies prior history of stroke, MI or bleeding.  Reports longstanding history of tobacco smoking and family history of CAD.  Past Medical History:  Diagnosis Date   Anxiety    Arthritis    Asthma    history [id number]   Depression    Essential hypertension 12/11/2011   Gout    Headache    History of hiatal hernia    History of lithotripsy    Hypertension    Kidney stones    Lumbar degenerative disc disease 12/11/2011   Moderate obstructive sleep apnea 04/21/2021   Nephrolithiasis 12/11/2011   Pericarditis    Renal tubular acidosis    Rheumatoid arthritis(714.0)     Past Surgical History:  Procedure Laterality Date   ANTERIOR CERVICAL DECOMP/DISCECTOMY FUSION     ANTERIOR CERVICAL DECOMP/DISCECTOMY FUSION N/A 11/25/2017   Procedure: Anterior Cervical Discectomy Fusion - Cervical Six-Cervical Seven, removal Cervical Five-Six plate;  Surgeon:  Eustace Moore, MD;  Location: [place];  Service: Neurosurgery;  Laterality: N/A;  Anterior Cervical Discectomy Fusion - Cervical Six-Cervical Seven, removal Cervical Five-Six plate   CARPAL TUNNEL RELEASE Left 11/06/2015   Procedure: LEFT WRIST CARPAL TUNNEL RELEASE;  Surgeon: Iran Planas, MD;  Location: [place];  Service: Orthopedics;  Laterality: Left;   HEMORROIDECTOMY     KIDNEY STONE SURGERY     WRIST FUSION Left 11/06/2015   Procedure: ARTHRODESIS LEFT WRIST;  Surgeon: Iran Planas, MD;  Location: [place];  Service: Orthopedics;  Laterality: Left;     Medications Prior to Admission: Prior to Admission medications   Medication Sig Start Date End Date Taking? Authorizing Provider  albuterol (VENTOLIN HFA) 108 (90 Base) MCG/ACT inhaler Inhale 2 puffs into the lungs every 6 (six) hours as needed for wheezing. 02/19/21   Samuel Bouche, NP  amLODipine (NORVASC) 10 MG tablet One tablet by mouth every day for blood pressure control. 01/15/21 02/04/22  Breeback, Royetta Car, PA-C  atorvastatin (LIPITOR) 80 MG tablet Take 1 tablet (80 mg total) by mouth daily. 04/08/21   Samuel Bouche, NP  chlorthalidone (HYGROTON) 25 MG tablet Take 1 tablet (25 mg total) by mouth daily as needed. 02/19/21   Samuel Bouche, NP  cyclobenzaprine (FLEXERIL) 10 MG tablet Take 1 tablet (10 mg total) by mouth 3 (three) times daily as needed for muscle spasms. 01/15/21   Breeback, Jade L, PA-C  escitalopram (LEXAPRO)  10 MG tablet Take 1 tablet (10 mg total) by mouth daily. 10/09/20   Merian Capron, MD  fluticasone furoate-vilanterol (BREO ELLIPTA) 200-25 MCG/ACT AEPB Inhale 1 puff into the lungs daily. 02/19/21   Samuel Bouche, NP  loratadine (CLARITIN) 10 MG tablet Take 1 tablet (10 mg total) by mouth daily. 07/12/19   Emeterio Reeve, DO  methotrexate (RHEUMATREX) 2.5 MG tablet Take 20 mg by mouth every Tuesday. Caution:Chemotherapy. Protect from light.   Every other tuesday    [provider]  Nicotine 21-14-7 MG/24HR KIT Place 1  patch onto the skin daily. 04/03/21   Samuel Bouche, NP  omeprazole (PRILOSEC) 40 MG capsule Take 1 capsule (40 mg total) by mouth daily. 01/15/21   Breeback, Royetta Car, PA-C  oxyCODONE-acetaminophen (PERCOCET) 7.5-325 MG tablet Take 1 tablet by mouth 3 (three) times daily as needed. 07/10/19   [provider]  pregabalin (LYRICA) 75 MG capsule Take 1 capsule (75 mg total) by mouth 2 (two) times daily. 01/15/21   Donella Stade, PA-C  promethazine (PHENERGAN) 25 MG tablet Take 1 tablet (25 mg total) by mouth every 6 (six) hours as needed for nausea or vomiting. 07/12/19   Emeterio Reeve, DO  SUMAtriptan (IMITREX) 100 MG tablet Take 0.5 tablets (50 mg total) by mouth every 2 (two) hours as needed for migraine. May repeat in 2 hours if headache persists or recurs. 01/15/21   Breeback, Jade L, PA-C  valsartan-hydrochlorothiazide (DIOVAN-HCT) 160-25 MG tablet Take 1 tablet by mouth daily. 04/03/21   Samuel Bouche, NP     Allergies:    Allergies  Allergen Reactions   Lisinopril Cough   Benadryl [Diphenhydramine Hcl] Anxiety    Social History:   Social History   Socioeconomic History   Marital status: Married    Spouse name: Manuela Schwartz   Number of children: 4   Years of education: 9th grade   Highest education level: 9th grade  Occupational History    Comment: Legally disabled  Tobacco Use   Smoking status: Every Day    Packs/day: 1.00    Years: 30.00    Total pack years: 30.00    Types: Cigarettes   Smokeless tobacco: Former    Types: Chew    Quit date: 1988   Tobacco comments:    Rx written for Chantix from Dr. Sheppard Coil.  Vaping Use   Vaping Use: Never used  Substance and Sexual Activity   Alcohol use: No   Drug use: No   Sexual activity: Yes    Partners: Female  Other Topics Concern   Not on file  Social History Narrative   Lives with his family. He walks daily, four times a day with his dog. He enjoys fishing and camping.   Social Determinants of Health   Financial  Resource Strain: Low Risk  (06/17/2020)   Overall Financial Resource Strain (CARDIA)    Difficulty of Paying Living Expenses: Not hard at all  Food Insecurity: No Food Insecurity (06/17/2020)   Hunger Vital Sign    Worried About Running Out of Food in the Last Year: Never true    Ran Out of Food in the Last Year: Never true  Transportation Needs: No Transportation Needs (06/17/2020)   PRAPARE - Hydrologist (Medical): No    Lack of Transportation (Non-Medical): No  Physical Activity: Inactive (06/17/2020)   Exercise Vital Sign    Days of Exercise per Week: 0 days    Minutes of Exercise per Session: 0  min  Stress: No Stress Concern Present (06/17/2020)   [place]    Feeling of Stress : Not at all  Social Connections: Moderately Isolated (06/17/2020)   Social Connection and Isolation Panel [NHANES]    Frequency of Communication with Friends and Family: More than three times a week    Frequency of Social Gatherings with Friends and Family: More than three times a week    Attends Religious Services: Never    Marine scientist or Organizations: No    Attends Archivist Meetings: Never    Marital Status: Married  Human resources officer Violence: Not At Risk (06/17/2020)   Humiliation, Afraid, Rape, and Kick questionnaire    Fear of Current or Ex-Partner: No    Emotionally Abused: No    Physically Abused: No    Sexually Abused: No    Family History:   The patient's family history includes Alcohol abuse in his father; Cancer - Colon in his mother; Coronary artery disease in his brother, father, and mother; Diabetes Mellitus II in his father and mother; Heart attack in his brother; Heart attack (age of onset: 46) in his mother; Heart attack (age of onset: 24) in his father; Heart murmur in his sister; Hypertension in his father; Ovarian cancer in his mother; Uterine cancer in his mother. There  is no history of Dementia, Bipolar disorder, Depression, Drug abuse, or OCD.    ROS:  Please see the history of present illness.  All other ROS reviewed and negative.     Physical Exam/Data:  There were no vitals filed for this visit. No intake or output data in the 24 hours ending 08/08/21 1118    04/03/2021    8:41 AM 04/01/2021    5:24 PM 03/25/2021   11:00 AM  Last 3 Weights  Weight (lbs) 247 lb 12.8 oz 234 lb 234 lb  Weight (kg) 112.401 kg 106.142 kg 106.142 kg     There is no height or weight on file to calculate BMI.  General:  Well nourished, well developed, in no acute distress HEENT: normal Neck: no JVD Vascular: No carotid bruits; Distal pulses 2+ bilaterally   Cardiac:  normal S1, S2; RRR; no murmur  Lungs:  clear to auscultation bilaterally, no wheezing, rhonchi or rales  Abd: soft, nontender, no hepatomegaly  Ext: no edema Musculoskeletal:  No deformities, BUE and BLE strength normal and equal Skin: warm and dry  Neuro:  CNs 2-12 intact, no focal abnormalities noted Psych:  Normal affect    EKG:  The ECG that was done today was personally reviewed and demonstrates sinus rhythm, anterior lateral ST elevation  Relevant CV Studies:  Echo 04/28/2017 Study Conclusions   - Left ventricle: The cavity size was normal. Systolic function was    normal. The estimated ejection fraction was in the range of 55%    to 60%. Wall motion was normal; there were no regional wall    motion abnormalities. Doppler parameters are consistent with    abnormal left ventricular relaxation (grade 1 diastolic    dysfunction).   Impressions:   - Normal echocardiongram,   Laboratory Data:  High Sensitivity Troponin:  No results for input(s): "TROPONINIHS" in the last 720 hours.    ChemistryNo results for input(s): "NA", "K", "CL", "CO2", "GLUCOSE", "BUN", "CREATININE", "CALCIUM", "MG", "GFRNONAA", "GFRAA", "ANIONGAP" in the last 168 hours.  No results for input(s): "PROT", "ALBUMIN",  "AST", "ALT", "ALKPHOS", "BILITOT" in the last 168  hours. Lipids No results for input(s): "CHOL", "TRIG", "HDL", "LABVLDL", "LDLCALC", "CHOLHDL" in the last 168 hours. HematologyNo results for input(s): "WBC", "RBC", "HGB", "HCT", "MCV", "MCH", "MCHC", "RDW", "PLT" in the last 168 hours. Thyroid No results for input(s): "TSH", "FREET4" in the last 168 hours. BNPNo results for input(s): "BNP", "PROBNP" in the last 168 hours.  DDimer No results for input(s): "DDIMER" in the last 168 hours.   Radiology/Studies:  No results found.   Assessment and Plan:   Anterior lateral ST elevation -2 to 3 days history of intermittent pain, worse today.  10 out of 10 squeezing chest pressure radiating to his jaw and left shoulder and cardiac catheterization lab.  Pending emergent cardiac catheterization.  Significant cardiac risk factor including hypertension, hyperlipidemia, tobacco smoking and family history.   Risk Assessment/Risk Scores:    TIMI Risk Score for ST  Elevation MI:   The patient's TIMI risk score is 2, which indicates a 2.2% risk of all cause mortality at 30 days.{   Severity of Illness: The appropriate patient status for this patient is INPATIENT. Inpatient status is judged to be reasonable and necessary in order to provide the required intensity of service to ensure the patient's safety. The patient's presenting symptoms, physical exam findings, and initial radiographic and laboratory data in the context of their chronic comorbidities is felt to place them at high risk for further clinical deterioration. Furthermore, it is not anticipated that the patient will be medically stable for discharge from the hospital within 2 midnights of admission.   * I certify that at the point of admission it is my clinical judgment that the patient will require inpatient hospital care spanning beyond 2 midnights from the point of admission due to high intensity of service, high risk for further  deterioration and high frequency of surveillance required.*   For questions or updates, please contact [place] Please consult [email or web address] for contact info under     SignedLeanor Kail, PA  08/08/2021 11:18 AM    I have examined the patient and reviewed assessment and plan and discussed with patient.  Agree with above as stated.    I personally reviewed the ECG and made the decision for the patient to come emergently for cardiac cath.  He was still having severe chest discomfort upon arrival to the Cath Lab.  Mid RCA lesion is 90% stenosed.   1st Mrg lesion is 50% stenosed.   Mid LAD lesion is 100% stenosed.  Culprit lesion for anterior STEMI.   A drug-eluting stent was successfully placed using a SYNERGY XD 2.75X16, postdilated to 3.25 mm and optimized with intravascular ultrasound..   Post intervention, there is a 0% residual stenosis.   Mild, diffuse coronary disease.   Successful PCI of an occluded LAD causing anterior ST elevation MI.  He will need to be watched in the ICU.  Dual antiplatelet therapy with aspirin and Brilinta.  High-dose atorvastatin.  We will check lipids and A1c.  Plan for PCI of the RCA on Monday.   Results discussed with the patient's wife and son.    Larae Grooms

## 2021-08-08 NOTE — Progress Notes (Signed)
RT placed pt on CPAP at this time. Pt tolerating settings well. RT will monitor as needed.

## 2021-08-09 DIAGNOSIS — I2109 ST elevation (STEMI) myocardial infarction involving other coronary artery of anterior wall: Secondary | ICD-10-CM

## 2021-08-09 LAB — CBC
HCT: 43.2 % (ref 39.0–52.0)
Hemoglobin: 15.1 g/dL (ref 13.0–17.0)
MCH: 30.3 pg (ref 26.0–34.0)
MCHC: 35 g/dL (ref 30.0–36.0)
MCV: 86.6 fL (ref 80.0–100.0)
Platelets: 332 10*3/uL (ref 150–400)
RBC: 4.99 MIL/uL (ref 4.22–5.81)
RDW: 13.4 % (ref 11.5–15.5)
WBC: 11 10*3/uL — ABNORMAL HIGH (ref 4.0–10.5)
nRBC: 0 % (ref 0.0–0.2)

## 2021-08-09 LAB — LIPID PANEL
Cholesterol: 186 mg/dL (ref 0–200)
HDL: 31 mg/dL — ABNORMAL LOW (ref >40)
LDL Cholesterol: 120 mg/dL — ABNORMAL HIGH (ref 0–99)
Total CHOL/HDL Ratio: 6 RATIO
Triglycerides: 175 mg/dL — ABNORMAL HIGH (ref <150)
VLDL: 35 mg/dL (ref 0–40)

## 2021-08-09 LAB — BASIC METABOLIC PANEL
Anion gap: 10 (ref 5–15)
BUN: 9 mg/dL (ref 6–20)
CO2: 23 mmol/L (ref 22–32)
Calcium: 8.9 mg/dL (ref 8.9–10.3)
Chloride: 101 mmol/L (ref 98–111)
Creatinine, Ser: 0.92 mg/dL (ref 0.61–1.24)
GFR, Estimated: >60 mL/min (ref >60)
Glucose, Bld: 244 mg/dL — ABNORMAL HIGH (ref 70–99)
Potassium: 4.3 mmol/L (ref 3.5–5.1)
Sodium: 134 mmol/L — ABNORMAL LOW (ref 135–145)

## 2021-08-09 LAB — TROPONIN I (HIGH SENSITIVITY): Troponin I (High Sensitivity): 7136 ng/L (ref <18)

## 2021-08-09 LAB — GLUCOSE, CAPILLARY: Glucose-Capillary: 198 mg/dL — ABNORMAL HIGH (ref 70–99)

## 2021-08-09 LAB — HEMOGLOBIN A1C
Hgb A1c MFr Bld: 6.8 % — ABNORMAL HIGH (ref 4.8–5.6)
Mean Plasma Glucose: 148.46 mg/dL

## 2021-08-09 MED ORDER — CHLORHEXIDINE GLUCONATE CLOTH 2 % EX PADS
6.0000 | MEDICATED_PAD | Freq: Every day | CUTANEOUS | Status: DC
  Administered 2021-08-09 – 2021-08-10 (×2): 6 via TOPICAL

## 2021-08-09 NOTE — Progress Notes (Signed)
Progress Note  Patient Name: [name] Date of Encounter: 08/09/2021  Primary Cardiologist: None   Subjective   Denies chest pain or shortness of breath.  Status post PCI LAD  Inpatient Medications    Scheduled Meds:  aspirin  81 mg Oral Daily   atorvastatin  80 mg Oral Daily   escitalopram  10 mg Oral QHS   fluticasone furoate-vilanterol  1 puff Inhalation Daily   loratadine  10 mg Oral Daily   [START ON 08/12/2021] methotrexate  20 mg Oral Q Tue   metoprolol tartrate  12.5 mg Oral BID   nicotine  21 mg Transdermal Daily   pantoprazole  40 mg Oral Daily   pregabalin  75 mg Oral BID   sodium chloride flush  3 mL Intravenous Q12H   ticagrelor  90 mg Oral BID   Continuous Infusions:  sodium chloride     PRN Meds: sodium chloride, acetaminophen, albuterol, cyclobenzaprine, ondansetron (ZOFRAN) IV, mouth rinse, oxyCODONE-acetaminophen **AND** oxyCODONE, promethazine, sodium chloride flush   Vital Signs    Vitals:   08/09/21 0500 08/09/21 0600 08/09/21 0700 08/09/21 0800  BP: (!) 154/100 (!) 148/90 (!) 156/97 (!) 154/93  Pulse: 65 72 72 68  Resp: 18 19 (!) 22 16  Temp:   98.6 F (37 C)   TempSrc:   Oral   SpO2: 91% (!) 89% (!) 86% 91%  Weight:        Intake/Output Summary (Last 24 hours) at 08/09/2021 0932 Last data filed at 08/08/2021 2145 Gross per 24 hour  Intake 741.18 ml  Output 1200 ml  Net -458.82 ml   Filed Weights   08/08/21 1100  Weight: 109 kg    Telemetry    Normal sinus rhythm- Personally Reviewed  ECG    Normal sinus rhythm, minimally elevated anterolateral ST-T segments- Personally Reviewed  Physical Exam   GEN: No acute distress.   Neck: No JVD Cardiac: RRR, no murmurs, rubs, or gallops.  Respiratory: Clear to auscultation bilaterally. GI: Soft, nontender, non-distended  MS: No edema; No deformity.,  Right wrist with no bleeding or swelling Neuro:  Nonfocal  Psych: Normal affect   Labs    Chemistry Recent Labs  Lab  08/08/21 1116 08/08/21 1139 08/09/21 0634  NA 137 135 134*  K 3.7 3.7 4.3  CL 103 102 101  CO2  --  19* 23  GLUCOSE 220* 216* 244*  BUN 9 9 9   CREATININE 0.90 1.00 0.92  CALCIUM  --  9.0 8.9  PROT  --  6.8  --   ALBUMIN  --  3.6  --   AST  --  41  --   ALT  --  40  --   ALKPHOS  --  46  --   BILITOT  --  0.8  --   GFRNONAA  --  >60 >60  ANIONGAP  --  14 10     Hematology Recent Labs  Lab 08/08/21 1116 08/08/21 1139 08/09/21 0634  WBC  --  11.0* 11.0*  RBC  --  5.22 4.99  HGB 15.6 15.5 15.1  HCT 46.0 44.8 43.2  MCV  --  85.8 86.6  MCH  --  29.7 30.3  MCHC  --  34.6 35.0  RDW  --  13.2 13.4  PLT  --  397 332    Cardiac EnzymesNo results for input(s): "TROPONINI" in the last 168 hours. No results for input(s): "TROPIPOC" in the last 168 hours.   BNPNo  results for input(s): "BNP", "PROBNP" in the last 168 hours.   DDimer No results for input(s): "DDIMER" in the last 168 hours.   Radiology    CARDIAC CATHETERIZATION  Result Date: 08/08/2021   Mid RCA lesion is 90% stenosed.   1st Mrg lesion is 50% stenosed.   Mid LAD lesion is 100% stenosed.  Culprit lesion for anterior STEMI.   A drug-eluting stent was successfully placed using a SYNERGY XD 2.75X16, postdilated to 3.25 mm and optimized with intravascular ultrasound..   Post intervention, there is a 0% residual stenosis.   Mild, diffuse coronary disease. Successful PCI of an occluded LAD causing anterior ST elevation MI.  He will need to be watched in the ICU.  Dual antiplatelet therapy with aspirin and Brilinta.  High-dose atorvastatin.  We will check lipids and A1c.  Plan for PCI of the RCA on Monday. Results discussed with the patient's wife and son.    Cardiac Studies   Cardiac cath data reviewed as above  Patient Profile     51 y.o. male admitted with anterior STEMI and also found to have high-grade RCA stenosis  Assessment & Plan    1.  Anterior ST elevation myocardial infarction -the patient is status  post stenting of the LAD with good result and is currently pain-free.  He will continue aspirin and Brilinta. 2.  Severe two-vessel coronary disease -the patient has 90% RCA disease and will undergo staged PCI of the RCA on Monday, in 2 days. 3.  Tobacco abuse -I discussed the importance of smoking cessation. 4.  Dyslipidemia -he has been started on high intensity statin therapy.  Continue.  For questions or updates, please contact [facility] Please consult [email or web address] for contact info under Cardiology/STEMI.      Signed, Lewayne Bunting, MD  08/09/2021, 9:32 AM

## 2021-08-09 NOTE — Progress Notes (Signed)
CARDIAC REHAB PHASE I   PRE:  Rate/Rhythm: 73 SR    BP: sitting 143/92    SaO2: 93 RA  MODE:  Ambulation: 370 ft   POST:  Rate/Rhythm: 82 SR    BP: sitting 145/90     SaO2: 90 RA   Pt with chest tightness after 300 ft, resolved with rest and resumed walking and to recliner. SaO2 90 RA however pt denied SOB.  During education pt suddenly c/o dizziness and nausea. When asked, sts he has off and on squeezing sensation in chest but does not sound significant. BP 147/99, retake 141/92. RN came in and resumed care. CBG 199 per RN. I had just told pt of his A1C being elevated. He drinks significant amounts of sugar and he did seem overwhelmed by the thought of change.   Discussed with pt and wife MI, stent, importance of Brilinta, restrictions, A1C and decreasing sugars. Gave him tobacco cessation sheet however he was not ready for official education. Will f/u Monday. Pt would benefit from DM c/s. [id number]  Ethelda Chick CES, ACSM 08/09/2021 1:20 PM

## 2021-08-10 ENCOUNTER — Encounter (HOSPITAL_COMMUNITY): Payer: Self-pay | Admitting: Interventional Cardiology

## 2021-08-10 LAB — LIPOPROTEIN A (LPA): Lipoprotein (a): 12.8 nmol/L (ref <75.0)

## 2021-08-10 MED ORDER — SODIUM CHLORIDE 0.9% FLUSH: 3.0000 mL | INTRAVENOUS | Status: DC | PRN

## 2021-08-10 MED ORDER — ASPIRIN 81 MG PO CHEW
81.0000 mg | CHEWABLE_TABLET | ORAL | Status: AC
  Administered 2021-08-11: 81 mg via ORAL
  Filled 2021-08-10: qty 1

## 2021-08-10 MED ORDER — SODIUM CHLORIDE 0.9 % WEIGHT BASED INFUSION
1.0000 mL/kg/h | INTRAVENOUS | Status: DC
  Administered 2021-08-11: 1 mL/kg/h via INTRAVENOUS

## 2021-08-10 MED ORDER — SODIUM CHLORIDE 0.9 % IV SOLN: 250.0000 mL | INTRAVENOUS | Status: DC | PRN

## 2021-08-10 MED ORDER — SODIUM CHLORIDE 0.9 % WEIGHT BASED INFUSION: 3.0000 mL/kg/h | INTRAVENOUS | Status: AC

## 2021-08-10 MED ORDER — SODIUM CHLORIDE 0.9% FLUSH
3.0000 mL | Freq: Two times a day (BID) | INTRAVENOUS | Status: DC
  Administered 2021-08-10: 3 mL via INTRAVENOUS

## 2021-08-10 NOTE — Progress Notes (Signed)
Pt places self on/off cpap when ready.  RT will cont to monitor. 

## 2021-08-10 NOTE — H&P (View-Only) (Signed)
Progress Note  Patient Name: [name] Date of Encounter: 08/10/2021  Primary Cardiologist: None   Subjective   No chest pain or sob.  Inpatient Medications    Scheduled Meds:  aspirin  81 mg Oral Daily   atorvastatin  80 mg Oral Daily   Chlorhexidine Gluconate Cloth  6 each Topical Daily   escitalopram  10 mg Oral QHS   fluticasone furoate-vilanterol  1 puff Inhalation Daily   loratadine  10 mg Oral Daily   [START ON 08/12/2021] methotrexate  20 mg Oral Q Tue   metoprolol tartrate  12.5 mg Oral BID   nicotine  21 mg Transdermal Daily   pantoprazole  40 mg Oral Daily   pregabalin  75 mg Oral BID   sodium chloride flush  3 mL Intravenous Q12H   ticagrelor  90 mg Oral BID   Continuous Infusions:  sodium chloride     PRN Meds: sodium chloride, acetaminophen, albuterol, cyclobenzaprine, ondansetron (ZOFRAN) IV, mouth rinse, oxyCODONE-acetaminophen **AND** oxyCODONE, promethazine, sodium chloride flush   Vital Signs    Vitals:   08/10/21 0500 08/10/21 0600 08/10/21 0700 08/10/21 0809  BP: (!) 138/93 (!) 125/91 125/72 137/87  Pulse: 67 65 72 72  Resp: 14 15 16  (!) 22  Temp:   98.1 F (36.7 C)   TempSrc:      SpO2: 91% 91% 92% 93%  Weight:        Intake/Output Summary (Last 24 hours) at 08/10/2021 1031 Last data filed at 08/10/2021 0600 Gross per 24 hour  Intake 360 ml  Output 2100 ml  Net -1740 ml   Filed Weights   08/08/21 1100  Weight: 109 kg    Telemetry    nsr - Personally Reviewed  ECG    none - Personally Reviewed  Physical Exam   GEN: No acute distress.   Neck: No JVD Cardiac: RRR, no murmurs, rubs, or gallops.  Respiratory: Clear to auscultation bilaterally. GI: Soft, nontender, non-distended  MS: No edema; No deformity. Neuro:  Nonfocal  Psych: Normal affect   Labs    Chemistry Recent Labs  Lab 08/08/21 1116 08/08/21 1139 08/09/21 0634  NA 137 135 134*  K 3.7 3.7 4.3  CL 103 102 101  CO2  --  19* 23  GLUCOSE 220* 216*  244*  BUN 9 9 9   CREATININE 0.90 1.00 0.92  CALCIUM  --  9.0 8.9  PROT  --  6.8  --   ALBUMIN  --  3.6  --   AST  --  41  --   ALT  --  40  --   ALKPHOS  --  46  --   BILITOT  --  0.8  --   GFRNONAA  --  >60 >60  ANIONGAP  --  14 10     Hematology Recent Labs  Lab 08/08/21 1116 08/08/21 1139 08/09/21 0634  WBC  --  11.0* 11.0*  RBC  --  5.22 4.99  HGB 15.6 15.5 15.1  HCT 46.0 44.8 43.2  MCV  --  85.8 86.6  MCH  --  29.7 30.3  MCHC  --  34.6 35.0  RDW  --  13.2 13.4  PLT  --  397 332    Cardiac EnzymesNo results for input(s): "TROPONINI" in the last 168 hours. No results for input(s): "TROPIPOC" in the last 168 hours.   BNPNo results for input(s): "BNP", "PROBNP" in the last 168 hours.   DDimer No results for  input(s): "DDIMER" in the last 168 hours.   Radiology    CARDIAC CATHETERIZATION  Result Date: 08/08/2021   Mid RCA lesion is 90% stenosed.   1st Mrg lesion is 50% stenosed.   Mid LAD lesion is 100% stenosed.  Culprit lesion for anterior STEMI.   A drug-eluting stent was successfully placed using a SYNERGY XD 2.75X16, postdilated to 3.25 mm and optimized with intravascular ultrasound..   Post intervention, there is a 0% residual stenosis.   Mild, diffuse coronary disease. Successful PCI of an occluded LAD causing anterior ST elevation MI.  He will need to be watched in the ICU.  Dual antiplatelet therapy with aspirin and Brilinta.  High-dose atorvastatin.  We will check lipids and A1c.  Plan for PCI of the RCA on Monday. Results discussed with the patient's wife and son.    Cardiac Studies   Cardiac cath as above noted  Patient Profile     51 y.o. male admitted with anterior STEMI and 90% RCA stenosis.  Assessment & Plan    Anterior STEMI - he is doing well s/p PCI High grade RCA disease - he will undergo PCI tomorrow.  Tobacco abuse - He is stable on a nicotine patch. I discussed the improtance of smoking cessation.   For questions or updates, please  contact [facility] Please consult [email or web address] for contact info under Cardiology/STEMI.      Signed, Lewayne Bunting, MD  08/10/2021, 10:31 AM

## 2021-08-10 NOTE — Inpatient Diabetes Management (Signed)
Inpatient Diabetes Program Recommendations  AACE/ADA: New Consensus Statement on Inpatient Glycemic Control (2015)  Target Ranges:  Prepandial:   less than 140 mg/dL      Peak postprandial:   less than 180 mg/dL (1-2 hours)      Critically ill patients:  140 - 180 mg/dL   Lab Results  Component Value Date   GLUCAP 198 (H) 08/09/2021   HGBA1C 6.8 (H) 08/09/2021    Review of Glycemic Control  Latest Reference Range & Units 08/09/21 13:19  Glucose-Capillary 70 - 99 mg/dL 198 (H)   Diabetes history: New diagnosis this admission Outpatient Diabetes medications: None Current orders for Inpatient glycemic control:  None  Inpatient Diabetes Program Recommendations:    - Consider initiating SGLT 2 based on elevated glucose trends.  Spoke with patient about new diabetes diagnosis.  Discussed A1C results (6.8%) and explained what an A1C is. Discussed basic pathophysiology of DM Type 2, basic home care, importance of checking CBGs and maintaining good CBG control to prevent long-term and short-term complications. Reviewed glucose and A1C goals.  Reviewed signs and symptoms of hyperglycemia and hypoglycemia along with treatment for both. Discussed impact of nutrition, exercise, stress, sickness, and medications on diabetes control. Spoke with pt regarding possibly starting an SGLT-2 for glucose control at home. Discussed possible side effects. Discussed possibly checking glucose trends at home with a glucometer.  Needs benefit check on SGLT 2 (Invokana, Farxiga, Jardiance)  D/c needs:  Glucose meter kit order # [id number]   Thanks,  Tama Headings RN, MSN, BC-ADM Inpatient Diabetes Coordinator Team Pager [phone] (8a-5p)

## 2021-08-10 NOTE — Progress Notes (Signed)
 Progress Note  Patient Name: [name] Date of Encounter: 08/10/2021  Primary Cardiologist: None   Subjective   No chest pain or sob.  Inpatient Medications    Scheduled Meds:  aspirin  81 mg Oral Daily   atorvastatin  80 mg Oral Daily   Chlorhexidine Gluconate Cloth  6 each Topical Daily   escitalopram  10 mg Oral QHS   fluticasone furoate-vilanterol  1 puff Inhalation Daily   loratadine  10 mg Oral Daily   [START ON 08/12/2021] methotrexate  20 mg Oral Q Tue   metoprolol tartrate  12.5 mg Oral BID   nicotine  21 mg Transdermal Daily   pantoprazole  40 mg Oral Daily   pregabalin  75 mg Oral BID   sodium chloride flush  3 mL Intravenous Q12H   ticagrelor  90 mg Oral BID   Continuous Infusions:  sodium chloride     PRN Meds: sodium chloride, acetaminophen, albuterol, cyclobenzaprine, ondansetron (ZOFRAN) IV, mouth rinse, oxyCODONE-acetaminophen **AND** oxyCODONE, promethazine, sodium chloride flush   Vital Signs    Vitals:   08/10/21 0500 08/10/21 0600 08/10/21 0700 08/10/21 0809  BP: (!) 138/93 (!) 125/91 125/72 137/87  Pulse: 67 65 72 72  Resp: 14 15 16 (!) 22  Temp:   98.1 F (36.7 C)   TempSrc:      SpO2: 91% 91% 92% 93%  Weight:        Intake/Output Summary (Last 24 hours) at 08/10/2021 1031 Last data filed at 08/10/2021 0600 Gross per 24 hour  Intake 360 ml  Output 2100 ml  Net -1740 ml   Filed Weights   08/08/21 1100  Weight: 109 kg    Telemetry    nsr - Personally Reviewed  ECG    none - Personally Reviewed  Physical Exam   GEN: No acute distress.   Neck: No JVD Cardiac: RRR, no murmurs, rubs, or gallops.  Respiratory: Clear to auscultation bilaterally. GI: Soft, nontender, non-distended  MS: No edema; No deformity. Neuro:  Nonfocal  Psych: Normal affect   Labs    Chemistry Recent Labs  Lab 08/08/21 1116 08/08/21 1139 08/09/21 0634  NA 137 135 134*  K 3.7 3.7 4.3  CL 103 102 101  CO2  --  19* 23  GLUCOSE 220* 216*  244*  BUN 9 9 9  CREATININE 0.90 1.00 0.92  CALCIUM  --  9.0 8.9  PROT  --  6.8  --   ALBUMIN  --  3.6  --   AST  --  41  --   ALT  --  40  --   ALKPHOS  --  46  --   BILITOT  --  0.8  --   GFRNONAA  --  >60 >60  ANIONGAP  --  14 10     Hematology Recent Labs  Lab 08/08/21 1116 08/08/21 1139 08/09/21 0634  WBC  --  11.0* 11.0*  RBC  --  5.22 4.99  HGB 15.6 15.5 15.1  HCT 46.0 44.8 43.2  MCV  --  85.8 86.6  MCH  --  29.7 30.3  MCHC  --  34.6 35.0  RDW  --  13.2 13.4  PLT  --  397 332    Cardiac EnzymesNo results for input(s): "TROPONINI" in the last 168 hours. No results for input(s): "TROPIPOC" in the last 168 hours.   BNPNo results for input(s): "BNP", "PROBNP" in the last 168 hours.   DDimer No results for   input(s): "DDIMER" in the last 168 hours.   Radiology    CARDIAC CATHETERIZATION  Result Date: 08/08/2021   Mid RCA lesion is 90% stenosed.   1st Mrg lesion is 50% stenosed.   Mid LAD lesion is 100% stenosed.  Culprit lesion for anterior STEMI.   A drug-eluting stent was successfully placed using a SYNERGY XD [id number], postdilated to 3.25 mm and optimized with intravascular ultrasound..   Post intervention, there is a 0% residual stenosis.   Mild, diffuse coronary disease. Successful PCI of an occluded LAD causing anterior ST elevation MI.  He will need to be watched in the ICU.  Dual antiplatelet therapy with aspirin and Brilinta.  High-dose atorvastatin.  We will check lipids and A1c.  Plan for PCI of the RCA on Monday. Results discussed with the patient's wife and son.    Cardiac Studies   Cardiac cath as above noted  Patient Profile     51 y.o. male admitted with anterior STEMI and 90% RCA stenosis.  Assessment & Plan    Anterior STEMI - he is doing well s/p PCI High grade RCA disease - he will undergo PCI tomorrow.  Tobacco abuse - He is stable on a nicotine patch. I discussed the improtance of smoking cessation.   For questions or updates, please  contact [facility] Please consult [email or web address] for contact info under Cardiology/STEMI.      Signed, Lewayne Bunting, MD  08/10/2021, 10:31 AM

## 2021-08-10 NOTE — Progress Notes (Signed)
Patient has CPAP HS at beside. Patient is able to place on and off when he is ready.

## 2021-08-11 ENCOUNTER — Encounter (HOSPITAL_COMMUNITY)
Admission: EM | Disposition: A | Discharge status: Home / Self Care | Payer: Self-pay | Source: Home / Self Care | Attending: Interventional Cardiology

## 2021-08-11 ENCOUNTER — Telehealth (HOSPITAL_COMMUNITY): Payer: Self-pay | Admitting: Pharmacy Technician

## 2021-08-11 ENCOUNTER — Encounter (HOSPITAL_COMMUNITY): Payer: Self-pay | Admitting: Interventional Cardiology

## 2021-08-11 ENCOUNTER — Inpatient Hospital Stay (HOSPITAL_COMMUNITY): Payer: Medicare Other

## 2021-08-11 ENCOUNTER — Other Ambulatory Visit (HOSPITAL_COMMUNITY): Payer: Self-pay

## 2021-08-11 DIAGNOSIS — R079 Chest pain, unspecified: Secondary | ICD-10-CM

## 2021-08-11 DIAGNOSIS — I25118 Atherosclerotic heart disease of native coronary artery with other forms of angina pectoris: Secondary | ICD-10-CM

## 2021-08-11 HISTORY — PX: CORONARY STENT INTERVENTION: CATH118234

## 2021-08-11 HISTORY — PX: LEFT HEART CATH AND CORONARY ANGIOGRAPHY: CATH118249

## 2021-08-11 LAB — ECHOCARDIOGRAM COMPLETE
AR max vel: 3.25 cm2
AV Area VTI: 3.64 cm2
AV Area mean vel: 2.9 cm2
AV Mean grad: 3 mmHg
AV Peak grad: 6.8 mmHg
Ao pk vel: 1.3 m/s
Area-P 1/2: 2.55 cm2
Calc EF: 59.2 %
MV VTI: 3.54 cm2
S' Lateral: 3.2 cm
Single Plane A2C EF: 66.6 %
Single Plane A4C EF: 49.5 %
Weight: 3844.82 oz

## 2021-08-11 LAB — GLUCOSE, CAPILLARY: Glucose-Capillary: 160 mg/dL — ABNORMAL HIGH (ref 70–99)

## 2021-08-11 LAB — POCT ACTIVATED CLOTTING TIME
Activated Clotting Time: 486 seconds
Activated Clotting Time: 474 seconds

## 2021-08-11 SURGERY — CORONARY STENT INTERVENTION
Anesthesia: LOCAL

## 2021-08-11 MED ORDER — MIDAZOLAM HCL 2 MG/2ML IJ SOLN
INTRAMUSCULAR | Status: DC | PRN
  Administered 2021-08-11: 2 mg via INTRAVENOUS

## 2021-08-11 MED ORDER — VERAPAMIL HCL 2.5 MG/ML IV SOLN
INTRAVENOUS | Status: AC
  Filled 2021-08-11: qty 2

## 2021-08-11 MED ORDER — FENTANYL CITRATE (PF) 100 MCG/2ML IJ SOLN
INTRAMUSCULAR | Status: DC | PRN
Start: 2021-08-11 — End: 2021-08-11
  Administered 2021-08-11: 25 ug via INTRAVENOUS
  Administered 2021-08-11: 50 ug via INTRAVENOUS

## 2021-08-11 MED ORDER — HEPARIN (PORCINE) IN NACL 1000-0.9 UT/500ML-% IV SOLN
INTRAVENOUS | Status: DC | PRN
  Administered 2021-08-11 (×2): 500 mL

## 2021-08-11 MED ORDER — LABETALOL HCL 5 MG/ML IV SOLN: 10.0000 mg | INTRAVENOUS | Status: DC | PRN

## 2021-08-11 MED ORDER — NITROGLYCERIN 0.4 MG SL SUBL
0.4000 mg | SUBLINGUAL_TABLET | SUBLINGUAL | 2 refills | Status: DC | PRN
  Filled 2021-08-11: qty 25, 7d supply, fill #0

## 2021-08-11 MED ORDER — TICAGRELOR 90 MG PO TABS: 90.0000 mg | ORAL_TABLET | Freq: Two times a day (BID) | ORAL | Status: DC

## 2021-08-11 MED ORDER — ONDANSETRON HCL 4 MG/2ML IJ SOLN: 4.0000 mg | Freq: Four times a day (QID) | INTRAMUSCULAR | Status: DC | PRN

## 2021-08-11 MED ORDER — HEPARIN (PORCINE) IN NACL 1000-0.9 UT/500ML-% IV SOLN
INTRAVENOUS | Status: AC
  Filled 2021-08-11: qty 500

## 2021-08-11 MED ORDER — SODIUM CHLORIDE 0.9% FLUSH
3.0000 mL | INTRAVENOUS | Status: DC | PRN
Start: 2021-08-11 — End: 2021-08-11

## 2021-08-11 MED ORDER — HEPARIN SODIUM (PORCINE) 1000 UNIT/ML IJ SOLN
INTRAMUSCULAR | Status: DC | PRN
  Administered 2021-08-11: 11000 [IU] via INTRAVENOUS

## 2021-08-11 MED ORDER — VERAPAMIL HCL 2.5 MG/ML IV SOLN
INTRAVENOUS | Status: DC | PRN
  Administered 2021-08-11: 10 mL via INTRA_ARTERIAL

## 2021-08-11 MED ORDER — SODIUM CHLORIDE 0.9 % IV SOLN
INTRAVENOUS | Status: DC
Start: 2021-08-11 — End: 2021-08-11

## 2021-08-11 MED ORDER — LIDOCAINE HCL (PF) 1 % IJ SOLN
INTRAMUSCULAR | Status: DC | PRN
  Administered 2021-08-11: 2 mL

## 2021-08-11 MED ORDER — ASPIRIN 81 MG PO CHEW: 81.0000 mg | CHEWABLE_TABLET | Freq: Every day | ORAL | Status: DC

## 2021-08-11 MED ORDER — IOHEXOL 350 MG/ML SOLN
INTRAVENOUS | Status: DC | PRN
  Administered 2021-08-11: 65 mL

## 2021-08-11 MED ORDER — TICAGRELOR 90 MG PO TABS
90.0000 mg | ORAL_TABLET | Freq: Two times a day (BID) | ORAL | 2 refills | Status: DC
  Filled 2021-08-11: qty 180, 90d supply, fill #0

## 2021-08-11 MED ORDER — LIDOCAINE HCL (PF) 1 % IJ SOLN
INTRAMUSCULAR | Status: AC
  Filled 2021-08-11: qty 30

## 2021-08-11 MED ORDER — ACETAMINOPHEN 325 MG PO TABS
650.0000 mg | ORAL_TABLET | ORAL | Status: DC | PRN
Start: 2021-08-11 — End: 2021-08-11

## 2021-08-11 MED ORDER — SODIUM CHLORIDE 0.9% FLUSH
3.0000 mL | Freq: Two times a day (BID) | INTRAVENOUS | Status: DC
Start: 2021-08-11 — End: 2021-08-11

## 2021-08-11 MED ORDER — NITROGLYCERIN 1 MG/10 ML FOR IR/CATH LAB
INTRA_ARTERIAL | Status: DC | PRN
  Administered 2021-08-11: 200 ug via INTRACORONARY
  Administered 2021-08-11: 100 ug via INTRACORONARY
  Administered 2021-08-11: 400 ug via INTRA_ARTERIAL

## 2021-08-11 MED ORDER — METOPROLOL TARTRATE 25 MG PO TABS
12.5000 mg | ORAL_TABLET | Freq: Two times a day (BID) | ORAL | 0 refills | Status: DC
  Filled 2021-08-11: qty 100, 100d supply, fill #0

## 2021-08-11 MED ORDER — FENTANYL CITRATE (PF) 100 MCG/2ML IJ SOLN
INTRAMUSCULAR | Status: AC
  Filled 2021-08-11: qty 2

## 2021-08-11 MED ORDER — ASPIRIN 81 MG PO CHEW
81.0000 mg | CHEWABLE_TABLET | Freq: Every day | ORAL | 2 refills | Status: AC
  Filled 2021-08-11: qty 90, 90d supply, fill #0

## 2021-08-11 MED ORDER — SODIUM CHLORIDE 0.9 % IV SOLN
250.0000 mL | INTRAVENOUS | Status: DC | PRN
Start: 2021-08-11 — End: 2021-08-11

## 2021-08-11 MED ORDER — HYDRALAZINE HCL 20 MG/ML IJ SOLN: 10.0000 mg | INTRAMUSCULAR | Status: DC | PRN

## 2021-08-11 MED ORDER — NITROGLYCERIN 1 MG/10 ML FOR IR/CATH LAB
INTRA_ARTERIAL | Status: AC
  Filled 2021-08-11: qty 10

## 2021-08-11 MED ORDER — HEPARIN SODIUM (PORCINE) 1000 UNIT/ML IJ SOLN
INTRAMUSCULAR | Status: AC
  Filled 2021-08-11: qty 10

## 2021-08-11 MED ORDER — MIDAZOLAM HCL 2 MG/2ML IJ SOLN
INTRAMUSCULAR | Status: AC
  Filled 2021-08-11: qty 2

## 2021-08-11 MED FILL — Tirofiban HCl in NaCl 0.9% IV Soln 5 MG/100ML (Base Equiv): INTRAVENOUS | Qty: 100 | Status: AC

## 2021-08-11 MED FILL — Nitroglycerin IV Soln 100 MCG/ML in D5W: INTRA_ARTERIAL | Qty: 10 | Status: AC

## 2021-08-11 SURGICAL SUPPLY — 22 items
BALL SAPPHIRE NC24 3.0X15 (BALLOONS) ×2
BALLN SAPPHIRE 2.0X12 (BALLOONS) ×2
BALLOON SAPPHIRE 2.0X12 (BALLOONS) IMPLANT
BALLOON SAPPHIRE NC24 3.0X15 (BALLOONS) IMPLANT
BAND CMPR LRG ZPHR (HEMOSTASIS) ×1
BAND ZEPHYR COMPRESS 30 LONG (HEMOSTASIS) ×1 IMPLANT
CATH INFINITI 5 FR JL3.5 (CATHETERS) ×1 IMPLANT
CATH LAUNCHER 6FR JR4 (CATHETERS) ×1 IMPLANT
ELECT DEFIB PAD ADLT CADENCE (PAD) ×1 IMPLANT
GLIDESHEATH SLEND SS 6F .021 (SHEATH) ×1 IMPLANT
GUIDEWIRE INQWIRE 1.5J.035X260 (WIRE) IMPLANT
INQWIRE 1.5J .035X260CM (WIRE) ×2
KIT ENCORE 26 ADVANTAGE (KITS) ×1 IMPLANT
KIT HEART LEFT (KITS) ×3 IMPLANT
PACK CARDIAC CATHETERIZATION (CUSTOM PROCEDURE TRAY) ×3 IMPLANT
SHEATH PROBE COVER 6X72 (BAG) ×1 IMPLANT
STENT SYNERGY XD 2.50X24 (Permanent Stent) IMPLANT
SYNERGY XD 2.50X24 (Permanent Stent) ×2 IMPLANT
TRANSDUCER W/STOPCOCK (MISCELLANEOUS) ×3 IMPLANT
TUBING CIL FLEX 10 FLL-RA (TUBING) ×3 IMPLANT
VALVE GUARDIAN II ~~LOC~~ HEMO (MISCELLANEOUS) ×1 IMPLANT
WIRE ASAHI PROWATER 180CM (WIRE) ×1 IMPLANT

## 2021-08-11 NOTE — TOC Benefit Eligibility Note (Signed)
Patient Product/process development scientist completed.    The patient is currently admitted and upon discharge could be taking Brilinta 90 mg.  The current 30 day co-pay is, $0.00.   The patient is currently admitted and upon discharge could be taking Farxiga 10 mg.  The current 30 day co-pay is, $0.00.   The patient is currently admitted and upon discharge could be taking Jardiance 10 mg.  The current 30 day co-pay is, $0.00.   The patient is insured through [employer] Part D     Roland Earl, CPhT Pharmacy Patient Advocate Specialist [facility] Patient Advocate Team Direct Number: [phone]  Fax: [phone]

## 2021-08-11 NOTE — Telephone Encounter (Signed)
Pharmacy Patient Advocate Encounter  Insurance verification completed.    The patient is insured through [employer]   The patient is currently admitted and ran test claims for the following: Brilinta, Farxiga, Jardiance.  Copays and coinsurance results were relayed to Inpatient clinical team.

## 2021-08-11 NOTE — Interval H&P Note (Signed)
Cath Lab Visit (complete for each Cath Lab visit)  Clinical Evaluation Leading to the Procedure:   ACS: Yes.    Non-ACS:    Anginal Classification: CCS IV  Anti-ischemic medical therapy: Minimal Therapy (1 class of medications)  Non-Invasive Test Results: No non-invasive testing performed  Prior CABG: No previous CABG      History and Physical Interval Note:  08/11/2021 9:46 AM  [name]  has presented today for surgery, with the diagnosis of CAD/ Post STEMI.  The various methods of treatment have been discussed with the patient and family. After consideration of risks, benefits and other options for treatment, the patient has consented to  Procedure(s): CORONARY STENT INTERVENTION (N/A) as a surgical intervention.  The patient's history has been reviewed, patient examined, no change in status, stable for surgery.  I have reviewed the patient's chart and labs.  Questions were answered to the patient's satisfaction.     Lance Muss

## 2021-08-11 NOTE — Progress Notes (Signed)
CARDIAC REHAB PHASE I   Stent/MI education reinforced with pt and family. Pt educated on importance of ASA and Brilinta. Reviewed site care, restrictions, diet, and exercise guidelines. Will refer to CRP II [place]. Pt and family deny further questions or concerns at this time.  [id number] Reynold Bowen, RN BSN 08/11/2021 1:47 PM

## 2021-08-11 NOTE — Progress Notes (Signed)
Up and walked and tolerated well; no c/o chest pain or shortness of breath 

## 2021-08-11 NOTE — Inpatient Diabetes Management (Signed)
Inpatient Diabetes Program Recommendations  AACE/ADA: New Consensus Statement on Inpatient Glycemic Control (2015)  Target Ranges:  Prepandial:   less than 140 mg/dL      Peak postprandial:   less than 180 mg/dL (1-2 hours)      Critically ill patients:  140 - 180 mg/dL   Lab Results  Component Value Date   GLUCAP 198 (H) 08/09/2021   HGBA1C 6.8 (H) 08/09/2021    Review of Glycemic Control  Diabetes history: New diagnosis this admission Outpatient Diabetes medications: None Current orders for Inpatient glycemic control:  None   Inpatient Diabetes Program Recommendations:    Consider adding CBGs TID?   Thanks, Lujean Rave, MSN, RNC-OB Diabetes Coordinator [phone] (8a-5p)

## 2021-08-11 NOTE — Discharge Summary (Addendum)
Discharge Summary    Patient ID: ASON HESLIN MRN: [id number]; DOB: 1970/05/29  Admit date: 08/08/2021 Discharge date: 08/11/2021  PCP:  Samuel Bouche, NP   [facility] Providers Cardiologist:  Larae Grooms, MD       Discharge Diagnoses    Principal Problem:   Acute ST elevation myocardial infarction (STEMI) due to occlusion of left anterior descending (LAD) coronary artery [facility]) Active Problems:   Essential hypertension   Rheumatoid arthritis ([place])   Hyperlipidemia LDL goal <70   Tobacco use   Coronary artery disease involving native coronary artery of native heart with unstable angina pectoris ([place])   Presence of drug coated stent in LAD coronary artery    Diagnostic Studies/Procedures    Cath: 08/08/21    Mid RCA lesion is 90% stenosed.   1st Mrg lesion is 50% stenosed.   Mid LAD lesion is 100% stenosed.  Culprit lesion for anterior STEMI.   A drug-eluting stent was successfully placed using a SYNERGY XD 2.75X16, postdilated to 3.25 mm and optimized with intravascular ultrasound..   Post intervention, there is a 0% residual stenosis.   Mild, diffuse coronary disease.   Successful PCI of an occluded LAD causing anterior ST elevation MI.  He will need to be watched in the ICU.  Dual antiplatelet therapy with aspirin and Brilinta.  High-dose atorvastatin.  We will check lipids and A1c.  Plan for PCI of the RCA on Monday.   Results discussed with the patient's wife and son.  Diagnostic Dominance: Right  Intervention     Cath: 08/11/21    LAD stent was patent.   1st Mrg lesion is 50% stenosed.   Mid RCA lesion is 90% stenosed.  A drug-eluting stent was successfully placed using a SYNERGY XD 2.50X24, postdilated to greater than 3 mm in diameter.   Post intervention, there is a 0% residual stenosis.   LV end diastolic pressure is normal.   There is no aortic valve stenosis.   Successful PCI of the mid RCA.  He will need aggressive secondary prevention with  aspirin, Brilinta, high-dose statin, beta-blocker.  He was already on an ARB.  He will need regular exercise along with healthy diet.   He will benefit from cardiac rehab and smoking cessation.  We will plan for discharge later today if no complications.  Diagnostic Dominance: Right  Intervention    Echo: 08/11/21  IMPRESSIONS     1. Left ventricular ejection fraction, by estimation, is 65 to 70%. The  left ventricle has normal function. The left ventricle has no regional  wall motion abnormalities. Left ventricular diastolic parameters were  normal.   2. Right ventricular systolic function is normal. The right ventricular  size is normal.   3. The mitral valve is normal in structure. No evidence of mitral valve  regurgitation. No evidence of mitral stenosis.   4. The aortic valve is grossly normal. Aortic valve regurgitation is not  visualized. No aortic stenosis is present.   Comparison(s): Prior images reviewed side by side. Changes from prior  study are noted. EF slightly higher than prior (2019).   Conclusion(s)/Recommendation(s): Normal biventricular function without  evidence of hemodynamically significant valvular heart disease.   FINDINGS   Left Ventricle: Left ventricular ejection fraction, by estimation, is 65  to 70%. The left ventricle has normal function. The left ventricle has no  regional wall motion abnormalities. The left ventricular internal cavity  size was normal in size. There is   no left ventricular hypertrophy.  Left ventricular diastolic parameters  were normal.   Right Ventricle: The right ventricular size is normal. No increase in  right ventricular wall thickness. Right ventricular systolic function is  normal.   Left Atrium: Left atrial size was normal in size.   Right Atrium: Right atrial size was normal in size.   Pericardium: Trivial pericardial effusion is present.   Mitral Valve: The mitral valve is normal in structure. No evidence  of  mitral valve regurgitation. No evidence of mitral valve stenosis. MV peak  gradient, 2.9 mmHg. The mean mitral valve gradient is 1.0 mmHg.   Tricuspid Valve: The tricuspid valve is normal in structure. Tricuspid  valve regurgitation is not demonstrated. No evidence of tricuspid  stenosis.   Aortic Valve: The aortic valve is grossly normal. Aortic valve  regurgitation is not visualized. No aortic stenosis is present. Aortic  valve mean gradient measures 3.0 mmHg. Aortic valve peak gradient measures  6.8 mmHg. Aortic valve area, by VTI measures  3.64 cm.   Pulmonic Valve: The pulmonic valve was not well visualized. Pulmonic valve  regurgitation is not visualized. No evidence of pulmonic stenosis.   Aorta: The aortic root, ascending aorta and aortic arch are all  structurally normal, with no evidence of dilitation or obstruction.   Venous: The inferior vena cava was not well visualized.   IAS/Shunts: The atrial septum is grossly normal.  _____________   History of Present Illness     [name] is a 51 y.o. male with hypertension, hyperlipidemia, sleep apnea on CPAP and longstanding tobacco smoking (ongoing) who was seen 08/08/2021 for the evaluation of anterior lateral STEMI.  Mr. Ghan had a 3 to 4 days history of intermittent chest pain.  He described his pain was squeezing chest pressure at middle of his chest. The morning of admission his pain was persistent and more severe.  He went to see his PCP and found to have anterior lateral ST elevation.  Code STEMI was called and EMS activated.  In route, he received aspirin 324 mg, 4 nitroglycerin and 6 mg of IV morphine. Patient was seen in cardiac catheterization lab.  He continued to have 10 out of 10 chest pressure radiating to his jaw and left shoulder. Denies prior history of stroke, MI or bleeding.  Reported longstanding history of tobacco smoking and family history of CAD.    Hospital Course     Anterior lateral  STEMI CAD -- Underwent cardiac catheterization noted above on 7/14 with culprit lesion being 100% stenosed mid LAD lesion treated with PCI/DES x1, residual disease of 90% mid RCA to be treated in staged fashion.  He was placed on DAPT with aspirin/Brilinta with recommendations for at least 1 year.  Underwent successful staged intervention to the mid RCA with PCI/DES x1.  Seen by cardiac rehab. Follow up echo showed LVEF of 65-70%, rWMAs.  --Continue on aspirin, Brilinta, metoprolol 12.5 mg twice daily and atorvastatin 80 mg daily  HLD: LDL 120, HDL 31 --continued on atorvastatin 80 mg daily --Referral to lipid clinic placed at discharge  HTN: Controlled --Continue metoprolol 12.5 mg twice daily  Diabetes: Hgb A1c 6.8 --Outpatient follow-up with PCP  Patient was seen by Dr. Irish Lack and deemed stable for discharge home. Follow up in the office arranged. Medications sent to [place]. Educated by PharmD prior to discharge.   Did the patient have an acute coronary syndrome (MI, NSTEMI, STEMI, etc) this admission?:  Yes  AHA/ACC Clinical Performance & Quality Measures: Aspirin prescribed? - Yes ADP Receptor Inhibitor (Plavix/Clopidogrel, Brilinta/Ticagrelor or Effient/Prasugrel) prescribed (includes medically managed patients)? - Yes Beta Blocker prescribed? - Yes High Intensity Statin (Lipitor 40-58m or Crestor 20-467m prescribed? - Yes EF assessed during THIS hospitalization? - Yes For EF <40%, was ACEI/ARB prescribed? - Not Applicable (EF >/= 4086%For EF <40%, Aldosterone Antagonist (Spironolactone or Eplerenone) prescribed? - Not Applicable (EF >/= 4076%Cardiac Rehab Phase II ordered (including medically managed patients)? - Yes       The patient will be scheduled for a TOC follow up appointment in 10-14 days.  A message has been [facility] to the [facility] at the office where the patient should be seen for follow up.   _____________  Discharge Vitals Blood pressure (!) 129/97, pulse 65, temperature 97.8 F (36.6 C), temperature source Oral, resp. rate 16, weight 109 kg, SpO2 91 %.  Filed Weights   08/08/21 1100  Weight: 109 kg    Labs & Radiologic Studies    CBC Recent Labs    08/09/21 0634  WBC 11.0*  HGB 15.1  HCT 43.2  MCV 86.6  PLT 33720 Basic Metabolic Panel Recent Labs    08/09/21 0634  NA 134*  K 4.3  CL 101  CO2 23  GLUCOSE 244*  BUN 9  CREATININE 0.92  CALCIUM 8.9   Liver Function Tests No results for input(s): "AST", "ALT", "ALKPHOS", "BILITOT", "PROT", "ALBUMIN" in the last 72 hours.  No results for input(s): "LIPASE", "AMYLASE" in the last 72 hours. High Sensitivity Troponin:   Recent Labs  Lab 08/08/21 1139 08/09/21 0634  TROPONINIHS 156* 7,136*    BNP Invalid input(s): "POCBNP" D-Dimer No results for input(s): "DDIMER" in the last 72 hours. Hemoglobin A1C Recent Labs    08/09/21 0634  HGBA1C 6.8*   Fasting Lipid Panel Recent Labs    08/09/21 0634  CHOL 186  HDL 31*  LDLCALC 120*  TRIG 175*  CHOLHDL 6.0   Thyroid Function Tests No results for input(s): "TSH", "T4TOTAL", "T3FREE", "THYROIDAB" in the last 72 hours.  Invalid input(s): "FREET3" _____________  ECHOCARDIOGRAM COMPLETE  Result Date: 08/11/2021    ECHOCARDIOGRAM REPORT   Patient Name:   BIBRYSON GAVIAate of Exam: 08/11/2021 Medical Rec #:  [id number]    Height:       68.0 in Accession #:    [id number]   Weight:       240.3 lb Date of Birth:  4/09-10-1970     BSA:          2.210 m Patient Age:    [age]       BP:           124/64 mmHg Patient Gender: M              HR:           69 bpm. Exam Location:  Inpatient Procedure: 2D Echo, Cardiac Doppler and Color Doppler Indications:    Chest Pain  History:        Patient has prior history of Echocardiogram examinations, most                 recent 04/28/2017. CAD and Acute MI, Stroke; Risk                 Factors:Hypertension,  Dyslipidemia and Current Smoker.  Sonographer:    DoWenda Loweferring Phys: [phone]  LINDSAY B ROBERTS IMPRESSIONS  1. Left ventricular ejection fraction, by estimation, is 65 to 70%. The left ventricle has normal function. The left ventricle has no regional wall motion abnormalities. Left ventricular diastolic parameters were normal.  2. Right ventricular systolic function is normal. The right ventricular size is normal.  3. The mitral valve is normal in structure. No evidence of mitral valve regurgitation. No evidence of mitral stenosis.  4. The aortic valve is grossly normal. Aortic valve regurgitation is not visualized. No aortic stenosis is present. Comparison(s): Prior images reviewed side by side. Changes from prior study are noted. EF slightly higher than prior (2019). Conclusion(s)/Recommendation(s): Normal biventricular function without evidence of hemodynamically significant valvular heart disease. FINDINGS  Left Ventricle: Left ventricular ejection fraction, by estimation, is 65 to 70%. The left ventricle has normal function. The left ventricle has no regional wall motion abnormalities. The left ventricular internal cavity size was normal in size. There is  no left ventricular hypertrophy. Left ventricular diastolic parameters were normal. Right Ventricle: The right ventricular size is normal. No increase in right ventricular wall thickness. Right ventricular systolic function is normal. Left Atrium: Left atrial size was normal in size. Right Atrium: Right atrial size was normal in size. Pericardium: Trivial pericardial effusion is present. Mitral Valve: The mitral valve is normal in structure. No evidence of mitral valve regurgitation. No evidence of mitral valve stenosis. MV peak gradient, 2.9 mmHg. The mean mitral valve gradient is 1.0 mmHg. Tricuspid Valve: The tricuspid valve is normal in structure. Tricuspid valve regurgitation is not demonstrated. No evidence of tricuspid stenosis. Aortic  Valve: The aortic valve is grossly normal. Aortic valve regurgitation is not visualized. No aortic stenosis is present. Aortic valve mean gradient measures 3.0 mmHg. Aortic valve peak gradient measures 6.8 mmHg. Aortic valve area, by VTI measures 3.64 cm. Pulmonic Valve: The pulmonic valve was not well visualized. Pulmonic valve regurgitation is not visualized. No evidence of pulmonic stenosis. Aorta: The aortic root, ascending aorta and aortic arch are all structurally normal, with no evidence of dilitation or obstruction. Venous: The inferior vena cava was not well visualized. IAS/Shunts: The atrial septum is grossly normal.  LEFT VENTRICLE PLAX 2D LVIDd:         5.30 cm     Diastology LVIDs:         3.20 cm     LV e' medial:    8.92 cm/s LV PW:         0.80 cm     LV E/e' medial:  8.3 LV IVS:        1.10 cm     LV e' lateral:   7.51 cm/s LVOT diam:     2.10 cm     LV E/e' lateral: 9.8 LV SV:         93 LV SV Index:   42 LVOT Area:     3.46 cm  LV Volumes (MOD) LV vol d, MOD A2C: 61.0 ml LV vol d, MOD A4C: 74.3 ml LV vol s, MOD A2C: 20.4 ml LV vol s, MOD A4C: 37.5 ml LV SV MOD A2C:     40.6 ml LV SV MOD A4C:     74.3 ml LV SV MOD BP:      40.6 ml RIGHT VENTRICLE RV Basal diam:  3.60 cm RV Mid diam:    3.20 cm RV S prime:     24.90 cm/s TAPSE (M-mode): 3.4 cm LEFT ATRIUM  Index        RIGHT ATRIUM           Index LA diam:      4.20 cm 1.90 cm/m   RA Area:     16.40 cm LA Vol (A2C): 48.8 ml 22.09 ml/m  RA Volume:   42.10 ml  19.05 ml/m LA Vol (A4C): 51.9 ml 23.49 ml/m  AORTIC VALVE                    PULMONIC VALVE AV Area (Vmax):    3.25 cm     PV Vmax:       0.92 m/s AV Area (Vmean):   2.90 cm     PV Peak grad:  3.4 mmHg AV Area (VTI):     3.64 cm AV Vmax:           130.00 cm/s AV Vmean:          79.800 cm/s AV VTI:            0.255 m AV Peak Grad:      6.8 mmHg AV Mean Grad:      3.0 mmHg LVOT Vmax:         122.00 cm/s LVOT Vmean:        66.900 cm/s LVOT VTI:          0.268 m LVOT/AV VTI ratio:  1.05  AORTA Ao Root diam: 3.90 cm MITRAL VALVE MV Area (PHT): 2.55 cm    SHUNTS MV Area VTI:   3.54 cm    Systemic VTI:  0.27 m MV Peak grad:  2.9 mmHg    Systemic Diam: 2.10 cm MV Mean grad:  1.0 mmHg MV Vmax:       0.85 m/s MV Vmean:      49.8 cm/s MV Decel Time: 297 msec MV E velocity: 73.70 cm/s MV A velocity: 81.80 cm/s MV E/A ratio:  0.90 Buford Dresser MD Electronically signed by Buford Dresser MD Signature Date/Time: 08/11/2021/2:39:49 PM    Final    CARDIAC CATHETERIZATION  Result Date: 08/11/2021   LAD stent was patent.   1st Mrg lesion is 50% stenosed.   Mid RCA lesion is 90% stenosed.  A drug-eluting stent was successfully placed using a SYNERGY XD 2.50X24, postdilated to greater than 3 mm in diameter.   Post intervention, there is a 0% residual stenosis.   LV end diastolic pressure is normal.   There is no aortic valve stenosis. Successful PCI of the mid RCA.  He will need aggressive secondary prevention with aspirin, Brilinta, high-dose statin, beta-blocker.  He was already on an ARB.  He will need regular exercise along with healthy diet. He will benefit from cardiac rehab and smoking cessation.  We will plan for discharge later today if no complications.   CARDIAC CATHETERIZATION  Result Date: 08/08/2021   Mid RCA lesion is 90% stenosed.   1st Mrg lesion is 50% stenosed.   Mid LAD lesion is 100% stenosed.  Culprit lesion for anterior STEMI.   A drug-eluting stent was successfully placed using a SYNERGY XD 2.75X16, postdilated to 3.25 mm and optimized with intravascular ultrasound..   Post intervention, there is a 0% residual stenosis.   Mild, diffuse coronary disease. Successful PCI of an occluded LAD causing anterior ST elevation MI.  He will need to be watched in the ICU.  Dual antiplatelet therapy with aspirin and Brilinta.  High-dose atorvastatin.  We will check lipids and A1c.  Plan for  PCI of the RCA on Monday. Results discussed with the patient's wife and son.     Disposition   Pt is being discharged home today in good condition.  Follow-up Plans & Appointments     Follow-up Information     Emmaline Life, NP Follow up on 08/25/2021.   Specialty: Nurse Practitioner Why: at 10am for your follow up appt with Dr. Morton Amy' NP Natasha Mead information: [place] [id number] [phone]         Samuel Bouche, NP Follow up.   Specialty: Nurse Practitioner Why: Please arrange follow up regarding management of your diabetes Contact information: [address] [place] [address] [phone]                Discharge Instructions     AMB Referral to Advanced Lipid Disorders Clinic   Complete by: As directed    Internal Lipid Clinic Referral Scheduling  Internal lipid clinic referrals are providers within [facility], who wish to refer established patients for routine management (help in starting PCSK9 inhibitor therapy) or advanced therapies.  Internal MD referral criteria:              1. All patients with LDL>190 mg/dL  2. All patients with Triglycerides >500 mg/dL  3. Patients with suspected or confirmed heterozygous familial hyperlipidemia (HeFH) or homozygous familial hyperlipidemia (HoFH)  4. Patients with family history of suspicious for genetic dyslipidemia desiring genetic testing  5. Patients refractory to standard guideline based therapy  6. Patients with statin intolerance (failed 2 statins, one of which must be a high potency statin)  7. Patients who the provider desires to be seen by MD   Internal PharmD referral criteria:   1. Follow-up patients for medication management  2. Follow-up for compliance monitoring  3. Patients for drug education  4. Patients with statin intolerance  5. PCSK9 inhibitor education and prior authorization approvals  6. Patients with triglycerides <500 mg/dL  External Lipid Clinic Referral  External lipid clinic referrals are for  providers outside of [facility], considered new clinic patients - automatically routed to MD schedule   AMB Referral to Cardiac Rehabilitation - Phase II   Complete by: As directed    Diagnosis:  STEMI Coronary Stents     After initial evaluation and assessments completed: Virtual Based Care may be provided alone or in conjunction with Phase 2 Cardiac Rehab based on patient barriers.: Yes   Amb Referral to Cardiac Rehabilitation   Complete by: As directed    Diagnosis:  Coronary Stents STEMI PTCA     After initial evaluation and assessments completed: Virtual Based Care may be provided alone or in conjunction with Phase 2 Cardiac Rehab based on patient barriers.: Yes   Call MD for:  difficulty breathing, headache or visual disturbances   Complete by: As directed    Call MD for:  persistant dizziness or light-headedness   Complete by: As directed    Call MD for:  redness, tenderness, or signs of infection (pain, swelling, redness, odor or green/yellow discharge around incision site)   Complete by: As directed    Diet - low sodium heart healthy   Complete by: As directed    Discharge instructions   Complete by: As directed    Radial Site Care Refer to this sheet in the next few weeks. These instructions provide you with information on caring for yourself after your procedure. Your caregiver may also give you  more specific instructions. Your treatment has been planned according to current medical practices, but problems sometimes occur. Call your caregiver if you have any problems or questions after your procedure. HOME CARE INSTRUCTIONS You may shower the day after the procedure. Remove the bandage (dressing) and gently wash the site with plain soap and water. Gently pat the site dry.  Do not apply powder or lotion to the site.  Do not submerge the affected site in water for 3 to 5 days.  Inspect the site at least twice daily.  Do not flex or bend the affected arm for 24 hours.  No  lifting over 5 pounds (2.3 kg) for 5 days after your procedure.  Do not drive home if you are discharged the same day of the procedure. Have someone else drive you.  You may drive 24 hours after the procedure unless otherwise instructed by your caregiver.  What to expect: Any bruising will usually fade within 1 to 2 weeks.  Blood that collects in the tissue (hematoma) may be painful to the touch. It should usually decrease in size and tenderness within 1 to 2 weeks.  SEEK IMMEDIATE MEDICAL CARE IF: You have unusual pain at the radial site.  You have redness, warmth, swelling, or pain at the radial site.  You have drainage (other than a small amount of blood on the dressing).  You have chills.  You have a fever or persistent symptoms for more than 72 hours.  You have a fever and your symptoms suddenly get worse.  Your arm becomes pale, cool, tingly, or numb.  You have heavy bleeding from the site. Hold pressure on the site.   PLEASE DO NOT MISS ANY DOSES OF YOUR BRILINTA!!!!! Also keep a log of you blood pressures and bring back to your follow up appt. Please call the office with any questions.   Patients taking blood thinners should generally stay away from medicines like ibuprofen, Advil, Motrin, naproxen, and Aleve due to risk of stomach bleeding. You may take Tylenol as directed or talk to your primary doctor about alternatives.  PLEASE ENSURE THAT YOU DO NOT RUN OUT OF YOUR BRILINTA. This medication is very important to remain on for at least one year. IF you have issues obtaining this medication due to cost please CALL the office 3-5 business days prior to running out in order to prevent missing doses of this medication.   Increase activity slowly   Complete by: As directed        Discharge Medications   Allergies as of 08/11/2021       Reactions   Lisinopril Cough   Benadryl [diphenhydramine Hcl] Anxiety        Medication List     STOP taking these medications     amLODipine 10 MG tablet Commonly known as: NORVASC   chlorthalidone 25 MG tablet Commonly known as: HYGROTON   SUMAtriptan 100 MG tablet Commonly known as: IMITREX       TAKE these medications    albuterol 108 (90 Base) MCG/ACT inhaler Commonly known as: VENTOLIN HFA Inhale 2 puffs into the lungs every 6 (six) hours as needed for wheezing.   Aspirin Low Dose 81 MG chewable tablet Generic drug: aspirin Chew 1 tablet (81 mg total) by mouth daily. Start taking on: August 12, 2021   atorvastatin 80 MG tablet Commonly known as: LIPITOR Take 1 tablet (80 mg total) by mouth daily.   Brilinta 90 MG Tabs tablet Generic drug: ticagrelor Take 1  tablet (90 mg total) by mouth 2 (two) times daily.   cyclobenzaprine 10 MG tablet Commonly known as: FLEXERIL Take 1 tablet (10 mg total) by mouth 3 (three) times daily as needed for muscle spasms.   diclofenac Sodium 1 % Gel Commonly known as: VOLTAREN Apply 1 Application topically daily as needed (For pain).   escitalopram 10 MG tablet Commonly known as: LEXAPRO Take 1 tablet (10 mg total) by mouth daily.   fluticasone furoate-vilanterol 200-25 MCG/ACT Aepb Commonly known as: BREO ELLIPTA Inhale 1 puff into the lungs daily.   loratadine 10 MG tablet Commonly known as: CLARITIN Take 1 tablet (10 mg total) by mouth daily.   methotrexate 2.5 MG tablet Commonly known as: RHEUMATREX Take 20 mg by mouth every Tuesday. Caution:Chemotherapy. Protect from light.   Every other tuesday   metoprolol tartrate 25 MG tablet Commonly known as: LOPRESSOR Take 0.5 tablet (12.5 mg total) by mouth 2 (two) times daily.   Nicotine 21-14-7 MG/24HR Kit Place 1 patch onto the skin daily.   nitroGLYCERIN 0.4 MG SL tablet Commonly known as: Nitrostat Place 1 tablet (0.4 mg total) under the tongue every 5 (five) minutes as needed.   omeprazole 40 MG capsule Commonly known as: PRILOSEC Take 1 capsule (40 mg total) by mouth daily.    oxyCODONE-acetaminophen 7.5-325 MG tablet Commonly known as: PERCOCET Take 1 tablet by mouth 3 (three) times daily as needed.   pregabalin 75 MG capsule Commonly known as: LYRICA Take 1 capsule (75 mg total) by mouth 2 (two) times daily.   promethazine 25 MG tablet Commonly known as: PHENERGAN Take 1 tablet (25 mg total) by mouth every 6 (six) hours as needed for nausea or vomiting.   valsartan-hydrochlorothiazide 160-25 MG tablet Commonly known as: DIOVAN-HCT Take 1 tablet by mouth daily.          Outstanding Labs/Studies   Referral to the lipid clinic  Duration of Discharge Encounter   Greater than 30 minutes including physician time.  Signed, Reino Bellis, NP 08/11/2021, 3:12 PM   I have examined the patient and reviewed assessment and plan and discussed with patient.  Agree with above as stated.    Successful PCI of LAD in the setting of anterior STEMI on 08/08/2021.  He had some exertional angina walking with cardiac rehab.  His severe lesion in the RCA was stented successfully today.  His chest pain resolved.  We discussed dual antiplatelet therapy to prevent stent thrombosis.  Aggressive secondary prevention including tobacco cessation and lipid-lowering therapy.  He is agreeable to all of these lifestyle modifications.  I spoke with his family as well.  Okay for discharge later today.  Right radial site stable.  Larae Grooms

## 2021-08-12 ENCOUNTER — Telehealth: Payer: Self-pay

## 2021-08-12 ENCOUNTER — Telehealth: Payer: Self-pay | Admitting: General Practice

## 2021-08-12 ENCOUNTER — Encounter (HOSPITAL_COMMUNITY): Payer: Self-pay | Admitting: Interventional Cardiology

## 2021-08-12 NOTE — Telephone Encounter (Signed)
Transition Care Management Unsuccessful Follow-up Telephone Call  Date of discharge and from where:  08/11/21 from [facility]  Attempts:  1st Attempt  Reason for unsuccessful TCM follow-up call:  Voice mail full

## 2021-08-12 NOTE — Telephone Encounter (Signed)
**Note De-identified  Obfuscation** -----  **Note De-Identified  Obfuscation** Message from Arty Baumgartner, NP sent at 08/11/2021 12:56 PM EDT ----- Regarding: TOC call Needs TOC call please

## 2021-08-12 NOTE — Telephone Encounter (Signed)
**Note De-Identified  Obfuscation** Patient contacted regarding discharge from [facility] on 08/11/2021.  Patient understands to follow up with provider Eligha Bridegroom, NP on 08/25/2021 at 10:05 at [address] in [place], [place] [address]. Patient understands discharge instructions? Yes Patient understands medications and regiment? Yes Patient understands to bring all medications to this visit? Yes  Ask patient:  Are you enrolled in My Chart: No and he states that he is not interested in signing up.   The pt reports that other than soreness at his right radial cath site and SOB that he states is not a new symptom for him as he is a smoker he is doing "Ok".  He denies having any redness, streaking, fever, or drainage at his cath site as well as any c/o CP/discomfort, worsening SOB, nausea, vomiting, diaphoresis, dizziness, or lightheadedness.  He verified that he does have [facility] phone number and understands to call us if he has any questions or concerns.  He thanked me for calling him.

## 2021-08-13 NOTE — Telephone Encounter (Signed)
Transition Care Management Unsuccessful Follow-up Telephone Call  Date of discharge and from where:  08/11/21 from [facility]  Attempts:  2nd Attempt  Reason for unsuccessful TCM follow-up call:  Voice mail full

## 2021-08-18 ENCOUNTER — Telehealth (INDEPENDENT_AMBULATORY_CARE_PROVIDER_SITE_OTHER): Payer: Medicare Other | Admitting: Psychiatry

## 2021-08-18 ENCOUNTER — Encounter (HOSPITAL_COMMUNITY): Payer: Self-pay | Admitting: Psychiatry

## 2021-08-18 DIAGNOSIS — F331 Major depressive disorder, recurrent, moderate: Secondary | ICD-10-CM

## 2021-08-18 DIAGNOSIS — F5102 Adjustment insomnia: Secondary | ICD-10-CM | POA: Diagnosis not present

## 2021-08-18 DIAGNOSIS — F411 Generalized anxiety disorder: Secondary | ICD-10-CM | POA: Diagnosis not present

## 2021-08-18 MED ORDER — ESCITALOPRAM OXALATE 10 MG PO TABS: 10.0000 mg | ORAL_TABLET | Freq: Every day | ORAL | 2 refills | Status: DC

## 2021-08-18 NOTE — Progress Notes (Signed)
Patient ID: [name], male   DOB: 1970-05-05, 51 y.o.   MRN: [id number] [facility] Follow-up Outpatient Visit  [name] [id number] 51 y.o.  08/18/2021  Virtual Visit via Video Note  I connected with [name] on 08/18/21 at  2:00 PM EDT by a video enabled telemedicine application and verified that I am speaking with the correct person using two identifiers.  Location: Patient: home Provider: home office   I discussed the limitations of evaluation and management by telemedicine and the availability of in person appointments. The patient expressed understanding and agreed to proceed.     I discussed the assessment and treatment plan with the patient. The patient was provided an opportunity to ask questions and all were answered. The patient agreed with the plan and demonstrated an understanding of the instructions.   The patient was advised to call back or seek an in-person evaluation if the symptoms worsen or if the condition fails to improve as anticipated.  I provided 115 - 20 minutes of non-face-to-face time during this encounter.    Chief Complaint: follow up for depression and anxiety.   History of Present Illness:   Has been doing fair but 2 weeks ago had chest pain and diangosed with MI, got stent  .  Denies chest pain now and have stopped smoking, mood wise recovering but stress has been there due to recent MI Discussed stressors, meds reviewed and planning to start back on walking with after cardiology re assessment  Modifying factor: home, family Depression manageable   Duration more then 10 years     Past Medical History:  Diagnosis Date   Acute ST elevation myocardial infarction (STEMI) due to occlusion of left anterior descending (LAD) coronary artery ([place]) 08/08/2021   mLAD 100% -> DES PCI Synergy DES 2.75 mm x 16 (3.25 mm); => mRCA90% -> staged PCI.    Anxiety    Arthritis    Asthma    history [id number]   Coronary artery  disease involving native coronary artery of native heart with unstable angina pectoris ([place]) 08/08/2021   Ant STEMI - Culprit 100% mLAD (DES PCI); mRCA 90% (Staged PCI 7/17), OM1 50%.   Depression    Essential hypertension 12/11/2011   Gout    Headache    History of hiatal hernia    History of lithotripsy    Hypertension    Kidney stones    Lumbar degenerative disc disease 12/11/2011   Moderate obstructive sleep apnea 04/21/2021   Nephrolithiasis 12/11/2011   Pericarditis    Renal tubular acidosis    Rheumatoid arthritis(714.0)    Family History  Problem Relation Age of Onset   Hypertension Father    Alcohol abuse Father    Heart attack Father 24   Diabetes Mellitus II Father    Coronary artery disease Father    Diabetes Mellitus II Mother        Martin Majestic into a diabetic coma, per pt.   Cancer - Colon Mother    Coronary artery disease Mother    Heart attack Mother 24   Ovarian cancer Mother    Uterine cancer Mother    Heart attack Brother    Coronary artery disease Brother    Heart murmur Sister    Dementia Neg Hx    Bipolar disorder Neg Hx    Depression Neg Hx    Drug abuse Neg Hx    OCD Neg Hx     Outpatient Encounter Medications as of  08/18/2021  Medication Sig   albuterol (VENTOLIN HFA) 108 (90 Base) MCG/ACT inhaler Inhale 2 puffs into the lungs every 6 (six) hours as needed for wheezing.   aspirin 81 MG chewable tablet Chew 1 tablet (81 mg total) by mouth daily.   atorvastatin (LIPITOR) 80 MG tablet Take 1 tablet (80 mg total) by mouth daily.   cyclobenzaprine (FLEXERIL) 10 MG tablet Take 1 tablet (10 mg total) by mouth 3 (three) times daily as needed for muscle spasms.   diclofenac Sodium (VOLTAREN) 1 % GEL Apply 1 Application topically daily as needed (For pain).   escitalopram (LEXAPRO) 10 MG tablet Take 1 tablet (10 mg total) by mouth daily.   fluticasone furoate-vilanterol (BREO ELLIPTA) 200-25 MCG/ACT AEPB Inhale 1 puff into the lungs daily.   loratadine  (CLARITIN) 10 MG tablet Take 1 tablet (10 mg total) by mouth daily.   methotrexate (RHEUMATREX) 2.5 MG tablet Take 20 mg by mouth every Tuesday. Caution:Chemotherapy. Protect from light.   Every other tuesday   metoprolol tartrate (LOPRESSOR) 25 MG tablet Take 0.5 tablet (12.5 mg total) by mouth 2 (two) times daily.   Nicotine 21-14-7 MG/24HR KIT Place 1 patch onto the skin daily.   nitroGLYCERIN (NITROSTAT) 0.4 MG SL tablet Place 1 tablet (0.4 mg total) under the tongue every 5 (five) minutes as needed.   omeprazole (PRILOSEC) 40 MG capsule Take 1 capsule (40 mg total) by mouth daily.   oxyCODONE-acetaminophen (PERCOCET) 7.5-325 MG tablet Take 1 tablet by mouth 3 (three) times daily as needed.   pregabalin (LYRICA) 75 MG capsule Take 1 capsule (75 mg total) by mouth 2 (two) times daily.   promethazine (PHENERGAN) 25 MG tablet Take 1 tablet (25 mg total) by mouth every 6 (six) hours as needed for nausea or vomiting.   ticagrelor (BRILINTA) 90 MG TABS tablet Take 1 tablet (90 mg total) by mouth 2 (two) times daily.   valsartan-hydrochlorothiazide (DIOVAN-HCT) 160-25 MG tablet Take 1 tablet by mouth daily.   [DISCONTINUED] escitalopram (LEXAPRO) 10 MG tablet Take 1 tablet (10 mg total) by mouth daily.   No facility-administered encounter medications on file as of 08/18/2021.    Recent Results (from the past 2160 hour(s))  I-STAT, chem 8     Status: Abnormal   Collection Time: 08/08/21 11:16 AM  Result Value Ref Range   Sodium 137 135 - 145 mmol/L   Potassium 3.7 3.5 - 5.1 mmol/L   Chloride 103 98 - 111 mmol/L   BUN 9 6 - 20 mg/dL   Creatinine, Ser 0.90 0.61 - 1.24 mg/dL   Glucose, Bld 220 (H) 70 - 99 mg/dL    Comment: Glucose reference range applies only to samples taken after fasting for at least 8 hours.   Calcium, Ion 1.13 (L) 1.15 - 1.40 mmol/L   TCO2 20 (L) 22 - 32 mmol/L   Hemoglobin 15.6 13.0 - 17.0 g/dL   HCT 46.0 39.0 - 52.0 %  POCT Activated clotting time     Status: None    Collection Time: 08/08/21 11:33 AM  Result Value Ref Range   Activated Clotting Time 305 seconds    Comment: Reference range 74-137 seconds for patients not on anticoagulant therapy.  Comprehensive metabolic panel     Status: Abnormal   Collection Time: 08/08/21 11:39 AM  Result Value Ref Range   Sodium 135 135 - 145 mmol/L   Potassium 3.7 3.5 - 5.1 mmol/L   Chloride 102 98 - 111 mmol/L   CO2 19 (  L) 22 - 32 mmol/L   Glucose, Bld 216 (H) 70 - 99 mg/dL    Comment: Glucose reference range applies only to samples taken after fasting for at least 8 hours.   BUN 9 6 - 20 mg/dL   Creatinine, Ser 1.00 0.61 - 1.24 mg/dL   Calcium 9.0 8.9 - 10.3 mg/dL   Total Protein 6.8 6.5 - 8.1 g/dL   Albumin 3.6 3.5 - 5.0 g/dL   AST 41 15 - 41 U/L   ALT 40 0 - 44 U/L   Alkaline Phosphatase 46 38 - 126 U/L   Total Bilirubin 0.8 0.3 - 1.2 mg/dL   GFR, Estimated >60 >60 mL/min    Comment: (NOTE) Calculated using the CKD-EPI Creatinine Equation (2021)    Anion gap 14 5 - 15    Comment: Performed at [facility] [address], [place], [place] [address]  CBC     Status: Abnormal   Collection Time: 08/08/21 11:39 AM  Result Value Ref Range   WBC 11.0 (H) 4.0 - 10.5 K/uL   RBC 5.22 4.22 - 5.81 MIL/uL   Hemoglobin 15.5 13.0 - 17.0 g/dL   HCT 44.8 39.0 - 52.0 %   MCV 85.8 80.0 - 100.0 fL   MCH 29.7 26.0 - 34.0 pg   MCHC 34.6 30.0 - 36.0 g/dL   RDW 13.2 11.5 - 15.5 %   Platelets 397 150 - 400 K/uL   nRBC 0.0 0.0 - 0.2 %    Comment: Performed at [facility], [place] [address] [place], [place] [address]  Troponin I (High Sensitivity)     Status: Abnormal   Collection Time: 08/08/21 11:39 AM  Result Value Ref Range   Troponin I (High Sensitivity) 156 (HH) <18 ng/L    Comment: CRITICAL RESULT CALLED TO, READ BACK BY AND VERIFIED WITH CRITICAL RESULT CALLED TO, READ BACK BY AND VERIFIED WITH K.DILLON,RN  1240 08/08/21 CLARK,S (NOTE) Elevated high sensitivity troponin I (hsTnI) values and  significant  changes across serial measurements may suggest ACS but many other  chronic and acute conditions are known to elevate hsTnI results.  Refer to the "Links" section for chest pain algorithms and additional  guidance. Performed at [facility], [place] [address], [place], [place] [address]   Hemoglobin A1c     Status: Abnormal   Collection Time: 08/08/21 11:41 AM  Result Value Ref Range   Hgb A1c MFr Bld 6.8 (H) 4.8 - 5.6 %    Comment: (NOTE) Pre diabetes:          5.7%-6.4%  Diabetes:              >6.4%  Glycemic control for   <7.0% adults with diabetes    Mean Plasma Glucose 148.46 mg/dL    Comment: Performed at [facility] [address], [place], [place] [address]  MRSA Next Gen by PCR, Nasal     Status: None   Collection Time: 08/08/21  3:11 PM   Specimen: Nasal Mucosa; Nasal Swab  Result Value Ref Range   MRSA by PCR Next Gen NOT DETECTED NOT DETECTED    Comment: (NOTE) The GeneXpert MRSA Assay (FDA approved for NASAL specimens only), is one component of a comprehensive MRSA colonization surveillance program. It is not intended to diagnose MRSA infection nor to guide or monitor treatment for MRSA infections. Test performance is not FDA approved in patients less than 62 years old. Performed at [facility], [place] [address], [place], [place] [address]   Basic metabolic panel     Status: Abnormal   Collection Time: 08/09/21  6:34 AM  Result Value Ref Range   Sodium 134 (L) 135 - 145 mmol/L   Potassium 4.3 3.5 - 5.1 mmol/L   Chloride 101 98 - 111 mmol/L   CO2 23 22 - 32 mmol/L   Glucose, Bld 244 (H) 70 - 99 mg/dL    Comment: Glucose reference range applies only to samples taken after fasting for at least 8 hours.   BUN 9 6 - 20 mg/dL   Creatinine, Ser 0.92 0.61 - 1.24 mg/dL   Calcium 8.9 8.9 - 10.3 mg/dL   GFR, Estimated >60 >60 mL/min    Comment: (NOTE) Calculated using the CKD-EPI Creatinine Equation (2021)    Anion gap 10 5 - 15     Comment: Performed at [facility] [address], [place], [place] [address]  CBC     Status: Abnormal   Collection Time: 08/09/21  6:34 AM  Result Value Ref Range   WBC 11.0 (H) 4.0 - 10.5 K/uL   RBC 4.99 4.22 - 5.81 MIL/uL   Hemoglobin 15.1 13.0 - 17.0 g/dL   HCT 43.2 39.0 - 52.0 %   MCV 86.6 80.0 - 100.0 fL   MCH 30.3 26.0 - 34.0 pg   MCHC 35.0 30.0 - 36.0 g/dL   RDW 13.4 11.5 - 15.5 %   Platelets 332 150 - 400 K/uL   nRBC 0.0 0.0 - 0.2 %    Comment: Performed at [facility], [place] [address] [place], [place] [address]  Lipoprotein A (LPA)     Status: None   Collection Time: 08/09/21  6:34 AM  Result Value Ref Range   Lipoprotein (a) 12.8 <75.0 nmol/L    Comment: (NOTE) Note:  Values greater than or equal to 75.0 nmol/L may       indicate an independent risk factor for CHD,       but must be evaluated with caution when applied       to non-Caucasian populations due to the       influence of genetic factors on Lp(a) across       ethnicities. Performed At: [facility] [place], [place] [id number] Rush Farmer MD [id number]   Lipid panel     Status: Abnormal   Collection Time: 08/09/21  6:34 AM  Result Value Ref Range   Cholesterol 186 0 - 200 mg/dL   Triglycerides 175 (H) <150 mg/dL   HDL 31 (L) >40 mg/dL   Total CHOL/HDL Ratio 6.0 RATIO   VLDL 35 0 - 40 mg/dL   LDL Cholesterol 120 (H) 0 - 99 mg/dL    Comment:        Total Cholesterol/HDL:CHD Risk Coronary Heart Disease Risk Table                     Men   Women  1/2 Average Risk   3.4   3.3  Average Risk       5.0   4.4  2 X Average Risk   9.6   7.1  3 X Average Risk  23.4   11.0        Use the calculated Patient Ratio above and the CHD Risk Table to determine the patient's CHD Risk.        ATP III CLASSIFICATION (LDL):  <100  mg/dL   Optimal  100-129  mg/dL   Near or Above                    Optimal  130-159  mg/dL   Borderline  160-189  mg/dL   High  >190      mg/dL   Very High Performed at [facility] [address] [place], [place] [address]   Hemoglobin A1c     Status: Abnormal   Collection Time: 08/09/21  6:34 AM  Result Value Ref Range   Hgb A1c MFr Bld 6.8 (H) 4.8 - 5.6 %    Comment: (NOTE) Pre diabetes:          5.7%-6.4%  Diabetes:              >6.4%  Glycemic control for   <7.0% adults with diabetes    Mean Plasma Glucose 148.46 mg/dL    Comment: Performed at [facility] [address], [place], [place] [address]  Troponin I (High Sensitivity)     Status: Abnormal   Collection Time: 08/09/21  6:34 AM  Result Value Ref Range   Troponin I (High Sensitivity) 7,136 (HH) <18 ng/L    Comment: CRITICAL VALUE NOTED. VALUE IS CONSISTENT WITH PREVIOUSLY REPORTED/CALLED VALUE (NOTE) Elevated high sensitivity troponin I (hsTnI) values and significant  changes across serial measurements may suggest ACS but many other  chronic and acute conditions are known to elevate hsTnI results.  Refer to the "Links" section for chest pain algorithms and additional  guidance. Performed at [facility], [place] [address] [place], [place] [address]   Glucose, capillary     Status: Abnormal   Collection Time: 08/09/21  1:19 PM  Result Value Ref Range   Glucose-Capillary 198 (H) 70 - 99 mg/dL    Comment: Glucose reference range applies only to samples taken after fasting for at least 8 hours.  POCT Activated clotting time     Status: None   Collection Time: 08/11/21 10:21 AM  Result Value Ref Range   Activated Clotting Time 486 seconds    Comment: Reference range 74-137 seconds for patients not on anticoagulant therapy.  POCT Activated clotting time     Status: None   Collection Time: 08/11/21 10:33 AM  Result Value Ref Range   Activated Clotting Time 474 seconds    Comment: Reference range 74-137 seconds for patients not on anticoagulant therapy.  Glucose, capillary     Status: Abnormal   Collection Time: 08/11/21 11:18 AM   Result Value Ref Range   Glucose-Capillary 160 (H) 70 - 99 mg/dL    Comment: Glucose reference range applies only to samples taken after fasting for at least 8 hours.  ECHOCARDIOGRAM COMPLETE     Status: None   Collection Time: 08/11/21 12:40 PM  Result Value Ref Range   Weight 3,844.82 oz   BP 124/64 mmHg   Single Plane A2C EF 66.6 %   Single Plane A4C EF 49.5 %   Calc EF 59.2 %   S' Lateral 3.20 cm   AR max vel 3.25 cm2   AV Area VTI 3.64 cm2   AV Mean grad 3.0 mmHg   AV Peak grad 6.8 mmHg   Ao pk vel 1.30 m/s   Area-P 1/2 2.55 cm2   AV Area mean vel 2.90 cm2   MV VTI 3.54 cm2    There were no vitals taken for this visit.   Review of  Systems  Cardiovascular:  Negative for chest pain and palpitations.  Skin:  Negative for itching and rash.  Psychiatric/Behavioral:  Negative for depression and suicidal ideas.     Mental Status Examination  Appearance:casual Alert: Yes Attention: fair  Cooperative: Yes Eye Contact: Speech: coherent Psychomotor Activity: Normal Memory/Concentration: adequate Oriented: person, place, time/date and situation Mood: fair Affect:congruent Thought Processes and Associations: Coherent Fund of Knowledge: Fair Thought Content: Suicidal ideation and Homicidal ideation were denied. Denies hallucinations Insight: Fair Judgement: Fair  Diagnosis: Major depressive disorder recurrent moderate to severe. Generalized anxiety disorder. Insomnia. 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

## 2021-08-18 NOTE — Progress Notes (Unsigned)
   Established Patient Office Visit  Subjective   Patient ID: [name], male   DOB: 1970/06/27 Age: 51 y.o. MRN: [id number]   No chief complaint on file.   HPI Pleasant 51 year old male presenting today for a hospital discharge follow-up after having a STEMI due to LAD artery occlusion.  He underwent successful PCI procedures to both the LAD as well as the RCA for significant stenosis.  On discharge, his ejection fraction was 65-70%.  He was referred to the advanced lipid clinic and advised to follow-up with his PCP.  During his hospitalization his hemoglobin A1c was checked and found to be 6.8% indicating that he is now type II diabetic.  He was advised to follow-up with his PCP regarding this.  Medications at discharge include aspirin, metoprolol 12.5 mg twice daily, and atorvastatin 80 mg daily.  He has a follow-up with cardiology next week.      Objective:    There were no vitals filed for this visit.  Physical Exam   No results found for this or any previous visit (from the past 24 hour(s)).   {Labs (Optional):23779}  The ASCVD Risk score (Arnett DK, et al., 2019) failed to calculate for the following reasons:   The patient has a prior MI or stroke diagnosis   Assessment & Plan:   No problem-specific Assessment & Plan notes found for this encounter.   No follow-ups on file.  ___________________________________________ Thayer Ohm, DNP, APRN, FNP-BC Primary Care and Sports Medicine [facility]

## 2021-08-19 ENCOUNTER — Ambulatory Visit (INDEPENDENT_AMBULATORY_CARE_PROVIDER_SITE_OTHER): Payer: Medicare Other | Admitting: Medical-Surgical

## 2021-08-19 ENCOUNTER — Encounter: Payer: Self-pay | Admitting: Medical-Surgical

## 2021-08-19 VITALS — BP 148/92 | HR 80

## 2021-08-19 DIAGNOSIS — Z09 Encounter for follow-up examination after completed treatment for conditions other than malignant neoplasm: Secondary | ICD-10-CM | POA: Diagnosis not present

## 2021-08-19 DIAGNOSIS — Z716 Tobacco abuse counseling: Secondary | ICD-10-CM

## 2021-08-19 DIAGNOSIS — E1169 Type 2 diabetes mellitus with other specified complication: Secondary | ICD-10-CM | POA: Diagnosis not present

## 2021-08-19 DIAGNOSIS — I2102 ST elevation (STEMI) myocardial infarction involving left anterior descending coronary artery: Secondary | ICD-10-CM | POA: Diagnosis not present

## 2021-08-19 MED ORDER — NICOTINE 21-14-7 MG/24HR TD KIT
1.0000 | PACK | Freq: Every day | TRANSDERMAL | 0 refills | Status: DC
Start: 2021-08-19 — End: 2021-11-19

## 2021-08-19 MED ORDER — BLOOD GLUCOSE MONITOR KIT: PACK | 0 refills | Status: DC

## 2021-08-19 MED ORDER — LORATADINE 10 MG PO TABS: 10.0000 mg | ORAL_TABLET | Freq: Every day | ORAL | 3 refills | Status: DC

## 2021-08-19 NOTE — Progress Notes (Signed)
Cardiology Office Note:    Date:  08/25/2021   ID:  [name], DOB 08-19-1970, MRN [id number]  PCP:  Samuel Bouche, NP   [facility] Providers Cardiologist:  Larae Grooms, MD     Referring MD: Samuel Bouche, NP   Chief Complaint: hospital f/u CAD s/p STEMI, DES to LAD x 1, DES to RCA x 1  History of Present Illness:    [name] is a very pleasant 51 y.o. male with a hx of CAD s/p STEMI due to occlusion of LAD s/p DES, hypertension, hyperlipidemia, tobacco use, and type 2 diabetes.   Admission 7/14-7/17/23 with chest pain described as squeezing chest pressure in middle of his chest. On morning of admission, pain was more persistent and severe. Went to see PCP and found to have anterior lateral ST elevation. Code STEMI was called and EMS was activated. Once at [facility], he was taken directly to cardiac cath lab with reported 10/10 pain radiating to jaw and left shoulder. Culprit lesion being 100% stenosed mid LAD lesion treated with PCI/DES x 1, residual disease of 90% mid RCA to be treated in staged fashion. Underwent successful staged intervention to mid RCA with PCI/DES x 1 . Had normal LVEF 65-70%, no rwmas. Referred to lipid clinic at discharge.  Today, he is here with his wife for follow-up. Reports he is feeling well and is resuming regular activities. Is gardening and walking 30 minutes daily except when it is raining. Reports prior to STEMI, his symptom was mid-sternal chest pain for approximately one week prior, very intense on the day of admission. Notes a marked improvement in stamina and breathing since stenting. Does not work, is on disability. Prior to admission, diet was unrestricted. He is working to make improvements. He denies chest pain, shortness of breath, lower extremity edema, fatigue, palpitations, melena, hematuria, hemoptysis, diaphoresis, weakness, presyncope, syncope, orthopnea, and PND.  Past Medical History:  Diagnosis Date   Acute ST elevation myocardial  infarction (STEMI) due to occlusion of left anterior descending (LAD) coronary artery (HCC) 08/08/2021   mLAD 100% -> DES PCI Synergy DES 2.75 mm x 16 (3.25 mm); => mRCA90% -> staged PCI.    Anxiety    Arthritis    Asthma    history [id number]   Coronary artery disease involving native coronary artery of native heart with unstable angina pectoris ([place]) 08/08/2021   Ant STEMI - Culprit 100% mLAD (DES PCI); mRCA 90% (Staged PCI 7/17), OM1 50%.   Depression    Essential hypertension 12/11/2011   Gout    Headache    History of hiatal hernia    History of lithotripsy    Hypertension    Kidney stones    Lumbar degenerative disc disease 12/11/2011   Moderate obstructive sleep apnea 04/21/2021   Nephrolithiasis 12/11/2011   Pericarditis    Renal tubular acidosis    Rheumatoid arthritis(714.0)     Past Surgical History:  Procedure Laterality Date   ANTERIOR CERVICAL DECOMP/DISCECTOMY FUSION     ANTERIOR CERVICAL DECOMP/DISCECTOMY FUSION N/A 11/25/2017   Procedure: Anterior Cervical Discectomy Fusion - Cervical Six-Cervical Seven, removal Cervical Five-Six plate;  Surgeon: Eustace Moore, MD;  Location: [place];  Service: Neurosurgery;  Laterality: N/A;  Anterior Cervical Discectomy Fusion - Cervical Six-Cervical Seven, removal Cervical Five-Six plate   CARPAL TUNNEL RELEASE Left 11/06/2015   Procedure: LEFT WRIST CARPAL TUNNEL RELEASE;  Surgeon: Iran Planas, MD;  Location: [place];  Service: Orthopedics;  Laterality: Left;   CORONARY  STENT INTERVENTION N/A 08/11/2021   Procedure: CORONARY STENT INTERVENTION;  Surgeon: Jettie Booze, MD;  Location: [place] [facility];  Service: Cardiovascular;  Laterality: N/A;   CORONARY/GRAFT ACUTE MI REVASCULARIZATION N/A 08/08/2021   Procedure: Coronary/Graft Acute MI Revascularization;  Surgeon: Jettie Booze, MD;  Location: [place] [facility];  Service: Cardiovascular;  Laterality: N/A;   HEMORROIDECTOMY     INTRAVASCULAR ULTRASOUND/IVUS  N/A 08/08/2021   Procedure: Intravascular Ultrasound/IVUS;  Surgeon: Jettie Booze, MD;  Location: [place] [facility];  Service: Cardiovascular;  Laterality: N/A;   KIDNEY STONE SURGERY     LEFT HEART CATH AND CORONARY ANGIOGRAPHY N/A 08/08/2021   Procedure: LEFT HEART CATH AND CORONARY ANGIOGRAPHY;  Surgeon: Jettie Booze, MD;  Location: [place] [facility];  Service: Cardiovascular;  Laterality: N/A;   LEFT HEART CATH AND CORONARY ANGIOGRAPHY N/A 08/11/2021   Procedure: LEFT HEART CATH AND CORONARY ANGIOGRAPHY;  Surgeon: Jettie Booze, MD;  Location: [place] [facility];  Service: Cardiovascular;  Laterality: N/A;   WRIST FUSION Left 11/06/2015   Procedure: ARTHRODESIS LEFT WRIST;  Surgeon: Iran Planas, MD;  Location: [place];  Service: Orthopedics;  Laterality: Left;    Current Medications: Current Meds  Medication Sig   albuterol (VENTOLIN HFA) 108 (90 Base) MCG/ACT inhaler Inhale 2 puffs into the lungs every 6 (six) hours as needed for wheezing.   aspirin 81 MG chewable tablet Chew 1 tablet (81 mg total) by mouth daily.   blood glucose meter kit and supplies KIT Dispense based on patient and insurance preference. Use up to four times daily as directed. Please include lancets, test strips, control solution.   cyclobenzaprine (FLEXERIL) 10 MG tablet Take 1 tablet (10 mg total) by mouth 3 (three) times daily as needed for muscle spasms.   diclofenac Sodium (VOLTAREN) 1 % GEL Apply 1 Application topically daily as needed (For pain).   escitalopram (LEXAPRO) 10 MG tablet Take 1 tablet (10 mg total) by mouth daily.   fluticasone furoate-vilanterol (BREO ELLIPTA) 200-25 MCG/ACT AEPB Inhale 1 puff into the lungs daily.   loratadine (CLARITIN) 10 MG tablet Take 1 tablet (10 mg total) by mouth daily.   methotrexate (RHEUMATREX) 2.5 MG tablet Take 20 mg by mouth every Tuesday. Caution:Chemotherapy. Protect from light.   Every other tuesday   Nicotine 21-14-7 MG/24HR KIT Place 1  patch onto the skin daily.   nitroGLYCERIN (NITROSTAT) 0.4 MG SL tablet Place 1 tablet (0.4 mg total) under the tongue every 5 (five) minutes as needed.   omeprazole (PRILOSEC) 40 MG capsule Take 1 capsule (40 mg total) by mouth daily.   oxyCODONE-acetaminophen (PERCOCET) 7.5-325 MG tablet Take 1 tablet by mouth 3 (three) times daily as needed.   pregabalin (LYRICA) 75 MG capsule Take 1 capsule (75 mg total) by mouth 2 (two) times daily.   promethazine (PHENERGAN) 25 MG tablet Take 1 tablet (25 mg total) by mouth every 6 (six) hours as needed for nausea or vomiting.   [DISCONTINUED] atorvastatin (LIPITOR) 80 MG tablet Take 1 tablet (80 mg total) by mouth daily.   [DISCONTINUED] metoprolol tartrate (LOPRESSOR) 25 MG tablet Take 0.5 tablet (12.5 mg total) by mouth 2 (two) times daily.   [DISCONTINUED] ticagrelor (BRILINTA) 90 MG TABS tablet Take 1 tablet (90 mg total) by mouth 2 (two) times daily.   [DISCONTINUED] valsartan-hydrochlorothiazide (DIOVAN-HCT) 160-25 MG tablet Take 1 tablet by mouth daily.     Allergies:   Lisinopril and Benadryl [diphenhydramine hcl]   Social  History   Socioeconomic History   Marital status: Married    Spouse name: Manuela Schwartz   Number of children: 4   Years of education: 9th grade   Highest education level: 9th grade  Occupational History    Comment: Legally disabled  Tobacco Use   Smoking status: Every Day    Packs/day: 1.00    Years: 30.00    Total pack years: 30.00    Types: Cigarettes   Smokeless tobacco: Former    Types: Chew    Quit date: 1988   Tobacco comments:    Rx written for Chantix from Dr. Sheppard Coil.  Vaping Use   Vaping Use: Never used  Substance and Sexual Activity   Alcohol use: No   Drug use: No   Sexual activity: Yes    Partners: Female  Other Topics Concern   Not on file  Social History Narrative   Lives with his family. He walks daily, four times a day with his dog. He enjoys fishing and camping.   Social Determinants of  Health   Financial Resource Strain: Low Risk  (06/17/2020)   Overall Financial Resource Strain (CARDIA)    Difficulty of Paying Living Expenses: Not hard at all  Food Insecurity: No Food Insecurity (06/17/2020)   Hunger Vital Sign    Worried About Running Out of Food in the Last Year: Never true    Ran Out of Food in the Last Year: Never true  Transportation Needs: No Transportation Needs (06/17/2020)   PRAPARE - Hydrologist (Medical): No    Lack of Transportation (Non-Medical): No  Physical Activity: Inactive (06/17/2020)   Exercise Vital Sign    Days of Exercise per Week: 0 days    Minutes of Exercise per Session: 0 min  Stress: No Stress Concern Present (06/17/2020)   [place]    Feeling of Stress : Not at all  Social Connections: Moderately Isolated (06/17/2020)   Social Connection and Isolation Panel [NHANES]    Frequency of Communication with Friends and Family: More than three times a week    Frequency of Social Gatherings with Friends and Family: More than three times a week    Attends Religious Services: Never    Marine scientist or Organizations: No    Attends Music therapist: Never    Marital Status: Married     Family History: The patient's family history includes Alcohol abuse in his father; Cancer - Colon in his mother; Coronary artery disease in his brother, father, and mother; Diabetes Mellitus II in his father and mother; Heart attack in his brother; Heart attack (age of onset: 24) in his mother; Heart attack (age of onset: 81) in his father; Heart murmur in his sister; Hypertension in his father; Ovarian cancer in his mother; Uterine cancer in his mother. There is no history of Dementia, Bipolar disorder, Depression, Drug abuse, or OCD.  ROS:   Please see the history of present illness.  All other systems reviewed and are negative.  Labs/Other Studies  Reviewed:    The following studies were reviewed today:  Echo 08/11/21  1. Left ventricular ejection fraction, by estimation, is 65 to 70%. The  left ventricle has normal function. The left ventricle has no regional  wall motion abnormalities. Left ventricular diastolic parameters were  normal.   2. Right ventricular systolic function is normal. The right ventricular  size is normal.   3. The  mitral valve is normal in structure. No evidence of mitral valve  regurgitation. No evidence of mitral stenosis.   4. The aortic valve is grossly normal. Aortic valve regurgitation is not  visualized. No aortic stenosis is present.   Comparison(s): Prior images reviewed side by side. Changes from prior  study are noted. EF slightly higher than prior (2019).   Conclusion(s)/Recommendation(s): Normal biventricular function without  evidence of hemodynamically significant valvular heart disease.   LHC 08/08/21    Mid RCA lesion is 90% stenosed.   1st Mrg lesion is 50% stenosed.   Mid LAD lesion is 100% stenosed.  Culprit lesion for anterior STEMI.   A drug-eluting stent was successfully placed using a SYNERGY XD 2.75X16, postdilated to 3.25 mm and optimized with intravascular ultrasound..   Post intervention, there is a 0% residual stenosis.   Mild, diffuse coronary disease.   Successful PCI of an occluded LAD causing anterior ST elevation MI.  He will need to be watched in the ICU.  Dual antiplatelet therapy with aspirin and Brilinta.  High-dose atorvastatin.  We will check lipids and A1c.  Plan for PCI of the RCA on Monday.  Coronary stent intervention 08/11/21    LAD stent was patent.   1st Mrg lesion is 50% stenosed.   Mid RCA lesion is 90% stenosed.  A drug-eluting stent was successfully placed using a SYNERGY XD 2.50X24, postdilated to greater than 3 mm in diameter.   Post intervention, there is a 0% residual stenosis.   LV end diastolic pressure is normal.   There is no aortic valve  stenosis.   Successful PCI of the mid RCA.  He will need aggressive secondary prevention with aspirin, Brilinta, high-dose statin, beta-blocker.  He was already on an ARB.  He will need regular exercise along with healthy diet.    Diagnostic Dominance: Right  Intervention    Recent Labs: 08/08/2021: ALT 40 08/09/2021: BUN 9; Creatinine, Ser 0.92; Hemoglobin 15.1; Platelets 332; Potassium 4.3; Sodium 134  Recent Lipid Panel    Component Value Date/Time   CHOL 186 08/09/2021 0634   TRIG 175 (H) 08/09/2021 0634   HDL 31 (L) 08/09/2021 0634   CHOLHDL 6.0 08/09/2021 0634   VLDL 35 08/09/2021 0634   LDLCALC 120 (H) 08/09/2021 0634   LDLCALC 155 (H) 04/03/2021 0000     Risk Assessment/Calculations:       Physical Exam:    VS:  BP 140/80   Pulse 65   Ht '5\' 8"'  (1.727 m)   Wt 247 lb 6.4 oz (112.2 kg)   SpO2 96%   BMI 37.62 kg/m     Wt Readings from Last 3 Encounters:  08/25/21 247 lb 6.4 oz (112.2 kg)  08/08/21 240 lb 4.8 oz (109 kg)  04/03/21 247 lb 12.8 oz (112.4 kg)     GEN:  Well nourished, well developed in no acute distress HEENT: Normal NECK: No JVD; No carotid bruits CARDIAC: RRR, no murmurs, rubs, gallops RESPIRATORY:  Clear to auscultation without rales, wheezing or rhonchi  ABDOMEN: Soft, non-tender, obese MUSCULOSKELETAL:  No edema; No deformity. 2+ pedal pulses, equal bilaterally SKIN: Warm and dry NEUROLOGIC:  Alert and oriented x 3 PSYCHIATRIC:  Normal affect   EKG:  EKG is ordered today.  The ekg ordered today demonstrates NSR at 65 bpm, nonspecific ST/T  wave abnormality with improvement in ST elevation in anterior leads from previous tracing  Diagnoses:    1. Acute ST elevation myocardial infarction (STEMI) due to occlusion  of left anterior descending (LAD) coronary artery ([place])   2. Coronary artery disease involving native coronary artery of native heart without angina pectoris   3. Hyperlipidemia LDL goal <70   4. Essential hypertension   5.  Status post coronary artery stent placement    Assessment and Plan:     CAD s/p STEMI: Mid RCA lesion 90% stenosed, first marginal lesion 50% stenosis, mid LAD lesion 100% stenosis felt to be culprit lesion for anterior STEMI.  Successful PCI of occluded LAD on 7/14. Successful PCI of RCA on 7/17. Plan to continue DAPT for 12 months without interruption. We discussed signs of bleeding to report, no concerns today. Encouraged healthy lifestyle including whole food mostly plant-based diet, 150 minutes of moderate intensity exercise each week, good BP control, LDL cholesterol ideally less than 55, management of diabetes, along with GDMT including Brilinta, aspirin, atorvastatin, Lopressor, Diovan HCTZ.  No specific questions or concerns from patient.   Hyperlipidemia LDL goal < 70: LDL 120 on 08/09/21.  We will get repeat fasting labs on 9/25 at [place] near his home. Continue atorvastatin.   Hypertension: BP is elevated today, also by my recheck.  Normal BP readings at home with highest reading 130/80.  Advised him to continue to monitor and notify us if BP is consistently > 130/80.  Encouraged low-sodium diet < 2000 mg daily.  Continue Diovan HCTZ, Lopressor.   Tobacco abuse: Nicotine patches were not covered by patient's insurance.  He is using a nonprescription patch.  Encouraged him to call [phone] now. Complete cessation advised.     Cardiac Rehabilitation Eligibility Assessment  The patient is ready to start cardiac rehabilitation from a cardiac standpoint.       Medication Adjustments/Labs and Tests Ordered: Current medicines are reviewed at length with the patient today.  Concerns regarding medicines are outlined above.  Orders Placed This Encounter  Procedures   Lipid Profile   Hepatic function panel   EKG 12-Lead   Meds ordered this encounter  Medications   atorvastatin (LIPITOR) 80 MG tablet    Sig: Take 1 tablet (80 mg total) by mouth daily.    Dispense:  90 tablet     Refill:  3   metoprolol tartrate (LOPRESSOR) 25 MG tablet    Sig: Take 0.5 tablets (12.5 mg total) by mouth 2 (two) times daily.    Dispense:  90 tablet    Refill:  3   ticagrelor (BRILINTA) 90 MG TABS tablet    Sig: Take 1 tablet (90 mg total) by mouth 2 (two) times daily.    Dispense:  180 tablet    Refill:  3   valsartan-hydrochlorothiazide (DIOVAN-HCT) 160-25 MG tablet    Sig: Take 1 tablet by mouth daily.    Dispense:  90 tablet    Refill:  3    Patient Instructions  Medication Instructions:   All cardiac medications were filled today for # 90 to requested pharmacy.   *If you need a refill on your cardiac medications before your next appointment, please call your pharmacy*   Lab Work:  The week of September 25.  Please go to [facility] anytime between 8:00-4:30 fasting from midnight the night before.    If you have labs (blood work) drawn today and your tests are completely normal, you will receive your results only by: [place] (if you have MyChart) OR A paper copy in the mail If you have any lab test that is abnormal or we need  to change your treatment, we will call you to review the results.   Testing/Procedures:  None ordered.   Follow-Up: At [facility], you and your health needs are our priority.  As part of our continuing mission to provide you with exceptional heart care, we have created designated Provider Care Teams.  These Care Teams include your primary Cardiologist (physician) and Advanced Practice Providers (APPs -  Physician Assistants and Nurse Practitioners) who all work together to provide you with the care you need, when you need it.  We recommend signing up for the patient portal called "MyChart".  Sign up information is provided on this After Visit Summary.  MyChart is used to connect with patients for Virtual Visits (Telemedicine).  Patients are able to view lab/test results, encounter notes, upcoming appointments, etc.  Non-urgent messages  can be sent to your provider as well.   To learn more about what you can do with MyChart, go to [email or web address].    Your next appointment:   3 month(s)  The format for your next appointment:   In Person  Provider:   Larae Grooms, MD     Other Instructions   Please contact [phone]. FOR FURTHER INFORMATION.   HOW TO TAKE YOUR BLOOD PRESSURE: Rest 5 minutes before taking your blood pressure.  Don't smoke or drink caffeinated beverages for at least 30 minutes before. Take your blood pressure before (not after) you eat. Sit comfortably with your back supported and both feet on the floor (don't cross your legs). Elevate your arm to heart level on a table or a desk. Use the proper sized cuff. It should fit smoothly and snugly around your bare upper arm. There should be enough room to slip a fingertip under the cuff. The bottom edge of the cuff should be 1 inch above the crease of the elbow.  Mediterranean Diet A Mediterranean diet refers to food and lifestyle choices that are based on the traditions of countries located on the [employer]. It focuses on eating more fruits, vegetables, whole grains, beans, nuts, seeds, and heart-healthy fats, and eating less dairy, meat, eggs, and processed foods with added sugar, salt, and fat. This way of eating has been shown to help prevent certain conditions and improve outcomes for people who have chronic diseases, like kidney disease and heart disease. What are tips for following this plan? Reading food labels Check the serving size of packaged foods. For foods such as rice and pasta, the serving size refers to the amount of cooked product, not dry. Check the total fat in packaged foods. Avoid foods that have saturated fat or trans fats. Check the ingredient list for added sugars, such as corn syrup. Shopping  Buy a variety of foods that offer a balanced diet, including: Fresh fruits and vegetables (produce). Grains, beans,  nuts, and seeds. Some of these may be available in unpackaged forms or large amounts (in bulk). Fresh seafood. Poultry and eggs. Low-fat dairy products. Buy whole ingredients instead of prepackaged foods. Buy fresh fruits and vegetables in-season from local farmers markets. Buy plain frozen fruits and vegetables. If you do not have access to quality fresh seafood, buy precooked frozen shrimp or canned fish, such as tuna, salmon, or sardines. Stock your pantry so you always have certain foods on hand, such as olive oil, canned tuna, canned tomatoes, rice, pasta, and beans. Cooking Cook foods with extra-virgin olive oil instead of using butter or other vegetable oils. Have meat as a side dish, and have vegetables  or grains as your main dish. This means having meat in small portions or adding small amounts of meat to foods like pasta or stew. Use beans or vegetables instead of meat in common dishes like chili or lasagna. Experiment with different cooking methods. Try roasting, broiling, steaming, and sauting vegetables. Add frozen vegetables to soups, stews, pasta, or rice. Add nuts or seeds for added healthy fats and plant protein at each meal. You can add these to yogurt, salads, or vegetable dishes. Marinate fish or vegetables using olive oil, lemon juice, garlic, and fresh herbs. Meal planning Plan to eat one vegetarian meal one day each week. Try to work up to two vegetarian meals, if possible. Eat seafood two or more times a week. Have healthy snacks readily available, such as: Vegetable sticks with hummus. Greek yogurt. Fruit and nut trail mix. Eat balanced meals throughout the week. This includes: Fruit: 2-3 servings a day. Vegetables: 4-5 servings a day. Low-fat dairy: 2 servings a day. Fish, poultry, or lean meat: 1 serving a day. Beans and legumes: 2 or more servings a week. Nuts and seeds: 1-2 servings a day. Whole grains: 6-8 servings a day. Extra-virgin olive oil: 3-4  servings a day. Limit red meat and sweets to only a few servings a month. Lifestyle  Cook and eat meals together with your family, when possible. Drink enough fluid to keep your urine pale yellow. Be physically active every day. This includes: Aerobic exercise like running or swimming. Leisure activities like gardening, walking, or housework. Get 7-8 hours of sleep each night. If recommended by your health care provider, drink red wine in moderation. This means 1 glass a day for nonpregnant women and 2 glasses a day for men. A glass of wine equals 5 oz (150 mL). What foods should I eat? Fruits Apples. Apricots. Avocado. Berries. Bananas. Cherries. Dates. Figs. Grapes. Lemons. Melon. Oranges. Peaches. Plums. Pomegranate. Vegetables Artichokes. Beets. Broccoli. Cabbage. Carrots. Eggplant. Green beans. Chard. Kale. Spinach. Onions. Leeks. Peas. Squash. Tomatoes. Peppers. Radishes. Grains Whole-grain pasta. Brown rice. Bulgur wheat. Polenta. Couscous. Whole-wheat bread. [place]. Meats and other proteins Beans. Almonds. Sunflower seeds. Pine nuts. Peanuts. [place]. Salmon. Scallops. Shrimp. [place]. Tilapia. Clams. Oysters. Eggs. Poultry without skin. Dairy Low-fat milk. Cheese. Greek yogurt. Fats and oils Extra-virgin olive oil. Avocado oil. Grapeseed oil. Beverages Water. Red wine. Herbal tea. Sweets and desserts Greek yogurt with honey. Baked apples. Poached pears. Trail mix. Seasonings and condiments Basil. Cilantro. Coriander. Cumin. Mint. Parsley. Sage. Rosemary. Tarragon. Garlic. Oregano. Thyme. Pepper. Balsamic vinegar. Tahini. Hummus. Tomato sauce. Olives. Mushrooms. The items listed above may not be a complete list of foods and beverages you can eat. Contact a dietitian for more information. What foods should I limit? This is a list of foods that should be eaten rarely or only on special occasions. Fruits Fruit canned in syrup. Vegetables Deep-fried potatoes (french  fries). Grains Prepackaged pasta or rice dishes. Prepackaged cereal with added sugar. Prepackaged snacks with added sugar. Meats and other proteins Beef. Pork. Lamb. Poultry with skin. Hot dogs. [place] Salines. Dairy Ice cream. Sour cream. Whole milk. Fats and oils Butter. Canola oil. Vegetable oil. Beef fat (tallow). Lard. Beverages Juice. Sugar-sweetened soft drinks. Beer. Liquor and spirits. Sweets and desserts Cookies. Cakes. Pies. Candy. Seasonings and condiments Mayonnaise. Pre-made sauces and marinades. The items listed above may not be a complete list of foods and beverages you should limit. Contact a dietitian for more information. Summary The Mediterranean diet includes both food and lifestyle choices.  Eat a variety of fresh fruits and vegetables, beans, nuts, seeds, and whole grains. Limit the amount of red meat and sweets that you eat. If recommended by your health care provider, drink red wine in moderation. This means 1 glass a day for nonpregnant women and 2 glasses a day for men. A glass of wine equals 5 oz (150 mL). This information is not intended to replace advice given to you by your health care provider. Make sure you discuss any questions you have with your health care provider. Document Revised: 02/17/2019 Document Reviewed: 12/15/2018 Elsevier Patient Education  [place]         Signed, Emmaline Life, NP  08/25/2021 5:01 PM    [facility]

## 2021-08-19 NOTE — Telephone Encounter (Signed)
Transition Care Management Follow-up Telephone Call Date of discharge and from where: 08/11/21 from [facility] How have you been since you were released from the hospital? Patient has an appt today with PCP at 1030. Any questions or concerns? No

## 2021-08-21 ENCOUNTER — Telehealth (HOSPITAL_COMMUNITY): Payer: Self-pay

## 2021-08-21 NOTE — Telephone Encounter (Signed)
Pt insurance is active and benefits verified through [facility] Medicare Co-pay 0, DED 0/0 met, out of pocket $8,300/$291.40 met, co-insurance 20%. no pre-authorization required. Passport, 08/21/2021_0 :51pm, REF# [id number]   How many CR sessions are covered? (36 sessions for TCR, 72 sessions for ICR)72 Is this a lifetime maximum or an annual maximum? lifetime Has the member used any of these services to date? no Is there a time limit (weeks/months) on start of program and/or program completion? no     Will contact patient to see if he is interested in the Cardiac Rehab Program. If interested, patient will need to complete follow up appt. Once completed, patient will be contacted for scheduling upon review by the RN Navigator.

## 2021-08-25 ENCOUNTER — Other Ambulatory Visit: Payer: Self-pay | Admitting: *Deleted

## 2021-08-25 ENCOUNTER — Ambulatory Visit (INDEPENDENT_AMBULATORY_CARE_PROVIDER_SITE_OTHER): Payer: Medicare Other | Admitting: Nurse Practitioner

## 2021-08-25 ENCOUNTER — Encounter: Payer: Self-pay | Admitting: Nurse Practitioner

## 2021-08-25 VITALS — BP 140/80 | HR 65 | Ht 68.0 in | Wt 247.4 lb

## 2021-08-25 DIAGNOSIS — I2510 Atherosclerotic heart disease of native coronary artery without angina pectoris: Secondary | ICD-10-CM | POA: Diagnosis not present

## 2021-08-25 DIAGNOSIS — E785 Hyperlipidemia, unspecified: Secondary | ICD-10-CM

## 2021-08-25 DIAGNOSIS — I10 Essential (primary) hypertension: Secondary | ICD-10-CM

## 2021-08-25 DIAGNOSIS — I2102 ST elevation (STEMI) myocardial infarction involving left anterior descending coronary artery: Secondary | ICD-10-CM

## 2021-08-25 DIAGNOSIS — Z955 Presence of coronary angioplasty implant and graft: Secondary | ICD-10-CM

## 2021-08-25 MED ORDER — METOPROLOL TARTRATE 25 MG PO TABS: 12.5000 mg | ORAL_TABLET | Freq: Two times a day (BID) | ORAL | 3 refills | Status: DC

## 2021-08-25 MED ORDER — ATORVASTATIN CALCIUM 80 MG PO TABS: 80.0000 mg | ORAL_TABLET | Freq: Every day | ORAL | 3 refills | Status: DC

## 2021-08-25 MED ORDER — VALSARTAN-HYDROCHLOROTHIAZIDE 160-25 MG PO TABS: 1.0000 | ORAL_TABLET | Freq: Every day | ORAL | 3 refills | Status: DC

## 2021-08-25 MED ORDER — TICAGRELOR 90 MG PO TABS: 90.0000 mg | ORAL_TABLET | Freq: Two times a day (BID) | ORAL | 3 refills | Status: DC

## 2021-08-25 NOTE — Patient Instructions (Signed)
Medication Instructions:   All cardiac medications were filled today for # 90 to requested pharmacy.   *If you need a refill on your cardiac medications before your next appointment, please call your pharmacy*   Lab Work:  The week of September 25.  Please go to [facility] anytime between 8:00-4:30 fasting from midnight the night before.    If you have labs (blood work) drawn today and your tests are completely normal, you will receive your results only by: MyChart Message (if you have MyChart) OR A paper copy in the mail If you have any lab test that is abnormal or we need to change your treatment, we will call you to review the results.   Testing/Procedures:  None ordered.   Follow-Up: At [facility], you and your health needs are our priority.  As part of our continuing mission to provide you with exceptional heart care, we have created designated Provider Care Teams.  These Care Teams include your primary Cardiologist (physician) and Advanced Practice Providers (APPs -  Physician Assistants and Nurse Practitioners) who all work together to provide you with the care you need, when you need it.  We recommend signing up for the patient portal called "MyChart".  Sign up information is provided on this After Visit Summary.  MyChart is used to connect with patients for Virtual Visits (Telemedicine).  Patients are able to view lab/test results, encounter notes, upcoming appointments, etc.  Non-urgent messages can be sent to your provider as well.   To learn more about what you can do with MyChart, go to [email or web address].    Your next appointment:   3 month(s)  The format for your next appointment:   In Person  Provider:   Lance Muss, MD     Other Instructions   Please contact [phone]. FOR FURTHER INFORMATION.   HOW TO TAKE YOUR BLOOD PRESSURE: Rest 5 minutes before taking your blood pressure.  Don't smoke or drink caffeinated beverages for at least 30  minutes before. Take your blood pressure before (not after) you eat. Sit comfortably with your back supported and both feet on the floor (don't cross your legs). Elevate your arm to heart level on a table or a desk. Use the proper sized cuff. It should fit smoothly and snugly around your bare upper arm. There should be enough room to slip a fingertip under the cuff. The bottom edge of the cuff should be 1 inch above the crease of the elbow.  Mediterranean Diet A Mediterranean diet refers to food and lifestyle choices that are based on the traditions of countries located on the [employer]. It focuses on eating more fruits, vegetables, whole grains, beans, nuts, seeds, and heart-healthy fats, and eating less dairy, meat, eggs, and processed foods with added sugar, salt, and fat. This way of eating has been shown to help prevent certain conditions and improve outcomes for people who have chronic diseases, like kidney disease and heart disease. What are tips for following this plan? Reading food labels Check the serving size of packaged foods. For foods such as rice and pasta, the serving size refers to the amount of cooked product, not dry. Check the total fat in packaged foods. Avoid foods that have saturated fat or trans fats. Check the ingredient list for added sugars, such as corn syrup. Shopping  Buy a variety of foods that offer a balanced diet, including: Fresh fruits and vegetables (produce). Grains, beans, nuts, and seeds. Some of these may be available in  unpackaged forms or large amounts (in bulk). Fresh seafood. Poultry and eggs. Low-fat dairy products. Buy whole ingredients instead of prepackaged foods. Buy fresh fruits and vegetables in-season from local farmers markets. Buy plain frozen fruits and vegetables. If you do not have access to quality fresh seafood, buy precooked frozen shrimp or canned fish, such as tuna, salmon, or sardines. Stock your pantry so you always have  certain foods on hand, such as olive oil, canned tuna, canned tomatoes, rice, pasta, and beans. Cooking Cook foods with extra-virgin olive oil instead of using butter or other vegetable oils. Have meat as a side dish, and have vegetables or grains as your main dish. This means having meat in small portions or adding small amounts of meat to foods like pasta or stew. Use beans or vegetables instead of meat in common dishes like chili or lasagna. Experiment with different cooking methods. Try roasting, broiling, steaming, and sauting vegetables. Add frozen vegetables to soups, stews, pasta, or rice. Add nuts or seeds for added healthy fats and plant protein at each meal. You can add these to yogurt, salads, or vegetable dishes. Marinate fish or vegetables using olive oil, lemon juice, garlic, and fresh herbs. Meal planning Plan to eat one vegetarian meal one day each week. Try to work up to two vegetarian meals, if possible. Eat seafood two or more times a week. Have healthy snacks readily available, such as: Vegetable sticks with hummus. Greek yogurt. Fruit and nut trail mix. Eat balanced meals throughout the week. This includes: Fruit: 2-3 servings a day. Vegetables: 4-5 servings a day. Low-fat dairy: 2 servings a day. Fish, poultry, or lean meat: 1 serving a day. Beans and legumes: 2 or more servings a week. Nuts and seeds: 1-2 servings a day. Whole grains: 6-8 servings a day. Extra-virgin olive oil: 3-4 servings a day. Limit red meat and sweets to only a few servings a month. Lifestyle  Cook and eat meals together with your family, when possible. Drink enough fluid to keep your urine pale yellow. Be physically active every day. This includes: Aerobic exercise like running or swimming. Leisure activities like gardening, walking, or housework. Get 7-8 hours of sleep each night. If recommended by your health care provider, drink red wine in moderation. This means 1 glass a day for  nonpregnant women and 2 glasses a day for men. A glass of wine equals 5 oz (150 mL). What foods should I eat? Fruits Apples. Apricots. Avocado. Berries. Bananas. Cherries. Dates. Figs. Grapes. Lemons. Melon. Oranges. Peaches. Plums. Pomegranate. Vegetables Artichokes. Beets. Broccoli. Cabbage. Carrots. Eggplant. Green beans. Chard. Kale. Spinach. Onions. Leeks. Peas. Squash. Tomatoes. Peppers. Radishes. Grains Whole-grain pasta. Brown rice. Bulgur wheat. Polenta. Couscous. Whole-wheat bread. Orpah Cobb. Meats and other proteins Beans. Almonds. Sunflower seeds. Pine nuts. Peanuts. Cod. Salmon. Scallops. Shrimp. Tuna. Tilapia. Clams. Oysters. Eggs. Poultry without skin. Dairy Low-fat milk. Cheese. Greek yogurt. Fats and oils Extra-virgin olive oil. Avocado oil. Grapeseed oil. Beverages Water. Red wine. Herbal tea. Sweets and desserts Greek yogurt with honey. Baked apples. Poached pears. Trail mix. Seasonings and condiments Basil. Cilantro. Coriander. Cumin. Mint. Parsley. Sage. Rosemary. Tarragon. Garlic. Oregano. Thyme. Pepper. Balsamic vinegar. Tahini. Hummus. Tomato sauce. Olives. Mushrooms. The items listed above may not be a complete list of foods and beverages you can eat. Contact a dietitian for more information. What foods should I limit? This is a list of foods that should be eaten rarely or only on special occasions. Fruits Fruit canned in syrup. Vegetables Deep-fried  potatoes (french fries). Grains Prepackaged pasta or rice dishes. Prepackaged cereal with added sugar. Prepackaged snacks with added sugar. Meats and other proteins Beef. Pork. Lamb. Poultry with skin. Hot dogs. Tomasa Blase. Dairy Ice cream. Sour cream. Whole milk. Fats and oils Butter. Canola oil. Vegetable oil. Beef fat (tallow). Lard. Beverages Juice. Sugar-sweetened soft drinks. Beer. Liquor and spirits. Sweets and desserts Cookies. Cakes. Pies. Candy. Seasonings and condiments Mayonnaise. Pre-made  sauces and marinades. The items listed above may not be a complete list of foods and beverages you should limit. Contact a dietitian for more information. Summary The Mediterranean diet includes both food and lifestyle choices. Eat a variety of fresh fruits and vegetables, beans, nuts, seeds, and whole grains. Limit the amount of red meat and sweets that you eat. If recommended by your health care provider, drink red wine in moderation. This means 1 glass a day for nonpregnant women and 2 glasses a day for men. A glass of wine equals 5 oz (150 mL). This information is not intended to replace advice given to you by your health care provider. Make sure you discuss any questions you have with your health care provider. Document Revised: 02/17/2019 Document Reviewed: 12/15/2018 Elsevier Patient Education  2023 Elsevier Inc.   Important Information About Sugar

## 2021-09-16 ENCOUNTER — Encounter (HOSPITAL_COMMUNITY): Payer: Self-pay

## 2021-10-13 ENCOUNTER — Telehealth (HOSPITAL_COMMUNITY): Payer: Self-pay

## 2021-10-13 NOTE — Telephone Encounter (Signed)
No response from pt regarding CR.  Closed referral.  

## 2021-11-03 NOTE — Progress Notes (Unsigned)
Cardiology Office Note   Date:  11/03/2021   ID:  [name], DOB October 14, 1970, MRN [id number]  PCP:  Samuel Bouche, NP    No chief complaint on file.   CAD Wt Readings from Last 3 Encounters:  08/25/21 247 lb 6.4 oz (112.2 kg)  08/08/21 240 lb 4.8 oz (109 kg)  04/03/21 247 lb 12.8 oz (112.4 kg)       History of Present Illness: [name] is a 51 y.o. male  with CAD s/p STEMI, DES to LAD x 1, DES to RCA x 1; h/o hypertension, hyperlipidemia, tobacco use, and type 2 diabetes.   Cath in 2023 showed: "Mid RCA lesion is 90% stenosed.   1st Mrg lesion is 50% stenosed.   Mid LAD lesion is 100% stenosed.  Culprit lesion for anterior STEMI.   A drug-eluting stent was successfully placed using a SYNERGY XD 2.75X16, postdilated to 3.25 mm and optimized with intravascular ultrasound..   Post intervention, there is a 0% residual stenosis.   Mild, diffuse coronary disease.   Successful PCI of an occluded LAD causing anterior ST elevation MI.  He will need to be watched in the ICU.  Dual antiplatelet therapy with aspirin and Brilinta.  High-dose atorvastatin.  We will check lipids and A1c.  Plan for PCI of the RCA on Monday.   Results discussed with the patient's wife and son."  Cath on 08/11/21 showed: "LAD stent was patent.   1st Mrg lesion is 50% stenosed.   Mid RCA lesion is 90% stenosed.  A drug-eluting stent was successfully placed using a SYNERGY XD 2.50X24, postdilated to greater than 3 mm in diameter.   Post intervention, there is a 0% residual stenosis.   LV end diastolic pressure is normal.   There is no aortic valve stenosis.   Successful PCI of the mid RCA.  He will need aggressive secondary prevention with aspirin, Brilinta, high-dose statin, beta-blocker.  He was already on an ARB.  He will need regular exercise along with healthy diet."    Past Medical History:  Diagnosis Date   Acute ST elevation myocardial infarction (STEMI) due to occlusion of left  anterior descending (LAD) coronary artery (HCC) 08/08/2021   mLAD 100% -> DES PCI Synergy DES 2.75 mm x 16 (3.25 mm); => mRCA90% -> staged PCI.    Anxiety    Arthritis    Asthma    history [id number]   Coronary artery disease involving native coronary artery of native heart with unstable angina pectoris ([place]) 08/08/2021   Ant STEMI - Culprit 100% mLAD (DES PCI); mRCA 90% (Staged PCI 7/17), OM1 50%.   Depression    Essential hypertension 12/11/2011   Gout    Headache    History of hiatal hernia    History of lithotripsy    Hypertension    Kidney stones    Lumbar degenerative disc disease 12/11/2011   Moderate obstructive sleep apnea 04/21/2021   Nephrolithiasis 12/11/2011   Pericarditis    Renal tubular acidosis    Rheumatoid arthritis(714.0)     Past Surgical History:  Procedure Laterality Date   ANTERIOR CERVICAL DECOMP/DISCECTOMY FUSION     ANTERIOR CERVICAL DECOMP/DISCECTOMY FUSION N/A 11/25/2017   Procedure: Anterior Cervical Discectomy Fusion - Cervical Six-Cervical Seven, removal Cervical Five-Six plate;  Surgeon: Eustace Moore, MD;  Location: [place];  Service: Neurosurgery;  Laterality: N/A;  Anterior Cervical Discectomy Fusion - Cervical Six-Cervical Seven, removal Cervical Five-Six plate   CARPAL TUNNEL RELEASE  Left 11/06/2015   Procedure: LEFT WRIST CARPAL TUNNEL RELEASE;  Surgeon: Iran Planas, MD;  Location: [place];  Service: Orthopedics;  Laterality: Left;   CORONARY STENT INTERVENTION N/A 08/11/2021   Procedure: CORONARY STENT INTERVENTION;  Surgeon: Jettie Booze, MD;  Location: [place] [facility];  Service: Cardiovascular;  Laterality: N/A;   CORONARY/GRAFT ACUTE MI REVASCULARIZATION N/A 08/08/2021   Procedure: Coronary/Graft Acute MI Revascularization;  Surgeon: Jettie Booze, MD;  Location: [place] [facility];  Service: Cardiovascular;  Laterality: N/A;   HEMORROIDECTOMY     INTRAVASCULAR ULTRASOUND/IVUS N/A 08/08/2021   Procedure: Intravascular  Ultrasound/IVUS;  Surgeon: Jettie Booze, MD;  Location: [place] [facility];  Service: Cardiovascular;  Laterality: N/A;   KIDNEY STONE SURGERY     LEFT HEART CATH AND CORONARY ANGIOGRAPHY N/A 08/08/2021   Procedure: LEFT HEART CATH AND CORONARY ANGIOGRAPHY;  Surgeon: Jettie Booze, MD;  Location: [place] [facility];  Service: Cardiovascular;  Laterality: N/A;   LEFT HEART CATH AND CORONARY ANGIOGRAPHY N/A 08/11/2021   Procedure: LEFT HEART CATH AND CORONARY ANGIOGRAPHY;  Surgeon: Jettie Booze, MD;  Location: [place] [facility];  Service: Cardiovascular;  Laterality: N/A;   WRIST FUSION Left 11/06/2015   Procedure: ARTHRODESIS LEFT WRIST;  Surgeon: Iran Planas, MD;  Location: [place];  Service: Orthopedics;  Laterality: Left;     Current Outpatient Medications  Medication Sig Dispense Refill   albuterol (VENTOLIN HFA) 108 (90 Base) MCG/ACT inhaler Inhale 2 puffs into the lungs every 6 (six) hours as needed for wheezing. 2 each 11   aspirin 81 MG chewable tablet Chew 1 tablet (81 mg total) by mouth daily. 90 tablet 2   atorvastatin (LIPITOR) 80 MG tablet Take 1 tablet (80 mg total) by mouth daily. 90 tablet 3   blood glucose meter kit and supplies KIT Dispense based on patient and insurance preference. Use up to four times daily as directed. Please include lancets, test strips, control solution. 1 each 0   cyclobenzaprine (FLEXERIL) 10 MG tablet Take 1 tablet (10 mg total) by mouth 3 (three) times daily as needed for muscle spasms. 90 tablet 5   diclofenac Sodium (VOLTAREN) 1 % GEL Apply 1 Application topically daily as needed (For pain).     escitalopram (LEXAPRO) 10 MG tablet Take 1 tablet (10 mg total) by mouth daily. 30 tablet 2   fluticasone furoate-vilanterol (BREO ELLIPTA) 200-25 MCG/ACT AEPB Inhale 1 puff into the lungs daily. 1 each 11   loratadine (CLARITIN) 10 MG tablet Take 1 tablet (10 mg total) by mouth daily. 90 tablet 3   methotrexate (RHEUMATREX) 2.5 MG tablet  Take 20 mg by mouth every Tuesday. Caution:Chemotherapy. Protect from light.   Every other tuesday     metoprolol tartrate (LOPRESSOR) 25 MG tablet Take 0.5 tablets (12.5 mg total) by mouth 2 (two) times daily. 90 tablet 3   Nicotine 21-14-7 MG/24HR KIT Place 1 patch onto the skin daily. 1 kit 0   nitroGLYCERIN (NITROSTAT) 0.4 MG SL tablet Place 1 tablet (0.4 mg total) under the tongue every 5 (five) minutes as needed. 25 tablet 2   omeprazole (PRILOSEC) 40 MG capsule Take 1 capsule (40 mg total) by mouth daily. 90 capsule 3   oxyCODONE-acetaminophen (PERCOCET) 7.5-325 MG tablet Take 1 tablet by mouth 3 (three) times daily as needed.     pregabalin (LYRICA) 75 MG capsule Take 1 capsule (75 mg total) by mouth 2 (two) times daily. 180 capsule 1  promethazine (PHENERGAN) 25 MG tablet Take 1 tablet (25 mg total) by mouth every 6 (six) hours as needed for nausea or vomiting. 60 tablet 3   ticagrelor (BRILINTA) 90 MG TABS tablet Take 1 tablet (90 mg total) by mouth 2 (two) times daily. 180 tablet 3   valsartan-hydrochlorothiazide (DIOVAN-HCT) 160-25 MG tablet Take 1 tablet by mouth daily. 90 tablet 3   No current facility-administered medications for this visit.    Allergies:   Lisinopril and Benadryl [diphenhydramine hcl]    Social History:  The patient  reports that he has been smoking cigarettes. He has a 30.00 pack-year smoking history. He quit smokeless tobacco use about 35 years ago.  His smokeless tobacco use included chew. He reports that he does not drink alcohol and does not use drugs.   Family History:  The patient's ***family history includes Alcohol abuse in his father; Cancer - Colon in his mother; Coronary artery disease in his brother, father, and mother; Diabetes Mellitus II in his father and mother; Heart attack in his brother; Heart attack (age of onset: 61) in his mother; Heart attack (age of onset: 4) in his father; Heart murmur in his sister; Hypertension in his father; Ovarian  cancer in his mother; Uterine cancer in his mother.    ROS:  Please see the history of present illness.   Otherwise, review of systems are positive for ***.   All other systems are reviewed and negative.    PHYSICAL EXAM: VS:  There were no vitals taken for this visit. , BMI There is no height or weight on file to calculate BMI. GEN: Well nourished, well developed, in no acute distress HEENT: normal Neck: no JVD, carotid bruits, or masses Cardiac: ***RRR; no murmurs, rubs, or gallops,no edema  Respiratory:  clear to auscultation bilaterally, normal work of breathing GI: soft, nontender, nondistended, + BS MS: no deformity or atrophy Skin: warm and dry, no rash Neuro:  Strength and sensation are intact Psych: euthymic mood, full affect   EKG:   The ekg ordered today demonstrates ***   Recent Labs: 08/08/2021: ALT 40 08/09/2021: BUN 9; Creatinine, Ser 0.92; Hemoglobin 15.1; Platelets 332; Potassium 4.3; Sodium 134   Lipid Panel    Component Value Date/Time   CHOL 186 08/09/2021 0634   TRIG 175 (H) 08/09/2021 0634   HDL 31 (L) 08/09/2021 0634   CHOLHDL 6.0 08/09/2021 0634   VLDL 35 08/09/2021 0634   LDLCALC 120 (H) 08/09/2021 0634   LDLCALC 155 (H) 04/03/2021 0000     Other studies Reviewed: Additional studies/ records that were reviewed today with results demonstrating: ***.   ASSESSMENT AND PLAN:  CAD:  TObacco abuse: He tried a nonprescription nicotine patch. HTN: Hyperlipidemia:    Current medicines are reviewed at length with the patient today.  The patient concerns regarding his medicines were addressed.  The following changes have been made:  No change***  Labs/ tests ordered today include: *** No orders of the defined types were placed in this encounter.   Recommend 150 minutes/week of aerobic exercise Low fat, low carb, high fiber diet recommended  Disposition:   FU in ***   Signed, Larae Grooms, MD  11/03/2021 10:55 AM    [facility] [address], [place], [place]  [address] Phone: [phone]; Fax: [phone]

## 2021-11-04 ENCOUNTER — Ambulatory Visit: Payer: Medicare Other | Attending: Interventional Cardiology | Admitting: Interventional Cardiology

## 2021-11-04 VITALS — BP 132/86 | HR 82 | Ht 67.0 in | Wt 243.0 lb

## 2021-11-04 DIAGNOSIS — I10 Essential (primary) hypertension: Secondary | ICD-10-CM

## 2021-11-04 DIAGNOSIS — Z955 Presence of coronary angioplasty implant and graft: Secondary | ICD-10-CM | POA: Diagnosis not present

## 2021-11-04 DIAGNOSIS — E785 Hyperlipidemia, unspecified: Secondary | ICD-10-CM

## 2021-11-04 DIAGNOSIS — I2510 Atherosclerotic heart disease of native coronary artery without angina pectoris: Secondary | ICD-10-CM

## 2021-11-04 LAB — COMPREHENSIVE METABOLIC PANEL WITH GFR
ALT: 44 IU/L (ref 0–44)
AST: 37 IU/L (ref 0–40)
Albumin/Globulin Ratio: 1.6 (ref 1.2–2.2)
Albumin: 4.5 g/dL (ref 3.8–4.9)
Alkaline Phosphatase: 76 IU/L (ref 44–121)
BUN/Creatinine Ratio: 10 (ref 9–20)
BUN: 10 mg/dL (ref 6–24)
Bilirubin Total: 0.5 mg/dL (ref 0.0–1.2)
CO2: 26 mmol/L (ref 20–29)
Calcium: 10.3 mg/dL — ABNORMAL HIGH (ref 8.7–10.2)
Chloride: 98 mmol/L (ref 96–106)
Creatinine, Ser: 0.96 mg/dL (ref 0.76–1.27)
Globulin, Total: 2.8 g/dL (ref 1.5–4.5)
Glucose: 173 mg/dL — ABNORMAL HIGH (ref 70–99)
Potassium: 5 mmol/L (ref 3.5–5.2)
Sodium: 135 mmol/L (ref 134–144)
Total Protein: 7.3 g/dL (ref 6.0–8.5)
eGFR: 95 mL/min/1.73 (ref >59)

## 2021-11-04 LAB — LIPID PANEL
Chol/HDL Ratio: 4.8 ratio (ref 0.0–5.0)
Cholesterol, Total: 139 mg/dL (ref 100–199)
HDL: 29 mg/dL — ABNORMAL LOW (ref >39)
LDL Chol Calc (NIH): 92 mg/dL (ref 0–99)
Triglycerides: 95 mg/dL (ref 0–149)
VLDL Cholesterol Cal: 18 mg/dL (ref 5–40)

## 2021-11-04 MED ORDER — VARENICLINE TARTRATE (STARTER) 0.5 MG X 11 & 1 MG X 42 PO TBPK: ORAL_TABLET | ORAL | 0 refills | Status: DC

## 2021-11-04 NOTE — Patient Instructions (Signed)
Medication Instructions:  Your physician has recommended you make the following change in your medication: Start Chantix starter pack.  Follow label instructions   *If you need a refill on your cardiac medications before your next appointment, please call your pharmacy*   Lab Work: Lab work to be done today--CMET and Lipids If you have labs (blood work) drawn today and your tests are completely normal, you will receive your results only by: [place] (if you have MyChart) OR A paper copy in the mail If you have any lab test that is abnormal or we need to change your treatment, we will call you to review the results.   Testing/Procedures: none   Follow-Up: At [facility], you and your health needs are our priority.  As part of our continuing mission to provide you with exceptional heart care, we have created designated Provider Care Teams.  These Care Teams include your primary Cardiologist (physician) and Advanced Practice Providers (APPs -  Physician Assistants and Nurse Practitioners) who all work together to provide you with the care you need, when you need it.  We recommend signing up for the patient portal called "MyChart".  Sign up information is provided on this After Visit Summary.  MyChart is used to connect with patients for Virtual Visits (Telemedicine).  Patients are able to view lab/test results, encounter notes, upcoming appointments, etc.  Non-urgent messages can be sent to your provider as well.   To learn more about what you can do with MyChart, go to [email or web address].    Your next appointment:   9 month(s)  The format for your next appointment:   In Person  Provider:   Larae Grooms, MD     Other Instructions    Important Information About Sugar

## 2021-11-04 NOTE — Addendum Note (Signed)
Addended by: Thompson Grayer on: 11/04/2021 12:11 PM   Modules accepted: Orders

## 2021-11-05 ENCOUNTER — Telehealth: Payer: Self-pay | Admitting: Interventional Cardiology

## 2021-11-05 ENCOUNTER — Telehealth: Payer: Self-pay

## 2021-11-05 DIAGNOSIS — Z79899 Other long term (current) drug therapy: Secondary | ICD-10-CM

## 2021-11-05 DIAGNOSIS — E785 Hyperlipidemia, unspecified: Secondary | ICD-10-CM

## 2021-11-05 MED ORDER — EZETIMIBE 10 MG PO TABS: 10.0000 mg | ORAL_TABLET | Freq: Every day | ORAL | 3 refills | Status: AC

## 2021-11-05 NOTE — Telephone Encounter (Signed)
Patient is returning call to discuss lab results. 

## 2021-11-05 NOTE — Telephone Encounter (Signed)
   Jettie Booze, MD  11/05/2021  9:39 AM EDT     Try to get LDL down to 55.  Add Zetia 10 mg daily.  Can recheck liver and lipids in 3 months.    Tried calling the pt back to endorse lab results and recommendations per Dr. Irish Lack, and he did not answer and voicemail is full at this time.

## 2021-11-05 NOTE — Telephone Encounter (Signed)
Attempted to call patient again, but no answer and voicemail full. Called wife Manuela Schwartz on [place] and spoke with her. She states to go ahead and call in medication and agrees for patient to come in for repeat labs on 02/10/21. Advised her if pt has any questions or concerns to call us.

## 2021-11-05 NOTE — Telephone Encounter (Signed)
-----   Message from Jettie Booze, MD sent at 11/05/2021  9:39 AM EDT ----- Try to get LDL down to 55.  Add Zetia 10 mg daily.  Can recheck liver and lipids in 3 months.

## 2021-11-12 ENCOUNTER — Encounter (HOSPITAL_COMMUNITY): Payer: Self-pay | Admitting: Psychiatry

## 2021-11-12 ENCOUNTER — Telehealth (INDEPENDENT_AMBULATORY_CARE_PROVIDER_SITE_OTHER): Payer: Medicare Other | Admitting: Psychiatry

## 2021-11-12 DIAGNOSIS — F411 Generalized anxiety disorder: Secondary | ICD-10-CM

## 2021-11-12 DIAGNOSIS — F5102 Adjustment insomnia: Secondary | ICD-10-CM

## 2021-11-12 DIAGNOSIS — F331 Major depressive disorder, recurrent, moderate: Secondary | ICD-10-CM

## 2021-11-12 MED ORDER — ESCITALOPRAM OXALATE 10 MG PO TABS: 10.0000 mg | ORAL_TABLET | Freq: Every day | ORAL | 2 refills | Status: DC

## 2021-11-12 NOTE — Progress Notes (Signed)
Patient ID: RANDI COLLEGE, male   DOB: [birth year], 51 y.o.   MRN: [id number] [place] Follow-up Outpatient Visit  KUBA SHEPHERD [id number] 51 y.o.  11/12/2021  Virtual Visit via Video Note  I connected with Fenton Malling on 11/12/21 at  2:00 PM EDT by a video enabled telemedicine application and verified that I am speaking with the correct person using two identifiers.  Location: Patient: home Provider: home office   I discussed the limitations of evaluation and management by telemedicine and the availability of in person appointments. The patient expressed understanding and agreed to proceed.     I discussed the assessment and treatment plan with the patient. The patient was provided an opportunity to ask questions and all were answered. The patient agreed with the plan and demonstrated an understanding of the instructions.   The patient was advised to call back or seek an in-person evaluation if the symptoms worsen or if the condition fails to improve as anticipated.  I provided 115 minutes of non-face-to-face time during this encounter.    Chief Complaint: follow up for depression and anxiety.   History of Present Illness:   Has had CAD /MI in August now on additional BP med, recovering and walking more. Cutting down nicotine on chantix now  Depression wise doing fair on lexapro Anxiety manageable, understands to work on physical health and habits   Aggravating factor: recent MI Modifying factor: home, family Depression manageable   Duration more then 10 years     Past Medical History:  Diagnosis Date   Acute ST elevation myocardial infarction (STEMI) due to occlusion of left anterior descending (LAD) coronary artery ([place]) 08/08/2021   mLAD 100% -> DES PCI Synergy DES 2.75 mm x 16 (3.25 mm); => mRCA90% -> staged PCI.    Anxiety    Arthritis    Asthma    history [id number]   Coronary artery disease involving native coronary artery of native  heart with unstable angina pectoris ([place]) 08/08/2021   Ant STEMI - Culprit 100% mLAD (DES PCI); mRCA 90% (Staged PCI 7/17), OM1 50%.   Depression    Essential hypertension 12/11/2011   Gout    Headache    History of hiatal hernia    History of lithotripsy    Hypertension    Kidney stones    Lumbar degenerative disc disease 12/11/2011   Moderate obstructive sleep apnea 04/21/2021   Nephrolithiasis 12/11/2011   Pericarditis    Renal tubular acidosis    Rheumatoid arthritis(714.0)    Family History  Problem Relation Age of Onset   Hypertension Father    Alcohol abuse Father    Heart attack Father 48   Diabetes Mellitus II Father    Coronary artery disease Father    Diabetes Mellitus II Mother        Martin Majestic into a diabetic coma, per pt.   Cancer - Colon Mother    Coronary artery disease Mother    Heart attack Mother 80   Ovarian cancer Mother    Uterine cancer Mother    Heart attack Brother    Coronary artery disease Brother    Heart murmur Sister    Dementia Neg Hx    Bipolar disorder Neg Hx    Depression Neg Hx    Drug abuse Neg Hx    OCD Neg Hx     Outpatient Encounter Medications as of 11/12/2021  Medication Sig   albuterol (VENTOLIN HFA) 108 (90 Base) MCG/ACT inhaler Inhale  2 puffs into the lungs every 6 (six) hours as needed for wheezing.   aspirin 81 MG chewable tablet Chew 1 tablet (81 mg total) by mouth daily.   atorvastatin (LIPITOR) 80 MG tablet Take 1 tablet (80 mg total) by mouth daily.   blood glucose meter kit and supplies KIT Dispense based on patient and insurance preference. Use up to four times daily as directed. Please include lancets, test strips, control solution.   cyclobenzaprine (FLEXERIL) 10 MG tablet Take 1 tablet (10 mg total) by mouth 3 (three) times daily as needed for muscle spasms.   diclofenac Sodium (VOLTAREN) 1 % GEL Apply 1 Application topically daily as needed (For pain).   escitalopram (LEXAPRO) 10 MG tablet Take 1 tablet (10 mg total)  by mouth daily.   ezetimibe (ZETIA) 10 MG tablet Take 1 tablet (10 mg total) by mouth daily.   fluticasone furoate-vilanterol (BREO ELLIPTA) 200-25 MCG/ACT AEPB Inhale 1 puff into the lungs daily.   loratadine (CLARITIN) 10 MG tablet Take 1 tablet (10 mg total) by mouth daily.   methotrexate (RHEUMATREX) 2.5 MG tablet Take 20 mg by mouth every Tuesday. Caution:Chemotherapy. Protect from light.   Every other tuesday   metoprolol tartrate (LOPRESSOR) 25 MG tablet Take 0.5 tablets (12.5 mg total) by mouth 2 (two) times daily.   Nicotine 21-14-7 MG/24HR KIT Place 1 patch onto the skin daily.   nitroGLYCERIN (NITROSTAT) 0.4 MG SL tablet Place 1 tablet (0.4 mg total) under the tongue every 5 (five) minutes as needed.   omeprazole (PRILOSEC) 40 MG capsule Take 1 capsule (40 mg total) by mouth daily.   oxyCODONE-acetaminophen (PERCOCET) 7.5-325 MG tablet Take 1 tablet by mouth 3 (three) times daily as needed.   pregabalin (LYRICA) 75 MG capsule Take 1 capsule (75 mg total) by mouth 2 (two) times daily.   promethazine (PHENERGAN) 25 MG tablet Take 1 tablet (25 mg total) by mouth every 6 (six) hours as needed for nausea or vomiting.   ticagrelor (BRILINTA) 90 MG TABS tablet Take 1 tablet (90 mg total) by mouth 2 (two) times daily.   valsartan-hydrochlorothiazide (DIOVAN-HCT) 160-25 MG tablet Take 1 tablet by mouth daily.   Varenicline Tartrate, Starter, (CHANTIX STARTING MONTH PAK) 0.5 MG X 11 & 1 MG X 42 TBPK Days 1 to 3: 0.5 mg once daily. Days 4 to 7: 0.5 mg twice daily. Maintenance (day 8 and later): 1 mg twice daily   [DISCONTINUED] escitalopram (LEXAPRO) 10 MG tablet Take 1 tablet (10 mg total) by mouth daily.   No facility-administered encounter medications on file as of 11/12/2021.    Recent Results (from the past 2160 hour(s))  Comp Met (CMET)     Status: Abnormal   Collection Time: 11/04/21 10:35 AM  Result Value Ref Range   Glucose 173 (H) 70 - 99 mg/dL   BUN 10 6 - 24 mg/dL    Creatinine, Ser 0.96 0.76 - 1.27 mg/dL   eGFR 95 >59 mL/min/1.73   BUN/Creatinine Ratio 10 9 - 20   Sodium 135 134 - 144 mmol/L   Potassium 5.0 3.5 - 5.2 mmol/L   Chloride 98 96 - 106 mmol/L   CO2 26 20 - 29 mmol/L   Calcium 10.3 (H) 8.7 - 10.2 mg/dL   Total Protein 7.3 6.0 - 8.5 g/dL   Albumin 4.5 3.8 - 4.9 g/dL   Globulin, Total 2.8 1.5 - 4.5 g/dL   Albumin/Globulin Ratio 1.6 1.2 - 2.2   Bilirubin Total 0.5 0.0 - 1.2  mg/dL   Alkaline Phosphatase 76 44 - 121 IU/L   AST 37 0 - 40 IU/L   ALT 44 0 - 44 IU/L  Lipid Profile     Status: Abnormal   Collection Time: 11/04/21 10:35 AM  Result Value Ref Range   Cholesterol, Total 139 100 - 199 mg/dL   Triglycerides 95 0 - 149 mg/dL   HDL 29 (L) >39 mg/dL   VLDL Cholesterol Cal 18 5 - 40 mg/dL   LDL Chol Calc (NIH) 92 0 - 99 mg/dL   Chol/HDL Ratio 4.8 0.0 - 5.0 ratio    Comment:                                   T. Chol/HDL Ratio                                             Men  Women                               1/2 Avg.Risk  3.4    3.3                                   Avg.Risk  5.0    4.4                                2X Avg.Risk  9.6    7.1                                3X Avg.Risk 23.4   11.0     There were no vitals taken for this visit.   Review of Systems  Cardiovascular:  Negative for chest pain and palpitations.  Skin:  Negative for itching and rash.  Psychiatric/Behavioral:  Negative for depression and suicidal ideas.     Mental Status Examination  Appearance:casual Alert: Yes Attention: fair  Cooperative: Yes Eye Contact: Speech: coherent Psychomotor Activity: Normal Memory/Concentration: adequate Oriented: person, place, time/date and situation Mood: fair Affect:congruent Thought Processes and Associations: Coherent Fund of Knowledge: Fair Thought Content: Suicidal ideation and Homicidal ideation were denied. Denies hallucinations Insight: Fair Judgement: Fair  Diagnosis: Major depressive disorder  recurrent moderate to severe. Generalized anxiety disorder. Insomnia. 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

## 2021-11-19 ENCOUNTER — Ambulatory Visit (INDEPENDENT_AMBULATORY_CARE_PROVIDER_SITE_OTHER): Payer: Medicare Other | Admitting: Medical-Surgical

## 2021-11-19 ENCOUNTER — Encounter: Payer: Self-pay | Admitting: Medical-Surgical

## 2021-11-19 VITALS — BP 126/85 | HR 78 | Ht 67.0 in | Wt 244.0 lb

## 2021-11-19 DIAGNOSIS — Z1211 Encounter for screening for malignant neoplasm of colon: Secondary | ICD-10-CM | POA: Diagnosis not present

## 2021-11-19 DIAGNOSIS — E1169 Type 2 diabetes mellitus with other specified complication: Secondary | ICD-10-CM

## 2021-11-19 DIAGNOSIS — Z23 Encounter for immunization: Secondary | ICD-10-CM | POA: Diagnosis not present

## 2021-11-19 DIAGNOSIS — Z122 Encounter for screening for malignant neoplasm of respiratory organs: Secondary | ICD-10-CM

## 2021-11-19 DIAGNOSIS — Z6837 Body mass index (BMI) 37.0-37.9, adult: Secondary | ICD-10-CM

## 2021-11-19 DIAGNOSIS — E119 Type 2 diabetes mellitus without complications: Secondary | ICD-10-CM | POA: Insufficient documentation

## 2021-11-19 HISTORY — DX: Type 2 diabetes mellitus without complications: E11.9

## 2021-11-19 LAB — POCT GLYCOSYLATED HEMOGLOBIN (HGB A1C): HbA1c, POC (controlled diabetic range): 7.8 % — AB (ref 0.0–7.0)

## 2021-11-19 LAB — POCT UA - MICROALBUMIN
Creatinine, POC: 300 mg/dL
Microalbumin Ur, POC: 80 mg/L

## 2021-11-19 MED ORDER — FREESTYLE LIBRE 2 SENSOR MISC: 1.0000 | 2 refills | Status: DC

## 2021-11-19 MED ORDER — FREESTYLE LIBRE 2 READER DEVI: 1.0000 | Freq: Once | 0 refills | Status: AC

## 2021-11-19 MED ORDER — METFORMIN HCL 500 MG PO TABS: 500.0000 mg | ORAL_TABLET | Freq: Two times a day (BID) | ORAL | 3 refills | Status: DC

## 2021-11-19 NOTE — Progress Notes (Signed)
Established Patient Office Visit  Subjective   Patient ID: [name], male   DOB: 04/02/1970 Age: 51 y.o. MRN: [id number]   Chief Complaint  Patient presents with   Diabetes   HPI Pleasant 51 year old male presenting today for follow-up on diabetes. Diabetes: Type: 2 Medications: None Compliant: NA Side effects: N/A Checking sugars: No, has not gotten a glucometer yet and is hesitant to get 1 that require sticking his fingers.  Would like a prescription for a continuous glucose monitor instead.   Diabetic diet: Aware of recommendations for dietary intake however is not as compliant as he could be. Complications: Abnormal microalbumin Eye exam: UTD, due soon Foot exam: UTD Microalbumin screening: Done today Statin: Yes, Lipitor 80mg  daily  ACE/ARB: Yes, valsartan-HCTZ 160-25 mg daily Last A1c: 6.8% on 08/09/2021  Tobacco abuse: Continues to smoke however he is working on quitting.  Currently taking Chantix that was prescribed for him by cardiology.  Not using nicotine patches at this time.  Due for lung cancer screening CT.   Objective:    Vitals:   11/19/21 0907  BP: 126/85  Pulse: 78  Height: 5\' 7"  (1.702 m)  Weight: 244 lb (110.7 kg)  SpO2: 96%  BMI (Calculated): 38.21    Physical Exam Vitals reviewed.  Constitutional:      General: He is not in acute distress.    Appearance: Normal appearance. He is obese. He is not ill-appearing.  HENT:     Head: Normocephalic.  Cardiovascular:     Rate and Rhythm: Normal rate.     Pulses: Normal pulses.     Heart sounds: Normal heart sounds. No murmur heard.    No friction rub. No gallop.  Pulmonary:     Effort: Pulmonary effort is normal. No respiratory distress.     Breath sounds: Normal breath sounds.  Skin:    General: Skin is warm and dry.  Neurological:     Mental Status: He is alert and oriented to person, place, and time.  Psychiatric:        Mood and Affect: Mood normal.        Behavior: Behavior normal.         Thought Content: Thought content normal.        Judgment: Judgment normal.     Results for orders placed or performed in visit on 11/19/21 (from the past 24 hour(s))  POCT HgB A1C     Status: Abnormal   Collection Time: 11/19/21  9:10 AM  Result Value Ref Range   Hemoglobin A1C     HbA1c POC (<> result, manual entry)     HbA1c, POC (prediabetic range)     HbA1c, POC (controlled diabetic range) 7.8 (A) 0.0 - 7.0 %  POCT UA - Microalbumin     Status: None   Collection Time: 11/19/21  9:15 AM  Result Value Ref Range   Microalbumin Ur, POC 80 mg/L   Creatinine, POC 300 mg/dL   Albumin/Creatinine Ratio, Urine, POC 30-300        The ASCVD Risk score (Arnett DK, et al., 2019) failed to calculate for the following reasons:   The patient has a prior MI or stroke diagnosis   Assessment & Plan:   1. Type 2 diabetes mellitus with other specified complication, without long-term current use of insulin (HCC) POCT hemoglobin A1c 7.8% today.  POCT microalbumin abnormal.  Start metformin 500 mg daily for 1 week then increase to 500 mg twice daily.  Strongly recommend  adherence to a diabetic diet low in simple carbohydrates and concentrated sweets.  Recommend updating eye exam with a full diabetic eye exam when due. - POCT HgB A1C - HM DIABETES FOOT EXAM - POCT UA - Microalbumin  2. Class 2 severe obesity due to excess calories with serious comorbidity and body mass index (BMI) of 37.0 to 37.9 in adult [facility]) Strongly recommend regular intentional exercise with a goal for weight loss.  Limit portions and work on making healthier choices to help with weight loss.  3. Need for immunization against influenza Flu shot given in office today.  4. Colon cancer screening Referring to GI for colonoscopy.  It looks like he was referred in December 2022 however he did not answer his phone or call back to get scheduled so the referral was closed. - Ambulatory referral to Gastroenterology  5.  Screening for lung cancer Ordering CT chest for lung cancer screening. - CT CHEST LUNG CA SCREEN LOW DOSE W/O CM; Future   Return in about 3 months (around 02/19/2022) for DM follow up.  ___________________________________________ Thayer Ohm, DNP, APRN, FNP-BC Primary Care and Sports Medicine [facility] [place]

## 2021-12-08 ENCOUNTER — Other Ambulatory Visit: Payer: Self-pay | Admitting: Interventional Cardiology

## 2021-12-09 ENCOUNTER — Other Ambulatory Visit (HOSPITAL_COMMUNITY): Payer: Self-pay | Admitting: Psychiatry

## 2021-12-10 MED ORDER — VARENICLINE TARTRATE 1 MG PO TABS: 1.0000 mg | ORAL_TABLET | Freq: Two times a day (BID) | ORAL | 1 refills | Status: DC

## 2021-12-10 NOTE — Telephone Encounter (Signed)
Chantix continuing dose sent to pharmacy

## 2021-12-23 ENCOUNTER — Encounter: Payer: Self-pay | Admitting: Medical-Surgical

## 2022-01-07 ENCOUNTER — Other Ambulatory Visit: Payer: Self-pay | Admitting: Interventional Cardiology

## 2022-01-07 NOTE — Telephone Encounter (Signed)
Pt's pharmacy is requesting a refill on varenicline (Chantix). Would Dr. Varanasi like to refill this medication? Please address 

## 2022-01-13 ENCOUNTER — Other Ambulatory Visit (HOSPITAL_COMMUNITY): Payer: Self-pay | Admitting: Psychiatry

## 2022-02-10 ENCOUNTER — Other Ambulatory Visit: Payer: Medicare Other

## 2022-02-18 ENCOUNTER — Telehealth (INDEPENDENT_AMBULATORY_CARE_PROVIDER_SITE_OTHER): Payer: 59 | Admitting: Psychiatry

## 2022-02-18 ENCOUNTER — Encounter (HOSPITAL_COMMUNITY): Payer: Self-pay | Admitting: Psychiatry

## 2022-02-18 DIAGNOSIS — F411 Generalized anxiety disorder: Secondary | ICD-10-CM | POA: Diagnosis not present

## 2022-02-18 DIAGNOSIS — F5102 Adjustment insomnia: Secondary | ICD-10-CM

## 2022-02-18 DIAGNOSIS — F331 Major depressive disorder, recurrent, moderate: Secondary | ICD-10-CM

## 2022-02-18 NOTE — Progress Notes (Signed)
Patient ID: [name], male   DOB: May 17, 1970, 52 y.o.   MRN: [id number] [place] Follow-up Outpatient Visit  [name] [id number] 52 y.o.  02/18/2022  Virtual Visit via Video Note  I connected with [name] on 02/18/22 at  2:00 PM EST by a video enabled telemedicine application and verified that I am speaking with the correct person using two identifiers.  Location: Patient: home Provider: home office I discussed the limitations of evaluation and management by telemedicine and the availability of in person appointments. The patient expressed understanding and agreed to proceed.     I discussed the assessment and treatment plan with the patient. The patient was provided an opportunity to ask questions and all were answered. The patient agreed with the plan and demonstrated an understanding of the instructions.   The patient was advised to call back or seek an in-person evaluation if the symptoms worsen or if the condition fails to improve as anticipated.  I provided 15 minutes  of non-face-to-face time during this encounter.    Chief Complaint: follow up for depression and anxiety.   History of Present Illness:   Has had CAD /MI in August now on additional BP med, recovering and walking more. Cutting down nicotine on chantix now Has cut down smoking , says BP is better but is on diabetic meds Overall goes for a walk and working on acitivities, still on pain meds for arthritis  Anxiety manageable   Aggravating factor: recent MI Modifying factor: home, family Depression manageable   Duration more then 10 years     Past Medical History:  Diagnosis Date   Acute ST elevation myocardial infarction (STEMI) due to occlusion of left anterior descending (LAD) coronary artery ([place]) 08/08/2021   mLAD 100% -> DES PCI Synergy DES 2.75 mm x 16 (3.25 mm); => mRCA90% -> staged PCI.    Anxiety    Arthritis    Asthma    history [id number]   Coronary  artery disease involving native coronary artery of native heart with unstable angina pectoris ([place]) 08/08/2021   Ant STEMI - Culprit 100% mLAD (DES PCI); mRCA 90% (Staged PCI 7/17), OM1 50%.   Depression    Diabetes mellitus ([place]) 11/19/2021   Essential hypertension 12/11/2011   Gout    Headache    History of hiatal hernia    History of lithotripsy    Hypertension    Kidney stones    Lumbar degenerative disc disease 12/11/2011   Moderate obstructive sleep apnea 04/21/2021   Nephrolithiasis 12/11/2011   Pericarditis    Renal tubular acidosis    Rheumatoid arthritis(714.0)    Family History  Problem Relation Age of Onset   Hypertension Father    Alcohol abuse Father    Heart attack Father 28   Diabetes Mellitus II Father    Coronary artery disease Father    Diabetes Mellitus II Mother        Martin Majestic into a diabetic coma, per pt.   Cancer - Colon Mother    Coronary artery disease Mother    Heart attack Mother 53   Ovarian cancer Mother    Uterine cancer Mother    Heart attack Brother    Coronary artery disease Brother    Heart murmur Sister    Dementia Neg Hx    Bipolar disorder Neg Hx    Depression Neg Hx    Drug abuse Neg Hx    OCD Neg Hx  Outpatient Encounter Medications as of 02/18/2022  Medication Sig   albuterol (VENTOLIN HFA) 108 (90 Base) MCG/ACT inhaler Inhale 2 puffs into the lungs every 6 (six) hours as needed for wheezing.   aspirin 81 MG chewable tablet Chew 1 tablet (81 mg total) by mouth daily.   atorvastatin (LIPITOR) 80 MG tablet Take 1 tablet (80 mg total) by mouth daily.   blood glucose meter kit and supplies KIT Dispense based on patient and insurance preference. Use up to four times daily as directed. Please include lancets, test strips, control solution.   Continuous Blood Gluc Sensor (FREESTYLE LIBRE 2 SENSOR) MISC 1 each by Does not apply route every 14 (fourteen) days.   cyclobenzaprine (FLEXERIL) 10 MG tablet Take 1 tablet (10 mg total) by mouth  3 (three) times daily as needed for muscle spasms.   diclofenac Sodium (VOLTAREN) 1 % GEL Apply 1 Application topically daily as needed (For pain).   escitalopram (LEXAPRO) 10 MG tablet TAKE 1 TABLET BY MOUTH EVERY DAY   ezetimibe (ZETIA) 10 MG tablet Take 1 tablet (10 mg total) by mouth daily.   fluticasone furoate-vilanterol (BREO ELLIPTA) 200-25 MCG/ACT AEPB Inhale 1 puff into the lungs daily.   loratadine (CLARITIN) 10 MG tablet Take 1 tablet (10 mg total) by mouth daily.   metFORMIN (GLUCOPHAGE) 500 MG tablet Take 1 tablet (500 mg total) by mouth 2 (two) times daily with a meal.   methotrexate (RHEUMATREX) 2.5 MG tablet Take 20 mg by mouth every Tuesday. Caution:Chemotherapy. Protect from light.   Every other tuesday   metoprolol tartrate (LOPRESSOR) 25 MG tablet Take 0.5 tablets (12.5 mg total) by mouth 2 (two) times daily.   nitroGLYCERIN (NITROSTAT) 0.4 MG SL tablet Place 1 tablet (0.4 mg total) under the tongue every 5 (five) minutes as needed.   omeprazole (PRILOSEC) 40 MG capsule Take 1 capsule (40 mg total) by mouth daily.   oxyCODONE-acetaminophen (PERCOCET) 7.5-325 MG tablet Take 1 tablet by mouth 3 (three) times daily as needed.   pregabalin (LYRICA) 75 MG capsule Take 1 capsule (75 mg total) by mouth 2 (two) times daily.   promethazine (PHENERGAN) 25 MG tablet Take 1 tablet (25 mg total) by mouth every 6 (six) hours as needed for nausea or vomiting.   ticagrelor (BRILINTA) 90 MG TABS tablet Take 1 tablet (90 mg total) by mouth 2 (two) times daily.   valsartan-hydrochlorothiazide (DIOVAN-HCT) 160-25 MG tablet Take 1 tablet by mouth daily.   varenicline (CHANTIX) 1 MG tablet TAKE 1 TABLET BY MOUTH TWICE A DAY   No facility-administered encounter medications on file as of 02/18/2022.    No results found for this or any previous visit (from the past 2160 hour(s)).   There were no vitals taken for this visit.   Review of Systems  Cardiovascular:  Negative for chest pain.   Psychiatric/Behavioral:  Negative for depression and suicidal ideas.     Mental Status Examination  Appearance:casual Alert: Yes Attention: fair  Cooperative: Yes Eye Contact: Speech: coherent Psychomotor Activity: Normal Memory/Concentration: adequate Oriented: person, place, time/date and situation Mood: fair Affect:congruent Thought Processes and Associations: Coherent Fund of Knowledge: Fair Thought Content: Suicidal ideation and Homicidal ideation were denied. Denies hallucinations Insight: Fair Judgement: Fair  Diagnosis: Major depressive disorder recurrent moderate to severe. Generalized anxiety disorder. Insomnia. 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 Meds refill sent if due Merian Capron, MD

## 2022-02-19 ENCOUNTER — Encounter: Payer: Self-pay | Admitting: Medical-Surgical

## 2022-02-19 ENCOUNTER — Ambulatory Visit (INDEPENDENT_AMBULATORY_CARE_PROVIDER_SITE_OTHER): Payer: 59 | Admitting: Medical-Surgical

## 2022-02-19 VITALS — BP 164/83 | HR 72 | Resp 20 | Ht 67.0 in | Wt 247.0 lb

## 2022-02-19 DIAGNOSIS — F17200 Nicotine dependence, unspecified, uncomplicated: Secondary | ICD-10-CM

## 2022-02-19 DIAGNOSIS — G894 Chronic pain syndrome: Secondary | ICD-10-CM

## 2022-02-19 DIAGNOSIS — E1169 Type 2 diabetes mellitus with other specified complication: Secondary | ICD-10-CM | POA: Diagnosis not present

## 2022-02-19 DIAGNOSIS — Z1211 Encounter for screening for malignant neoplasm of colon: Secondary | ICD-10-CM

## 2022-02-19 DIAGNOSIS — I10 Essential (primary) hypertension: Secondary | ICD-10-CM | POA: Diagnosis not present

## 2022-02-19 DIAGNOSIS — R0602 Shortness of breath: Secondary | ICD-10-CM | POA: Diagnosis not present

## 2022-02-19 MED ORDER — OMEPRAZOLE 40 MG PO CPDR: 40.0000 mg | DELAYED_RELEASE_CAPSULE | Freq: Every day | ORAL | 3 refills | Status: DC

## 2022-02-19 MED ORDER — OZEMPIC (0.25 OR 0.5 MG/DOSE) 2 MG/3ML ~~LOC~~ SOPN: 0.2500 mg | PEN_INJECTOR | SUBCUTANEOUS | 0 refills | Status: DC

## 2022-02-19 MED ORDER — CYCLOBENZAPRINE HCL 10 MG PO TABS: 10.0000 mg | ORAL_TABLET | Freq: Three times a day (TID) | ORAL | 5 refills | Status: DC | PRN

## 2022-02-19 MED ORDER — ALBUTEROL SULFATE HFA 108 (90 BASE) MCG/ACT IN AERS: 2.0000 | INHALATION_SPRAY | Freq: Four times a day (QID) | RESPIRATORY_TRACT | 11 refills | Status: DC | PRN

## 2022-02-19 NOTE — Progress Notes (Signed)
Established Patient Office Visit  Subjective   Patient ID: JAASIEL HOLLYFIELD, male   DOB: May 05, 1970 Age: 52 y.o. MRN: [id number]   Chief Complaint  Patient presents with   Follow-up   Diabetes    HPI Pleasant 52 year old male accompanied by his sister-in-law presenting today for the following:  Diabetes: Has been taking metformin 500 mg twice daily as prescribed.  Unfortunately, the medication seems to cause nausea and he is interested in coming off of the metformin.  He tried to get a continuous glucose monitor however his insurance will not cover it.  He does have a glucometer that he occasionally uses but not as often as he should because he does not like to have to prick his fingers.  Working on following a diabetic diet but likely has room for improvement.  Hypertension: He is followed closely by cardiology.  Notes that he checks his blood pressure at home and he gets variable readings.  He went to the doctor's office the other day and his blood pressure was perfect but today it is elevated.  He is taking his medications as prescribed, tolerating well without side effects.  Does note that for the last 3 to 4 days, he has been experiencing an intermittent sharp, stabbing pain in the left chest that radiates to the left arm.  This does not seem to be related to any particular activity and does not seem to have a pattern.  Last night, he had an episode of breaking into a cold sweat.  The pain is very short-lived and lasts only 1 to 2 seconds for resolution.  Notes that the pain improves with certain positioning but has been unpredictable.  He has not seen cardiology recently.  He has not contacted them regarding his new symptoms.   Objective:    Vitals:   02/19/22 1002 02/19/22 1005  BP: (!) 149/88 (!) 164/83  Pulse: 72 72  Resp: 20 20  Height: 5\' 7"  (1.702 m)   Weight: 247 lb 0.6 oz (112.1 kg)   SpO2: 97% 96%  BMI (Calculated): 38.68     Physical Exam Vitals reviewed.   Constitutional:      General: He is not in acute distress.    Appearance: Normal appearance. He is obese. He is not ill-appearing.  HENT:     Head: Normocephalic and atraumatic.  Cardiovascular:     Rate and Rhythm: Normal rate and regular rhythm.     Pulses: Normal pulses.     Heart sounds: Normal heart sounds. No murmur heard.    No friction rub. No gallop.  Pulmonary:     Effort: Pulmonary effort is normal. No respiratory distress.     Breath sounds: Normal breath sounds.  Skin:    General: Skin is warm and dry.  Neurological:     Mental Status: He is alert and oriented to person, place, and time.  Psychiatric:        Mood and Affect: Mood normal.        Behavior: Behavior normal.        Thought Content: Thought content normal.        Judgment: Judgment normal.   No results found for this or any previous visit (from the past 24 hour(s)).     The ASCVD Risk score (Arnett DK, et al., 2019) failed to calculate for the following reasons:   The patient has a prior MI or stroke diagnosis   Assessment & Plan:   1. SOB (shortness of  breath) Refilling albuterol inhaler. - albuterol (VENTOLIN HFA) 108 (90 Base) MCG/ACT inhaler; Inhale 2 puffs into the lungs every 6 (six) hours as needed for wheezing.  Dispense: 2 each; Refill: 11  2. Chronic pain syndrome Refilling Flexeril 3 times daily as needed. - cyclobenzaprine (FLEXERIL) 10 MG tablet; Take 1 tablet (10 mg total) by mouth 3 (three) times daily as needed for muscle spasms.  Dispense: 90 tablet; Refill: 5  3. Colon cancer screening Referring to GI for colonoscopy. - Ambulatory referral to Gastroenterology  4. Smoker Reviewed smoking history.  He meets criteria so ordering CT lung cancer screening today. - CT CHEST LUNG CA SCREEN LOW DOSE W/O CM; Future  5. Type 2 diabetes mellitus with other specified complication, without long-term current use of insulin (HCC) Checking hemoglobin A1c.  Starting Ozempic 0.25 mg weekly x  4 with an increase to 0.5 mg if well-tolerated.  Sample given in office with instructions on use.  If he is doing well in a few weeks, we will go ahead and send this to the pharmacy so any prior authorization can be initiated. - Hemoglobin A1c  6. Hypertension BP elevated with concerning reports of atypical chest pain. Recommend contacting Cardiology to see what their recommendations are since I am almost positive they will want to do further evaluation and/or medication management.   Return in about 3 months (around 05/21/2022) for DM follow up.  ___________________________________________ Clearnce Sorrel, DNP, APRN, FNP-BC Primary Care and [place]

## 2022-02-20 LAB — HEMOGLOBIN A1C
Hgb A1c MFr Bld: 7.9 % of total Hgb — ABNORMAL HIGH (ref <5.7)
Mean Plasma Glucose: 180 mg/dL
eAG (mmol/L): 10 mmol/L

## 2022-02-27 ENCOUNTER — Ambulatory Visit (INDEPENDENT_AMBULATORY_CARE_PROVIDER_SITE_OTHER): Payer: 59

## 2022-02-27 VITALS — Ht 67.0 in | Wt 247.0 lb

## 2022-02-27 DIAGNOSIS — R918 Other nonspecific abnormal finding of lung field: Secondary | ICD-10-CM

## 2022-02-27 DIAGNOSIS — J432 Centrilobular emphysema: Secondary | ICD-10-CM

## 2022-02-27 DIAGNOSIS — F17210 Nicotine dependence, cigarettes, uncomplicated: Secondary | ICD-10-CM

## 2022-02-27 DIAGNOSIS — F17200 Nicotine dependence, unspecified, uncomplicated: Secondary | ICD-10-CM

## 2022-03-02 ENCOUNTER — Encounter: Payer: Self-pay | Admitting: Medical-Surgical

## 2022-03-02 DIAGNOSIS — K760 Fatty (change of) liver, not elsewhere classified: Secondary | ICD-10-CM | POA: Insufficient documentation

## 2022-03-02 DIAGNOSIS — J439 Emphysema, unspecified: Secondary | ICD-10-CM | POA: Insufficient documentation

## 2022-03-03 ENCOUNTER — Other Ambulatory Visit: Payer: Self-pay

## 2022-03-26 ENCOUNTER — Encounter: Payer: Self-pay | Admitting: Gastroenterology

## 2022-03-26 ENCOUNTER — Ambulatory Visit (INDEPENDENT_AMBULATORY_CARE_PROVIDER_SITE_OTHER): Payer: 59 | Admitting: Gastroenterology

## 2022-03-26 VITALS — BP 140/86 | HR 84 | Ht 67.0 in | Wt 243.1 lb

## 2022-03-26 DIAGNOSIS — I25110 Atherosclerotic heart disease of native coronary artery with unstable angina pectoris: Secondary | ICD-10-CM

## 2022-03-26 DIAGNOSIS — Z1211 Encounter for screening for malignant neoplasm of colon: Secondary | ICD-10-CM | POA: Diagnosis not present

## 2022-03-26 DIAGNOSIS — E1169 Type 2 diabetes mellitus with other specified complication: Secondary | ICD-10-CM

## 2022-03-26 DIAGNOSIS — I10 Essential (primary) hypertension: Secondary | ICD-10-CM

## 2022-03-26 DIAGNOSIS — I2102 ST elevation (STEMI) myocardial infarction involving left anterior descending coronary artery: Secondary | ICD-10-CM | POA: Diagnosis not present

## 2022-03-26 DIAGNOSIS — Z800 Family history of malignant neoplasm of digestive organs: Secondary | ICD-10-CM | POA: Diagnosis not present

## 2022-03-26 NOTE — Patient Instructions (Addendum)
It has been recommended to you by your physician that you have a colonoscopy completed. Please contact our office at [phone] after your follow up with Cardiology Clinic in July.You will be scheduled for a pre-visit and procedure at that time.    _______________________________________________________  If your blood pressure at your visit was 140/90 or greater, please contact your primary care physician to follow up on this.  _______________________________________________________  If you are age 8 or younger, your body mass index should be between 19-25. Your Body mass index is 38.08 kg/m. If this is out of the aformentioned range listed, please consider follow up with your Primary Care Provider.   __________________________________________________________  The [facility] providers would like to encourage you to use [facility] to communicate with providers for non-urgent requests or questions.  Due to long hold times on the telephone, sending your provider a message by [facility] may be a faster and more efficient way to get a response.  Please allow 48 business hours for a response.  Please remember that this is for non-urgent requests.   Thank you for choosing me and [facility].  Vito Cirigliano, D.O.

## 2022-03-26 NOTE — Progress Notes (Signed)
Chief Complaint: Colon cancer screening, medication management   Referring Provider:     Samuel Bouche, NP   HPI:     [name] is a 52 y.o. male with a history of CAD with STEMI 07/2021  s/p PCI with DES (on ASA 81 mg and Brilinta), HTN, HLD, diabetes, depression, anxiety, gout, chronic pain syndrome, tobacco use, nephrolithiasis, OSA, hemorrhoid surgery, referred to the Gastroenterology Clinic for evaluation of colon cancer screening and medication management in the perioperative setting.  No previous colonoscopy or colon cancer screening.  No GI symptoms.  Denies hematochezia, melena, change in bowel habits, abdominal pain, n/v/f/c.  Fhx notable for mother with Colon Cancer age <60.   As above, history of CAD with STEMI in 07/2021 requiring PCI with DES to LAD x 1, DES to RCA x 1 and started on ASA/Brilinta.  Follows in the Cardiology Clinic, last seen by Dr. Irish Lack 11/04/2021 with plan for follow-up in 07/2022.   For his diabetes, was prescribed Ozempic last month.  Does injections on Friday evenings.    Past Medical History:  Diagnosis Date   Acute ST elevation myocardial infarction (STEMI) due to occlusion of left anterior descending (LAD) coronary artery (HCC) 08/08/2021   mLAD 100% -> DES PCI Synergy DES 2.75 mm x 16 (3.25 mm); => mRCA90% -> staged PCI.    Anxiety    Arthritis    Asthma    history [id number]   Coronary artery disease involving native coronary artery of native heart with unstable angina pectoris ([place]) 08/08/2021   Ant STEMI - Culprit 100% mLAD (DES PCI); mRCA 90% (Staged PCI 7/17), OM1 50%.   Depression    Diabetes mellitus ([place]) 11/19/2021   Essential hypertension 12/11/2011   Gout    Headache    History of hiatal hernia    History of lithotripsy    Hypertension    Kidney stones    Lumbar degenerative disc disease 12/11/2011   Moderate obstructive sleep apnea 04/21/2021   Nephrolithiasis 12/11/2011   Pericarditis    Renal  tubular acidosis    Rheumatoid arthritis(714.0)      Past Surgical History:  Procedure Laterality Date   ANTERIOR CERVICAL DECOMP/DISCECTOMY FUSION     ANTERIOR CERVICAL DECOMP/DISCECTOMY FUSION N/A 11/25/2017   Procedure: Anterior Cervical Discectomy Fusion - Cervical Six-Cervical Seven, removal Cervical Five-Six plate;  Surgeon: Eustace Moore, MD;  Location: [place];  Service: Neurosurgery;  Laterality: N/A;  Anterior Cervical Discectomy Fusion - Cervical Six-Cervical Seven, removal Cervical Five-Six plate   CARPAL TUNNEL RELEASE Left 11/06/2015   Procedure: LEFT WRIST CARPAL TUNNEL RELEASE;  Surgeon: Iran Planas, MD;  Location: [place];  Service: Orthopedics;  Laterality: Left;   CORONARY STENT INTERVENTION N/A 08/11/2021   Procedure: CORONARY STENT INTERVENTION;  Surgeon: Jettie Booze, MD;  Location: [place] [facility];  Service: Cardiovascular;  Laterality: N/A;   CORONARY/GRAFT ACUTE MI REVASCULARIZATION N/A 08/08/2021   Procedure: Coronary/Graft Acute MI Revascularization;  Surgeon: Jettie Booze, MD;  Location: [place] [facility];  Service: Cardiovascular;  Laterality: N/A;   HEMORROIDECTOMY     INTRAVASCULAR ULTRASOUND/IVUS N/A 08/08/2021   Procedure: Intravascular Ultrasound/IVUS;  Surgeon: Jettie Booze, MD;  Location: [place] [facility];  Service: Cardiovascular;  Laterality: N/A;   KIDNEY STONE SURGERY     LEFT HEART CATH AND CORONARY ANGIOGRAPHY N/A 08/08/2021   Procedure: LEFT HEART CATH AND CORONARY ANGIOGRAPHY;  Surgeon: Irish Lack,  Charlann Lange, MD;  Location: [place] [facility];  Service: Cardiovascular;  Laterality: N/A;   LEFT HEART CATH AND CORONARY ANGIOGRAPHY N/A 08/11/2021   Procedure: LEFT HEART CATH AND CORONARY ANGIOGRAPHY;  Surgeon: Jettie Booze, MD;  Location: [place] [facility];  Service: Cardiovascular;  Laterality: N/A;   WRIST FUSION Left 11/06/2015   Procedure: ARTHRODESIS LEFT WRIST;  Surgeon: Iran Planas, MD;  Location: [place];   Service: Orthopedics;  Laterality: Left;   Family History  Problem Relation Age of Onset   Hypertension Father    Alcohol abuse Father    Heart attack Father 61   Diabetes Mellitus II Father    Coronary artery disease Father    Diabetes Mellitus II Mother        Martin Majestic into a diabetic coma, per pt.   Cancer - Colon Mother    Coronary artery disease Mother    Heart attack Mother 71   Ovarian cancer Mother    Uterine cancer Mother    Heart attack Brother    Coronary artery disease Brother    Heart murmur Sister    Dementia Neg Hx    Bipolar disorder Neg Hx    Depression Neg Hx    Drug abuse Neg Hx    OCD Neg Hx    Social History   Tobacco Use   Smoking status: Every Day    Packs/day: 1.00    Years: 30.00    Total pack years: 30.00    Types: Cigarettes   Smokeless tobacco: Former    Types: Chew    Quit date: 1988   Tobacco comments:    Rx written for Chantix from Dr. Sheppard Coil.  Vaping Use   Vaping Use: Never used  Substance Use Topics   Alcohol use: No   Drug use: No   Current Outpatient Medications  Medication Sig Dispense Refill   albuterol (VENTOLIN HFA) 108 (90 Base) MCG/ACT inhaler Inhale 2 puffs into the lungs every 6 (six) hours as needed for wheezing. 2 each 11   aspirin 81 MG chewable tablet Chew 1 tablet (81 mg total) by mouth daily. 90 tablet 2   atorvastatin (LIPITOR) 80 MG tablet Take 1 tablet (80 mg total) by mouth daily. 90 tablet 3   blood glucose meter kit and supplies KIT Dispense based on patient and insurance preference. Use up to four times daily as directed. Please include lancets, test strips, control solution. 1 each 0   cyclobenzaprine (FLEXERIL) 10 MG tablet Take 1 tablet (10 mg total) by mouth 3 (three) times daily as needed for muscle spasms. 90 tablet 5   diclofenac Sodium (VOLTAREN) 1 % GEL Apply 1 Application topically daily as needed (For pain).     escitalopram (LEXAPRO) 10 MG tablet TAKE 1 TABLET BY MOUTH EVERY DAY 90 tablet 0    ezetimibe (ZETIA) 10 MG tablet Take 1 tablet (10 mg total) by mouth daily. 90 tablet 3   fluticasone furoate-vilanterol (BREO ELLIPTA) 200-25 MCG/ACT AEPB Inhale 1 puff into the lungs daily. 1 each 11   loratadine (CLARITIN) 10 MG tablet Take 1 tablet (10 mg total) by mouth daily. 90 tablet 3   metFORMIN (GLUCOPHAGE) 500 MG tablet Take 1 tablet (500 mg total) by mouth 2 (two) times daily with a meal. 180 tablet 3   metoprolol tartrate (LOPRESSOR) 25 MG tablet Take 0.5 tablets (12.5 mg total) by mouth 2 (two) times daily. 90 tablet 3   nitroGLYCERIN (NITROSTAT) 0.4 MG SL tablet Place  1 tablet (0.4 mg total) under the tongue every 5 (five) minutes as needed. 25 tablet 2   omeprazole (PRILOSEC) 40 MG capsule Take 1 capsule (40 mg total) by mouth daily. 90 capsule 3   oxyCODONE-acetaminophen (PERCOCET) 7.5-325 MG tablet Take 1 tablet by mouth 3 (three) times daily as needed.     pregabalin (LYRICA) 75 MG capsule Take 1 capsule (75 mg total) by mouth 2 (two) times daily. 180 capsule 1   promethazine (PHENERGAN) 25 MG tablet Take 1 tablet (25 mg total) by mouth every 6 (six) hours as needed for nausea or vomiting. 60 tablet 3   Semaglutide,0.25 or 0.'5MG'$ /DOS, (OZEMPIC, 0.25 OR 0.5 MG/DOSE,) 2 MG/3ML SOPN Inject 0.25 mg into the skin once a week. 3 mL 0   ticagrelor (BRILINTA) 90 MG TABS tablet Take 1 tablet (90 mg total) by mouth 2 (two) times daily. 180 tablet 3   valsartan-hydrochlorothiazide (DIOVAN-HCT) 160-25 MG tablet Take 1 tablet by mouth daily. 90 tablet 3   varenicline (CHANTIX) 1 MG tablet TAKE 1 TABLET BY MOUTH TWICE A DAY 180 tablet 1   No current facility-administered medications for this visit.   Allergies  Allergen Reactions   Lisinopril Cough   Benadryl [Diphenhydramine Hcl] Anxiety     Review of Systems: All systems reviewed and negative except where noted in HPI.     Physical Exam:    Wt Readings from Last 3 Encounters:  03/26/22 243 lb 2 oz (110.3 kg)  02/27/22 247 lb  (112 kg)  02/19/22 247 lb 0.6 oz (112.1 kg)    BP (!) 140/86   Pulse 84   Ht '5\' 7"'$  (1.702 m)   Wt 243 lb 2 oz (110.3 kg)   BMI 38.08 kg/m  Constitutional:  Pleasant, in no acute distress. Psychiatric: Normal mood and affect. Behavior is normal. Cardiovascular: Normal rate, regular rhythm. No edema Pulmonary/chest: Effort normal and breath sounds normal. No wheezing, rales or rhonchi. Abdominal: Soft, nondistended, nontender. Bowel sounds active throughout. There are no masses palpable. No hepatomegaly. Neurological: Alert and oriented to person place and time. Skin: Skin is warm and dry. No rashes noted.   ASSESSMENT AND PLAN;   1) Colon cancer screening 2) Family history of colon cancer Due for CRC screening.  No previous CRC screening.  Discussed screening options today to include optical colonoscopy, virtual colonoscopy, Cologuard, FIT kit testing, and he would like to proceed with optical colonoscopy with appropriate timing as below.  - Will plan to schedule colonoscopy sometime after 07/2022 allowing 1 year after STEMI and DES placement x 2 - Is planning for follow-up in the Cardiology Clinic in 07/2022.  I have asked him to contact our office after that appointment and we can review setting up colonoscopy along with holding Brilinta as appropriate after that time (ASA 81 mg ok in perioperative setting) - If still on Ozempic, will need to hold at least 1 week prior to starting bowel preparation (takes on Fridays)  3) CAD with history of STEMI 4) HTN - As above, patient will call after his Cardiology follow-up in July to start coordinating colonoscopy and medication management in the perioperative setting  5) Diabetes - As above, will hold Ozempic 1 week prior.  Since he does injections on Friday evening, conceivable that he can schedule the procedure to be done on a Friday without needing to hold therapy - Continue follow-up with PCM     Lavena Bullion, DO, FACG   03/26/2022, 8:37 AM  Samuel Bouche, NP

## 2022-03-29 NOTE — Progress Notes (Unsigned)
Synopsis: Referred in March 2024 for pulmonary nodule by Samuel Bouche, NP  Subjective:   PATIENT ID: [name] GENDER: male DOB: 08-Jul-1970, MRN: [id number]  No chief complaint on file.   This is a 52 year old gentleman, past medical history of STEMI in July 2023, currently on Brilinta.  Has a history of hypertension and rheumatoid arthritis.  Patient underwent left heart catheterization and stent placement.  Current every day smoker for the past 30+ years.Patient had a lung cancer screening CT on 02/27/2022 which revealed a right apical linear density.  Malignancy could not be excluded.  Patient was referred for evaluation    ***  Past Medical History:  Diagnosis Date   Acute ST elevation myocardial infarction (STEMI) due to occlusion of left anterior descending (LAD) coronary artery (HCC) 08/08/2021   mLAD 100% -> DES PCI Synergy DES 2.75 mm x 16 (3.25 mm); => mRCA90% -> staged PCI.    Anxiety    Arthritis    Asthma    history [id number]   Coronary artery disease involving native coronary artery of native heart with unstable angina pectoris ([place]) 08/08/2021   Ant STEMI - Culprit 100% mLAD (DES PCI); mRCA 90% (Staged PCI 7/17), OM1 50%.   Depression    Diabetes mellitus ([place]) 11/19/2021   Essential hypertension 12/11/2011   Gout    Headache    History of hiatal hernia    History of lithotripsy    Hypertension    Kidney stones    Lumbar degenerative disc disease 12/11/2011   Moderate obstructive sleep apnea 04/21/2021   Nephrolithiasis 12/11/2011   Pericarditis    Renal tubular acidosis    Rheumatoid arthritis(714.0)      Family History  Problem Relation Age of Onset   Hypertension Father    Alcohol abuse Father    Heart attack Father 9   Diabetes Mellitus II Father    Coronary artery disease Father    Diabetes Mellitus II Mother        Martin Majestic into a diabetic coma, per pt.   Cancer - Colon Mother    Coronary artery disease Mother    Heart attack Mother 55    Ovarian cancer Mother    Uterine cancer Mother    Heart attack Brother    Coronary artery disease Brother    Heart murmur Sister    Dementia Neg Hx    Bipolar disorder Neg Hx    Depression Neg Hx    Drug abuse Neg Hx    OCD Neg Hx      Past Surgical History:  Procedure Laterality Date   ANTERIOR CERVICAL DECOMP/DISCECTOMY FUSION     ANTERIOR CERVICAL DECOMP/DISCECTOMY FUSION N/A 11/25/2017   Procedure: Anterior Cervical Discectomy Fusion - Cervical Six-Cervical Seven, removal Cervical Five-Six plate;  Surgeon: Eustace Moore, MD;  Location: [facility];  Service: Neurosurgery;  Laterality: N/A;  Anterior Cervical Discectomy Fusion - Cervical Six-Cervical Seven, removal Cervical Five-Six plate   CARPAL TUNNEL RELEASE Left 11/06/2015   Procedure: LEFT WRIST CARPAL TUNNEL RELEASE;  Surgeon: Iran Planas, MD;  Location: [facility];  Service: Orthopedics;  Laterality: Left;   CORONARY STENT INTERVENTION N/A 08/11/2021   Procedure: CORONARY STENT INTERVENTION;  Surgeon: Jettie Booze, MD;  Location: [facility];  Service: Cardiovascular;  Laterality: N/A;   CORONARY/GRAFT ACUTE MI REVASCULARIZATION N/A 08/08/2021   Procedure: Coronary/Graft Acute MI Revascularization;  Surgeon: Jettie Booze, MD;  Location: [facility];  Service: Cardiovascular;  Laterality: N/A;  HEMORROIDECTOMY     INTRAVASCULAR ULTRASOUND/IVUS N/A 08/08/2021   Procedure: Intravascular Ultrasound/IVUS;  Surgeon: Jettie Booze, MD;  Location: [facility];  Service: Cardiovascular;  Laterality: N/A;   KIDNEY STONE SURGERY     LEFT HEART CATH AND CORONARY ANGIOGRAPHY N/A 08/08/2021   Procedure: LEFT HEART CATH AND CORONARY ANGIOGRAPHY;  Surgeon: Jettie Booze, MD;  Location: [facility];  Service: Cardiovascular;  Laterality: N/A;   LEFT HEART CATH AND CORONARY ANGIOGRAPHY N/A 08/11/2021   Procedure: LEFT HEART CATH AND CORONARY ANGIOGRAPHY;  Surgeon: Jettie Booze, MD;   Location: [facility];  Service: Cardiovascular;  Laterality: N/A;   WRIST FUSION Left 11/06/2015   Procedure: ARTHRODESIS LEFT WRIST;  Surgeon: Iran Planas, MD;  Location: [facility];  Service: Orthopedics;  Laterality: Left;    Social History   Socioeconomic History   Marital status: Married    Spouse name: Manuela Schwartz   Number of children: 4   Years of education: 9th grade   Highest education level: 9th grade  Occupational History    Comment: Legally disabled  Tobacco Use   Smoking status: Every Day    Packs/day: 1.00    Years: 30.00    Total pack years: 30.00    Types: Cigarettes   Smokeless tobacco: Former    Types: Chew    Quit date: 1988   Tobacco comments:    Rx written for Chantix from Dr. Sheppard Coil.  Vaping Use   Vaping Use: Never used  Substance and Sexual Activity   Alcohol use: No   Drug use: No   Sexual activity: Yes    Partners: Female  Other Topics Concern   Not on file  Social History Narrative   Lives with his family. He walks daily, four times a day with his dog. He enjoys fishing and camping.   Social Determinants of Health   Financial Resource Strain: Low Risk  (06/17/2020)   Overall Financial Resource Strain (CARDIA)    Difficulty of Paying Living Expenses: Not hard at all  Food Insecurity: No Food Insecurity (06/17/2020)   Hunger Vital Sign    Worried About Running Out of Food in the Last Year: Never true    Ran Out of Food in the Last Year: Never true  Transportation Needs: No Transportation Needs (06/17/2020)   PRAPARE - Hydrologist (Medical): No    Lack of Transportation (Non-Medical): No  Physical Activity: Inactive (06/17/2020)   Exercise Vital Sign    Days of Exercise per Week: 0 days    Minutes of Exercise per Session: 0 min  Stress: No Stress Concern Present (06/17/2020)   August    Feeling of Stress : Not at all  Social Connections:  Moderately Isolated (06/17/2020)   Social Connection and Isolation Panel [NHANES]    Frequency of Communication with Friends and Family: More than three times a week    Frequency of Social Gatherings with Friends and Family: More than three times a week    Attends Religious Services: Never    Marine scientist or Organizations: No    Attends Archivist Meetings: Never    Marital Status: Married  Human resources officer Violence: Not At Risk (06/17/2020)   Humiliation, Afraid, Rape, and Kick questionnaire    Fear of Current or Ex-Partner: No    Emotionally Abused: No    Physically Abused: No    Sexually  Abused: No     Allergies  Allergen Reactions   Lisinopril Cough   Benadryl [Diphenhydramine Hcl] Anxiety     Outpatient Medications Prior to Visit  Medication Sig Dispense Refill   albuterol (VENTOLIN HFA) 108 (90 Base) MCG/ACT inhaler Inhale 2 puffs into the lungs every 6 (six) hours as needed for wheezing. 2 each 11   aspirin 81 MG chewable tablet Chew 1 tablet (81 mg total) by mouth daily. 90 tablet 2   atorvastatin (LIPITOR) 80 MG tablet Take 1 tablet (80 mg total) by mouth daily. 90 tablet 3   blood glucose meter kit and supplies KIT Dispense based on patient and insurance preference. Use up to four times daily as directed. Please include lancets, test strips, control solution. 1 each 0   cyclobenzaprine (FLEXERIL) 10 MG tablet Take 1 tablet (10 mg total) by mouth 3 (three) times daily as needed for muscle spasms. 90 tablet 5   diclofenac Sodium (VOLTAREN) 1 % GEL Apply 1 Application topically daily as needed (For pain).     escitalopram (LEXAPRO) 10 MG tablet TAKE 1 TABLET BY MOUTH EVERY DAY 90 tablet 0   ezetimibe (ZETIA) 10 MG tablet Take 1 tablet (10 mg total) by mouth daily. 90 tablet 3   fluticasone furoate-vilanterol (BREO ELLIPTA) 200-25 MCG/ACT AEPB Inhale 1 puff into the lungs daily. 1 each 11   loratadine (CLARITIN) 10 MG tablet Take 1 tablet (10 mg total) by  mouth daily. 90 tablet 3   metFORMIN (GLUCOPHAGE) 500 MG tablet Take 1 tablet (500 mg total) by mouth 2 (two) times daily with a meal. 180 tablet 3   metoprolol tartrate (LOPRESSOR) 25 MG tablet Take 0.5 tablets (12.5 mg total) by mouth 2 (two) times daily. 90 tablet 3   nitroGLYCERIN (NITROSTAT) 0.4 MG SL tablet Place 1 tablet (0.4 mg total) under the tongue every 5 (five) minutes as needed. 25 tablet 2   omeprazole (PRILOSEC) 40 MG capsule Take 1 capsule (40 mg total) by mouth daily. 90 capsule 3   oxyCODONE-acetaminophen (PERCOCET) 7.5-325 MG tablet Take 1 tablet by mouth 3 (three) times daily as needed.     pregabalin (LYRICA) 75 MG capsule Take 1 capsule (75 mg total) by mouth 2 (two) times daily. 180 capsule 1   promethazine (PHENERGAN) 25 MG tablet Take 1 tablet (25 mg total) by mouth every 6 (six) hours as needed for nausea or vomiting. 60 tablet 3   Semaglutide,0.25 or 0.'5MG'$ /DOS, (OZEMPIC, 0.25 OR 0.5 MG/DOSE,) 2 MG/3ML SOPN Inject 0.25 mg into the skin once a week. 3 mL 0   ticagrelor (BRILINTA) 90 MG TABS tablet Take 1 tablet (90 mg total) by mouth 2 (two) times daily. 180 tablet 3   valsartan-hydrochlorothiazide (DIOVAN-HCT) 160-25 MG tablet Take 1 tablet by mouth daily. 90 tablet 3   varenicline (CHANTIX) 1 MG tablet TAKE 1 TABLET BY MOUTH TWICE A DAY 180 tablet 1   No facility-administered medications prior to visit.    ROS   Objective:  Physical Exam   There were no vitals filed for this visit.   on *** LPM *** RA BMI Readings from Last 3 Encounters:  03/26/22 38.08 kg/m  02/27/22 38.69 kg/m  02/19/22 38.69 kg/m   Wt Readings from Last 3 Encounters:  03/26/22 243 lb 2 oz (110.3 kg)  02/27/22 247 lb (112 kg)  02/19/22 247 lb 0.6 oz (112.1 kg)     CBC    Component Value Date/Time   WBC 11.0 (H) 08/09/2021 [id number]  RBC 4.99 08/09/2021 0634   HGB 15.1 08/09/2021 0634   HCT 43.2 08/09/2021 0634   PLT 332 08/09/2021 0634   MCV 86.6 08/09/2021 0634   MCH 30.3  08/09/2021 0634   MCHC 35.0 08/09/2021 0634   RDW 13.4 08/09/2021 0634   LYMPHSABS 2.8 11/24/2017 1117   MONOABS 0.8 11/24/2017 1117   EOSABS 0.2 11/24/2017 1117   BASOSABS 0.0 11/24/2017 1117     Chest Imaging: Lung cancer screening CT 02/27/2022: Right upper lobe linear nodule. Will need additional follow-up, concern for malignancy. The patient's images have been independently reviewed by me.    Pulmonary Functions Testing Results:     No data to display          FeNO:   Pathology:   Echocardiogram:   Heart Catheterization:     Assessment & Plan:     ICD-10-CM   1. Right upper lobe pulmonary nodule  R91.1       Discussion: ***   Current Outpatient Medications:    albuterol (VENTOLIN HFA) 108 (90 Base) MCG/ACT inhaler, Inhale 2 puffs into the lungs every 6 (six) hours as needed for wheezing., Disp: 2 each, Rfl: 11   aspirin 81 MG chewable tablet, Chew 1 tablet (81 mg total) by mouth daily., Disp: 90 tablet, Rfl: 2   atorvastatin (LIPITOR) 80 MG tablet, Take 1 tablet (80 mg total) by mouth daily., Disp: 90 tablet, Rfl: 3   blood glucose meter kit and supplies KIT, Dispense based on patient and insurance preference. Use up to four times daily as directed. Please include lancets, test strips, control solution., Disp: 1 each, Rfl: 0   cyclobenzaprine (FLEXERIL) 10 MG tablet, Take 1 tablet (10 mg total) by mouth 3 (three) times daily as needed for muscle spasms., Disp: 90 tablet, Rfl: 5   diclofenac Sodium (VOLTAREN) 1 % GEL, Apply 1 Application topically daily as needed (For pain)., Disp: , Rfl:    escitalopram (LEXAPRO) 10 MG tablet, TAKE 1 TABLET BY MOUTH EVERY DAY, Disp: 90 tablet, Rfl: 0   ezetimibe (ZETIA) 10 MG tablet, Take 1 tablet (10 mg total) by mouth daily., Disp: 90 tablet, Rfl: 3   fluticasone furoate-vilanterol (BREO ELLIPTA) 200-25 MCG/ACT AEPB, Inhale 1 puff into the lungs daily., Disp: 1 each, Rfl: 11   loratadine (CLARITIN) 10 MG tablet, Take 1  tablet (10 mg total) by mouth daily., Disp: 90 tablet, Rfl: 3   metFORMIN (GLUCOPHAGE) 500 MG tablet, Take 1 tablet (500 mg total) by mouth 2 (two) times daily with a meal., Disp: 180 tablet, Rfl: 3   metoprolol tartrate (LOPRESSOR) 25 MG tablet, Take 0.5 tablets (12.5 mg total) by mouth 2 (two) times daily., Disp: 90 tablet, Rfl: 3   nitroGLYCERIN (NITROSTAT) 0.4 MG SL tablet, Place 1 tablet (0.4 mg total) under the tongue every 5 (five) minutes as needed., Disp: 25 tablet, Rfl: 2   omeprazole (PRILOSEC) 40 MG capsule, Take 1 capsule (40 mg total) by mouth daily., Disp: 90 capsule, Rfl: 3   oxyCODONE-acetaminophen (PERCOCET) 7.5-325 MG tablet, Take 1 tablet by mouth 3 (three) times daily as needed., Disp: , Rfl:    pregabalin (LYRICA) 75 MG capsule, Take 1 capsule (75 mg total) by mouth 2 (two) times daily., Disp: 180 capsule, Rfl: 1   promethazine (PHENERGAN) 25 MG tablet, Take 1 tablet (25 mg total) by mouth every 6 (six) hours as needed for nausea or vomiting., Disp: 60 tablet, Rfl: 3   Semaglutide,0.25 or 0.'5MG'$ /DOS, (OZEMPIC, 0.25 OR 0.5  MG/DOSE,) 2 MG/3ML SOPN, Inject 0.25 mg into the skin once a week., Disp: 3 mL, Rfl: 0   ticagrelor (BRILINTA) 90 MG TABS tablet, Take 1 tablet (90 mg total) by mouth 2 (two) times daily., Disp: 180 tablet, Rfl: 3   valsartan-hydrochlorothiazide (DIOVAN-HCT) 160-25 MG tablet, Take 1 tablet by mouth daily., Disp: 90 tablet, Rfl: 3   varenicline (CHANTIX) 1 MG tablet, TAKE 1 TABLET BY MOUTH TWICE A DAY, Disp: 180 tablet, Rfl: 1  I spent *** minutes dedicated to the care of this patient on the date of this encounter to include pre-visit review of records, face-to-face time with the patient discussing conditions above, post visit ordering of testing, clinical documentation with the electronic health record, making appropriate referrals as documented, and communicating necessary findings to members of the patients care team.   Garner Nash, DO [facility] 03/29/2022 5:35 PM

## 2022-03-30 ENCOUNTER — Encounter: Payer: Self-pay | Admitting: Pulmonary Disease

## 2022-03-30 ENCOUNTER — Ambulatory Visit (INDEPENDENT_AMBULATORY_CARE_PROVIDER_SITE_OTHER): Payer: 59 | Admitting: Pulmonary Disease

## 2022-03-30 VITALS — BP 122/90 | HR 71 | Ht 67.0 in | Wt 241.8 lb

## 2022-03-30 DIAGNOSIS — R911 Solitary pulmonary nodule: Secondary | ICD-10-CM

## 2022-03-30 DIAGNOSIS — R918 Other nonspecific abnormal finding of lung field: Secondary | ICD-10-CM | POA: Diagnosis not present

## 2022-03-30 DIAGNOSIS — F17210 Nicotine dependence, cigarettes, uncomplicated: Secondary | ICD-10-CM

## 2022-03-30 DIAGNOSIS — Z716 Tobacco abuse counseling: Secondary | ICD-10-CM

## 2022-03-30 MED ORDER — LORATADINE 10 MG PO TABS: 10.0000 mg | ORAL_TABLET | Freq: Every day | ORAL | 4 refills | Status: AC

## 2022-03-30 MED ORDER — VARENICLINE TARTRATE 1 MG PO TABS: 1.0000 mg | ORAL_TABLET | Freq: Two times a day (BID) | ORAL | 1 refills | Status: DC

## 2022-03-30 MED ORDER — VARENICLINE TARTRATE 1 MG PO TABS: 1.0000 mg | ORAL_TABLET | Freq: Two times a day (BID) | ORAL | 2 refills | Status: AC

## 2022-03-30 NOTE — Patient Instructions (Addendum)
Thank you for visiting Dr. Valeta Harms at [facility]. Today we recommend the following: Orders Placed This Encounter  Procedures   NM PET Image Initial (PI) Skull Base To Thigh (F-18 FDG)   Ambulatory Referral for Lung Cancer Scre   Meds ordered this encounter  Medications   varenicline (CHANTIX) 1 MG tablet    Sig: Take 1 tablet (1 mg total) by mouth 2 (two) times daily.    Dispense:  180 tablet    Refill:  1   varenicline (CHANTIX) 1 MG tablet    Sig: Take 1 tablet (1 mg total) by mouth 2 (two) times daily.    Dispense:  60 tablet    Refill:  2   loratadine (CLARITIN) 10 MG tablet    Sig: Take 1 tablet (10 mg total) by mouth daily.    Dispense:  90 tablet    Refill:  4   Please schedule PET prior to next office visit   Return in about 3 weeks (around 04/20/2022) for with APP.    Please do your part to reduce the spread of COVID-19.

## 2022-03-30 NOTE — Progress Notes (Signed)
Smoking Cessation Counseling:   The patient's current tobacco use: 1 pack per week... highest was pack per day, 30+ years  The patient was advised to quit and impact of smoking on their health.  I assessed the patient's willingness to attempt to quit. I provided methods and skills for cessation. We reviewed medication management of smoking session drugs if appropriate. Resources to help quit smoking were provided. A smoking cessation quit date was set: April 1st Follow-up was arranged in our clinic.  The amount of time spent counseling patient was 25mns Discussed tapering method Refilled chantix   BGarner Nash DO  Pulmonary Critical Care 03/30/2022 9:45 AM

## 2022-03-31 ENCOUNTER — Telehealth: Payer: Self-pay | Admitting: Medical-Surgical

## 2022-03-31 NOTE — Telephone Encounter (Signed)
Patient states that his Ozempic is out and insurance is refusing to cover it. Patient wants to know if you have another sample or a recommended alternative. [name]

## 2022-03-31 NOTE — Telephone Encounter (Signed)
Contacted [name] to schedule their annual wellness visit. Appointment made for 04/27/22 at 2 PM.  [name] Patient Access Advocate II Direct Dial: [phone]

## 2022-04-01 ENCOUNTER — Telehealth: Payer: Self-pay | Admitting: Medical-Surgical

## 2022-04-01 ENCOUNTER — Other Ambulatory Visit: Payer: Self-pay

## 2022-04-01 ENCOUNTER — Telehealth: Payer: Self-pay

## 2022-04-01 ENCOUNTER — Telehealth (HOSPITAL_COMMUNITY): Payer: Self-pay

## 2022-04-01 MED ORDER — NITROGLYCERIN 0.4 MG SL SUBL: 0.4000 mg | SUBLINGUAL_TABLET | SUBLINGUAL | 2 refills | Status: DC | PRN

## 2022-04-01 MED ORDER — ESCITALOPRAM OXALATE 10 MG PO TABS: 10.0000 mg | ORAL_TABLET | Freq: Every day | ORAL | 0 refills | Status: DC

## 2022-04-01 NOTE — Telephone Encounter (Signed)
Nitroglycerin sent to [facility]. Patient requesting [facility] be [facility] to [facility] [place], [place] - called [facility] [place], [place] and cancelled script and resent to  [place], [place]

## 2022-04-01 NOTE — Telephone Encounter (Signed)
Patient called he lost his nitro tablets and is [facility] [place]

## 2022-04-01 NOTE — Telephone Encounter (Signed)
Refill sent.

## 2022-04-01 NOTE — Telephone Encounter (Addendum)
Initiated Prior authorization [id number] (0.25 or 0.5 MG/DOSE) '2MG'$ /3ML pen-injectors Via: Covermymeds Case/Key:[id number] Status: approved as of 04/01/22 Reason:Authorization Expiration Date: January 26, 2023. Notified Pt via: Pt does not have Mychart , called pt to notify of approval.

## 2022-04-01 NOTE — Telephone Encounter (Signed)
Medication refill - [facility] requesting a new 90 day order for patient's Escitalopram 10 mg, last provided 01/13/22, pt last seen 02/18/22 and does not return until 06/17/22.

## 2022-04-02 ENCOUNTER — Other Ambulatory Visit: Payer: Self-pay

## 2022-04-02 MED ORDER — OZEMPIC (0.25 OR 0.5 MG/DOSE) 2 MG/3ML ~~LOC~~ SOPN: 0.2500 mg | PEN_INJECTOR | SUBCUTANEOUS | 0 refills | Status: DC

## 2022-04-13 ENCOUNTER — Encounter (HOSPITAL_COMMUNITY): Admission: RE | Admit: 2022-04-13 | Payer: 59 | Source: Ambulatory Visit

## 2022-04-14 ENCOUNTER — Telehealth: Payer: Self-pay | Admitting: Medical-Surgical

## 2022-04-14 NOTE — Telephone Encounter (Signed)
I tried to call patient, voicemail is full.

## 2022-04-14 NOTE — Telephone Encounter (Signed)
Please verify what dose he has been taking.

## 2022-04-14 NOTE — Telephone Encounter (Signed)
Patient called stated he is out of his ozempic needs refills sent to pharmacy

## 2022-04-15 MED ORDER — OZEMPIC (0.25 OR 0.5 MG/DOSE) 2 MG/3ML ~~LOC~~ SOPN: 0.5000 mg | PEN_INJECTOR | SUBCUTANEOUS | 1 refills | Status: DC

## 2022-04-15 NOTE — Telephone Encounter (Signed)
Patient advised.

## 2022-04-15 NOTE — Telephone Encounter (Signed)
He states he is currently taking 0.25 mg. He is ok with an increase.   [facility]

## 2022-04-15 NOTE — Telephone Encounter (Signed)
Ozempic 0.5mg  weekly sent to the pharmacy on file.  ___________________________________________ Clearnce Sorrel, DNP, APRN, FNP-BC [facility] and [place]

## 2022-04-20 ENCOUNTER — Ambulatory Visit: Payer: 59 | Admitting: Primary Care

## 2022-04-23 HISTORY — PX: OTHER SURGICAL HISTORY: SHX169

## 2022-04-27 ENCOUNTER — Ambulatory Visit (INDEPENDENT_AMBULATORY_CARE_PROVIDER_SITE_OTHER): Payer: 59 | Admitting: Medical-Surgical

## 2022-04-27 DIAGNOSIS — Z0000 Encounter for general adult medical examination without abnormal findings: Secondary | ICD-10-CM

## 2022-04-27 NOTE — Progress Notes (Signed)
MEDICARE ANNUAL WELLNESS VISIT  04/27/2022  Telephone Visit Disclaimer This Medicare AWV was conducted by telephone due to national recommendations for restrictions regarding the COVID-19 Pandemic (e.g. social distancing).  I verified, using two identifiers, that I am speaking with [name] or their authorized healthcare agent. I discussed the limitations, risks, security, and privacy concerns of performing an evaluation and management service by telephone and the potential availability of an in-person appointment in the future. The patient expressed understanding and agreed to proceed.  Location of Patient: Home Location of Provider (nurse):  Provider home  Subjective:    [name] is a 52 y.o. male patient of Andrew Bouche, NP who had a Medicare Annual Wellness Visit today via telephone. [name] is Retired and lives alone. he has 4 children. he reports that he is socially active and does interact with friends/family regularly. he is moderately physically active and enjoys fishing and camping.  Patient Care Team: Andrew Bouche, NP as PCP - General (Nurse Practitioner) Andrew Booze, MD as PCP - Cardiology (Cardiology) [name], tauseet MD (Rheumatology)     04/27/2022   12:13 PM 06/17/2020   11:06 AM 11/24/2017   11:14 AM 01/06/2017    4:37 PM 03/06/2016    9:59 AM 10/31/2015    9:41 AM 11/12/2014   12:54 PM  Advanced Directives  Does Patient Have a Medical Advance Directive? No No No No  No No  Would patient like information on creating a medical advance directive? No - Patient declined No - Patient declined No - Patient declined No - Patient declined Yes (MAU/Ambulatory/Procedural Areas - Information given) Yes - Educational materials given     Hospital Utilization Over the Past 12 Months: # of hospitalizations or ER visits: 1 # of surgeries: 1  Review of Systems    Patient reports that his overall health is better compared to last year.  History obtained from chart  review  Patient Reported Readings (BP, Pulse, CBG, Weight, etc) none  Pain Assessment Pain : 0-10 Pain Score: 6  Pain Type: Other (Comment) (surgical) Pain Location: Hand Pain Orientation: Right Pain Descriptors / Indicators: Aching Pain Onset: In the past 7 days Pain Frequency: Intermittent Pain Relieving Factors: pain medication  Pain Relieving Factors: pain medication  Current Medications & Allergies (verified) Allergies as of 04/27/2022       Reactions   Lisinopril Cough   Benadryl [diphenhydramine Hcl] Anxiety        Medication List        Accurate as of April 27, 2022 12:33 PM. If you have any questions, ask your nurse or doctor.          albuterol 108 (90 Base) MCG/ACT inhaler Commonly known as: VENTOLIN HFA Inhale 2 puffs into the lungs every 6 (six) hours as needed for wheezing.   Aspirin Low Dose 81 MG chewable tablet Generic drug: aspirin Chew 1 tablet (81 mg total) by mouth daily.   atorvastatin 80 MG tablet Commonly known as: LIPITOR Take 1 tablet (80 mg total) by mouth daily.   blood glucose meter kit and supplies Kit Dispense based on patient and insurance preference. Use up to four times daily as directed. Please include lancets, test strips, control solution.   cyclobenzaprine 10 MG tablet Commonly known as: FLEXERIL Take 1 tablet (10 mg total) by mouth 3 (three) times daily as needed for muscle spasms.   diclofenac Sodium 1 % Gel Commonly known as: VOLTAREN Apply 1 Application topically daily as  needed (For pain).   escitalopram 10 MG tablet Commonly known as: LEXAPRO Take 1 tablet (10 mg total) by mouth daily.   ezetimibe 10 MG tablet Commonly known as: ZETIA Take 1 tablet (10 mg total) by mouth daily.   fluticasone furoate-vilanterol 200-25 MCG/ACT Aepb Commonly known as: BREO ELLIPTA Inhale 1 puff into the lungs daily.   loratadine 10 MG tablet Commonly known as: CLARITIN Take 1 tablet (10 mg total) by mouth daily.    metFORMIN 500 MG tablet Commonly known as: GLUCOPHAGE Take 1 tablet (500 mg total) by mouth 2 (two) times daily with a meal.   metoprolol tartrate 25 MG tablet Commonly known as: LOPRESSOR Take 0.5 tablets (12.5 mg total) by mouth 2 (two) times daily.   nitroGLYCERIN 0.4 MG SL tablet Commonly known as: Nitrostat Place 1 tablet (0.4 mg total) under the tongue every 5 (five) minutes as needed.   omeprazole 40 MG capsule Commonly known as: PRILOSEC Take 1 capsule (40 mg total) by mouth daily.   oxyCODONE-acetaminophen 7.5-325 MG tablet Commonly known as: PERCOCET Take 1 tablet by mouth 3 (three) times daily as needed.   Ozempic (0.25 or 0.5 MG/DOSE) 2 MG/3ML Sopn Generic drug: Semaglutide(0.25 or 0.5MG /DOS) Inject 0.5 mg into the skin once a week.   pregabalin 75 MG capsule Commonly known as: LYRICA Take 1 capsule (75 mg total) by mouth 2 (two) times daily.   promethazine 25 MG tablet Commonly known as: PHENERGAN Take 1 tablet (25 mg total) by mouth every 6 (six) hours as needed for nausea or vomiting.   ticagrelor 90 MG Tabs tablet Commonly known as: BRILINTA Take 1 tablet (90 mg total) by mouth 2 (two) times daily.   valsartan-hydrochlorothiazide 160-25 MG tablet Commonly known as: DIOVAN-HCT Take 1 tablet by mouth daily.   varenicline 1 MG tablet Commonly known as: CHANTIX Take 1 tablet (1 mg total) by mouth 2 (two) times daily.   varenicline 1 MG tablet Commonly known as: CHANTIX Take 1 tablet (1 mg total) by mouth 2 (two) times daily.        History (reviewed): Past Medical History:  Diagnosis Date   Acute ST elevation myocardial infarction (STEMI) due to occlusion of left anterior descending (LAD) coronary artery 08/08/2021   mLAD 100% -> DES PCI Synergy DES 2.75 mm x 16 (3.25 mm); => mRCA90% -> staged PCI.    Anxiety    Arthritis    Asthma    history [id number]   Coronary artery disease involving native coronary artery of native heart with unstable  angina pectoris 08/08/2021   Ant STEMI - Culprit 100% mLAD (DES PCI); mRCA 90% (Staged PCI 7/17), OM1 50%.   Depression    Diabetes mellitus 11/19/2021   Essential hypertension 12/11/2011   Gout    Headache    History of hiatal hernia    History of lithotripsy    Hypertension    Kidney stones    Lumbar degenerative disc disease 12/11/2011   Moderate obstructive sleep apnea 04/21/2021   Nephrolithiasis 12/11/2011   Pericarditis    Renal tubular acidosis    Rheumatoid arthritis(714.0)    Past Surgical History:  Procedure Laterality Date   ANTERIOR CERVICAL DECOMP/DISCECTOMY FUSION     ANTERIOR CERVICAL DECOMP/DISCECTOMY FUSION N/A 11/25/2017   Procedure: Anterior Cervical Discectomy Fusion - Cervical Six-Cervical Seven, removal Cervical Five-Six plate;  Surgeon: Eustace Moore, MD;  Location: [facility];  Service: Neurosurgery;  Laterality: N/A;  Anterior Cervical Discectomy Fusion - Cervical Six-Cervical Seven, removal  Cervical Five-Six plate   CARPAL TUNNEL RELEASE Left 11/06/2015   Procedure: LEFT WRIST CARPAL TUNNEL RELEASE;  Surgeon: Iran Planas, MD;  Location: [facility];  Service: Orthopedics;  Laterality: Left;   carpel tunnel surgery Right 04/23/2022   CORONARY STENT INTERVENTION N/A 08/11/2021   Procedure: CORONARY STENT INTERVENTION;  Surgeon: Andrew Booze, MD;  Location: [facility];  Service: Cardiovascular;  Laterality: N/A;   CORONARY/GRAFT ACUTE MI REVASCULARIZATION N/A 08/08/2021   Procedure: Coronary/Graft Acute MI Revascularization;  Surgeon: Andrew Booze, MD;  Location: [facility];  Service: Cardiovascular;  Laterality: N/A;   HEMORROIDECTOMY     INTRAVASCULAR ULTRASOUND/IVUS N/A 08/08/2021   Procedure: Intravascular Ultrasound/IVUS;  Surgeon: Andrew Booze, MD;  Location: [facility];  Service: Cardiovascular;  Laterality: N/A;   KIDNEY STONE SURGERY     LEFT HEART CATH AND CORONARY ANGIOGRAPHY N/A 08/08/2021   Procedure: LEFT  HEART CATH AND CORONARY ANGIOGRAPHY;  Surgeon: Andrew Booze, MD;  Location: [facility];  Service: Cardiovascular;  Laterality: N/A;   LEFT HEART CATH AND CORONARY ANGIOGRAPHY N/A 08/11/2021   Procedure: LEFT HEART CATH AND CORONARY ANGIOGRAPHY;  Surgeon: Andrew Booze, MD;  Location: [facility];  Service: Cardiovascular;  Laterality: N/A;   WRIST FUSION Left 11/06/2015   Procedure: ARTHRODESIS LEFT WRIST;  Surgeon: Iran Planas, MD;  Location: [facility];  Service: Orthopedics;  Laterality: Left;   Family History  Problem Relation Age of Onset   Hypertension Father    Alcohol abuse Father    Heart attack Father 75   Diabetes Mellitus II Father    Coronary artery disease Father    Diabetes Mellitus II Mother        Martin Majestic into a diabetic coma, per pt.   Cancer - Colon Mother    Coronary artery disease Mother    Heart attack Mother 73   Ovarian cancer Mother    Uterine cancer Mother    Heart attack Brother    Coronary artery disease Brother    Heart murmur Sister    Dementia Neg Hx    Bipolar disorder Neg Hx    Depression Neg Hx    Drug abuse Neg Hx    OCD Neg Hx    Social History   Socioeconomic History   Marital status: Legally Separated    Spouse name: Manuela Schwartz   Number of children: 4   Years of education: 9th grade   Highest education level: 9th grade  Occupational History    Comment: Legally disabled  Tobacco Use   Smoking status: Every Day    Packs/day: 1.00    Years: 30.00    Additional pack years: 0.00    Total pack years: 30.00    Types: Cigarettes   Smokeless tobacco: Former    Types: Chew    Quit date: 1988   Tobacco comments:    Currently on Chantix; 2-3 cigarettes a day.   Vaping Use   Vaping Use: Never used  Substance and Sexual Activity   Alcohol use: No   Drug use: No   Sexual activity: Yes    Partners: Female  Other Topics Concern   Not on file  Social History Narrative   Lives with his family. He walks daily, four times  a day with his dog. He enjoys fishing and camping.   Social Determinants of Health   Financial Resource Strain: Low Risk  (04/27/2022)   Overall Financial Resource Strain (CARDIA)  Difficulty of Paying Living Expenses: Not hard at all  Food Insecurity: No Food Insecurity (04/27/2022)   Hunger Vital Sign    Worried About Running Out of Food in the Last Year: Never true    Ran Out of Food in the Last Year: Never true  Transportation Needs: No Transportation Needs (04/27/2022)   PRAPARE - Hydrologist (Medical): No    Lack of Transportation (Non-Medical): No  Physical Activity: Sufficiently Active (04/27/2022)   Exercise Vital Sign    Days of Exercise per Week: 6 days    Minutes of Exercise per Session: 40 min  Stress: No Stress Concern Present (04/27/2022)   [place]    Feeling of Stress : Not at all  Social Connections: Socially Isolated (04/27/2022)   Social Connection and Isolation Panel [NHANES]    Frequency of Communication with Friends and Family: More than three times a week    Frequency of Social Gatherings with Friends and Family: More than three times a week    Attends Religious Services: Never    Marine scientist or Organizations: No    Attends Archivist Meetings: Never    Marital Status: Separated    Activities of Daily Living    04/27/2022   12:20 PM  In your present state of health, do you have any difficulty performing the following activities:  Hearing? 0  Comment tinnitus  Vision? 0  Difficulty concentrating or making decisions? 0  Walking or climbing stairs? 0  Dressing or bathing? 0  Doing errands, shopping? 0  Preparing Food and eating ? N  Using the Toilet? N  In the past six months, have you accidently leaked urine? N  Do you have problems with loss of bowel control? N  Managing your Medications? N  Managing your Finances? N  Housekeeping or  managing your Housekeeping? N    Patient Education/ Literacy How often do you need to have someone help you when you read instructions, pamphlets, or other written materials from your doctor or pharmacy?: 1 - Never What is the last grade level you completed in school?: 9th grade  Exercise Current Exercise Habits: Home exercise routine, Type of exercise: walking, Time (Minutes): 40, Frequency (Times/Week): 6, Weekly Exercise (Minutes/Week): 240, Intensity: Moderate, Exercise limited by: None identified  Diet Patient reports consuming 3 meals a day and 2 snack(s) a day Patient reports that his primary diet is: Regular Patient reports that she does have regular access to food.   Depression Screen    04/27/2022   12:14 PM 02/19/2022   10:49 AM 08/19/2021   11:20 AM 04/03/2021    8:55 AM 01/15/2021    9:43 AM 06/17/2020   11:07 AM 04/10/2020    2:05 PM  PHQ 2/9 Scores  PHQ - 2 Score 0 0 0 0 0 0   PHQ- 9 Score  1   0       Information is confidential and restricted. Go to Review Flowsheets to unlock data.     Fall Risk    04/27/2022   12:14 PM 02/19/2022   10:49 AM 04/03/2021    8:55 AM 06/17/2020   11:06 AM 03/06/2016    9:51 AM  Fall Risk   Falls in the past year? 0 0 0 0 No  Number falls in past yr: 0 0 0 0   Injury with Fall? 0 0 0 0   Risk for fall  due to : No Fall Risks No Fall Risks No Fall Risks No Fall Risks   Follow up Falls evaluation completed Falls evaluation completed Falls evaluation completed Falls evaluation completed      Objective:  [name] seemed alert and oriented and he participated appropriately during our telephone visit.  Blood Pressure Weight BMI  BP Readings from Last 3 Encounters:  03/30/22 (!) 122/90  03/26/22 (!) 140/86  02/19/22 (!) 164/83   Wt Readings from Last 3 Encounters:  03/30/22 241 lb 12.8 oz (109.7 kg)  03/26/22 243 lb 2 oz (110.3 kg)  02/27/22 247 lb (112 kg)   BMI Readings from Last 1 Encounters:  03/30/22 37.87 kg/m     *Unable to obtain current vital signs, weight, and BMI due to telephone visit type  Hearing/Vision  [name] did not seem to have difficulty with hearing/understanding during the telephone conversation Reports that he has not had a formal eye exam by an eye care professional within the past year Reports that he has not had a formal hearing evaluation within the past year *Unable to fully assess hearing and vision during telephone visit type  Cognitive Function:    04/27/2022   12:24 PM 06/17/2020   11:14 AM 03/06/2016    9:59 AM  6CIT Screen  What Year? 0 points 0 points 0 points  What month? 0 points 0 points 0 points  What time? 0 points 0 points 0 points  Count back from 20 0 points 0 points 0 points  Months in reverse 4 points 0 points 0 points  Repeat phrase 4 points 0 points 0 points  Total Score 8 points 0 points 0 points   (Normal:0-7, Significant for Dysfunction: >8)  Normal Cognitive Function Screening: No: Will discuss results with PCP.    Immunization & Health Maintenance Record Immunization History  Administered Date(s) Administered   Influenza, Seasonal, Injecte, Preservative Fre 02/12/2012   Influenza,inj,Quad PF,6+ Mos 10/02/2014, 09/25/2015, 12/30/2016, 01/27/2018, 01/15/2021   Moderna Sars-Covid-2 Vaccination 09/11/2019, 10/09/2019   PPD Test 05/14/2014   Tdap 11/12/2014   Zoster Recombinat (Shingrix) 06/16/2020, 04/03/2021    Health Maintenance  Topic Date Due   OPHTHALMOLOGY EXAM  04/27/2022 (Originally 05/01/1980)   COVID-19 Vaccine (3 - Moderna risk series) 05/13/2022 (Originally 11/06/2019)   COLONOSCOPY (Pts 45-75yrs Insurance coverage will need to be confirmed)  04/27/2023 (Originally 05/01/2014)   Hepatitis C Screening  04/27/2023 (Originally 05/01/1987)   HIV Screening  04/27/2023 (Originally 04/30/1984)   HEMOGLOBIN A1C  08/20/2022   INFLUENZA VACCINE  08/27/2022   Diabetic kidney evaluation - eGFR measurement  11/05/2022   Diabetic kidney evaluation -  Urine ACR  11/20/2022   FOOT EXAM  11/20/2022   Lung Cancer Screening  02/28/2023   Medicare Annual Wellness (AWV)  04/27/2023   DTaP/Tdap/Td (2 - Td or Tdap) 11/11/2024   Zoster Vaccines- Shingrix  Completed   HPV VACCINES  Aged Out       Assessment  This is a routine wellness examination for [employer].  Health Maintenance: Due or Overdue There are no preventive care reminders to display for this patient.   [name] does not need a referral for Community Assistance: Care Management:   no Social Work:    no Prescription Assistance:  no Nutrition/Diabetes Education:  no   Plan:  Personalized Goals  Goals Addressed               This Visit's Progress     Patient Stated (pt-stated)  Patient stated that he has been working on his diet and he would like to continue to work on that.       Personalized Health Maintenance & Screening Recommendations  Colorectal cancer screening  Eye exam - will call and schedule.  Lung Cancer Screening Recommended: yes; had one on 02/27/22 (Low Dose CT Chest recommended if Age 34-80 years, 30 pack-year currently smoking OR have quit w/in past 15 years) Hepatitis C Screening recommended: yes HIV Screening recommended: yes  Advanced Directives: Written information was not prepared per patient's request.  Referrals & Orders No orders of the defined types were placed in this encounter.   Follow-up Plan Follow-up with Andrew Bouche, NP as planned Discuss 6 CIT with PCP.  Schedule colonoscopy and eye exam Medicare wellness visit in one year.  AVS printed and mailed to the patient.   I have personally reviewed and noted the following in the patient's chart:   Medical and social history Use of alcohol, tobacco or illicit drugs  Current medications and supplements Functional ability and status Nutritional status Physical activity Advanced directives List of other physicians Hospitalizations, surgeries, and ER visits  in previous 12 months Vitals Screenings to include cognitive, depression, and falls Referrals and appointments  In addition, I have reviewed and discussed with [name] certain preventive protocols, quality metrics, and best practice recommendations. A written personalized care plan for preventive services as well as general preventive health recommendations is available and can be mailed to the patient at his request.      Tinnie Gens, RN BSN   04/27/2022

## 2022-04-27 NOTE — Patient Instructions (Addendum)
[facility] Maintenance Summary and Written Plan of Care  [name] ,  Thank you for allowing me to perform your Medicare Annual Wellness Visit and for your ongoing commitment to your health.   Health Maintenance & Immunization History Health Maintenance  Topic Date Due   OPHTHALMOLOGY EXAM  04/27/2022 (Originally [birth year]01/1979)   COVID-19 Vaccine (3 - Moderna risk series) 05/13/2022 (Originally 11/06/2019)   COLONOSCOPY (Pts 45-38yrs Insurance coverage will need to be confirmed)  04/27/2023 (Originally 05/01/2014)   Hepatitis C Screening  04/27/2023 (Originally 05/01/1987)   HIV Screening  04/27/2023 (Originally 04/30/1984)   HEMOGLOBIN A1C  08/20/2022   INFLUENZA VACCINE  08/27/2022   Diabetic kidney evaluation - eGFR measurement  11/05/2022   Diabetic kidney evaluation - Urine ACR  11/20/2022   FOOT EXAM  11/20/2022   Lung Cancer Screening  02/28/2023   Medicare Annual Wellness (AWV)  04/27/2023   DTaP/Tdap/Td (2 - Td or Tdap) 11/11/2024   Zoster Vaccines- Shingrix  Completed   HPV VACCINES  Aged Out   Immunization History  Administered Date(s) Administered   Influenza, Seasonal, Injecte, Preservative Fre 02/12/2012   Influenza,inj,Quad PF,6+ Mos 10/02/2014, 09/25/2015, 12/30/2016, 01/27/2018, 01/15/2021   Moderna Sars-Covid-2 Vaccination 09/11/2019, 10/09/2019   PPD Test 05/14/2014   Tdap 11/12/2014   Zoster Recombinat (Shingrix) 06/16/2020, 04/03/2021    These are the patient goals that we discussed:  Goals Addressed               This Visit's Progress     Patient Stated (pt-stated)        Patient stated that he has been working on his diet and he would like to continue to work on that.         This is a list of Health Maintenance Items that are overdue or due now: Colorectal cancer screening  Eye exam - will call and schedule.  Orders/Referrals Placed Today: No orders of the defined types were placed in this encounter.  (Contact our  referral department at [phone] if you have not spoken with someone about your referral appointment within the next 5 days)    Follow-up Plan Follow-up with Samuel Bouche, NP as planned Discuss 6 CIT with PCP.  Schedule colonoscopy and eye exam Medicare wellness visit in one year.  AVS printed and mailed to the patient.      Health Maintenance, Male Adopting a healthy lifestyle and getting preventive care are important in promoting health and wellness. Ask your health care provider about: The right schedule for you to have regular tests and exams. Things you can do on your own to prevent diseases and keep yourself healthy. What should I know about diet, weight, and exercise? Eat a healthy diet  Eat a diet that includes plenty of vegetables, fruits, low-fat dairy products, and lean protein. Do not eat a lot of foods that are high in solid fats, added sugars, or sodium. Maintain a healthy weight Body mass index (BMI) is a measurement that can be used to identify possible weight problems. It estimates body fat based on height and weight. Your health care provider can help determine your BMI and help you achieve or maintain a healthy weight. Get regular exercise Get regular exercise. This is one of the most important things you can do for your health. Most adults should: Exercise for at least 150 minutes each week. The exercise should increase your heart rate and make you sweat (moderate-intensity exercise). Do strengthening exercises at least twice a  week. This is in addition to the moderate-intensity exercise. Spend less time sitting. Even light physical activity can be beneficial. Watch cholesterol and blood lipids Have your blood tested for lipids and cholesterol at 53 years of age, then have this test every 5 years. You may need to have your cholesterol levels checked more often if: Your lipid or cholesterol levels are high. You are older than 53 years of age. You are at high  risk for heart disease. What should I know about cancer screening? Many types of cancers can be detected early and may often be prevented. Depending on your health history and family history, you may need to have cancer screening at various ages. This may include screening for: Colorectal cancer. Prostate cancer. Skin cancer. Lung cancer. What should I know about heart disease, diabetes, and high blood pressure? Blood pressure and heart disease High blood pressure causes heart disease and increases the risk of stroke. This is more likely to develop in people who have high blood pressure readings or are overweight. Talk with your health care provider about your target blood pressure readings. Have your blood pressure checked: Every 3-5 years if you are 81-69 years of age. Every year if you are 72 years old or older. If you are between the ages of 73 and 74 and are a current or former smoker, ask your health care provider if you should have a one-time screening for abdominal aortic aneurysm (AAA). Diabetes Have regular diabetes screenings. This checks your fasting blood sugar level. Have the screening done: Once every three years after age 46 if you are at a normal weight and have a low risk for diabetes. More often and at a younger age if you are overweight or have a high risk for diabetes. What should I know about preventing infection? Hepatitis B If you have a higher risk for hepatitis B, you should be screened for this virus. Talk with your health care provider to find out if you are at risk for hepatitis B infection. Hepatitis C Blood testing is recommended for: Everyone born from [birth year] through 1966. Anyone with known risk factors for hepatitis C. Sexually transmitted infections (STIs) You should be screened each year for STIs, including gonorrhea and chlamydia, if: You are sexually active and are younger than 53 years of age. You are older than 53 years of age and your health care  provider tells you that you are at risk for this type of infection. Your sexual activity has changed since you were last screened, and you are at increased risk for chlamydia or gonorrhea. Ask your health care provider if you are at risk. Ask your health care provider about whether you are at high risk for HIV. Your health care provider may recommend a prescription medicine to help prevent HIV infection. If you choose to take medicine to prevent HIV, you should first get tested for HIV. You should then be tested every 3 months for as long as you are taking the medicine. Follow these instructions at home: Alcohol use Do not drink alcohol if your health care provider tells you not to drink. If you drink alcohol: Limit how much you have to 0-2 drinks a day. Know how much alcohol is in your drink. In the [place] one drink equals one 12 oz bottle of beer (355 mL), one 5 oz glass of wine (148 mL), or one 1 oz glass of hard liquor (44 mL). Lifestyle Do not use any products that contain nicotine or tobacco.  These products include cigarettes, chewing tobacco, and vaping devices, such as e-cigarettes. If you need help quitting, ask your health care provider. Do not use street drugs. Do not share needles. Ask your health care provider for help if you need support or information about quitting drugs. General instructions Schedule regular health, dental, and eye exams. Stay current with your vaccines. Tell your health care provider if: You often feel depressed. You have ever been abused or do not feel safe at home. Summary Adopting a healthy lifestyle and getting preventive care are important in promoting health and wellness. Follow your health care provider's instructions about healthy diet, exercising, and getting tested or screened for diseases. Follow your health care provider's instructions on monitoring your cholesterol and blood pressure. This information is not intended to replace advice given to  you by your health care provider. Make sure you discuss any questions you have with your health care provider. Document Revised: 06/03/2020 Document Reviewed: 06/03/2020 Elsevier Patient Education  [place].

## 2022-05-01 ENCOUNTER — Encounter (HOSPITAL_COMMUNITY)
Admission: RE | Admit: 2022-05-01 | Discharge: 2022-05-01 | Disposition: A | Discharge status: Home / Self Care | Payer: 59 | Source: Ambulatory Visit | Attending: Pulmonary Disease | Admitting: Pulmonary Disease

## 2022-05-01 DIAGNOSIS — R911 Solitary pulmonary nodule: Secondary | ICD-10-CM | POA: Diagnosis present

## 2022-05-01 LAB — GLUCOSE, CAPILLARY: Glucose-Capillary: 109 mg/dL — ABNORMAL HIGH (ref 70–99)

## 2022-05-01 MED ORDER — FLUDEOXYGLUCOSE F - 18 (FDG) INJECTION
10.5000 | Freq: Once | INTRAVENOUS | Status: AC | PRN
  Administered 2022-05-01: 11.95 via INTRAVENOUS

## 2022-05-04 ENCOUNTER — Encounter: Payer: Self-pay | Admitting: Primary Care

## 2022-05-04 ENCOUNTER — Telehealth: Payer: Self-pay | Admitting: Primary Care

## 2022-05-04 ENCOUNTER — Ambulatory Visit (INDEPENDENT_AMBULATORY_CARE_PROVIDER_SITE_OTHER): Payer: 59 | Admitting: Primary Care

## 2022-05-04 VITALS — BP 138/86 | HR 68 | Ht 67.0 in | Wt 239.8 lb

## 2022-05-04 DIAGNOSIS — Z720 Tobacco use: Secondary | ICD-10-CM | POA: Diagnosis not present

## 2022-05-04 DIAGNOSIS — R911 Solitary pulmonary nodule: Secondary | ICD-10-CM | POA: Diagnosis not present

## 2022-05-04 DIAGNOSIS — J320 Chronic maxillary sinusitis: Secondary | ICD-10-CM

## 2022-05-04 DIAGNOSIS — F17210 Nicotine dependence, cigarettes, uncomplicated: Secondary | ICD-10-CM

## 2022-05-04 DIAGNOSIS — J329 Chronic sinusitis, unspecified: Secondary | ICD-10-CM | POA: Insufficient documentation

## 2022-05-04 DIAGNOSIS — Z87891 Personal history of nicotine dependence: Secondary | ICD-10-CM

## 2022-05-04 DIAGNOSIS — J439 Emphysema, unspecified: Secondary | ICD-10-CM | POA: Diagnosis not present

## 2022-05-04 DIAGNOSIS — I10 Essential (primary) hypertension: Secondary | ICD-10-CM

## 2022-05-04 DIAGNOSIS — Z122 Encounter for screening for malignant neoplasm of respiratory organs: Secondary | ICD-10-CM

## 2022-05-04 NOTE — Telephone Encounter (Signed)
Order placed for repeat lung screening CT for 02/2023. Pt will be called closer to this time to schedule.

## 2022-05-04 NOTE — Assessment & Plan Note (Addendum)
-   BP 138/86 - Continue Diovan-HCTZ 160-25mg  daily  - Follow up with PCP

## 2022-05-04 NOTE — Assessment & Plan Note (Signed)
-   Patient has 1.4cm RUL pulmonary nodule which is non-hypermetabolic on PET imaging 05/01/22 - Continue annual screening with LDCT/lung cancer screening program - Smoking cessation continues to be strongly encouraged

## 2022-05-04 NOTE — Assessment & Plan Note (Signed)
-   Continue BREO and prn Albuterol

## 2022-05-04 NOTE — Patient Instructions (Addendum)
PET scan showed right upper lobe pulmonary nodule is non-hypermetabolic, compatible with benign scarring.  No evidence of metastatic disease.  Follow-up with primary care regarding mild hepatic steatosis and coronary artery disease  We will get you back established with lung cancer screening to continue low-dose CT scans of your chest, next needed in 6-12 months   Recommendations:  Continue to work on smoking cessation, taper amount and reconsider new quit date Continue Chantix as directed Continue Breo one puff daily in the morning  Continue Albuterol 2 puffs every 4-6 hours as needed for breakthrough shortness of breath  Follow-up: 6 months with Dr. Tonia Brooms or sooner

## 2022-05-04 NOTE — Assessment & Plan Note (Signed)
-   Current smoker; a pack of cigarettes lasts 2-3 weeks.  - Continue Chantix - Advised patient continue to taper amount he is smoking and pick new quit date

## 2022-05-04 NOTE — Telephone Encounter (Signed)
PET scan was negative, needing to re-establish with lung cancer screening program. Needs repeat LDCT chest in 6-12 months, would recommend staying on regular schedule and getting LDCT in February 2025.

## 2022-05-04 NOTE — Assessment & Plan Note (Addendum)
-   Daily PND symptoms, worse in the morning. No tenderness/pain or purulent drainage.  - Continue loratadine 10mg  daily - If patient developed ongoing/acute symptoms would refer to ENT

## 2022-05-04 NOTE — Progress Notes (Signed)
  ID: [name], male    DOB: 01/09/1971, 52 y.o.   MRN: [id number]  Chief Complaint  Patient presents with   Follow-up    Referring provider: Christen Butter, NP  HPI: 52 year old male, current everyday smoker.  Patient of Dr. Tonia Brooms, seen for initial consult on 03/30/2022 due to right upper lobe pulmonary nodule.  Previous [facility] pulmonary encounter:  This is a 52 year old gentleman, past medical history of STEMI in July 2023, currently on Brilinta.  Has a history of hypertension and rheumatoid arthritis.  Patient underwent left heart catheterization and stent placement.  Current every day smoker for the past 30+ years.Patient had a lung cancer screening CT on 02/27/2022 which revealed a right apical linear density.  Malignancy could not be excluded.  Patient was referred for evaluation.  He does not have any other symptoms.  This was found incidentally on screening CT.  He is working diligently to quit smoking he is down to 1 pack for an entire week and has been using Chantix daily.  05/04/2022 Patient presents today for a 1 month follow-up following PET scan. Patient was given Chantix and quit date was set for April 1.  Breathing wise he is doing well. He uses BREO 1 puff daily/ takes in the evening. He uses Albuterol rescue inhaler mainly when exercising no more than 2 times a day. He walks a mile 1-2 times a day. He is still smoking, cut back significantly. 1 pack of cigarettes will last him 3 weeks. On average he smokes no more than 2-3 cigarettes a day. He is taking chantix. He has a chronic no cough and post nasal drip symptoms in morning only. Denies sinus pain/tenderness or purulence drainage. He is taking Claritin daily.   We reviewed PET scan from 05/01/22 which showed right upper lobe pulmonary nodule was non-hypermetabolic and compatible with benign scarring.  He had no other hypermetabolic thoracic adenopathy or distant metastatic disease.  Allergies  Allergen Reactions    Lisinopril Cough   Benadryl [Diphenhydramine Hcl] Anxiety    Immunization History  Administered Date(s) Administered   Influenza, Seasonal, Injecte, Preservative Fre 02/12/2012   Influenza,inj,Quad PF,6+ Mos 10/02/2014, 09/25/2015, 12/30/2016, 01/27/2018, 01/15/2021   Moderna Sars-Covid-2 Vaccination 09/11/2019, 10/09/2019   PPD Test 05/14/2014   Tdap 11/12/2014   Zoster Recombinat (Shingrix) 06/16/2020, 04/03/2021    Past Medical History:  Diagnosis Date   Acute ST elevation myocardial infarction (STEMI) due to occlusion of left anterior descending (LAD) coronary artery 08/08/2021   mLAD 100% -> DES PCI Synergy DES 2.75 mm x 16 (3.25 mm); => mRCA90% -> staged PCI.    Anxiety    Arthritis    Asthma    history [id number]   Coronary artery disease involving native coronary artery of native heart with unstable angina pectoris 08/08/2021   Ant STEMI - Culprit 100% mLAD (DES PCI); mRCA 90% (Staged PCI 7/17), OM1 50%.   Depression    Diabetes mellitus 11/19/2021   Essential hypertension 12/11/2011   Gout    Headache    History of hiatal hernia    History of lithotripsy    Hypertension    Kidney stones    Lumbar degenerative disc disease 12/11/2011   Moderate obstructive sleep apnea 04/21/2021   Nephrolithiasis 12/11/2011   Pericarditis    Renal tubular acidosis    Rheumatoid arthritis(714.0)     Tobacco History: Social History   Tobacco Use  Smoking Status Every Day   Packs/day: 1.00   Years: 30.00  Additional pack years: 0.00   Total pack years: 30.00   Types: Cigarettes  Smokeless Tobacco Former   Types: Chew   Quit date: 1988  Tobacco Comments   Currently on Chantix; 2-3 cigarettes a day.    Ready to quit: Not Answered Counseling given: Not Answered Tobacco comments: Currently on Chantix; 2-3 cigarettes a day.    Outpatient Medications Prior to Visit  Medication Sig Dispense Refill   albuterol (VENTOLIN HFA) 108 (90 Base) MCG/ACT inhaler Inhale 2 puffs  into the lungs every 6 (six) hours as needed for wheezing. 2 each 11   aspirin 81 MG chewable tablet Chew 1 tablet (81 mg total) by mouth daily. 90 tablet 2   atorvastatin (LIPITOR) 80 MG tablet Take 1 tablet (80 mg total) by mouth daily. 90 tablet 3   blood glucose meter kit and supplies KIT Dispense based on patient and insurance preference. Use up to four times daily as directed. Please include lancets, test strips, control solution. 1 each 0   cyclobenzaprine (FLEXERIL) 10 MG tablet Take 1 tablet (10 mg total) by mouth 3 (three) times daily as needed for muscle spasms. 90 tablet 5   diclofenac Sodium (VOLTAREN) 1 % GEL Apply 1 Application topically daily as needed (For pain).     escitalopram (LEXAPRO) 10 MG tablet Take 1 tablet (10 mg total) by mouth daily. 90 tablet 0   ezetimibe (ZETIA) 10 MG tablet Take 1 tablet (10 mg total) by mouth daily. 90 tablet 3   fluticasone furoate-vilanterol (BREO ELLIPTA) 200-25 MCG/ACT AEPB Inhale 1 puff into the lungs daily. 1 each 11   loratadine (CLARITIN) 10 MG tablet Take 1 tablet (10 mg total) by mouth daily. 90 tablet 4   metFORMIN (GLUCOPHAGE) 500 MG tablet Take 1 tablet (500 mg total) by mouth 2 (two) times daily with a meal. 180 tablet 3   metoprolol tartrate (LOPRESSOR) 25 MG tablet Take 0.5 tablets (12.5 mg total) by mouth 2 (two) times daily. 90 tablet 3   nitroGLYCERIN (NITROSTAT) 0.4 MG SL tablet Place 1 tablet (0.4 mg total) under the tongue every 5 (five) minutes as needed. 25 tablet 2   omeprazole (PRILOSEC) 40 MG capsule Take 1 capsule (40 mg total) by mouth daily. 90 capsule 3   oxyCODONE-acetaminophen (PERCOCET) 7.5-325 MG tablet Take 1 tablet by mouth 3 (three) times daily as needed.     pregabalin (LYRICA) 75 MG capsule Take 1 capsule (75 mg total) by mouth 2 (two) times daily. 180 capsule 1   promethazine (PHENERGAN) 25 MG tablet Take 1 tablet (25 mg total) by mouth every 6 (six) hours as needed for nausea or vomiting. 60 tablet 3    Semaglutide,0.25 or 0.5MG /DOS, (OZEMPIC, 0.25 OR 0.5 MG/DOSE,) 2 MG/3ML SOPN Inject 0.5 mg into the skin once a week. 3 mL 1   ticagrelor (BRILINTA) 90 MG TABS tablet Take 1 tablet (90 mg total) by mouth 2 (two) times daily. 180 tablet 3   valsartan-hydrochlorothiazide (DIOVAN-HCT) 160-25 MG tablet Take 1 tablet by mouth daily. 90 tablet 3   varenicline (CHANTIX) 1 MG tablet Take 1 tablet (1 mg total) by mouth 2 (two) times daily. 180 tablet 1   varenicline (CHANTIX) 1 MG tablet Take 1 tablet (1 mg total) by mouth 2 (two) times daily. 60 tablet 2   No facility-administered medications prior to visit.   Review of Systems  Review of Systems  Constitutional: Negative.   HENT:  Positive for postnasal drip. Negative for sinus pressure.  Respiratory:  Positive for cough. Negative for shortness of breath and wheezing.    Physical Exam  BP 138/86 (BP Location: Left Arm, Patient Position: Sitting, Cuff Size: Large)   Pulse 68   Ht 5\' 7"  (1.702 m)   Wt 239 lb 12.8 oz (108.8 kg)   SpO2 97%   BMI 37.56 kg/m  Physical Exam Constitutional:      General: He is not in acute distress.    Appearance: Normal appearance. He is not ill-appearing or toxic-appearing.  HENT:     Head: Normocephalic and atraumatic.     Mouth/Throat:     Mouth: Mucous membranes are moist.     Pharynx: Oropharynx is clear.  Cardiovascular:     Rate and Rhythm: Normal rate and regular rhythm.  Pulmonary:     Effort: Pulmonary effort is normal.     Breath sounds: Normal breath sounds. No wheezing, rhonchi or rales.  Musculoskeletal:        General: Normal range of motion.  Skin:    General: Skin is warm and dry.  Neurological:     General: No focal deficit present.     Mental Status: He is alert and oriented to person, place, and time. Mental status is at baseline.  Psychiatric:        Mood and Affect: Mood normal.        Behavior: Behavior normal.        Thought Content: Thought content normal.        Judgment:  Judgment normal.      Lab Results:  CBC    Component Value Date/Time   WBC 11.0 (H) 08/09/2021 0634   RBC 4.99 08/09/2021 0634   HGB 15.1 08/09/2021 0634   HCT 43.2 08/09/2021 0634   PLT 332 08/09/2021 0634   MCV 86.6 08/09/2021 0634   MCH 30.3 08/09/2021 0634   MCHC 35.0 08/09/2021 0634   RDW 13.4 08/09/2021 0634   LYMPHSABS 2.8 11/24/2017 1117   MONOABS 0.8 11/24/2017 1117   EOSABS 0.2 11/24/2017 1117   BASOSABS 0.0 11/24/2017 1117    BMET    Component Value Date/Time   NA 135 11/04/2021 1035   K 5.0 11/04/2021 1035   CL 98 11/04/2021 1035   CO2 26 11/04/2021 1035   GLUCOSE 173 (H) 11/04/2021 1035   GLUCOSE 244 (H) 08/09/2021 0634   BUN 10 11/04/2021 1035   CREATININE 0.96 11/04/2021 1035   CREATININE 1.03 04/03/2021 0000   CALCIUM 10.3 (H) 11/04/2021 1035   CALCIUM 10.6 02/09/2012 0000   GFRNONAA >60 08/09/2021 0634   GFRNONAA 103 07/12/2019 1136   GFRAA 120 07/12/2019 1136    BNP No results found for: "BNP"  ProBNP No results found for: "PROBNP"  Imaging: NM PET Image Initial (PI) Skull Base To Thigh (F-18 FDG)  Result Date: 05/04/2022 CLINICAL DATA:  Initial treatment strategy for apical right upper lobe nodule. EXAM: NUCLEAR MEDICINE PET SKULL BASE TO THIGH TECHNIQUE: 12.0 mCi F-18 FDG was injected intravenously. Full-ring PET imaging was performed from the skull base to thigh after the radiotracer. CT data was obtained and used for attenuation correction and anatomic localization. Fasting blood glucose: 109 mg/dl COMPARISON:  40/35/2481 screening chest CT. FINDINGS: Mediastinal blood pool activity: SUV max 3.3 Liver activity: SUV max NA NECK: No hypermetabolic lymph nodes in the neck. Incidental CT findings: Chronic right maxillary sinusitis with mucosal thickening and asymmetric right maxillary sinus wall bony thickening. CHEST: Posterior apical right upper lobe 1.4 cm solid bandlike  nodular opacity is non hypermetabolic with max SUV 1.7 (series 7/image 14).  No hypermetabolic pulmonary findings. No enlarged or hypermetabolic axillary, mediastinal or hilar lymph nodes. Incidental CT findings: Coronary atherosclerosis. ABDOMEN/PELVIS: No abnormal hypermetabolic activity within the liver, pancreas, adrenal glands, or spleen. No hypermetabolic lymph nodes in the abdomen or pelvis. Incidental CT findings: Mild diffuse hepatic steatosis. SKELETON: No focal hypermetabolic activity to suggest skeletal metastasis. Incidental CT findings: Surgical hardware from ACDF in the lower cervical spine. IMPRESSION: 1. Posterior apical right upper lobe 1.4 cm solid bandlike nodular opacity is non-hypermetabolic, most compatible with benign scarring. Recommend resuming lung cancer screening chest CT program in 6-12 months. 2. No hypermetabolic thoracic adenopathy or distant metastatic disease. 3. Coronary atherosclerosis. 4. Mild diffuse hepatic steatosis. 5. Chronic right maxillary sinusitis. Electronically Signed   By: Delbert PhenixJason A Poff M.D.   On: 05/04/2022 08:33     Assessment & Plan:   Right upper lobe pulmonary nodule - Patient has 1.4cm RUL pulmonary nodule which is non-hypermetabolic on PET imaging 05/01/22 - Continue annual screening with LDCT/lung cancer screening program - Smoking cessation continues to be strongly encouraged   Pulmonary emphysema (HCC) - Continue BREO and prn Albuterol   Tobacco use - Current smoker; a pack of cigarettes lasts 2-3 weeks.  - Continue Chantix - Advised patient continue to taper amount he is smoking and pick new quit date   Chronic sinusitis - Daily PND symptoms, worse in the morning. No tenderness/pain or purulent drainage.  - Continue loratadine 10mg  daily - If patient developed ongoing/acute symptoms would refer to ENT   Essential hypertension - BP 138/86 - Continue Diovan-HCTZ 160-25mg  daily  - Follow up with PCP      Glenford BayleyElizabeth W Maxwell Lemen, NP 05/04/2022

## 2022-05-06 NOTE — Progress Notes (Signed)
FYI I see that Beth ordered an LDCT for this guy in followup. Will it flow to the dashboard if its in her name?  Thanks,  BLI  Josephine Igo, DO [facility] 05/06/2022 11:10 PM

## 2022-05-21 ENCOUNTER — Encounter: Payer: Self-pay | Admitting: Medical-Surgical

## 2022-05-21 ENCOUNTER — Ambulatory Visit (INDEPENDENT_AMBULATORY_CARE_PROVIDER_SITE_OTHER): Payer: 59 | Admitting: Medical-Surgical

## 2022-05-21 VITALS — BP 132/83 | HR 71 | Resp 20 | Ht 67.0 in | Wt 237.1 lb

## 2022-05-21 DIAGNOSIS — M069 Rheumatoid arthritis, unspecified: Secondary | ICD-10-CM | POA: Diagnosis not present

## 2022-05-21 DIAGNOSIS — Z794 Long term (current) use of insulin: Secondary | ICD-10-CM

## 2022-05-21 DIAGNOSIS — E1169 Type 2 diabetes mellitus with other specified complication: Secondary | ICD-10-CM

## 2022-05-21 DIAGNOSIS — N521 Erectile dysfunction due to diseases classified elsewhere: Secondary | ICD-10-CM

## 2022-05-21 DIAGNOSIS — F322 Major depressive disorder, single episode, severe without psychotic features: Secondary | ICD-10-CM | POA: Diagnosis not present

## 2022-05-21 LAB — POCT GLYCOSYLATED HEMOGLOBIN (HGB A1C)
HbA1c, POC (controlled diabetic range): 6.1 % (ref 0.0–7.0)
Hemoglobin A1C: 6.1 % — AB (ref 4.0–5.6)

## 2022-05-21 MED ORDER — OZEMPIC (0.25 OR 0.5 MG/DOSE) 2 MG/3ML ~~LOC~~ SOPN: 0.5000 mg | PEN_INJECTOR | SUBCUTANEOUS | 1 refills | Status: DC

## 2022-05-21 MED ORDER — FLUTICASONE FUROATE-VILANTEROL 200-25 MCG/ACT IN AEPB: 1.0000 | INHALATION_SPRAY | Freq: Every day | RESPIRATORY_TRACT | 11 refills | Status: DC

## 2022-05-21 NOTE — Progress Notes (Signed)
        Established patient visit  History, exam, impression, and plan:  1. Type 2 diabetes mellitus with other specified complication, without long-term current use of insulin 52 year old male presenting today for follow-up on type 2 diabetes.  In January, his A1c was 7.9%.  He was started on semaglutide 0.25 mg weekly with continued metformin 500 mg twice daily.  Has been tolerating the medications well without side effects.  Has made multiple changes in his diet and is now walking at least 1 mile per day.  Has lost about 10 pounds in the last 3 months.  Up-to-date on preventative care.  Recent eye exam completed.  Not checking sugars at home because he was unable to find a glucometer that is covered by his insurance.  POCT hemoglobin A1c today 6.1% showing marked improvement.  Increase Ozempic to 0.5 mg weekly.  Continue metformin as prescribed.  Continue dietary changes and regular intentional exercise. - POCT HgB A1C  2. Major depressive disorder, single episode, severe Managed by psychiatry.  PHQ-9/GAD-7 updated today with good control of symptoms.  3. Class 2 severe obesity due to excess calories with serious comorbidity and body mass index (BMI) of 37.0 to 37.9 in adult Has experienced 10 pounds weight loss since starting Ozempic in January.  Now exercising and working on a heart healthy diet.  Increasing Ozempic to 0.5 mg which should help with continued weight loss efforts.  4. Rheumatoid arthritis involving multiple sites, unspecified whether rheumatoid factor present History of rheumatoid arthritis with multiple sites affected.  Went through multiple medications for management of this.  Now followed by pain management.  5. Erectile dysfunction due to diseases classified elsewhere Reports that he is having difficulty maintaining erections.  Interested in what he can do to improve this.  He does have an extensive heart history and is currently prescribed nitro for as needed use in the  setting of chest pain.  Avoiding sildenafil/tadalafil.  Since our options are limited, referring to urology. - Ambulatory referral to Urology   Procedures performed this visit: None.  Return in about 6 months (around 11/20/2022) for DM follow up.  __________________________________ Thayer Ohm, DNP, APRN, FNP-BC Primary Care and Sports Medicine [facility]

## 2022-06-03 ENCOUNTER — Encounter: Payer: Self-pay | Admitting: Urology

## 2022-06-03 ENCOUNTER — Encounter: Payer: 59 | Admitting: Urology

## 2022-06-03 NOTE — Progress Notes (Deleted)
Assessment: No diagnosis found.   Plan: ***  Chief Complaint: No chief complaint on file.   History of Present Illness:  [name] is a 52 y.o. male who is seen in consultation from Christen Butter, NP for evaluation of ED. Patient has a history of multiple medical comorbidities that make him high risk including diabetes, major depression, morbid obesity, rheumatoid arthritis, and extensive h/o coronary artery disease with stents on nitro prn as well as Brilinta and aspirin. Long history of tobacco abuse and unfortunately continues to smoke.  Walks with a cane.  Past Medical History:  Past Medical History:  Diagnosis Date   Acute ST elevation myocardial infarction (STEMI) due to occlusion of left anterior descending (LAD) coronary artery (HCC) 08/08/2021   mLAD 100% -> DES PCI Synergy DES 2.75 mm x 16 (3.25 mm); => mRCA90% -> staged PCI.    Anxiety    Arthritis    Asthma    history [id number]   Coronary artery disease involving native coronary artery of native heart with unstable angina pectoris (HCC) 08/08/2021   Ant STEMI - Culprit 100% mLAD (DES PCI); mRCA 90% (Staged PCI 7/17), OM1 50%.   Depression    Diabetes mellitus (HCC) 11/19/2021   Essential hypertension 12/11/2011   Gout    Headache    History of hiatal hernia    History of lithotripsy    Hypertension    Kidney stones    Lumbar degenerative disc disease 12/11/2011   Moderate obstructive sleep apnea 04/21/2021   Nephrolithiasis 12/11/2011   Pericarditis    Renal tubular acidosis    Rheumatoid arthritis(714.0)     Past Surgical History:  Past Surgical History:  Procedure Laterality Date   ANTERIOR CERVICAL DECOMP/DISCECTOMY FUSION     ANTERIOR CERVICAL DECOMP/DISCECTOMY FUSION N/A 11/25/2017   Procedure: Anterior Cervical Discectomy Fusion - Cervical Six-Cervical Seven, removal Cervical Five-Six plate;  Surgeon: Tia Alert, MD;  Location: [facility] OR;  Service: Neurosurgery;  Laterality: N/A;  Anterior  Cervical Discectomy Fusion - Cervical Six-Cervical Seven, removal Cervical Five-Six plate   CARPAL TUNNEL RELEASE Left 11/06/2015   Procedure: LEFT WRIST CARPAL TUNNEL RELEASE;  Surgeon: Bradly Bienenstock, MD;  Location: [facility] OR;  Service: Orthopedics;  Laterality: Left;   carpel tunnel surgery Right 04/23/2022   CORONARY STENT INTERVENTION N/A 08/11/2021   Procedure: CORONARY STENT INTERVENTION;  Surgeon: Corky Crafts, MD;  Location: [facility] INVASIVE CV LAB;  Service: Cardiovascular;  Laterality: N/A;   CORONARY ULTRASOUND/IVUS N/A 08/08/2021   Procedure: Intravascular Ultrasound/IVUS;  Surgeon: Corky Crafts, MD;  Location: [facility] INVASIVE CV LAB;  Service: Cardiovascular;  Laterality: N/A;   CORONARY/GRAFT ACUTE MI REVASCULARIZATION N/A 08/08/2021   Procedure: Coronary/Graft Acute MI Revascularization;  Surgeon: Corky Crafts, MD;  Location: [facility] INVASIVE CV LAB;  Service: Cardiovascular;  Laterality: N/A;   HEMORROIDECTOMY     KIDNEY STONE SURGERY     LEFT HEART CATH AND CORONARY ANGIOGRAPHY N/A 08/08/2021   Procedure: LEFT HEART CATH AND CORONARY ANGIOGRAPHY;  Surgeon: Corky Crafts, MD;  Location: [facility] INVASIVE CV LAB;  Service: Cardiovascular;  Laterality: N/A;   LEFT HEART CATH AND CORONARY ANGIOGRAPHY N/A 08/11/2021   Procedure: LEFT HEART CATH AND CORONARY ANGIOGRAPHY;  Surgeon: Corky Crafts, MD;  Location: [facility] INVASIVE CV LAB;  Service: Cardiovascular;  Laterality: N/A;   WRIST FUSION Left 11/06/2015   Procedure: ARTHRODESIS LEFT WRIST;  Surgeon: Bradly Bienenstock, MD;  Location: [facility] OR;  Service: Orthopedics;  Laterality: Left;  Allergies:  Allergies  Allergen Reactions   Lisinopril Cough   Benadryl [Diphenhydramine Hcl] Anxiety    Family History:  Family History  Problem Relation Age of Onset   Hypertension Father    Alcohol abuse Father    Heart attack Father 55   Diabetes Mellitus II Father    Coronary artery disease Father    Diabetes Mellitus II Mother         Micah Flesher into a diabetic coma, per pt.   Cancer - Colon Mother    Coronary artery disease Mother    Heart attack Mother 75   Ovarian cancer Mother    Uterine cancer Mother    Heart attack Brother    Coronary artery disease Brother    Heart murmur Sister    Dementia Neg Hx    Bipolar disorder Neg Hx    Depression Neg Hx    Drug abuse Neg Hx    OCD Neg Hx     Social History:  Social History   Tobacco Use   Smoking status: Every Day    Packs/day: 1.00    Years: 30.00    Additional pack years: 0.00    Total pack years: 30.00    Types: Cigarettes   Smokeless tobacco: Former    Types: Chew    Quit date: 1988   Tobacco comments:    Currently on Chantix; 2-3 cigarettes a day.   Vaping Use   Vaping Use: Never used  Substance Use Topics   Alcohol use: No   Drug use: No    Review of symptoms:  Constitutional:  Negative for unexplained weight loss, night sweats, fever, chills ENT:  Negative for nose bleeds, sinus pain, painful swallowing CV:  Negative for chest pain, shortness of breath, exercise intolerance, palpitations, loss of consciousness Resp:  Negative for cough, wheezing, shortness of breath GI:  Negative for nausea, vomiting, diarrhea, bloody stools GU:  Positives noted in HPI; otherwise negative for gross hematuria, dysuria, urinary incontinence Neuro:  Negative for seizures, poor balance, limb weakness, slurred speech Psych:  Negative for lack of energy, depression, anxiety Endocrine:  Negative for polydipsia, polyuria, symptoms of hypoglycemia (dizziness, hunger, sweating) Hematologic:  Negative for anemia, purpura, petechia, prolonged or excessive bleeding, use of anticoagulants  Allergic:  Negative for difficulty breathing or choking as a result of exposure to anything; no shellfish allergy; no allergic response (rash/itch) to materials, foods  Physical exam: There were no vitals taken for this visit. GENERAL APPEARANCE:  Well appearing, well developed, well  nourished, NAD HEENT: Atraumatic, Normocephalic, oropharynx clear. NECK: Supple without lymphadenopathy or thyromegaly. LUNGS: Clear to auscultation bilaterally. HEART: Regular Rate and Rhythm without murmurs, gallops, or rubs. ABDOMEN: Soft, non-tender, No Masses. EXTREMITIES: Moves all extremities well.  Without clubbing, cyanosis, or edema. NEUROLOGIC:  Alert and oriented x 3, normal gait, CN II-XII grossly intact.  MENTAL STATUS:  Appropriate. BACK:  Non-tender to palpation.  No CVAT SKIN:  Warm, dry and intact.    Results: No results found for this or any previous visit (from the past 24 hour(s)).

## 2022-06-17 ENCOUNTER — Encounter (HOSPITAL_COMMUNITY): Payer: Self-pay | Admitting: Psychiatry

## 2022-06-17 ENCOUNTER — Telehealth (INDEPENDENT_AMBULATORY_CARE_PROVIDER_SITE_OTHER): Payer: 59 | Admitting: Psychiatry

## 2022-06-17 DIAGNOSIS — F411 Generalized anxiety disorder: Secondary | ICD-10-CM | POA: Diagnosis not present

## 2022-06-17 DIAGNOSIS — F331 Major depressive disorder, recurrent, moderate: Secondary | ICD-10-CM

## 2022-06-17 DIAGNOSIS — F5102 Adjustment insomnia: Secondary | ICD-10-CM

## 2022-06-17 MED ORDER — ESCITALOPRAM OXALATE 10 MG PO TABS: 10.0000 mg | ORAL_TABLET | Freq: Every day | ORAL | 0 refills | Status: DC

## 2022-06-17 NOTE — Progress Notes (Signed)
Patient ID: SIR ROTZ, male   DOB: 1970-11-28, 52 y.o.   MRN: [id number] [name], [facility] Follow-up Outpatient Visit  [name] [id number] 52 y.o.  06/17/2022  Virtual Visit via Video Note  I connected with [name] on 06/17/22 at  2:00 PM EDT by a video enabled telemedicine application and verified that I am speaking with the correct person using two identifiers.  Location: Patient: home Provider: home office   I discussed the limitations of evaluation and management by telemedicine and the availability of in person appointments. The patient expressed understanding and agreed to proceed.     I discussed the assessment and treatment plan with the patient. The patient was provided an opportunity to ask questions and all were answered. The patient agreed with the plan and demonstrated an understanding of the instructions.   The patient was advised to call back or seek an in-person evaluation if the symptoms worsen or if the condition fails to improve as anticipated.  I provided 15 minutes of non-face-to-face time during this encounter.     Chief Complaint: follow up for depression and anxiety.   History of Present Illness:   Has had CAD /MI in August 2023 now on additional BP med, recovering and walking more. Cutting down nicotine   Has separated, and lives by a friend,has a camper  Feels better losing weight and says anxiety manageable  Marriage was not working out  Anxiety manageable   Aggravating factor: recent MI Modifying factor: home, family Depression manageable  Duration more then 10 years     Past Medical History:  Diagnosis Date   Acute ST elevation myocardial infarction (STEMI) due to occlusion of left anterior descending (LAD) coronary artery (HCC) 08/08/2021   mLAD 100% -> DES PCI Synergy DES 2.75 mm x 16 (3.25 mm); => mRCA90% -> staged PCI.    Anxiety    Arthritis    Asthma    history [id number]   Coronary artery disease  involving native coronary artery of native heart with unstable angina pectoris (HCC) 08/08/2021   Ant STEMI - Culprit 100% mLAD (DES PCI); mRCA 90% (Staged PCI 7/17), OM1 50%.   Depression    Diabetes mellitus (HCC) 11/19/2021   Essential hypertension 12/11/2011   Gout    Headache    History of hiatal hernia    History of lithotripsy    Hypertension    Kidney stones    Lumbar degenerative disc disease 12/11/2011   Moderate obstructive sleep apnea 04/21/2021   Nephrolithiasis 12/11/2011   Pericarditis    Renal tubular acidosis    Rheumatoid arthritis(714.0)    Family History  Problem Relation Age of Onset   Hypertension Father    Alcohol abuse Father    Heart attack Father 68   Diabetes Mellitus II Father    Coronary artery disease Father    Diabetes Mellitus II Mother        Micah Flesher into a diabetic coma, per pt.   Cancer - Colon Mother    Coronary artery disease Mother    Heart attack Mother 36   Ovarian cancer Mother    Uterine cancer Mother    Heart attack Brother    Coronary artery disease Brother    Heart murmur Sister    Dementia Neg Hx    Bipolar disorder Neg Hx    Depression Neg Hx    Drug abuse Neg Hx    OCD Neg Hx     Outpatient Encounter Medications as  of 06/17/2022  Medication Sig   albuterol (VENTOLIN HFA) 108 (90 Base) MCG/ACT inhaler Inhale 2 puffs into the lungs every 6 (six) hours as needed for wheezing.   aspirin 81 MG chewable tablet Chew 1 tablet (81 mg total) by mouth daily.   atorvastatin (LIPITOR) 80 MG tablet Take 1 tablet (80 mg total) by mouth daily.   blood glucose meter kit and supplies KIT Dispense based on patient and insurance preference. Use up to four times daily as directed. Please include lancets, test strips, control solution.   cyclobenzaprine (FLEXERIL) 10 MG tablet Take 1 tablet (10 mg total) by mouth 3 (three) times daily as needed for muscle spasms.   diclofenac Sodium (VOLTAREN) 1 % GEL Apply 1 Application topically daily as needed  (For pain).   escitalopram (LEXAPRO) 10 MG tablet Take 1 tablet (10 mg total) by mouth daily.   ezetimibe (ZETIA) 10 MG tablet Take 1 tablet (10 mg total) by mouth daily.   fluticasone furoate-vilanterol (BREO ELLIPTA) 200-25 MCG/ACT AEPB Inhale 1 puff into the lungs daily.   loratadine (CLARITIN) 10 MG tablet Take 1 tablet (10 mg total) by mouth daily.   metFORMIN (GLUCOPHAGE) 500 MG tablet Take 1 tablet (500 mg total) by mouth 2 (two) times daily with a meal.   metoprolol tartrate (LOPRESSOR) 25 MG tablet Take 0.5 tablets (12.5 mg total) by mouth 2 (two) times daily.   nitroGLYCERIN (NITROSTAT) 0.4 MG SL tablet Place 1 tablet (0.4 mg total) under the tongue every 5 (five) minutes as needed.   omeprazole (PRILOSEC) 40 MG capsule Take 1 capsule (40 mg total) by mouth daily.   oxyCODONE-acetaminophen (PERCOCET) 7.5-325 MG tablet Take 1 tablet by mouth 3 (three) times daily as needed.   pregabalin (LYRICA) 75 MG capsule Take 1 capsule (75 mg total) by mouth 2 (two) times daily.   promethazine (PHENERGAN) 25 MG tablet Take 1 tablet (25 mg total) by mouth every 6 (six) hours as needed for nausea or vomiting.   Semaglutide,0.25 or 0.5MG /DOS, (OZEMPIC, 0.25 OR 0.5 MG/DOSE,) 2 MG/3ML SOPN Inject 0.5 mg into the skin once a week.   ticagrelor (BRILINTA) 90 MG TABS tablet Take 1 tablet (90 mg total) by mouth 2 (two) times daily.   valsartan-hydrochlorothiazide (DIOVAN-HCT) 160-25 MG tablet Take 1 tablet by mouth daily.   varenicline (CHANTIX) 1 MG tablet Take 1 tablet (1 mg total) by mouth 2 (two) times daily.   varenicline (CHANTIX) 1 MG tablet Take 1 tablet (1 mg total) by mouth 2 (two) times daily.   [DISCONTINUED] escitalopram (LEXAPRO) 10 MG tablet Take 1 tablet (10 mg total) by mouth daily.   No facility-administered encounter medications on file as of 06/17/2022.    Recent Results (from the past 2160 hour(s))  Glucose, capillary     Status: Abnormal   Collection Time: 05/01/22  2:42 PM  Result  Value Ref Range   Glucose-Capillary 109 (H) 70 - 99 mg/dL    Comment: Glucose reference range applies only to samples taken after fasting for at least 8 hours.  POCT HgB A1C     Status: Abnormal   Collection Time: 05/21/22  8:54 AM  Result Value Ref Range   Hemoglobin A1C 6.1 (A) 4.0 - 5.6 %   HbA1c POC (<> result, manual entry)     HbA1c, POC (prediabetic range)     HbA1c, POC (controlled diabetic range) 6.1 0.0 - 7.0 %     There were no vitals taken for this visit.  Review of Systems  Cardiovascular:  Negative for chest pain.  Psychiatric/Behavioral:  Negative for depression and suicidal ideas.     Mental Status Examination  Appearance:casual Alert: Yes Attention: fair  Cooperative: Yes Eye Contact: Speech: coherent Psychomotor Activity: Normal Memory/Concentration: adequate Oriented: person, place, time/date and situation Mood: fair Affect:congruent Thought Processes and Associations: Coherent Fund of Knowledge: Fair Thought Content: Suicidal ideation and Homicidal ideation were denied. Denies hallucinations Insight: Fair Judgement: Fair  Diagnosis: Major depressive disorder recurrent moderate to severe. Generalized anxiety disorder. Insomnia. 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 Meds refill sent if due Thresa Ross, MD

## 2022-07-08 ENCOUNTER — Other Ambulatory Visit: Payer: Self-pay

## 2022-09-02 ENCOUNTER — Other Ambulatory Visit: Payer: Self-pay | Admitting: Nurse Practitioner

## 2022-09-02 DIAGNOSIS — E785 Hyperlipidemia, unspecified: Secondary | ICD-10-CM

## 2022-09-13 ENCOUNTER — Other Ambulatory Visit: Payer: Self-pay | Admitting: Medical-Surgical

## 2022-09-13 DIAGNOSIS — G894 Chronic pain syndrome: Secondary | ICD-10-CM

## 2022-09-18 ENCOUNTER — Other Ambulatory Visit: Payer: Self-pay | Admitting: Physical Medicine and Rehabilitation

## 2022-09-18 DIAGNOSIS — M5412 Radiculopathy, cervical region: Secondary | ICD-10-CM

## 2022-09-30 ENCOUNTER — Other Ambulatory Visit: Payer: Self-pay | Admitting: Nurse Practitioner

## 2022-09-30 ENCOUNTER — Telehealth (HOSPITAL_COMMUNITY): Payer: Self-pay | Admitting: *Deleted

## 2022-09-30 ENCOUNTER — Other Ambulatory Visit: Payer: Self-pay | Admitting: Medical-Surgical

## 2022-09-30 DIAGNOSIS — G894 Chronic pain syndrome: Secondary | ICD-10-CM

## 2022-09-30 DIAGNOSIS — E785 Hyperlipidemia, unspecified: Secondary | ICD-10-CM

## 2022-09-30 DIAGNOSIS — E1169 Type 2 diabetes mellitus with other specified complication: Secondary | ICD-10-CM

## 2022-09-30 MED ORDER — ESCITALOPRAM OXALATE 10 MG PO TABS: 10.0000 mg | ORAL_TABLET | Freq: Every day | ORAL | 0 refills | Status: DC

## 2022-09-30 NOTE — Telephone Encounter (Signed)
Rx REFILL REQUEST--  escitalopram (LEXAPRO) 10 MG tablet   [id number] - [place], [place] - [address]   NEXT APPT  10/14/22 LAST APPT    06/17/22

## 2022-09-30 NOTE — Addendum Note (Signed)
Addended by: Thresa Ross on: 09/30/2022 02:17 PM   Modules accepted: Orders

## 2022-10-13 ENCOUNTER — Telehealth: Payer: Self-pay | Admitting: Medical-Surgical

## 2022-10-13 ENCOUNTER — Other Ambulatory Visit: Payer: Self-pay | Admitting: Medical-Surgical

## 2022-10-13 MED ORDER — NITROGLYCERIN 0.4 MG SL SUBL: 0.4000 mg | SUBLINGUAL_TABLET | SUBLINGUAL | 2 refills | Status: DC | PRN

## 2022-10-13 NOTE — Telephone Encounter (Signed)
Patient Called in stating that he left his nitroGLYCERIN in his pocket ad washed. He doesn't not have any.

## 2022-10-14 ENCOUNTER — Encounter (HOSPITAL_COMMUNITY): Payer: Self-pay

## 2022-10-14 ENCOUNTER — Telehealth (HOSPITAL_COMMUNITY): Payer: 59 | Admitting: Psychiatry

## 2022-10-30 ENCOUNTER — Other Ambulatory Visit: Payer: Self-pay | Admitting: Medical-Surgical

## 2022-10-30 DIAGNOSIS — Z1212 Encounter for screening for malignant neoplasm of rectum: Secondary | ICD-10-CM

## 2022-10-30 DIAGNOSIS — Z1211 Encounter for screening for malignant neoplasm of colon: Secondary | ICD-10-CM

## 2022-11-11 ENCOUNTER — Other Ambulatory Visit: Payer: Self-pay | Admitting: Medical-Surgical

## 2022-11-11 DIAGNOSIS — G894 Chronic pain syndrome: Secondary | ICD-10-CM

## 2022-11-16 ENCOUNTER — Emergency Department (HOSPITAL_COMMUNITY): Payer: 59

## 2022-11-16 ENCOUNTER — Emergency Department (HOSPITAL_COMMUNITY)
Admission: EM | Admit: 2022-11-16 | Discharge: 2022-11-17 | Discharge status: Against Medical Advice | Payer: 59 | Attending: Emergency Medicine | Admitting: Emergency Medicine

## 2022-11-16 ENCOUNTER — Encounter (HOSPITAL_COMMUNITY): Payer: Self-pay

## 2022-11-16 ENCOUNTER — Other Ambulatory Visit: Payer: Self-pay

## 2022-11-16 DIAGNOSIS — Z5321 Procedure and treatment not carried out due to patient leaving prior to being seen by health care provider: Secondary | ICD-10-CM | POA: Diagnosis not present

## 2022-11-16 DIAGNOSIS — R0602 Shortness of breath: Secondary | ICD-10-CM | POA: Insufficient documentation

## 2022-11-16 DIAGNOSIS — R079 Chest pain, unspecified: Secondary | ICD-10-CM | POA: Insufficient documentation

## 2022-11-16 LAB — TROPONIN I (HIGH SENSITIVITY)
Troponin I (High Sensitivity): 7 ng/L (ref <18)
Troponin I (High Sensitivity): 8 ng/L (ref <18)

## 2022-11-16 LAB — BASIC METABOLIC PANEL
Anion gap: 9 (ref 5–15)
BUN: 13 mg/dL (ref 6–20)
CO2: 21 mmol/L — ABNORMAL LOW (ref 22–32)
Calcium: 9.2 mg/dL (ref 8.9–10.3)
Chloride: 104 mmol/L (ref 98–111)
Creatinine, Ser: 0.94 mg/dL (ref 0.61–1.24)
GFR, Estimated: >60 mL/min (ref >60)
Glucose, Bld: 113 mg/dL — ABNORMAL HIGH (ref 70–99)
Potassium: 3.6 mmol/L (ref 3.5–5.1)
Sodium: 134 mmol/L — ABNORMAL LOW (ref 135–145)

## 2022-11-16 LAB — CBC
HCT: 43.7 % (ref 39.0–52.0)
Hemoglobin: 14.8 g/dL (ref 13.0–17.0)
MCH: 29.4 pg (ref 26.0–34.0)
MCHC: 33.9 g/dL (ref 30.0–36.0)
MCV: 86.7 fL (ref 80.0–100.0)
Platelets: 336 10*3/uL (ref 150–400)
RBC: 5.04 MIL/uL (ref 4.22–5.81)
RDW: 13.3 % (ref 11.5–15.5)
WBC: 13.5 10*3/uL — ABNORMAL HIGH (ref 4.0–10.5)
nRBC: 0 % (ref 0.0–0.2)

## 2022-11-16 NOTE — ED Notes (Signed)
PA-C Shari at bedside for MSE screening in triage

## 2022-11-16 NOTE — ED Notes (Signed)
 Pt name called to see provider, no response

## 2022-11-16 NOTE — ED Provider Triage Note (Signed)
Emergency Medicine Provider Triage Evaluation Note  [name] , a 52 y.o. male  was evaluated in triage.  Pt complains of chest pain and SOB.  Review of Systems  Positive: Chest pain, sob Negative: vomiting  Physical Exam  BP (!) 178/110 (BP Location: Right Arm)   Pulse 72   Temp 98 F (36.7 C) (Oral)   Resp 18   Ht 5\' 7"  (1.702 m)   Wt 99.8 kg   SpO2 95%   BMI 34.46 kg/m  Gen:   Awake, no distress   Resp:  Normal effort  MSK:   Moves extremities without difficulty  Other:    Medical Decision Making  Medically screening exam initiated at 7:56 PM.  Appropriate orders placed.  CASTIEL MANWILLER was informed that the remainder of the evaluation will be completed by another provider, this initial triage assessment does not replace that evaluation, and the importance of remaining in the ED until their evaluation is complete.  Pain that started in his right shoulder and moved to left jaw and chest, radiating to left arm. He has nitroglycerin at home which did not offer any relief. No fever or cough. Reports h/o MI about a year ago, felt similar.    Elpidio Anis, PA-C 11/16/22 2001

## 2022-11-16 NOTE — ED Triage Notes (Signed)
Pt arrived POV for right sided shoulder pain for 2 weeks, 2 days ago pt reports left sided chest pain, that radiates into his clavicle and into his left arm, left forearm pain,and left hand finger tips tingling. Pt had a MI 07/2022 and had 2 stents placed, taking Brilinta 90 mg BID. Pt also reports SOB, and feels  "almost similar to MI a year ago, but feels lighter". Pt took NITOR SL, PTA w/o improvement. HTN noted, otherwise VSS, EKG NSR. PA-C Shari at bedside.

## 2022-11-17 ENCOUNTER — Telehealth: Payer: Self-pay | Admitting: General Practice

## 2022-11-17 NOTE — Transitions of Care (Post Inpatient/ED Visit) (Signed)
11/17/2022  Name: [name] MRN: [id number] DOB: [birth year]  Today's TOC FU Call Status: Today's TOC FU Call Status:: Successful TOC FU Call Completed TOC FU Call Complete Date: 11/17/22 Patient's Name and Date of Birth confirmed.  Transition Care Management Follow-up Telephone Call Date of Discharge: 11/17/22 Discharge Facility: [facility]) Type of Discharge: Emergency Department Reason for ED Visit: Cardiac Conditions Cardiac Conditions Diagnosis: Chest Pain Persisting How have you been since you were released from the hospital?: Better Any questions or concerns?: No  Items Reviewed: Did you receive and understand the discharge instructions provided?: Yes Medications obtained,verified, and reconciled?: Yes (Medications Reviewed) Any new allergies since your discharge?: No Dietary orders reviewed?: NA Do you have support at home?: Yes  Medications Reviewed Today: Medications Reviewed Today     Reviewed by Modesto Charon, RN (Registered Nurse) on 11/17/22 at 1635  Med List Status: <None>   Medication Order Taking? Sig Documenting Provider Last Dose Status Informant  albuterol (VENTOLIN HFA) 108 (90 Base) MCG/ACT inhaler [id number] No Inhale 2 puffs into the lungs every 6 (six) hours as needed for wheezing. Christen Butter, NP Taking Active   aspirin 81 MG chewable tablet [id number] No Chew 1 tablet (81 mg total) by mouth daily. Arty Baumgartner, NP Taking Active   atorvastatin (LIPITOR) 80 MG tablet [id number]  Take 1 tablet (80 mg total) by mouth daily. Pt needs to make appt with provider for further refills Corky Crafts, MD  Active   blood glucose meter kit and supplies KIT [id number] No Dispense based on patient and insurance preference. Use up to four times daily as directed. Please include lancets, test strips, control solution. Christen Butter, NP Taking Active   cyclobenzaprine (FLEXERIL) 10 MG tablet [id number]  TAKE 1 TABLET BY MOUTH THREE TIMES A DAY AS NEEDED FOR  MUSCLE SPASMS Christen Butter, NP  Active   diclofenac Sodium (VOLTAREN) 1 % GEL [id number] No Apply 1 Application topically daily as needed (For pain). [provider] Taking Active Self  escitalopram (LEXAPRO) 10 MG tablet [id number]  Take 1 tablet (10 mg total) by mouth daily. Thresa Ross, MD  Active   ezetimibe (ZETIA) 10 MG tablet [id number] No Take 1 tablet (10 mg total) by mouth daily. Corky Crafts, MD Taking Active   fluticasone furoate-vilanterol (BREO ELLIPTA) 200-25 MCG/ACT AEPB [id number]  Inhale 1 puff into the lungs daily. Christen Butter, NP  Active   loratadine (CLARITIN) 10 MG tablet [id number] No Take 1 tablet (10 mg total) by mouth daily. Josephine Igo, DO Taking Active   metFORMIN (GLUCOPHAGE) 500 MG tablet [id number]  TAKE 1 TABLET BY MOUTH 2 TIMES DAILY WITH A MEAL. Christen Butter, NP  Active   metoprolol tartrate (LOPRESSOR) 25 MG tablet [id number]  Take 0.5 tablets (12.5 mg total) by mouth 2 (two) times daily. Pt needs to schedule appt with provider for further refills Corky Crafts, MD  Active   nitroGLYCERIN (NITROSTAT) 0.4 MG SL tablet [id number]  Place 1 tablet (0.4 mg total) under the tongue every 5 (five) minutes as needed. Christen Butter, NP  Active   omeprazole (PRILOSEC) 40 MG capsule [id number] No Take 1 capsule (40 mg total) by mouth daily. Christen Butter, NP Taking Active   oxyCODONE-acetaminophen (PERCOCET) 7.5-325 MG tablet [id number] No Take 1 tablet by mouth 3 (three) times daily as needed. [provider] Taking Active Self  pregabalin (LYRICA) 75 MG capsule [id number] No Take 1 capsule (75 mg total) by mouth 2 (two)  times daily. Jomarie Longs, PA-C Taking Active Self           Med Note Debbe Bales   Mon Aug 25, 2021 10:15 AM)    promethazine (PHENERGAN) 25 MG tablet [id number] No Take 1 tablet (25 mg total) by mouth every 6 (six) hours as needed for nausea or vomiting. Sunnie Nielsen, DO Taking Active Self  Semaglutide,0.25 or  0.5MG /DOS, (OZEMPIC, 0.25 OR 0.5 MG/DOSE,) 2 MG/3ML SOPN [id number]  INJECT 0.5 MG INTO THE SKIN ONE TIME PER WEEK Christen Butter, NP  Active   ticagrelor (BRILINTA) 90 MG TABS tablet [id number]  Take 1 tablet (90 mg total) by mouth 2 (two) times daily. Pt needs to make appt with provider for further refills Corky Crafts, MD  Active   valsartan-hydrochlorothiazide (DIOVAN-HCT) 160-25 MG tablet [id number]  TAKE 1 TABLET BY MOUTH EVERY DAY Swinyer, Zachary George, NP  Active   varenicline (CHANTIX) 1 MG tablet [id number] No Take 1 tablet (1 mg total) by mouth 2 (two) times daily. Josephine Igo, DO Taking Active             Home Care and Equipment/Supplies: Were Home Health Services Ordered?: NA Any new equipment or medical supplies ordered?: NA  Functional Questionnaire: Do you need assistance with bathing/showering or dressing?: No Do you need assistance with meal preparation?: No Do you need assistance with eating?: No Do you have difficulty maintaining continence: No Do you need assistance with getting out of bed/getting out of a chair/moving?: No Do you have difficulty managing or taking your medications?: No  Follow up appointments reviewed: PCP Follow-up appointment confirmed?: Yes Date of PCP follow-up appointment?: 11/20/22 Follow-up Provider: Christen Butter, NP Specialist Hospital Follow-up appointment confirmed?: NA Do you need transportation to your follow-up appointment?: No Do you understand care options if your condition(s) worsen?: Yes-patient verbalized understanding  SDOH Interventions Today    Flowsheet Row Most Recent Value  SDOH Interventions   Transportation Interventions Intervention Not Indicated       SIGNATURE Modesto Charon, RN BSN Nurse Health Advisor

## 2022-11-20 ENCOUNTER — Ambulatory Visit (INDEPENDENT_AMBULATORY_CARE_PROVIDER_SITE_OTHER): Payer: 59 | Admitting: Medical-Surgical

## 2022-11-20 ENCOUNTER — Encounter: Payer: Self-pay | Admitting: Medical-Surgical

## 2022-11-20 VITALS — BP 164/91 | HR 66 | Resp 20 | Ht 67.0 in | Wt 245.1 lb

## 2022-11-20 DIAGNOSIS — Z716 Tobacco abuse counseling: Secondary | ICD-10-CM

## 2022-11-20 DIAGNOSIS — M542 Cervicalgia: Secondary | ICD-10-CM

## 2022-11-20 DIAGNOSIS — I10 Essential (primary) hypertension: Secondary | ICD-10-CM | POA: Diagnosis not present

## 2022-11-20 DIAGNOSIS — E1169 Type 2 diabetes mellitus with other specified complication: Secondary | ICD-10-CM

## 2022-11-20 DIAGNOSIS — Z09 Encounter for follow-up examination after completed treatment for conditions other than malignant neoplasm: Secondary | ICD-10-CM | POA: Diagnosis not present

## 2022-11-20 DIAGNOSIS — Z23 Encounter for immunization: Secondary | ICD-10-CM

## 2022-11-20 LAB — POCT GLYCOSYLATED HEMOGLOBIN (HGB A1C)
HbA1c, POC (controlled diabetic range): 6.5 % (ref 0.0–7.0)
Hemoglobin A1C: 6.5 % — AB (ref 4.0–5.6)

## 2022-11-20 LAB — POCT UA - MICROALBUMIN
Albumin/Creatinine Ratio, Urine, POC: <30
Creatinine, POC: 300 mg/dL
Microalbumin Ur, POC: 10 mg/L

## 2022-11-20 MED ORDER — FREESTYLE LIBRE 3 SENSOR MISC
3 refills | Status: DC
Start: 2022-11-20 — End: 2023-05-21

## 2022-11-20 MED ORDER — LIDOCAINE 5 % EX PTCH: 1.0000 | MEDICATED_PATCH | Freq: Two times a day (BID) | CUTANEOUS | 2 refills | Status: AC

## 2022-11-20 NOTE — Progress Notes (Unsigned)
        Established patient visit  History, exam, impression, and plan:  1. Hospital discharge follow-up Pleasant 52 year old male presenting today with reports of going to the ED on 10/21 with complaints of chest pain. He fell a couple of days prior to this and landed on his right shoulder. His right shoulder was hurting but it began to spread across his chest, into his left shoulder, and down the left arm. He developed tingling in the left arm and decided to report to the ED for evaluation. Labs, EKG, and CXR done but he was not seen due to the long wait. He left AMA and did not seek evaluation elsewhere. Today, he reports that the chest pain has resolved although he continues to experience some chest tightness at times. Continues to have left shoulder and neck pain. Hx of MI, on Brilinta. Has not seen cardiology in about a year but reports he will be seeing them soon. Reviewed hospital workup and results. No red flags, negative troponins, and EKG looks good. Strongly encouraged an asap follow up with cardiology as more testing may be necessary. Patient verbalized understanding.    2. Type 2 diabetes mellitus with other specified complication, without long-term current use of insulin (HCC) Hx of type 2 diabetes currently treated with Metformin 500mg  BID and Ozempic 0.25mg  weekly. Was instructed to go up to 0.5mg  weekly but did not realize the pen dial went higher than 0.25mg . Not checking sugars at home. Recheck of A1c today at 6.5%, controlled. Microalbumin normal. Foot exam competed. UTD on eye exam. Continue Metformin BID. Increase Ozempic to 0.5mg  weekly, instructions on pen use reviewed.  - POCT UA - Microalbumin - POCT HgB A1C - HM Diabetes Foot Exam  3. Essential hypertension Ideally managed by cardiology given his history but has not seen them in a year. Taking valsartan-hydrochlorothiazide 160-25mg  daily and metoprolol 12.5mg  twice daily. Tolerating bother medications well without side  effects. Has Nitrostat which was helpful with the above noted episode of chest pain. Taking Brilinta 90mg  twice daily along with a daily baby aspirin. Still smoking despite use of Chantix. BP elevated on arrival, remains so on recheck. Recommend monitoring BP at home with a goal of 130/80 or less. See cardiology asap regarding symptoms and BP management.  - Lipid panel  4. Neck pain on left side Neck pain noted above from left neck into the upper shoulder, worse with certain movements. Suspect MSK etiology from recent fall. Taking flexeril TID prn. NSAIDs contraindicated. Discussed topical analgesics. Lidocaine patches ordered. Home exercises provided.   5. Tobacco abuse counseling Ready to quit but has not been successful despite the use of Chantix. Has cut back drastically and a pack will last a couple of days now. Plans to continue cutting back as much as he can.   6. Need for influenza vaccination Flu vaccine given in office today.  - Flu vaccine trivalent PF, 6mos and older(Flulaval,Afluria,Fluarix,Fluzone)  7. Need for COVID-19 vaccine Covid vaccine given in office today.  - Pfizer Comirnaty Covid -[id number] Vaccine 59yrs and older   Procedures performed this visit: None.  Return in about 6 months (around 05/21/2023) for DM follow up.  __________________________________ Thayer Ohm, DNP, APRN, FNP-BC Primary Care and Sports Medicine [facility] [place]

## 2022-11-20 NOTE — Patient Instructions (Addendum)
Increase Ozempic to 0.5mg  weekly  Sent Freestyle libre 3 sensors to the pharmacy  Be on the look out for a call from Cardiology  Use lidocaine patches to the left neck and shoulder

## 2022-11-21 ENCOUNTER — Encounter: Payer: Self-pay | Admitting: Medical-Surgical

## 2022-11-21 LAB — LIPID PANEL
Chol/HDL Ratio: 5.1 ratio — ABNORMAL HIGH (ref 0.0–5.0)
Cholesterol, Total: 167 mg/dL (ref 100–199)
HDL: 33 mg/dL — ABNORMAL LOW (ref >39)
LDL Chol Calc (NIH): 110 mg/dL — ABNORMAL HIGH (ref 0–99)
Triglycerides: 132 mg/dL (ref 0–149)
VLDL Cholesterol Cal: 24 mg/dL (ref 5–40)

## 2022-12-10 ENCOUNTER — Other Ambulatory Visit: Payer: Self-pay | Admitting: Medical-Surgical

## 2022-12-10 DIAGNOSIS — G894 Chronic pain syndrome: Secondary | ICD-10-CM

## 2022-12-22 ENCOUNTER — Other Ambulatory Visit: Payer: Self-pay | Admitting: Interventional Cardiology

## 2022-12-22 ENCOUNTER — Other Ambulatory Visit: Payer: Self-pay | Admitting: Medical-Surgical

## 2022-12-22 ENCOUNTER — Other Ambulatory Visit (HOSPITAL_COMMUNITY): Payer: Self-pay | Admitting: Psychiatry

## 2023-01-06 DIAGNOSIS — M069 Rheumatoid arthritis, unspecified: Secondary | ICD-10-CM | POA: Diagnosis not present

## 2023-01-06 DIAGNOSIS — M47812 Spondylosis without myelopathy or radiculopathy, cervical region: Secondary | ICD-10-CM | POA: Diagnosis not present

## 2023-01-06 DIAGNOSIS — G894 Chronic pain syndrome: Secondary | ICD-10-CM | POA: Diagnosis not present

## 2023-01-06 DIAGNOSIS — M47817 Spondylosis without myelopathy or radiculopathy, lumbosacral region: Secondary | ICD-10-CM | POA: Diagnosis not present

## 2023-01-16 ENCOUNTER — Other Ambulatory Visit: Payer: Self-pay | Admitting: Medical-Surgical

## 2023-01-16 DIAGNOSIS — G894 Chronic pain syndrome: Secondary | ICD-10-CM

## 2023-02-12 ENCOUNTER — Other Ambulatory Visit: Payer: Self-pay | Admitting: Nurse Practitioner

## 2023-02-14 ENCOUNTER — Other Ambulatory Visit: Payer: Self-pay | Admitting: Medical-Surgical

## 2023-02-14 DIAGNOSIS — G894 Chronic pain syndrome: Secondary | ICD-10-CM

## 2023-03-01 ENCOUNTER — Ambulatory Visit (INDEPENDENT_AMBULATORY_CARE_PROVIDER_SITE_OTHER): Payer: 59

## 2023-03-01 DIAGNOSIS — Z87891 Personal history of nicotine dependence: Secondary | ICD-10-CM

## 2023-03-01 DIAGNOSIS — Z122 Encounter for screening for malignant neoplasm of respiratory organs: Secondary | ICD-10-CM

## 2023-03-01 DIAGNOSIS — F17210 Nicotine dependence, cigarettes, uncomplicated: Secondary | ICD-10-CM

## 2023-03-04 DIAGNOSIS — M47812 Spondylosis without myelopathy or radiculopathy, cervical region: Secondary | ICD-10-CM | POA: Diagnosis not present

## 2023-03-04 DIAGNOSIS — M069 Rheumatoid arthritis, unspecified: Secondary | ICD-10-CM | POA: Diagnosis not present

## 2023-03-04 DIAGNOSIS — G894 Chronic pain syndrome: Secondary | ICD-10-CM | POA: Diagnosis not present

## 2023-03-04 DIAGNOSIS — M47817 Spondylosis without myelopathy or radiculopathy, lumbosacral region: Secondary | ICD-10-CM | POA: Diagnosis not present

## 2023-03-06 ENCOUNTER — Other Ambulatory Visit: Payer: Self-pay | Admitting: Medical-Surgical

## 2023-03-06 DIAGNOSIS — E1169 Type 2 diabetes mellitus with other specified complication: Secondary | ICD-10-CM

## 2023-03-09 ENCOUNTER — Other Ambulatory Visit: Payer: Self-pay | Admitting: Acute Care

## 2023-03-09 DIAGNOSIS — F17210 Nicotine dependence, cigarettes, uncomplicated: Secondary | ICD-10-CM

## 2023-03-09 DIAGNOSIS — Z122 Encounter for screening for malignant neoplasm of respiratory organs: Secondary | ICD-10-CM

## 2023-03-09 DIAGNOSIS — Z87891 Personal history of nicotine dependence: Secondary | ICD-10-CM

## 2023-03-10 ENCOUNTER — Other Ambulatory Visit: Payer: Self-pay | Admitting: Physician Assistant

## 2023-03-14 ENCOUNTER — Other Ambulatory Visit: Payer: Self-pay | Admitting: Medical-Surgical

## 2023-03-14 DIAGNOSIS — G894 Chronic pain syndrome: Secondary | ICD-10-CM

## 2023-03-15 ENCOUNTER — Other Ambulatory Visit: Payer: Self-pay | Admitting: Medical-Surgical

## 2023-03-15 ENCOUNTER — Other Ambulatory Visit: Payer: Self-pay | Admitting: Interventional Cardiology

## 2023-03-15 DIAGNOSIS — E785 Hyperlipidemia, unspecified: Secondary | ICD-10-CM

## 2023-03-18 ENCOUNTER — Ambulatory Visit: Payer: Self-pay | Admitting: Medical-Surgical

## 2023-03-18 ENCOUNTER — Telehealth (INDEPENDENT_AMBULATORY_CARE_PROVIDER_SITE_OTHER): Payer: 59 | Admitting: Family Medicine

## 2023-03-18 ENCOUNTER — Encounter: Payer: Self-pay | Admitting: Family Medicine

## 2023-03-18 VITALS — Ht 69.0 in | Wt 230.0 lb

## 2023-03-18 DIAGNOSIS — Z20828 Contact with and (suspected) exposure to other viral communicable diseases: Secondary | ICD-10-CM | POA: Insufficient documentation

## 2023-03-18 DIAGNOSIS — R0602 Shortness of breath: Secondary | ICD-10-CM | POA: Diagnosis not present

## 2023-03-18 MED ORDER — OSELTAMIVIR PHOSPHATE 75 MG PO CAPS: 75.0000 mg | ORAL_CAPSULE | Freq: Every day | ORAL | 0 refills | Status: AC

## 2023-03-18 MED ORDER — ALBUTEROL SULFATE HFA 108 (90 BASE) MCG/ACT IN AERS: 2.0000 | INHALATION_SPRAY | Freq: Four times a day (QID) | RESPIRATORY_TRACT | 11 refills | Status: AC | PRN

## 2023-03-18 NOTE — Telephone Encounter (Signed)
Copied from CRM [id number]. Topic: Clinical - Pink Word Triage >> Mar 18, 2023  3:21 PM Kristie Cowman wrote: Reason for Triage: bad cold, fever, chills, chest hurting from coughing.   Granddaughter with strep and positive flu today.  Chief Complaint: Flu symptoms Symptoms: Cough, congestion, aches, chills, fever Frequency: 1-2 days Pertinent Negatives: Patient denies relief Disposition: [] ED /[] Urgent Care (no appt availability in office) / [x] Appointment(In office/virtual)/ []  [facility] [facility]/ [] Home Care/ [] Refused Recommended Disposition /[] [place] [facility]/ []  Follow-up with PCP Additional Notes: Patient called in to report a variety of flu symptoms. Patient's granddaughter recently tested positive for the flu and strep. Patient is experiencing frequent coughing spells, congestion, body aches and chills. Patient has a fever of 101. Patient stated the coughing spells make him feel SOB at times. Patient denied SOB outside of the coughing spells. Patient able to speak in clear and complete sentences while on the phone with this RN. This RN advised patient to be seen within 24 hours. Patient requested a virtual visit today. No availability with PCP or PCP office. Scheduled same day virtual visit with provider at alternate office. This RN advised patient to call back if symptoms worsen. Patient complied.   Reason for Disposition  [1] Continuous (nonstop) coughing interferes with work or school AND [2] no improvement using cough treatment per Care Advice  Answer Assessment - Initial Assessment Questions 1. ONSET: "When did the cough begin?"      1-2 days 2. SEVERITY: "How bad is the cough today?"      States he has coughing spells every 5-10 minutes, cough is making chest hurt 3. SPUTUM: "Describe the color of your sputum" (none, dry cough; clear, white, yellow, green)     Anything that does come up is green/yellow 4. HEMOPTYSIS: "Are you coughing up any blood?" If so ask: "How much?"  (flecks, streaks, tablespoons, etc.)     Denies 5. DIFFICULTY BREATHING: "Are you having difficulty breathing?" If Yes, ask: "How bad is it?" (e.g., mild, moderate, severe)    - MILD: No SOB at rest, mild SOB with walking, speaks normally in sentences, can lie down, no retractions, pulse < 100.    - MODERATE: SOB at rest, SOB with minimal exertion and prefers to sit, cannot lie down flat, speaks in phrases, mild retractions, audible wheezing, pulse 100-120.    - SEVERE: Very SOB at rest, speaks in single words, struggling to breathe, sitting hunched forward, retractions, pulse > 120      Denies, states coughing spells cause SOB 6. FEVER: "Do you have a fever?" If Yes, ask: "What is your temperature, how was it measured, and when did it start?"     101 taken orally about an hour ago 7. CARDIAC HISTORY: "Do you have any history of heart disease?" (e.g., heart attack, congestive heart failure)      2 heart stents 8. LUNG HISTORY: "Do you have any history of lung disease?"  (e.g., pulmonary embolus, asthma, emphysema)     COPD 9. PE RISK FACTORS: "Do you have a history of blood clots?" (or: recent major surgery, recent prolonged travel, bedridden)     Denies 10. OTHER SYMPTOMS: "Do you have any other symptoms?" (e.g., runny nose, wheezing, chest pain)       Body aches, chills, headache, congestion, denies sore throat  Protocols used: Cough - Acute Non-Productive-A-AH

## 2023-03-18 NOTE — Progress Notes (Signed)
Virtual Visit via Video Note   This visit type was conducted due to national recommendations for restrictions regarding the COVID-19 Pandemic (e.g. social distancing) in an effort to limit this patient's exposure and mitigate transmission in our community.  Due to his co-morbid illnesses, this patient is at least at moderate risk for complications without adequate follow up.  This format is felt to be most appropriate for this patient at this time.   All issues noted in this document were discussed and addressed.  No physical exam could be performed with this format.  Patient verbally consented to a video visit.   Date:  03/18/2023   ID:  [name], DOB 05-Sep-1970, MRN [id number]  Patient Location: Home Provider Location: Office/Clinic  PCP:  Christen Butter, NP   Chief Complaint:  cough, fever, chills, runny nose, headache  History of Present Illness:     [name] is a 53 y.o. male with complaints of cough, fever, chills runny nose and headache. 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 She was diagnosed today with Flu and strep throat. He reports that his symptoms started two days ago. He reports a fever of 101 this morning and he took ibuprofen and the fever and chills are better now. He is disabled and doesn't work. He hasn't got out of bed much since his symptoms started and is drinking plenty of fluids.   The patient does have symptoms concerning for COVID-19 infection (fever, chills, cough, or new shortness of breath).   HPI  Past Medical History:  Diagnosis Date   Acute ST elevation myocardial infarction (STEMI) due to occlusion of left anterior descending (LAD) coronary artery (HCC) 08/08/2021   mLAD 100% -> DES PCI Synergy DES 2.75 mm x 16 (3.25 mm); => mRCA90% -> staged PCI.    Anxiety    Arthritis    Asthma    history [id number]   Coronary artery disease involving native coronary artery of native heart with unstable angina pectoris (HCC)  08/08/2021   Ant STEMI - Culprit 100% mLAD (DES PCI); mRCA 90% (Staged PCI 7/17), OM1 50%.   Depression    Diabetes mellitus (HCC) 11/19/2021   Essential hypertension 12/11/2011   Gout    Headache    History of hiatal hernia    History of lithotripsy    Hypertension    Kidney stones    Lumbar degenerative disc disease 12/11/2011   Moderate obstructive sleep apnea 04/21/2021   Nephrolithiasis 12/11/2011   Pericarditis    Renal tubular acidosis    Rheumatoid arthritis(714.0)    Past Surgical History:  Procedure Laterality Date   ANTERIOR CERVICAL DECOMP/DISCECTOMY FUSION     ANTERIOR CERVICAL DECOMP/DISCECTOMY FUSION N/A 11/25/2017   Procedure: Anterior Cervical Discectomy Fusion - Cervical Six-Cervical Seven, removal Cervical Five-Six plate;  Surgeon: Tia Alert, MD;  Location[facility];  Service: Neurosurgery;  Laterality: N/A;  Anterior Cervical Discectomy Fusion - Cervical Six-Cervical Seven, removal Cervical Five-Six plate   CARPAL TUNNEL RELEASE Left 11/06/2015   Procedure: LEFT WRIST CARPAL TUNNEL RELEASE;  Surgeon: Bradly Bienenstock, MD;  Location: [facility] OR;  Service: Orthopedics;  Laterality: Left;   carpel tunnel surgery Right 04/23/2022   CORONARY STENT INTERVENTION N/A 08/11/2021   Procedure: CORONARY STENT INTERVENTION;  Surgeon: Corky Crafts, MD;  Location: [facility] INVASIVE CV LAB;  Service: Cardiovascular;  Laterality: N/A;   CORONARY ULTRASOUND/IVUS N/A 08/08/2021   Procedure: Intravascular Ultrasound/IVUS;  Surgeon: Corky Crafts, MD;  Location: [facility] INVASIVE  CV LAB;  Service: Cardiovascular;  Laterality: N/A;   CORONARY/GRAFT ACUTE MI REVASCULARIZATION N/A 08/08/2021   Procedure: Coronary/Graft Acute MI Revascularization;  Surgeon: Corky Crafts, MD;  Location: [facility] INVASIVE CV LAB;  Service: Cardiovascular;  Laterality: N/A;   HEMORROIDECTOMY     KIDNEY STONE SURGERY     LEFT HEART CATH AND CORONARY ANGIOGRAPHY N/A 08/08/2021   Procedure: LEFT HEART CATH AND  CORONARY ANGIOGRAPHY;  Surgeon: Corky Crafts, MD;  Location: [facility] INVASIVE CV LAB;  Service: Cardiovascular;  Laterality: N/A;   LEFT HEART CATH AND CORONARY ANGIOGRAPHY N/A 08/11/2021   Procedure: LEFT HEART CATH AND CORONARY ANGIOGRAPHY;  Surgeon: Corky Crafts, MD;  Location: [facility] INVASIVE CV LAB;  Service: Cardiovascular;  Laterality: N/A;   WRIST FUSION Left 11/06/2015   Procedure: ARTHRODESIS LEFT WRIST;  Surgeon: Bradly Bienenstock, MD;  Location: [facility] OR;  Service: Orthopedics;  Laterality: Left;    @FHX @  Current Meds  Medication Sig   oseltamivir (TAMIFLU) 75 MG capsule Take 1 capsule (75 mg total) by mouth daily for 5 days.   pregabalin (LYRICA) 100 MG capsule Take 100 mg by mouth 2 (two) times daily.     Allergies:   Lisinopril and Benadryl [diphenhydramine hcl]   Social History   Tobacco Use   Smoking status: Every Day    Current packs/day: 1.00    Average packs/day: 1 pack/day for 30.0 years (30.0 ttl pk-yrs)    Types: Cigarettes   Smokeless tobacco: Former    Types: Chew    Quit date: 1988   Tobacco comments:    Currently on Chantix; 2-3 cigarettes a day.   Vaping Use   Vaping status: Never Used  Substance Use Topics   Alcohol use: No   Drug use: No     Family Hx: The patient's family history includes Alcohol abuse in his father; Cancer - Colon in his mother; Coronary artery disease in his brother, father, and mother; Diabetes Mellitus II in his father and mother; Heart attack in his brother; Heart attack (age of onset: 64) in his mother; Heart attack (age of onset: 54) in his father; Heart murmur in his sister; Hypertension in his father; Ovarian cancer in his mother; Uterine cancer in his mother. There is no history of Dementia, Bipolar disorder, Depression, Drug abuse, or OCD.  Review of Systems  Constitutional:  Positive for chills, fever (101) and malaise/fatigue.  HENT:  Negative for sore throat.   Respiratory:  Positive for cough (productive -  green/yellow). Negative for shortness of breath.   Cardiovascular:  Negative for chest pain.  Gastrointestinal:  Negative for abdominal pain, constipation, diarrhea, nausea and vomiting.  Genitourinary:  Negative for dysuria, frequency and urgency.  Musculoskeletal:  Positive for myalgias (body aches).  Neurological:  Positive for headaches. Negative for weakness.     Labs/Other Tests and Data Reviewed:    Recent Labs: 11/16/2022: BUN 13; Creatinine, Ser 0.94; Hemoglobin 14.8; Platelets 336; Potassium 3.6; Sodium 134   Recent Lipid Panel Lab Results  Component Value Date/Time   CHOL 167 11/20/2022 09:36 AM   TRIG 132 11/20/2022 09:36 AM   HDL 33 (L) 11/20/2022 09:36 AM   CHOLHDL 5.1 (H) 11/20/2022 09:36 AM   CHOLHDL 6.0 08/09/2021 06:34 AM   LDLCALC 110 (H) 11/20/2022 09:36 AM   LDLCALC 155 (H) 04/03/2021 12:00 AM    Wt Readings from Last 3 Encounters:  03/18/23 230 lb (104.3 kg)  03/01/23 242 lb (109.8 kg)  11/20/22 245 lb 1.3 oz (  111.2 kg)     Objective:    Vital Signs:  Ht 5\' 9"  (1.753 m)   Wt 230 lb (104.3 kg)   BMI 33.97 kg/m    VITAL SIGNS:  reviewed GEN:  no acute distress EYES:  sclerae anicteric, EOMI - Extraocular Movements Intact RESPIRATORY:  normal respiratory effort, symmetric expansion CARDIOVASCULAR:  no peripheral edema SKIN:  no rash, lesions or ulcers. MUSCULOSKELETAL:  no obvious deformities. NEURO:  alert and oriented x 3, no obvious focal deficit PSYCH:  normal affect  ASSESSMENT & PLAN:    SOB (shortness of breath) Refill for albuterol sent to pharmacy  Exposure to influenza Acute Increase fluid intake, rest and practice good hand hygeine Throw away your toothbrush and start using a new one Albuterol 2 puffs into the lungs every 6 hours as needed for wheezing or shortness of breath.  Try lemon and honey and/or cough drops for cough  Tamiflu 75 mg by mouth TWICE A DAY for 5 days       Time:   Today, I have spent 10 minutes with  the patient with telehealth technology discussing the above problems.     Medication Adjustments/Labs and Tests Ordered: Current medicines are reviewed at length with the patient today.  Concerns regarding medicines are outlined above.   Tests Ordered: No orders of the defined types were placed in this encounter.   Medication Changes: Meds ordered this encounter  Medications   oseltamivir (TAMIFLU) 75 MG capsule    Sig: Take 1 capsule (75 mg total) by mouth daily for 5 days.    Dispense:  5 capsule    Refill:  0   albuterol (VENTOLIN HFA) 108 (90 Base) MCG/ACT inhaler    Sig: Inhale 2 puffs into the lungs every 6 (six) hours as needed for wheezing.    Dispense:  2 each    Refill:  11    Follow Up:  In Person prn  Signed, Lajuana Matte, FNP [facility] [phone]  03/18/2023 5:09 PM

## 2023-03-18 NOTE — Telephone Encounter (Signed)
Message from [place] M sent at 03/18/2023  3:23 PM EST  Copied From CRM [id number]. Reason for Triage: bad cold, fever, chills, chest hurting from coughing.  Granddaughter with strep and positive flu today.

## 2023-03-18 NOTE — Assessment & Plan Note (Signed)
Refill for albuterol sent to pharmacy. 

## 2023-03-18 NOTE — Assessment & Plan Note (Signed)
Acute Increase fluid intake, rest and practice good hand hygeine Throw away your toothbrush and start using a new one Albuterol 2 puffs into the lungs every 6 hours as needed for wheezing or shortness of breath.  Try lemon and honey and/or cough drops for cough  Tamiflu 75 mg by mouth TWICE A DAY for 5 days

## 2023-03-19 ENCOUNTER — Other Ambulatory Visit: Payer: Self-pay | Admitting: Nurse Practitioner

## 2023-03-25 ENCOUNTER — Other Ambulatory Visit: Payer: Self-pay | Admitting: Nurse Practitioner

## 2023-03-25 DIAGNOSIS — E785 Hyperlipidemia, unspecified: Secondary | ICD-10-CM

## 2023-04-02 ENCOUNTER — Other Ambulatory Visit: Payer: Self-pay | Admitting: Nurse Practitioner

## 2023-04-10 ENCOUNTER — Other Ambulatory Visit: Payer: Self-pay | Admitting: Medical-Surgical

## 2023-04-10 DIAGNOSIS — G894 Chronic pain syndrome: Secondary | ICD-10-CM

## 2023-04-13 ENCOUNTER — Other Ambulatory Visit: Payer: Self-pay | Admitting: Interventional Cardiology

## 2023-04-13 DIAGNOSIS — E785 Hyperlipidemia, unspecified: Secondary | ICD-10-CM

## 2023-04-15 NOTE — Telephone Encounter (Signed)
 Called Pt and attempted to leave a VM. Mailbox was full.

## 2023-04-15 NOTE — Telephone Encounter (Signed)
 Former Dr. Eldridge Dace pt. Hasn't been seen since Dr. Eldridge Dace left and is past his 3rd attempt. Should we refill? Please advise

## 2023-04-26 DIAGNOSIS — M069 Rheumatoid arthritis, unspecified: Secondary | ICD-10-CM | POA: Diagnosis not present

## 2023-04-26 DIAGNOSIS — G894 Chronic pain syndrome: Secondary | ICD-10-CM | POA: Diagnosis not present

## 2023-04-26 DIAGNOSIS — M47817 Spondylosis without myelopathy or radiculopathy, lumbosacral region: Secondary | ICD-10-CM | POA: Diagnosis not present

## 2023-04-26 DIAGNOSIS — M47812 Spondylosis without myelopathy or radiculopathy, cervical region: Secondary | ICD-10-CM | POA: Diagnosis not present

## 2023-05-06 ENCOUNTER — Telehealth: Payer: Self-pay | Admitting: Interventional Cardiology

## 2023-05-06 DIAGNOSIS — E785 Hyperlipidemia, unspecified: Secondary | ICD-10-CM

## 2023-05-06 NOTE — Telephone Encounter (Signed)
*  STAT* If patient is at the pharmacy, call can be transferred to refill team.   1. Which medications need to be refilled? (please list name of each medication and dose if known)   atorvastatin (LIPITOR) 80 MG tablet   2. Would you like to learn more about the convenience, safety, & potential cost savings by using the [facility]?   3. Are you open to using the [facility] (Type [facility]. ).  4. Which pharmacy/location (including street and city if local pharmacy) is medication to be sent to?  [id number] - [place], [place] - [address]   5. Do they need a 30 day or 90 day supply? 90 day  Caller (Melissa) stated patient is completely out of this medication.

## 2023-05-07 ENCOUNTER — Other Ambulatory Visit: Payer: Self-pay | Admitting: Medical-Surgical

## 2023-05-07 DIAGNOSIS — E1169 Type 2 diabetes mellitus with other specified complication: Secondary | ICD-10-CM

## 2023-05-07 NOTE — Telephone Encounter (Signed)
 Pt has not been seen since 2023 and has numerous attempt asking to make overdue appt with Cardiologist and pt has not done so. This is Dr. Eldridge Dace pt and Dr. Eldridge Dace is no longer with our office. Does pt need to make an appt or refill with PCP? I attempted to call the pt, but pt's mail box was full. Please address

## 2023-05-08 ENCOUNTER — Other Ambulatory Visit: Payer: Self-pay | Admitting: Medical-Surgical

## 2023-05-08 DIAGNOSIS — G894 Chronic pain syndrome: Secondary | ICD-10-CM

## 2023-05-21 ENCOUNTER — Encounter: Payer: Self-pay | Admitting: Medical-Surgical

## 2023-05-21 ENCOUNTER — Ambulatory Visit (INDEPENDENT_AMBULATORY_CARE_PROVIDER_SITE_OTHER): Payer: 59 | Admitting: Medical-Surgical

## 2023-05-21 VITALS — BP 147/92 | HR 73 | Resp 20 | Ht 69.0 in | Wt 237.0 lb

## 2023-05-21 DIAGNOSIS — I10 Essential (primary) hypertension: Secondary | ICD-10-CM | POA: Diagnosis not present

## 2023-05-21 DIAGNOSIS — E1169 Type 2 diabetes mellitus with other specified complication: Secondary | ICD-10-CM

## 2023-05-21 DIAGNOSIS — F322 Major depressive disorder, single episode, severe without psychotic features: Secondary | ICD-10-CM | POA: Diagnosis not present

## 2023-05-21 DIAGNOSIS — J439 Emphysema, unspecified: Secondary | ICD-10-CM

## 2023-05-21 DIAGNOSIS — M069 Rheumatoid arthritis, unspecified: Secondary | ICD-10-CM

## 2023-05-21 DIAGNOSIS — Z6837 Body mass index (BMI) 37.0-37.9, adult: Secondary | ICD-10-CM

## 2023-05-21 DIAGNOSIS — Z1211 Encounter for screening for malignant neoplasm of colon: Secondary | ICD-10-CM | POA: Diagnosis not present

## 2023-05-21 DIAGNOSIS — E66812 Obesity, class 2: Secondary | ICD-10-CM

## 2023-05-21 LAB — POCT GLYCOSYLATED HEMOGLOBIN (HGB A1C)
HbA1c, POC (controlled diabetic range): 6.2 % (ref 0.0–7.0)
Hemoglobin A1C: 6.2 % — AB (ref 4.0–5.6)

## 2023-05-21 MED ORDER — FLUTICASONE FUROATE-VILANTEROL 200-25 MCG/ACT IN AEPB: 1.0000 | INHALATION_SPRAY | Freq: Every day | RESPIRATORY_TRACT | 11 refills | Status: DC

## 2023-05-21 MED ORDER — SEMAGLUTIDE (1 MG/DOSE) 4 MG/3ML ~~LOC~~ SOPN: 1.0000 mg | PEN_INJECTOR | SUBCUTANEOUS | 1 refills | Status: DC

## 2023-05-21 MED ORDER — METFORMIN HCL 500 MG PO TABS: 500.0000 mg | ORAL_TABLET | Freq: Two times a day (BID) | ORAL | 0 refills | Status: DC

## 2023-05-21 NOTE — Progress Notes (Signed)
        Established patient visit  History, exam, impression, and plan:  1. Pulmonary emphysema, unspecified emphysema type (HCC) (Primary) Very pleasant 53 year old male presenting today with history of pulmonary emphysema.  Currently using Breo Ellipta  1 puff daily and albuterol  as needed.  Tolerating both medications well without side effects.  Feels that his breathing is good and denies any shortness of breath.  Continue Breo Ellipta  and albuterol  as prescribed. - fluticasone  furoate-vilanterol (BREO ELLIPTA ) 200-25 MCG/ACT AEPB; Inhale 1 puff into the lungs daily.  Dispense: 1 each; Refill: 11  2. Type 2 diabetes mellitus with other specified complication, without long-term current use of insulin (HCC) History of type 2 diabetes currently treated with metformin  500 mg twice daily and Ozempic  0.5 mg weekly.  Tolerating both medications with no SE.  Previous hemoglobin A1c 6.5% approximately 6 months ago.  Recheck today at 6.2% indicating excellent control.  Given that he continues to struggle with weight, increasing Ozempic  to 1 mg weekly and continuing metformin  as prescribed. - POCT HgB A1C - metFORMIN  (GLUCOPHAGE ) 500 MG tablet; Take 1 tablet (500 mg total) by mouth 2 (two) times daily with a meal.  Dispense: 180 tablet; Refill: 0  3. Rheumatoid arthritis involving hand, unspecified laterality, unspecified whether rheumatoid factor present (HCC) Managed by the pain clinic.  4. Class 2 severe obesity due to excess calories with serious comorbidity and body mass index (BMI) of 37.0 to 37.9 in adult [facility]) As noted above, he does continue to have weight issues.  This is not helping his lower extremity joint pain.  He is active as tolerated and has been working on making healthy dietary decisions.  Increasing Ozempic  to 1 mg weekly to see if this will provide more benefit with weight loss.  5. Major depressive disorder, single episode, severe (HCC) Taking Lexapro  10 mg daily, tolerating well  without side effects.  Feels that the medication is working well although there are situational stressors that cause intermittent anxiety.  These are infrequent and usually managed with conservative measures.  Denies SI/HI.  Continue Lexapro  as prescribed.  6. Essential hypertension History of essential hypertension managed by cardiology.  Blood pressure is elevated today.  He is taking metoprolol  and valsartan -HCTZ as prescribed but is overdue for an appointment.  Plan to check labs as below.  Continue metoprolol  and valsartan -HCTZ but strongly recommend setting up an appointment to follow-up with cardiology.  Cardiologist name and contact information provided with AVS. - CBC with Differential/Platelet - CMP14+EGFR - Lipid panel  Procedures performed this visit: None.  Return for skin tag removal at your convenience; 6 months for chronic disease follow up.  __________________________________ Maryl Snook, DNP, APRN, FNP-BC Primary Care and Sports Medicine [facility] [place]

## 2023-05-22 ENCOUNTER — Other Ambulatory Visit: Payer: Self-pay | Admitting: Medical-Surgical

## 2023-05-22 DIAGNOSIS — E785 Hyperlipidemia, unspecified: Secondary | ICD-10-CM

## 2023-05-22 LAB — CMP14+EGFR
ALT: 24 IU/L (ref 0–44)
AST: 25 IU/L (ref 0–40)
Albumin: 4.4 g/dL (ref 3.8–4.9)
Alkaline Phosphatase: 67 IU/L (ref 44–121)
BUN/Creatinine Ratio: 11 (ref 9–20)
BUN: 10 mg/dL (ref 6–24)
Bilirubin Total: 0.5 mg/dL (ref 0.0–1.2)
CO2: 21 mmol/L (ref 20–29)
Calcium: 9.5 mg/dL (ref 8.7–10.2)
Chloride: 100 mmol/L (ref 96–106)
Creatinine, Ser: 0.93 mg/dL (ref 0.76–1.27)
Globulin, Total: 3 g/dL (ref 1.5–4.5)
Glucose: 74 mg/dL (ref 70–99)
Potassium: 4.5 mmol/L (ref 3.5–5.2)
Sodium: 136 mmol/L (ref 134–144)
Total Protein: 7.4 g/dL (ref 6.0–8.5)
eGFR: 98 mL/min/{1.73_m2} (ref >59)

## 2023-05-22 LAB — LIPID PANEL
Chol/HDL Ratio: 6.8 ratio — ABNORMAL HIGH (ref 0.0–5.0)
Cholesterol, Total: 212 mg/dL — ABNORMAL HIGH (ref 100–199)
HDL: 31 mg/dL — ABNORMAL LOW (ref >39)
LDL Chol Calc (NIH): 163 mg/dL — ABNORMAL HIGH (ref 0–99)
Triglycerides: 99 mg/dL (ref 0–149)
VLDL Cholesterol Cal: 18 mg/dL (ref 5–40)

## 2023-05-22 LAB — CBC WITH DIFFERENTIAL/PLATELET
Basophils Absolute: 0.1 10*3/uL (ref 0.0–0.2)
Basos: 1 %
EOS (ABSOLUTE): 0.1 10*3/uL (ref 0.0–0.4)
Eos: 1 %
Hematocrit: 47.3 % (ref 37.5–51.0)
Hemoglobin: 16 g/dL (ref 13.0–17.7)
Immature Grans (Abs): 0 10*3/uL (ref 0.0–0.1)
Immature Granulocytes: 0 %
Lymphocytes Absolute: 1.9 10*3/uL (ref 0.7–3.1)
Lymphs: 22 %
MCH: 30.4 pg (ref 26.6–33.0)
MCHC: 33.8 g/dL (ref 31.5–35.7)
MCV: 90 fL (ref 79–97)
Monocytes Absolute: 0.9 10*3/uL (ref 0.1–0.9)
Monocytes: 10 %
Neutrophils Absolute: 5.8 10*3/uL (ref 1.4–7.0)
Neutrophils: 66 %
Platelets: 329 10*3/uL (ref 150–450)
RBC: 5.27 x10E6/uL (ref 4.14–5.80)
RDW: 12.9 % (ref 11.6–15.4)
WBC: 8.8 10*3/uL (ref 3.4–10.8)

## 2023-05-22 MED ORDER — ATORVASTATIN CALCIUM 80 MG PO TABS: 80.0000 mg | ORAL_TABLET | Freq: Every day | ORAL | 3 refills | Status: AC

## 2023-05-26 DIAGNOSIS — M069 Rheumatoid arthritis, unspecified: Secondary | ICD-10-CM | POA: Diagnosis not present

## 2023-05-26 DIAGNOSIS — G894 Chronic pain syndrome: Secondary | ICD-10-CM | POA: Diagnosis not present

## 2023-05-26 DIAGNOSIS — M47812 Spondylosis without myelopathy or radiculopathy, cervical region: Secondary | ICD-10-CM | POA: Diagnosis not present

## 2023-05-26 DIAGNOSIS — M47817 Spondylosis without myelopathy or radiculopathy, lumbosacral region: Secondary | ICD-10-CM | POA: Diagnosis not present

## 2023-06-12 ENCOUNTER — Other Ambulatory Visit: Payer: Self-pay | Admitting: Medical-Surgical

## 2023-06-13 ENCOUNTER — Other Ambulatory Visit: Payer: Self-pay | Admitting: Medical-Surgical

## 2023-06-13 DIAGNOSIS — G894 Chronic pain syndrome: Secondary | ICD-10-CM

## 2023-06-23 DIAGNOSIS — M069 Rheumatoid arthritis, unspecified: Secondary | ICD-10-CM | POA: Diagnosis not present

## 2023-06-23 DIAGNOSIS — M47812 Spondylosis without myelopathy or radiculopathy, cervical region: Secondary | ICD-10-CM | POA: Diagnosis not present

## 2023-06-23 DIAGNOSIS — G894 Chronic pain syndrome: Secondary | ICD-10-CM | POA: Diagnosis not present

## 2023-06-23 DIAGNOSIS — M47817 Spondylosis without myelopathy or radiculopathy, lumbosacral region: Secondary | ICD-10-CM | POA: Diagnosis not present

## 2023-07-02 NOTE — Progress Notes (Signed)
 07/05/2023 [name] [id number] 1970/09/18  Referring provider: Cherre Cornish, NP Primary GI doctor: Dr. Karene Oto  ASSESSMENT AND PLAN:  Family history of colon cancer Patient was seen 03/26/2022 for by Dr. Karene Oto plan for screening colonoscopy after 08/12/2022 to LAD for 1 year post STEMI and stent, not done 05/01/2022 PET scan done for abnormal lung nodule unremarkable for hypermetabolic activity within the liver pancreas spleen abdomen or pelvis, showed scarring and no hypermetabolic activity - schedule colonoscopy in August for screening with history of MI  08/12/2022, will request permission to hold Brilinta  We have discussed the risks of bleeding, infection, perforation, medication reactions, and remote risk of death associated with colonoscopy. All questions were answered and the patient acknowledges these risk and wishes to proceed.  GERD for years No dysphagia, no melena Well controlled omeprazole  Smoking cessation discussed Will call back if any issues  CAD with STEMI in 07/2021  S/p PCI with DES to LAD x 1, DES to RCA x 1   on ASA/Brilinta .   Follows in the Cardiology Clinic, Dr. Jacquelynn Matter  No chest pain, no SOB Hold Brilinta  for 5 days before procedure will instruct when and how to resume after procedure.  Patient understands that there is a low but real risk of cardiovascular event such as heart attack, stroke, or embolism /  thrombosis, or ischemia while off Brilinta . The patient consents to proceed.  Will communicate by phone or EMR with patient's prescribing provider to confirm that holding Brilinta  is reasonable in this case.   Diabetes On Ozempic , recent increase to 1 mg Discussed GLP1 with the patient, mechanism of action. Gastroparesis diet given to the patient.  Patient should be instructed to hold this medications if dose falls within 7 days of endoscopic procedure, due to increased risk of retained gastric contents.  Chronic pain syndrome On  oxycodone  daily Denies constipation, has BM every morning, formed, brown  OSA On CPAP  Diffuse hepatic steatosis Seen on PET scan 05/01/2022    Latest Ref Rng & Units 05/21/2023    9:23 AM 11/04/2021   10:35 AM 08/08/2021   11:39 AM  Hepatic Function  Total Protein 6.0 - 8.5 g/dL 7.4  7.3  6.8   Albumin 3.8 - 4.9 g/dL 4.4  4.5  3.6   AST 0 - 40 IU/L 25  37  41   ALT 0 - 44 IU/L 24  44  40   Alk Phosphatase 44 - 121 IU/L 67  76  46   Total Bilirubin 0.0 - 1.2 mg/dL 0.5  0.5  0.8    Platelets 329  - need LFTs and CBC monitored every 6 months, - evaluation with imaging every 2-3 years.  -Continue to work on risk factor modification including diet exercise and control of risk factors including blood sugars.   Patient Care Team: Cherre Cornish, NP as PCP - General (Nurse Practitioner) Lucendia Rusk, MD as PCP - Cardiology (Cardiology) syeed, tauseet MD (Rheumatology)  HISTORY OF PRESENT ILLNESS: 53 y.o. male with a past medical history listed below presents for evaluation of colonoscopy after STEMI 07/2022 on Brilinta .   Discussed the use of AI scribe software for clinical note transcription with the patient, who gave verbal consent to proceed.  History of Present Illness   [name] is a 53 year old male who presents for a screening colonoscopy.  He is scheduled for a screening colonoscopy, initially planned after his myocardial infarction and stent placement in July 2024.  He is currently on Brilinta  for secondary prevention of heart disease and follows up with a cardiologist for cardiac care.  He is on Ozempic , recently increased to 1 mg, for diabetes management and secondary heart prevention. He experiences heartburn, which he attributes to the medication, and manages it with omeprazole  40 mg once daily. No difficulty swallowing or dark stools. Regular bowel movements every morning without constipation, and stools are normal in consistency.  He has a history of sleep  apnea and uses a CPAP machine. A recent PET scan showed lung scarring but no hypermetabolic activity, and no concerning findings in the liver, pancreas, spleen, or abdomen.  He continues to smoke, although he has reduced his consumption from three packs a day to one pack every two days. He has a history of using Chantix  for smoking cessation but discontinued it due to adverse effects. He is currently experiencing stress related to a divorce but reports feeling better since reducing stress and increasing physical activity, including daily walking and weight loss.  No chest pain, shortness of breath, constipation, difficulty swallowing, or dark stools. Reports heartburn and regular bowel movements.        He  reports that he has been smoking cigarettes. He has a 30 pack-year smoking history. He quit smokeless tobacco use about 37 years ago.  His smokeless tobacco use included chew. He reports that he does not drink alcohol and does not use drugs.  RELEVANT GI HISTORY, IMAGING AND LABS: Results   LABS Liver function: normal  RADIOLOGY PET scan: Lung scarring, no hypermetabolic activity, no activity in liver, pancreas, spleen, abdomen (04/2023)      CBC    Component Value Date/Time   WBC 8.8 05/21/2023 0923   WBC 13.5 (H) 11/16/2022 1957   RBC 5.27 05/21/2023 0923   RBC 5.04 11/16/2022 1957   HGB 16.0 05/21/2023 0923   HCT 47.3 05/21/2023 0923   PLT 329 05/21/2023 0923   MCV 90 05/21/2023 0923   MCH 30.4 05/21/2023 0923   MCH 29.4 11/16/2022 1957   MCHC 33.8 05/21/2023 0923   MCHC 33.9 11/16/2022 1957   RDW 12.9 05/21/2023 0923   LYMPHSABS 1.9 05/21/2023 0923   MONOABS 0.8 11/24/2017 1117   EOSABS 0.1 05/21/2023 0923   BASOSABS 0.1 05/21/2023 0923   Recent Labs    11/16/22 1957 05/21/23 0923  HGB 14.8 16.0    CMP     Component Value Date/Time   NA 136 05/21/2023 0923   K 4.5 05/21/2023 0923   CL 100 05/21/2023 0923   CO2 21 05/21/2023 0923   GLUCOSE 74 05/21/2023  0923   GLUCOSE 113 (H) 11/16/2022 1957   BUN 10 05/21/2023 0923   CREATININE 0.93 05/21/2023 0923   CREATININE 1.03 04/03/2021 0000   CALCIUM  9.5 05/21/2023 0923   CALCIUM  10.6 02/09/2012 0000   PROT 7.4 05/21/2023 0923   ALBUMIN 4.4 05/21/2023 0923   AST 25 05/21/2023 0923   ALT 24 05/21/2023 0923   ALKPHOS 67 05/21/2023 0923   BILITOT 0.5 05/21/2023 0923   GFRNONAA >60 11/16/2022 1957   GFRNONAA 103 07/12/2019 1136   GFRAA 120 07/12/2019 1136      Latest Ref Rng & Units 05/21/2023    9:23 AM 11/04/2021   10:35 AM 08/08/2021   11:39 AM  Hepatic Function  Total Protein 6.0 - 8.5 g/dL 7.4  7.3  6.8   Albumin 3.8 - 4.9 g/dL 4.4  4.5  3.6   AST 0 - 40 IU/L  25  37  41   ALT 0 - 44 IU/L 24  44  40   Alk Phosphatase 44 - 121 IU/L 67  76  46   Total Bilirubin 0.0 - 1.2 mg/dL 0.5  0.5  0.8       Current Medications:   Current Outpatient Medications (Endocrine & Metabolic):    metFORMIN  (GLUCOPHAGE ) 500 MG tablet, Take 1 tablet (500 mg total) by mouth 2 (two) times daily with a meal.   Semaglutide , 1 MG/DOSE, 4 MG/3ML SOPN, Inject 1 mg as directed once a week.  Current Outpatient Medications (Cardiovascular):    atorvastatin  (LIPITOR ) 80 MG tablet, Take 1 tablet (80 mg total) by mouth daily.   ezetimibe  (ZETIA ) 10 MG tablet, Take 1 tablet (10 mg total) by mouth daily.   metoprolol  tartrate (LOPRESSOR ) 25 MG tablet, TAKE 1/2 TABLET (12.5MG ) BY MOUTH TWICE DAILY - PLEASE MAKE APPOINTMENT WITH DOCTOR   nitroGLYCERIN  (NITROSTAT ) 0.4 MG SL tablet, Place 1 tablet (0.4 mg total) under the tongue every 5 (five) minutes as needed.   valsartan -hydrochlorothiazide  (DIOVAN -HCT) 160-25 MG tablet, TAKE 1 TABLET BY MOUTH EVERY DAY  Current Outpatient Medications (Respiratory):    albuterol  (VENTOLIN  HFA) 108 (90 Base) MCG/ACT inhaler, Inhale 2 puffs into the lungs every 6 (six) hours as needed for wheezing.   fluticasone  furoate-vilanterol (BREO ELLIPTA ) 200-25 MCG/ACT AEPB, Inhale 1 puff into  the lungs daily.   loratadine  (CLARITIN ) 10 MG tablet, Take 1 tablet (10 mg total) by mouth daily.   promethazine  (PHENERGAN ) 25 MG tablet, Take 1 tablet (25 mg total) by mouth every 6 (six) hours as needed for nausea or vomiting.  Current Outpatient Medications (Analgesics):    aspirin  81 MG chewable tablet, Chew 1 tablet (81 mg total) by mouth daily.   oxyCODONE -acetaminophen  (PERCOCET) 7.5-325 MG tablet, Take 1 tablet by mouth 3 (three) times daily as needed.  Current Outpatient Medications (Hematological):    ticagrelor  (BRILINTA ) 90 MG TABS tablet, Take 1 tablet (90 mg total) by mouth 2 (two) times daily. Pt needs to make appt with provider for further refills  Current Outpatient Medications (Other):    cyclobenzaprine  (FLEXERIL ) 10 MG tablet, TAKE 1 TABLET BY MOUTH THREE TIMES A DAY AS NEEDED FOR MUSCLE SPASMS   escitalopram  (LEXAPRO ) 10 MG tablet, Take 1 tablet (10 mg total) by mouth daily.   lidocaine  (LIDODERM ) 5 %, Place 1 patch onto the skin every 12 (twelve) hours. Remove & Discard patch within 12 hours or as directed by MD   Na Sulfate-K Sulfate-Mg Sulfate concentrate (SUPREP BOWEL PREP KIT) 17.5-3.13-1.6 GM/177ML SOLN, Take 1 kit (354 mLs total) by mouth as directed.   omeprazole  (PRILOSEC) 40 MG capsule, TAKE 1 CAPSULE (40 MG TOTAL) BY MOUTH DAILY.   pregabalin  (LYRICA ) 100 MG capsule, Take 100 mg by mouth 2 (two) times daily.  Medical History:  Past Medical History:  Diagnosis Date   Acute ST elevation myocardial infarction (STEMI) due to occlusion of left anterior descending (LAD) coronary artery (HCC) 08/08/2021   mLAD 100% -> DES PCI Synergy DES 2.75 mm x 16 (3.25 mm); => mRCA90% -> staged PCI.    Anxiety    Arthritis    Asthma    history UE:[id number]   Coronary artery disease involving native coronary artery of native heart with unstable angina pectoris (HCC) 08/08/2021   Ant STEMI - Culprit 100% mLAD (DES PCI); mRCA 90% (Staged PCI 7/17), OM1 50%.   Depression     Diabetes mellitus (HCC) 11/19/2021  Essential hypertension 12/11/2011   Gout    Headache    History of hiatal hernia    History of lithotripsy    Hypertension    Kidney stones    Lumbar degenerative disc disease 12/11/2011   Moderate obstructive sleep apnea 04/21/2021   Nephrolithiasis 12/11/2011   Pericarditis    Renal tubular acidosis    Rheumatoid arthritis(714.0)    Allergies:  Allergies  Allergen Reactions   Lisinopril  Cough   Benadryl [Diphenhydramine Hcl] Anxiety     Surgical History:  He  has a past surgical history that includes Hemorroidectomy; Kidney stone surgery; Anterior cervical decomp/discectomy fusion; Carpal tunnel release (Left, 11/06/2015); Wrist fusion (Left, 11/06/2015); Anterior cervical decomp/discectomy fusion (N/A, 11/25/2017); LEFT HEART CATH AND CORONARY ANGIOGRAPHY (N/A, 08/08/2021); Coronary/Graft Acute MI Revascularization (N/A, 08/08/2021); Coronary Ultrasound/IVUS (N/A, 08/08/2021); CORONARY STENT INTERVENTION (N/A, 08/11/2021); LEFT HEART CATH AND CORONARY ANGIOGRAPHY (N/A, 08/11/2021); and carpel tunnel surgery (Right, 04/23/2022). Family History:  His family history includes Alcohol abuse in his father; Cancer - Colon in his mother; Colon cancer in his father; Coronary artery disease in his brother, father, and mother; Diabetes Mellitus II in his father and mother; Heart attack in his brother; Heart attack (age of onset: 38) in his mother; Heart attack (age of onset: 73) in his father; Heart murmur in his sister; Hypertension in his father; Ovarian cancer in his mother; Uterine cancer in his mother.  REVIEW OF SYSTEMS  : All other systems reviewed and negative except where noted in the History of Present Illness.  PHYSICAL EXAM: BP 128/82   Pulse 84   Ht 5\' 9"  (1.753 m)   Wt 224 lb 9.6 oz (101.9 kg)   BMI 33.17 kg/m  Physical Exam   GENERAL APPEARANCE: Well nourished, in no apparent distress. HEENT: No cervical lymphadenopathy, unremarkable  thyroid , sclerae anicteric, conjunctiva pink. RESPIRATORY: Respiratory effort normal, breath sounds equal bilaterally without rales, rhonchi, or wheezing. Lungs clear to auscultation bilaterally. CARDIO: Regular rate and rhythm with no murmurs, rubs, or gallops, peripheral pulses intact. ABDOMEN: Soft, non-distended, active bowel sounds in all four quadrants, no tenderness to palpation, no rebound, no mass appreciated. RECTAL: Declines. MUSCULOSKELETAL: Full range of motion, normal gait, without edema. SKIN: Dry, intact without rashes or lesions. No jaundice. NEURO: Alert, oriented, no focal deficits. PSYCH: Cooperative, normal mood and affect.      Edmonia Gottron, PA-C 9:32 AM

## 2023-07-05 ENCOUNTER — Encounter: Payer: Self-pay | Admitting: Physician Assistant

## 2023-07-05 ENCOUNTER — Telehealth: Payer: Self-pay

## 2023-07-05 ENCOUNTER — Ambulatory Visit: Admitting: Physician Assistant

## 2023-07-05 VITALS — BP 128/82 | HR 84 | Ht 69.0 in | Wt 224.6 lb

## 2023-07-05 DIAGNOSIS — G8929 Other chronic pain: Secondary | ICD-10-CM | POA: Diagnosis not present

## 2023-07-05 DIAGNOSIS — Z1211 Encounter for screening for malignant neoplasm of colon: Secondary | ICD-10-CM

## 2023-07-05 DIAGNOSIS — E1169 Type 2 diabetes mellitus with other specified complication: Secondary | ICD-10-CM

## 2023-07-05 DIAGNOSIS — Z800 Family history of malignant neoplasm of digestive organs: Secondary | ICD-10-CM | POA: Diagnosis not present

## 2023-07-05 DIAGNOSIS — K219 Gastro-esophageal reflux disease without esophagitis: Secondary | ICD-10-CM | POA: Diagnosis not present

## 2023-07-05 DIAGNOSIS — Z7985 Long-term (current) use of injectable non-insulin antidiabetic drugs: Secondary | ICD-10-CM | POA: Diagnosis not present

## 2023-07-05 DIAGNOSIS — E119 Type 2 diabetes mellitus without complications: Secondary | ICD-10-CM

## 2023-07-05 DIAGNOSIS — K760 Fatty (change of) liver, not elsewhere classified: Secondary | ICD-10-CM

## 2023-07-05 DIAGNOSIS — F17210 Nicotine dependence, cigarettes, uncomplicated: Secondary | ICD-10-CM

## 2023-07-05 DIAGNOSIS — I25110 Atherosclerotic heart disease of native coronary artery with unstable angina pectoris: Secondary | ICD-10-CM

## 2023-07-05 MED ORDER — NA SULFATE-K SULFATE-MG SULF 17.5-3.13-1.6 GM/177ML PO SOLN
1.0000 | ORAL | 0 refills | Status: AC
Start: 2023-07-05 — End: ?

## 2023-07-05 NOTE — Telephone Encounter (Signed)
   Name: [name]  DOB: 02/17/1970  MRN: [id number]  Primary Cardiologist: Avery Bodo, MD  Chart reviewed as part of pre-operative protocol coverage. Because of [name]'s past medical history and time since last visit, he will require a follow-up in-office visit in order to better assess preoperative cardiovascular risk.  Pre-op covering staff: - Please schedule appointment and call patient to inform them. If patient already had an upcoming appointment within acceptable timeframe, please add "pre-op clearance" to the appointment notes so provider is aware. - Please contact requesting surgeon's office via preferred method (i.e, phone, fax) to inform them of need for appointment prior to surgery.  Has not been seen in office since 10/2021. Unsure if he is still taking Brilinta .   He may hold Brilinta  for 5 days prior to procedure. Please resume Brilinta  as soon as possible postprocedure, at the discretion of the surgeon.    Regarding ASA therapy, we recommend continuation of ASA throughout the perioperative period.    Ava Boatman, NP  07/05/2023, 11:34 AM

## 2023-07-05 NOTE — Telephone Encounter (Signed)
 Tried to call the pt to schedule in office appt, though VM is full could not leave message to call back. I will reach out to requesting office if they s/w the pt to let him know he needs to call for appt in the office for preop clearance.

## 2023-07-05 NOTE — Telephone Encounter (Signed)
 [facility] Pre-operative Risk Assessment     Request for surgical clearance:     Endoscopy Procedure  What type of surgery is being performed?     Colonoscopy  When is this surgery scheduled?     08-26-23  What type of clearance is required ?   Pharmacy  Are there any medications that need to be held prior to surgery and how long? Brilinta  x 5 days  Practice name and name of physician performing surgery?      [facility]  What is your office phone and fax number?      Phone- [phone]  Fax- [phone]  Anesthesia type (None, local, MAC, general) ?       MAC   Please route your response to Angelene Barbone, CMA

## 2023-07-05 NOTE — Patient Instructions (Addendum)
 _______________________________________________________  If your blood pressure at your visit was 140/90 or greater, please contact your primary care physician to follow up on this.  If you are age 54 or younger, your body mass index should be between 19-25. Your Body mass index is 33.17 kg/m. If this is out of the aformentioned range listed, please consider follow up with your Primary Care Provider.  ________________________________________________________  The [facility] providers would like to encourage you to use MYCHART to communicate with providers for non-urgent requests or questions.  Due to long hold times on the telephone, sending your provider a message by [facility] may be a faster and more efficient way to get a response.  Please allow 48 business hours for a response.  Please remember that this is for non-urgent requests.  _______________________________________________________  ORAL DIABETIC MEDICATION INSTRUCTIONS (Actos, Alogliptin, Farxiga, Metformin , Glipizide, Glimepiride, Invokana,  Jardiance, Janumet, Januvia, Rybelsus , Xigduo, Sitagliptin, Synjardy & Tradjenta)   The day before your procedure:  Take your diabetic pill as you do normally  The day of your procedure:  Do not take your diabetic pill   We will check your blood sugar levels during the admission process and again in Recovery before discharging you home ____________________________________________________________________________  ONCE A WEEK INJECTIONS Trulicity, Ozempic ,  Mounjaro, Wegovy , Tanzeum, Byetta, Victoza, Bydureon, Symlin Pen & Zepbound -  DO NOT TAKE 7 days prior to the procedure.  Last dose 08-19-23 _____________________________________________________________________________  [name] have been scheduled for a colonoscopy. Please follow written instructions given to you at your visit today.   If you use inhalers (even only as needed), please bring them with you on the day of your procedure.  DO  NOT TAKE 7 DAYS PRIOR TO TEST- Trulicity (dulaglutide) Ozempic , Wegovy  (semaglutide ) Mounjaro (tirzepatide) Bydureon Bcise (exanatide extended release)  DO NOT TAKE 1 DAY PRIOR TO YOUR TEST Rybelsus  (semaglutide ) Adlyxin (lixisenatide) Victoza (liraglutide) Byetta (exanatide) ___________________________________________________________________________  Due to recent changes in healthcare laws, you may see the results of your imaging and laboratory studies on MyChart before your provider has had a chance to review them.  We understand that in some cases there may be results that are confusing or concerning to you. Not all laboratory results come back in the same time frame and the provider may be waiting for multiple results in order to interpret others.  Please give us  48 hours in order for your provider to thoroughly review all the results before contacting the office for clarification of your results.   Please take your proton pump inhibitor medication, omeprazole  40 mg  Please take this medication 30 minutes to 1 hour before meals- this makes it more effective.  Avoid spicy and acidic foods Avoid fatty foods Limit your intake of coffee, tea, alcohol, and carbonated drinks Work to maintain a healthy weight Keep the head of the bed elevated at least 3 inches with blocks or a wedge pillow if you are having any nighttime symptoms Stay upright for 2 hours after eating Avoid meals and snacks three to four hours before bedtime Stop smoking  Metabolic dysfunction associated seatohepatitis  Now the leading cause of liver failure in the [place] .  It is normally from such risk factors as obesity, diabetes, insulin resistance, high cholesterol, or metabolic syndrome.  The only definitive therapy is weight loss and exercise.   Suggest walking 20-30 mins daily.  Decreasing carbohydrates, increasing veggies.    Fatty Liver Fatty liver is the accumulation of fat in liver cells. It is also  called hepatosteatosis  or steatohepatitis. It is normal for your liver to contain some fat. If fat is more than 5 to 10% of your liver's weight, you have fatty liver.  There are often no symptoms (problems) for years while damage is still occurring. People often learn about their fatty liver when they have medical tests for other reasons. Fat can damage your liver for years or even decades without causing problems. When it becomes severe, it can cause fatigue, weight loss, weakness, and confusion. This makes you more likely to develop more serious liver problems. The liver is the largest organ in the body. It does a lot of work and often gives no warning signs when it is sick until late in a disease. The liver has many important jobs including: Breaking down foods. Storing vitamins, iron, and other minerals. Making proteins. Making bile for food digestion. Breaking down many products including medications, alcohol and some poisons.  PROGNOSIS  Fatty liver may cause no damage or it can lead to an inflammation of the liver. This is, called steatohepatitis.  Over time the liver may become scarred and hardened. This condition is called cirrhosis. Cirrhosis is serious and may lead to liver failure or cancer. NASH is one of the leading causes of cirrhosis. About 10-20% of Americans have fatty liver and a smaller 2-5% has NASH.  TREATMENT  Weight loss, fat restriction, and exercise in overweight patients produces inconsistent results but is worth trying. Good control of diabetes may reduce fatty liver. Eat a balanced, healthy diet. Increase your physical activity. There are no medical or surgical treatments for a fatty liver or NASH, but improving your diet and increasing your exercise may help prevent or reverse some of the damage.

## 2023-07-06 NOTE — Telephone Encounter (Signed)
 2nd attempt to reach pt regarding surgical clearance and the need for an TELE appointment.  Unable to LVM as mailbox is full

## 2023-07-08 NOTE — Telephone Encounter (Signed)
 3rd attempt to reach the pt to schedule appt in office per preop APP (not tele preop appt) as noted by CMA Ms. Collins. Pt will need in office appt for preop clearance.    I will update the requesting office the pt needs to call to schedule appt in office for preop clearance. I will remove from the preop call back pool at this time.

## 2023-07-09 NOTE — Telephone Encounter (Signed)
 Patient advised that he has been given clearance to hold Brilinta  5 days prior to colonoscopy scheduled for 08-26-23.  Patient advised to take last dose of Brilinta  on 08-20-23, and he will be advised when to restart Brilinta  by Dr Karene Oto after the procedure.  Patient agreed to plan and verbalized understanding.  No further questions.   Patient advised that he should contact [facility] to schedule an appointment with Dr Jacquelynn Matter or one of his APPs as he has not been seen in their office since 2023.  Patient agreed to contact that office and getting an appointment scheduled.

## 2023-07-18 ENCOUNTER — Other Ambulatory Visit: Payer: Self-pay | Admitting: Medical-Surgical

## 2023-07-18 DIAGNOSIS — G894 Chronic pain syndrome: Secondary | ICD-10-CM

## 2023-07-19 NOTE — Telephone Encounter (Signed)
 Please advise on refill request

## 2023-07-21 DIAGNOSIS — M47817 Spondylosis without myelopathy or radiculopathy, lumbosacral region: Secondary | ICD-10-CM | POA: Diagnosis not present

## 2023-07-21 DIAGNOSIS — M069 Rheumatoid arthritis, unspecified: Secondary | ICD-10-CM | POA: Diagnosis not present

## 2023-07-21 DIAGNOSIS — M47812 Spondylosis without myelopathy or radiculopathy, cervical region: Secondary | ICD-10-CM | POA: Diagnosis not present

## 2023-07-21 DIAGNOSIS — G894 Chronic pain syndrome: Secondary | ICD-10-CM | POA: Diagnosis not present

## 2023-08-02 ENCOUNTER — Ambulatory Visit: Attending: Cardiology | Admitting: Cardiology

## 2023-08-02 ENCOUNTER — Other Ambulatory Visit: Payer: Self-pay

## 2023-08-02 ENCOUNTER — Other Ambulatory Visit (HOSPITAL_COMMUNITY): Payer: Self-pay

## 2023-08-02 ENCOUNTER — Encounter: Payer: Self-pay | Admitting: Cardiology

## 2023-08-02 VITALS — BP 138/90 | HR 67 | Ht 67.0 in | Wt 233.0 lb

## 2023-08-02 DIAGNOSIS — I25118 Atherosclerotic heart disease of native coronary artery with other forms of angina pectoris: Secondary | ICD-10-CM

## 2023-08-02 DIAGNOSIS — E782 Mixed hyperlipidemia: Secondary | ICD-10-CM | POA: Diagnosis not present

## 2023-08-02 DIAGNOSIS — F17210 Nicotine dependence, cigarettes, uncomplicated: Secondary | ICD-10-CM | POA: Diagnosis not present

## 2023-08-02 DIAGNOSIS — I10 Essential (primary) hypertension: Secondary | ICD-10-CM | POA: Diagnosis not present

## 2023-08-02 DIAGNOSIS — E119 Type 2 diabetes mellitus without complications: Secondary | ICD-10-CM

## 2023-08-02 MED ORDER — AMLODIPINE BESYLATE 5 MG PO TABS
5.0000 mg | ORAL_TABLET | Freq: Every day | ORAL | 3 refills | Status: AC
Start: 2023-08-02 — End: ?
  Filled 2023-08-02: qty 90, 90d supply, fill #0

## 2023-08-02 NOTE — Patient Instructions (Addendum)
 Medication Instructions:  START Amlodipine  5 mg daily *If you need a refill on your cardiac medications before your next appointment, please call your pharmacy*  Testing/Procedures : ECHOCARDIOGRAM NEEDED BY JULY 24th Your physician has requested that you have an echocardiogram. Echocardiography is a painless test that uses sound waves to create images of your heart. It provides your doctor with information about the size and shape of your heart and how well your heart's chambers and valves are working. This procedure takes approximately one hour. There are no restrictions for this procedure. Please do NOT wear cologne, perfume, aftershave, or lotions (deodorant is allowed). Please arrive 15 minutes prior to your appointment time.  Please note: We ask at that you not bring children with you during ultrasound (echo/ vascular) testing. Due to room size and safety concerns, children are not allowed in the ultrasound rooms during exams. Our front office staff cannot provide observation of children in our lobby area while testing is being conducted. An adult accompanying a patient to their appointment will only be allowed in the ultrasound room at the discretion of the ultrasound technician under special circumstances. We apologize for any inconvenience.   Exercise Stress Test NEEDED BY JULY 24th Your physician has requested that you have en exercise stress myoview. For further information please visit [email or web address] Please follow instruction sheet, as given.   PHARM D REFERRAL NEEDED BY JULY 24th  Follow-Up: At [facility], you and your health needs are our priority.  As part of our continuing mission to provide you with exceptional heart care, our providers are all part of one team.  This team includes your primary Cardiologist (physician) and Advanced Practice Providers or APPs (Physician Assistants and Nurse Practitioners) who all work together to provide you with the care you need,  when you need it.  Your next appointment:   July 24th, 2025  Provider:   Artist Pouch, PA-C  We recommend signing up for the patient portal called MyChart.  Sign up information is provided on this After Visit Summary.  MyChart is used to connect with patients for Virtual Visits (Telemedicine).  Patients are able to view lab/test results, encounter notes, upcoming appointments, etc.  Non-urgent messages can be sent to your provider as well.   To learn more about what you can do with MyChart, go to [email or web address].

## 2023-08-02 NOTE — Progress Notes (Signed)
 Cardiology Office Note:  .   Date:  08/02/2023  ID:  [name], DOB 1970-05-27, MRN [id number] PCP: Willo Mini, NP  [facility] Providers Cardiologist:  Newman Lawrence, MD PCP: Willo Mini, NP  Chief Complaint  Patient presents with   Coronary Artery Disease   Hypertension   Hyperlipidemia     [name] is a 53 y.o. male with hypertension, hyperlipidemia, type 2 DM, nicotine  dependence, OSA on CPAP, CAD  Discussed the use of AI scribe software for clinical note transcription with the patient, who gave verbal consent to proceed.  History of Present Illness   Patient was last seen by Dr. Dann in 10/2021.  He had anterior STEMI in 07/2021, underwent culprit vessel PCI to mid LAD, as well as staged prolonged culprit vessel PCI to mid RCA.  Has not been seen in cardiology clinic since then.  He is compliant with his medical therapy.  Blood pressure remains elevated.  But he is cut down his smoking from 3 packs/day when he had his MI in 2023 down to half pack a day, he continues to smoke.  Diabetes is very well-controlled.  He uses CPAP regularly for OSA.  He walks 1 to regularly as needed without any exertional chest pain symptoms.  However, he has had recent episodes of chest pain with radiation to his left arm, lasting for 35 to 40 minutes.  He has found similar to his heart attack.  Vitals:   08/02/23 0805  BP: (!) 138/90  Pulse: 67  SpO2: 95%      Review of Systems  Cardiovascular:  Positive for chest pain. Negative for dyspnea on exertion, leg swelling, palpitations and syncope.        Studies Reviewed: SABRA        EKG 08/02/2023: Normal sinus rhythm Normal ECG When compared with ECG of 16-Nov-2022 19:23, Non-specific change in ST segment in Lateral leads T wave inversion no longer evident in Inferior leads    Labs 04/2023: Chol 212, TG 99, HDL 31, LDL 163 HbA1C 6.2% Hb 16 Cr 0.93  Echocardiogram 2023:  1. Left ventricular ejection  fraction, by estimation, is 65 to 70%. The  left ventricle has normal function. The left ventricle has no regional  wall motion abnormalities. Left ventricular diastolic parameters were  normal.   2. Right ventricular systolic function is normal. The right ventricular  size is normal.   3. The mitral valve is normal in structure. No evidence of mitral valve  regurgitation. No evidence of mitral stenosis.   4. The aortic valve is grossly normal. Aortic valve regurgitation is not  visualized. No aortic stenosis is present.   Comparison(s): Prior images reviewed side by side. Changes from prior  study are noted. EF slightly higher than prior (2019).   Conclusion(s)/Recommendation(s): Normal biventricular function without  evidence of hemodynamically significant valvular heart disease.   Coronary angiography intervention 2023:   Mid RCA lesion is 90% stenosed.   1st Mrg lesion is 50% stenosed.   Mid LAD lesion is 100% stenosed.  Culprit lesion for anterior STEMI.   A drug-eluting stent was successfully placed using a SYNERGY XD 2.75X16, postdilated to 3.25 mm and optimized with intravascular ultrasound..   Post intervention, there is a 0% residual stenosis.   Mild, diffuse coronary disease.   Successful PCI of an occluded LAD causing anterior ST elevation MI.  He will need to be watched in the ICU.  Dual antiplatelet therapy with aspirin  and Brilinta .  High-dose atorvastatin .  We will check lipids and A1c.  Plan for PCI of the RCA on Monday.     LAD stent was patent.   1st Mrg lesion is 50% stenosed.   Mid RCA lesion is 90% stenosed.  A drug-eluting stent was successfully placed using a SYNERGY XD 2.50X24, postdilated to greater than 3 mm in diameter.   Post intervention, there is a 0% residual stenosis.   LV end diastolic pressure is normal.   There is no aortic valve stenosis.   Successful PCI of the mid RCA.  He will need aggressive secondary prevention with aspirin , Brilinta ,  high-dose statin, beta-blocker.  He was already on an ARB.  He will need regular exercise along with healthy diet.   Physical Exam Vitals and nursing note reviewed.  Constitutional:      General: He is not in acute distress. Neck:     Vascular: No JVD.  Cardiovascular:     Rate and Rhythm: Normal rate and regular rhythm.     Heart sounds: Normal heart sounds. No murmur heard. Pulmonary:     Effort: Pulmonary effort is normal.     Breath sounds: Normal breath sounds. No wheezing or rales.  Musculoskeletal:     Right lower leg: No edema.     Left lower leg: No edema.      VISIT DIAGNOSES:   ICD-10-CM   1. Essential hypertension  I10 EKG 12-Lead    2. Coronary artery disease of native artery of native heart with stable angina pectoris (HCC)  I25.118 EKG 12-Lead    ECHOCARDIOGRAM COMPLETE    MYOCARDIAL PERFUSION IMAGING    AMB Referral to [facility] Pharm[facility]    Cardiac Stress Test: Informed Consent Details: Physician/Practitioner Attestation; Transcribe to consent form and obtain patient signature    3. Mixed hyperlipidemia  E78.2 EKG 12-Lead    AMB Referral to [facility]    4. Cigarette nicotine  dependence without complication  F17.210     5. Type 2 diabetes mellitus without complication, without long-term current use of insulin (HCC)  E11.9        [name] is a 53 y.o. male with hypertension, hyperlipidemia, type 2 DM, nicotine  dependence, CAD Assessment & Plan  CAD: Culprit (LAD) and nonculprit (RCA) PCI during STEMI in 2023. He is still on DAPT with aspirin  and Brilinta  2 years out of his MI.  While he does not need DAPT at this time, I would await discontinuation of Brilinta  until after exercise nuclear stress test was performed given his recent chest pain with radiation to left arm.  If stress test is monitoring any new ischemia, he can discontinue Brilinta  and continue aspirin  monotherapy thereafter.  This will also be important to decide before his  upcoming screening colonoscopy scheduled on 08/26/2023 with [facility] gastroenterology. Blood pressure elevated today.  Started amlodipine  5 mg daily.  Blood pressure is better controlled, he can potentially come off metoprolol , currently taking tartrate 12.5 mg twice daily. Lipids remain extremely elevated.  LDL is 162 on Lipitor  80 mg daily, Zetia  10 mg daily.  He has strong family history of CAD, will likely have familial hypercholesterolemia. Referral to lipid clinic for consideration of injectable agents, in addition to dietary changes and increased physical activity.  Recommended reducing red meat intake.  Hypertension: Uncontrolled. Repeat, as above.  Mixed hyperlipidemia: Uncontrolled.  Recommendations as above.  OSA: Continue CPAP  Type 2 diabetes mellitus: Well-controlled  Nicotine  dependence: Tobacco cessation counseling: - Currently smoking 1/2 packs/day -  Patient was informed of the dangers of tobacco abuse including stroke, cancer, and MI, as well as benefits of tobacco cessation. - Patient is willing to quit at this time. - Approximately 5 mins were spent counseling patient cessation techniques. We discussed various methods to help quit smoking, including deciding on a date to quit, joining a support group, pharmacological agents. Patient attributes his smoking to stress.  Has tried several different medications, but has not tolerated, including nicotine  patch/gum/Chantix /bupropion.  Patient would like to quit on his own.  - I will reassess his progress at the next follow-up visit.    Informed Consent   Shared Decision Making/Informed Consent The risks [chest pain, shortness of breath, cardiac arrhythmias, dizziness, blood pressure fluctuations, myocardial infarction, stroke/transient ischemic attack, nausea, vomiting, allergic reaction, radiation exposure, metallic taste sensation and life-threatening complications (estimated to be 1 in 10,000)], benefits (risk  stratification, diagnosing coronary artery disease, treatment guidance) and alternatives of a nuclear stress test were discussed in detail with Mr. Walen and he agrees to proceed.       Meds ordered this encounter  Medications   amLODipine  (NORVASC ) 5 MG tablet    Sig: Take 1 tablet (5 mg total) by mouth daily.    Dispense:  90 tablet    Refill:  3     F/u in 3 weeks, after echocardiogram and stress test and to reassess blood pressure..  At that visit, if stress test shows no new ischemia, his upcoming screening colonoscopy is low risk.  Brilinta  can be discontinued at next visit, provide no severe ischemia noted.  Continue aspirin  81 mg daily.  If possible, continue aspirin  perioperatively.  If that is not possible, limit aspirin  interruption to no more than 3 to 5 days.   Signed, Newman JINNY Lawrence, MD

## 2023-08-03 ENCOUNTER — Telehealth (HOSPITAL_COMMUNITY): Payer: Self-pay | Admitting: *Deleted

## 2023-08-03 ENCOUNTER — Encounter (HOSPITAL_COMMUNITY): Payer: Self-pay | Admitting: *Deleted

## 2023-08-03 ENCOUNTER — Other Ambulatory Visit: Payer: Self-pay | Admitting: Cardiology

## 2023-08-03 DIAGNOSIS — I25118 Atherosclerotic heart disease of native coronary artery with other forms of angina pectoris: Secondary | ICD-10-CM

## 2023-08-03 NOTE — Telephone Encounter (Signed)
 Attempted to call patient regarding upcoming appointment. No answer, unable to leave a message. Letter with instructions sent via [employer].  [name]

## 2023-08-05 ENCOUNTER — Other Ambulatory Visit (HOSPITAL_COMMUNITY): Payer: Self-pay

## 2023-08-05 ENCOUNTER — Encounter: Payer: Self-pay | Admitting: Pharmacist

## 2023-08-05 ENCOUNTER — Telehealth: Payer: Self-pay | Admitting: Pharmacist

## 2023-08-05 ENCOUNTER — Ambulatory Visit: Attending: Internal Medicine | Admitting: Pharmacist

## 2023-08-05 ENCOUNTER — Telehealth: Payer: Self-pay | Admitting: Pharmacy Technician

## 2023-08-05 VITALS — BP 150/110 | HR 72

## 2023-08-05 DIAGNOSIS — I10 Essential (primary) hypertension: Secondary | ICD-10-CM

## 2023-08-05 DIAGNOSIS — E782 Mixed hyperlipidemia: Secondary | ICD-10-CM | POA: Diagnosis not present

## 2023-08-05 MED ORDER — REPATHA SURECLICK 140 MG/ML ~~LOC~~ SOAJ
140.0000 mg | SUBCUTANEOUS | 3 refills | Status: AC
  Filled 2023-08-05: qty 6, 84d supply, fill #0

## 2023-08-05 MED ORDER — NITROGLYCERIN 0.4 MG SL SUBL
0.4000 mg | SUBLINGUAL_TABLET | SUBLINGUAL | 2 refills | Status: AC | PRN
  Filled 2023-08-05: qty 25, 8d supply, fill #0

## 2023-08-05 MED ORDER — CARVEDILOL 12.5 MG PO TABS
12.5000 mg | ORAL_TABLET | Freq: Two times a day (BID) | ORAL | 3 refills | Status: AC
Start: 2023-08-05 — End: 2023-11-22
  Filled 2023-08-05: qty 120, 60d supply, fill #0

## 2023-08-05 NOTE — Assessment & Plan Note (Signed)
 Assessment:  LDL goal: <55 mg/dl last LDLc 836  mg/dl (95/7974) Tolerates Zetia  and high intensity statins well without any side effects  LDL still elevated so we discussed next potential options (PCSK-9 inhibitors, bempedoic acid and inclisiran); cost, dosing efficacy, side effects  Reiterated importance of regular exercise and heart healthy diet   Plan: Continue taking current medications (Lipitor  80 mg daily and Zetia  10 mg daily ) PA for Repatha  has been approved  Start taking Repatha  140 mg [place] every 14 days  Follow up lipid lab due in 3 months

## 2023-08-05 NOTE — Telephone Encounter (Signed)
 PA approved see other encounter for more info

## 2023-08-05 NOTE — Progress Notes (Signed)
 Patient ID: [name]                 DOB: [birth year]                      MRN: [id number]      HPI: [name] is a 53 y.o. male referred by Dr. Elmira to HTN clinic. PMH is significant for hypertension, hyperlipidemia, type 2 DM, nicotine  dependence, OSA on CPAP, CAD    Lipids remain extremely elevated. LDL is 162 on Lipitor  80 mg daily, Zetia  10 mg daily. He has strong family history of CAD, will likely have familial hypercholesterolemia. At last apt with Dr.Patwardhan BP was elevated amlodipine  was added to other BP meds.   The patient presented today for follow-up in the lipid clinic. He reports that he has been taking Lipitor  and Zetia  for a long time and tolerates both medications well. Although he occasionally forgets to take his medications, he is generally compliant. His 90-year-old granddaughter often reminds him to take his pills, which helps support adherence. He monitors his blood pressure at home and notes significant variability, which he attributes to stress and arthritis flares, particularly during humid weather. He follows a low-salt diet and is currently on Ozempic , with a recent dose increase to 1 mg weekly. He does not engage in formal exercise but remains active at home, primarily caring for his young granddaughter. He has a long history of heavy smoking, previously smoking three packs per day, and has successfully reduced to half a pack per day. He is actively trying to cut down further but reports that nicotine  replacement therapy and other oral smoking cessation treatments have not been effective despite multiple attempts. Pt want to reorder his NTG tabs, he accidentally washed the last tablet vial.   Current lipid meds: Lipitor  80 mg daily and Zetia  10 mg daily  Current BP medications: metoprolol  12.5 mg twice daily, Diovan  hydrochlorothiazide  160/25 mg daily and amlodipine  5 mg daily  BP goal: <130/80  Risk factors: hypertension, hyperlipidemia, type 2 DM,  nicotine  dependence, OSA on CPAP, premature CAD LDL goal: <55 mg/dl TG <849 mg/dl Last lab: 95/74 TC 787, TG 99, HDL 31, LDL 163   Family History:  Relation Problem Comments  Mother (Deceased) Cancer - Colon   Coronary artery disease   Diabetes Mellitus II Went into a diabetic coma, per pt.  Heart attack (Age: 27)   Ovarian cancer   Uterine cancer     Father (Alive) Alcohol abuse   Colon cancer   Coronary artery disease   Diabetes Mellitus II   Heart attack (Age: 62)   Hypertension     Sister Metallurgist)   Sister Metallurgist) Heart murmur     Brother (Alive) Coronary artery disease   Heart attack      Social History:  Smoking: 1/2 pack per day -  Alcohol: one drink per week   Diet: low salt  Eating lots of broccoli, green beans.  Small portion one meal per day  1 black coffee  Exercise: walk 1.5 mile per day takes 45 min   Home BP readings: 120/80 to 130/80    Wt Readings from Last 3 Encounters:  08/02/23 233 lb (105.7 kg)  07/05/23 224 lb 9.6 oz (101.9 kg)  05/21/23 237 lb (107.5 kg)   BP Readings from Last 3 Encounters:  08/05/23 (!) 150/110  08/02/23 (!) 138/90  07/05/23 128/82   Pulse Readings from Last 3 Encounters:  08/05/23  72  08/02/23 67  07/05/23 84    Renal function: CrCl cannot be calculated (Patient's most recent lab result is older than the maximum 21 days allowed.).  Past Medical History:  Diagnosis Date   Acute ST elevation myocardial infarction (STEMI) due to occlusion of left anterior descending (LAD) coronary artery (HCC) 08/08/2021   mLAD 100% -> DES PCI Synergy DES 2.75 mm x 16 (3.25 mm); => mRCA90% -> staged PCI.    Anxiety    Arthritis    Asthma    history nq:rypoiynni   Coronary artery disease involving native coronary artery of native heart with unstable angina pectoris (HCC) 08/08/2021   Ant STEMI - Culprit 100% mLAD (DES PCI); mRCA 90% (Staged PCI 7/17), OM1 50%.   Depression    Diabetes mellitus (HCC) 11/19/2021   Essential  hypertension 12/11/2011   Gout    Headache    History of hiatal hernia    History of lithotripsy    Hypertension    Kidney stones    Lumbar degenerative disc disease 12/11/2011   Moderate obstructive sleep apnea 04/21/2021   Nephrolithiasis 12/11/2011   Pericarditis    Renal tubular acidosis    Rheumatoid arthritis(714.0)     Current Outpatient Medications on File Prior to Visit  Medication Sig Dispense Refill   albuterol  (VENTOLIN  HFA) 108 (90 Base) MCG/ACT inhaler Inhale 2 puffs into the lungs every 6 (six) hours as needed for wheezing. 2 each 11   amLODipine  (NORVASC ) 5 MG tablet Take 1 tablet (5 mg total) by mouth daily. 90 tablet 3   aspirin  81 MG chewable tablet Chew 1 tablet (81 mg total) by mouth daily. 90 tablet 2   atorvastatin  (LIPITOR ) 80 MG tablet Take 1 tablet (80 mg total) by mouth daily. 90 tablet 3   cyclobenzaprine  (FLEXERIL ) 10 MG tablet TAKE 1 TABLET BY MOUTH THREE TIMES A DAY AS NEEDED FOR MUSCLE SPASMS 90 tablet 0   escitalopram  (LEXAPRO ) 10 MG tablet Take 1 tablet (10 mg total) by mouth daily. 90 tablet 0   ezetimibe  (ZETIA ) 10 MG tablet Take 1 tablet (10 mg total) by mouth daily. 90 tablet 3   fluticasone  furoate-vilanterol (BREO ELLIPTA ) 200-25 MCG/ACT AEPB Inhale 1 puff into the lungs daily. 1 each 11   lidocaine  (LIDODERM ) 5 % Place 1 patch onto the skin every 12 (twelve) hours. Remove & Discard patch within 12 hours or as directed by MD 30 patch 2   loratadine  (CLARITIN ) 10 MG tablet Take 1 tablet (10 mg total) by mouth daily. 90 tablet 4   metFORMIN  (GLUCOPHAGE ) 500 MG tablet Take 1 tablet (500 mg total) by mouth 2 (two) times daily with a meal. 180 tablet 0   Na Sulfate-K Sulfate-Mg Sulfate concentrate (SUPREP BOWEL PREP KIT) 17.5-3.13-1.6 GM/177ML SOLN Take 1 kit (354 mLs total) by mouth as directed. 324 mL 0   omeprazole  (PRILOSEC) 40 MG capsule TAKE 1 CAPSULE (40 MG TOTAL) BY MOUTH DAILY. 90 capsule 0   oxyCODONE -acetaminophen  (PERCOCET) 7.5-325 MG  tablet Take 1 tablet by mouth 3 (three) times daily as needed.     pregabalin  (LYRICA ) 100 MG capsule Take 100 mg by mouth 2 (two) times daily.     promethazine  (PHENERGAN ) 25 MG tablet Take 1 tablet (25 mg total) by mouth every 6 (six) hours as needed for nausea or vomiting. 60 tablet 3   Semaglutide , 1 MG/DOSE, 4 MG/3ML SOPN Inject 1 mg as directed once a week. 9 mL 1   ticagrelor  (BRILINTA ) 90  MG TABS tablet Take 1 tablet (90 mg total) by mouth 2 (two) times daily. Pt needs to make appt with provider for further refills 180 tablet 0   valsartan -hydrochlorothiazide  (DIOVAN -HCT) 160-25 MG tablet TAKE 1 TABLET BY MOUTH EVERY DAY 30 tablet 0   No current facility-administered medications on file prior to visit.    Allergies  Allergen Reactions   Lisinopril  Cough   Benadryl [Diphenhydramine Hcl] Anxiety    Blood pressure (!) 150/110, pulse 72, SpO2 95%.   Assessment/Plan:  1. Hypertension -  Essential hypertension Assessment: BP is uncontrolled in office BP 150/110 mmHg heart rate 72 (goal <130/80). Takes current BP meds regularly and tolerates them well without any side effects Denies SOB, palpitation, chest pain, headaches,or swelling Reiterated the importance of regular exercise and low salt diet  Will change metoprolol  to carvedilol  to get better BP response    Plan:  Start taking carvedilol  12.5 mg twice daily and stop taking metoprolol  12.5 mg twice daily  Continue taking Diovan  hydrochlorothiazide  160/25 mg daily and amlodipine  5 mg daily  Patient to keep record of BP readings with heart rate and report to us  at the next visit Pt will be seeing PA end of July for HTN management  Follow up lab(s): none    Mixed hyperlipidemia Assessment:  LDL goal: <55 mg/dl last LDLc 836  mg/dl (95/7974) Tolerates Zetia  and high intensity statins well without any side effects  LDL still elevated so we discussed next potential options (PCSK-9 inhibitors, bempedoic acid and inclisiran);  cost, dosing efficacy, side effects  Reiterated importance of regular exercise and heart healthy diet   Plan: Continue taking current medications (Lipitor  80 mg daily and Zetia  10 mg daily ) PA for Repatha  has been approved  Start taking Repatha  140 mg [place] every 14 days  Follow up lipid lab due in 3 months        Thank you  Robbi Blanch, Pharm.D [place] [facility] BIR[facility]. [facility] [address], [place], [place] [address] Phone: [phone]; Fax: [phone]

## 2023-08-05 NOTE — Telephone Encounter (Signed)
 Pharmacy Patient Advocate Encounter   Received notification from Pt Calls Messages that prior authorization for Repatha  is required/requested.   Insurance verification completed.   The patient is insured through [employer] .   Per test claim: PA required; PA submitted to above mentioned insurance via CoverMyMeds Key/confirmation #/EOC [id number] Status is pending

## 2023-08-05 NOTE — Assessment & Plan Note (Signed)
 Assessment: BP is uncontrolled in office BP 150/110 mmHg heart rate 72 (goal <130/80). Takes current BP meds regularly and tolerates them well without any side effects Denies SOB, palpitation, chest pain, headaches,or swelling Reiterated the importance of regular exercise and low salt diet  Will change metoprolol  to carvedilol  to get better BP response    Plan:  Start taking carvedilol  12.5 mg twice daily and stop taking metoprolol  12.5 mg twice daily  Continue taking Diovan  hydrochlorothiazide  160/25 mg daily and amlodipine  5 mg daily  Patient to keep record of BP readings with heart rate and report to us  at the next visit Pt will be seeing PA end of July for HTN management  Follow up lab(s): none

## 2023-08-05 NOTE — Patient Instructions (Addendum)
 Changes made by your pharmacist Robbi Blanch, PharmD at today's visit:    Instructions/Changes  (what do you need to do) Your Notes  (what you did and when you did it)  Continue taking atorvastatin  80 mg and Zetia  10 mg daily start taking Repatha  140 mg under the skin every 14 days. Follow up lab is due on Sept 29,2025   Continue taking Diovan  hydrochlorothiazide  160/25 mg daily and amlodipine  5 mg daily Stop taking metoprolol  and start taking carvedilol  12.5 mg daily    Cut down on number of cigarettes     Bring all of your meds, your BP cuff and your record of home blood pressures to your next appointment.    HOW TO TAKE YOUR BLOOD PRESSURE AT HOME  Rest 5 minutes before taking your blood pressure.  Don't smoke or drink caffeinated beverages for at least 30 minutes before. Take your blood pressure before (not after) you eat. Sit comfortably with your back supported and both feet on the floor (don't cross your legs). Elevate your arm to heart level on a table or a desk. Use the proper sized cuff. It should fit smoothly and snugly around your bare upper arm. There should be enough room to slip a fingertip under the cuff. The bottom edge of the cuff should be 1 inch above the crease of the elbow. Ideally, take 3 measurements at one sitting and record the average.  Important lifestyle changes to control high blood pressure  Intervention  Effect on the BP  Lose extra pounds and watch your waistline Weight loss is one of the most effective lifestyle changes for controlling blood pressure. If you're overweight or obese, losing even a small amount of weight can help reduce blood pressure. Blood pressure might go down by about 1 millimeter of mercury (mm Hg) with each kilogram (about 2.2 pounds) of weight lost.  Exercise regularly As a general goal, aim for at least 30 minutes of moderate physical activity every day. Regular physical activity can lower high blood pressure by about 5 to 8 mm  Hg.  Eat a healthy diet Eating a diet rich in whole grains, fruits, vegetables, and low-fat dairy products and low in saturated fat and cholesterol. A healthy diet can lower high blood pressure by up to 11 mm Hg.  Reduce salt (sodium) in your diet Even a small reduction of sodium in the diet can improve heart health and reduce high blood pressure by about 5 to 6 mm Hg.  Limit alcohol One drink equals 12 ounces of beer, 5 ounces of wine, or 1.5 ounces of 80-proof liquor.  Limiting alcohol to less than one drink a day for women or two drinks a day for men can help lower blood pressure by about 4 mm Hg.   If you have any questions or concerns please use My Chart to send questions or call the office at [phone]

## 2023-08-05 NOTE — Telephone Encounter (Signed)
 Pharmacy Patient Advocate Encounter  Received notification from [facility] that Prior Authorization for Repatha  has been APPROVED from 08/05/23 to 02/05/24. Ran test claim, Copay is $0.00- 3 months. This test claim was processed through [facility]- copay amounts may vary at other pharmacies due to pharmacy/plan contracts, or as the patient moves through the different stages of their insurance plan.   PA #/Case ID/Reference #: [id number]

## 2023-08-11 ENCOUNTER — Ambulatory Visit (HOSPITAL_COMMUNITY): Admission: RE | Admit: 2023-08-11 | Source: Ambulatory Visit | Attending: Cardiology

## 2023-08-11 ENCOUNTER — Ambulatory Visit (HOSPITAL_COMMUNITY): Admission: RE | Admit: 2023-08-11 | Source: Ambulatory Visit | Attending: Cardiology | Admitting: Cardiology

## 2023-08-11 NOTE — Progress Notes (Signed)
 Agree with the assessment and plan as outlined by Quentin Mulling, PA-C. ? ?Keron Neenan, DO, FACG ? ?

## 2023-08-17 ENCOUNTER — Ambulatory Visit (HOSPITAL_COMMUNITY): Admission: RE | Admit: 2023-08-17 | Source: Ambulatory Visit | Attending: Cardiology | Admitting: Cardiology

## 2023-08-19 ENCOUNTER — Ambulatory Visit: Attending: Cardiology | Admitting: Cardiology

## 2023-08-26 ENCOUNTER — Encounter: Admitting: Gastroenterology

## 2023-08-26 ENCOUNTER — Telehealth: Payer: Self-pay

## 2023-08-26 ENCOUNTER — Telehealth: Payer: Self-pay | Admitting: Gastroenterology

## 2023-08-26 NOTE — Telephone Encounter (Signed)
 Good Morning Dr. San,  I called this patient at 10:15am today and left a message to call us  if he was running late or needed to reschedule.   I will NO SHOW him.  [employer]

## 2023-08-26 NOTE — Telephone Encounter (Signed)
 Dr San,  This patient no showed colonoscopy scheduled for 08-26-23.  He was cleared to hold Brilinta  prior to the procedure, but cardiology also wanted him to have echocardiogram and myocardial perfusion study.  Patient canceled the myocardial perfusion study and is scheduled to have echocardiogram in late August.   Would you like me to reschedule colonoscopy or wait to see if he completes echocardiogram and myocardial perfusion study for clearance?  Please advise.

## 2023-08-27 ENCOUNTER — Telehealth (HOSPITAL_COMMUNITY): Payer: Self-pay | Admitting: Cardiology

## 2023-08-27 NOTE — Procedures (Signed)
Result scanned to media

## 2023-08-27 NOTE — Telephone Encounter (Signed)
 Patient called and cancelled Myoview an will call back to reschedule. Order will be removed from the [facility] and if patient calls back to reschedule we will reinstate the order. Thank you.

## 2023-08-31 NOTE — Telephone Encounter (Signed)
 Attempted to reach patient by phone but phone went straight to voicemail and the mailbox was full.  Will continue efforts.

## 2023-09-08 NOTE — Telephone Encounter (Signed)
 Attempted to reach patient by phone but phone went straight to voicemail and the mailbox was full.  Will continue efforts.

## 2023-09-09 NOTE — Telephone Encounter (Signed)
 Attempted to reach patient by phone but phone went straight to voicemail and the mailbox was full.       Patient is scheduled to have echocardiogram on 09-21-23.  He will also need a nuclear stress test, but he canceled that particular test.   Will mail patient a letter to inform him that we have been trying to contact him by phone.  Will make patient aware that he should contact our office.

## 2023-09-17 ENCOUNTER — Other Ambulatory Visit: Payer: Self-pay | Admitting: Medical-Surgical

## 2023-09-21 ENCOUNTER — Ambulatory Visit (HOSPITAL_COMMUNITY): Attending: Cardiovascular Disease

## 2023-09-21 ENCOUNTER — Encounter (HOSPITAL_COMMUNITY): Payer: Self-pay | Admitting: Cardiology

## 2023-10-20 ENCOUNTER — Other Ambulatory Visit: Payer: Self-pay | Admitting: Medical-Surgical

## 2023-10-20 ENCOUNTER — Telehealth (HOSPITAL_COMMUNITY): Payer: Self-pay | Admitting: Psychiatry

## 2023-10-20 DIAGNOSIS — E1169 Type 2 diabetes mellitus with other specified complication: Secondary | ICD-10-CM

## 2023-10-20 MED ORDER — ESCITALOPRAM OXALATE 10 MG PO TABS: 10.0000 mg | ORAL_TABLET | Freq: Every day | ORAL | 0 refills | Status: AC

## 2023-10-20 NOTE — Telephone Encounter (Signed)
 Received fax from patient's pharmacy requesting refill of escitalopram  (LEXAPRO ) 10 MG tablet.    [id number] - [place],  - [address] (Ph: [phone])   Last ordered: 09/30/2022 - 90 tablets Last visit: 06/17/2022 Next visit: None scheduled.

## 2023-10-21 ENCOUNTER — Other Ambulatory Visit: Payer: Self-pay

## 2023-10-27 ENCOUNTER — Telehealth: Payer: Self-pay | Admitting: Pharmacist

## 2023-10-27 NOTE — Telephone Encounter (Signed)
 Lipid lab post Repatha  start due. Call to remind patient. Will be going on Oct 13.

## 2023-11-10 ENCOUNTER — Telehealth: Payer: Self-pay

## 2023-11-10 NOTE — Telephone Encounter (Signed)
 I called [name] for colon cancer screening. Unable to leave a message because the voicemail was full. We do not have any record of a colonoscopy or Cologuard.

## 2023-11-11 NOTE — Telephone Encounter (Signed)
 2nd call to remind f/u lipid lab - n/a VM is full can't leave message

## 2023-11-22 ENCOUNTER — Ambulatory Visit: Admitting: Medical-Surgical

## 2023-11-22 ENCOUNTER — Encounter: Payer: Self-pay | Admitting: Medical-Surgical

## 2023-11-22 ENCOUNTER — Ambulatory Visit

## 2023-11-22 VITALS — BP 168/99 | HR 73 | Resp 20 | Ht 67.0 in | Wt 239.0 lb

## 2023-11-22 DIAGNOSIS — M79672 Pain in left foot: Secondary | ICD-10-CM

## 2023-11-22 DIAGNOSIS — I10 Essential (primary) hypertension: Secondary | ICD-10-CM

## 2023-11-22 DIAGNOSIS — J439 Emphysema, unspecified: Secondary | ICD-10-CM | POA: Diagnosis not present

## 2023-11-22 DIAGNOSIS — Z7984 Long term (current) use of oral hypoglycemic drugs: Secondary | ICD-10-CM

## 2023-11-22 DIAGNOSIS — F322 Major depressive disorder, single episode, severe without psychotic features: Secondary | ICD-10-CM

## 2023-11-22 DIAGNOSIS — Z23 Encounter for immunization: Secondary | ICD-10-CM | POA: Diagnosis not present

## 2023-11-22 DIAGNOSIS — M069 Rheumatoid arthritis, unspecified: Secondary | ICD-10-CM

## 2023-11-22 DIAGNOSIS — E1169 Type 2 diabetes mellitus with other specified complication: Secondary | ICD-10-CM | POA: Diagnosis not present

## 2023-11-22 DIAGNOSIS — K219 Gastro-esophageal reflux disease without esophagitis: Secondary | ICD-10-CM | POA: Insufficient documentation

## 2023-11-22 DIAGNOSIS — G894 Chronic pain syndrome: Secondary | ICD-10-CM | POA: Insufficient documentation

## 2023-11-22 LAB — POCT GLYCOSYLATED HEMOGLOBIN (HGB A1C)
HbA1c, POC (controlled diabetic range): 6.6 % (ref 0.0–7.0)
Hemoglobin A1C: 6.6 % — AB (ref 4.0–5.6)

## 2023-11-22 LAB — POCT UA - MICROALBUMIN
Albumin/Creatinine Ratio, Urine, POC: <30
Creatinine, POC: 200 mg/dL
Microalbumin Ur, POC: 30 mg/L

## 2023-11-22 MED ORDER — METFORMIN HCL 500 MG PO TABS: 500.0000 mg | ORAL_TABLET | Freq: Two times a day (BID) | ORAL | 3 refills | Status: AC

## 2023-11-22 MED ORDER — FLUTICASONE FUROATE-VILANTEROL 200-25 MCG/ACT IN AEPB: 1.0000 | INHALATION_SPRAY | Freq: Every day | RESPIRATORY_TRACT | 11 refills | Status: AC

## 2023-11-22 MED ORDER — SEMAGLUTIDE (1 MG/DOSE) 4 MG/3ML ~~LOC~~ SOPN: 1.0000 mg | PEN_INJECTOR | SUBCUTANEOUS | 3 refills | Status: AC

## 2023-11-22 MED ORDER — OMEPRAZOLE 40 MG PO CPDR: 40.0000 mg | DELAYED_RELEASE_CAPSULE | Freq: Every day | ORAL | 3 refills | Status: AC

## 2023-11-22 MED ORDER — CYCLOBENZAPRINE HCL 10 MG PO TABS: ORAL_TABLET | ORAL | 3 refills | Status: AC

## 2023-11-22 NOTE — Assessment & Plan Note (Signed)
 Managed by the pain clinic. Insurance concerns leading to patient being without medication.  - Continue Flexeril  TID prn.  - Check into cost of medications using GoodRx.com while working to get the insurance issues fixed.  - Follow up with Pain management as instructed.

## 2023-11-22 NOTE — Assessment & Plan Note (Signed)
 Continues to smoke and is not at a point where he is ready to quit.  - Discussed smoking cessation recommendations.

## 2023-11-22 NOTE — Progress Notes (Signed)
 Established patient visit   History of Present Illness   Discussed the use of AI scribe software for clinical note transcription with the patient, who gave verbal consent to proceed.  History of Present Illness   [name] is a 53 year old male with hypertension and rheumatoid arthritis who presents for blood pressure management and medication review.  Hypertension and blood pressure monitoring - Inconsistent blood pressure readings at home, ranging from normal to elevated - Current antihypertensive regimen includes carvedilol , amlodipine , and valsartan  with hydrochlorothiazide  - Recently started Repatha  following a cardiology consultation - Recent echocardiogram performed  Rheumatoid arthritis and mobility impairment - Rheumatoid arthritis limits mobility, sometimes preventing ambulation - Out of pain medication and Lyrica  due to insurance issues - Actively working to resolve insurance coverage for medications - Medicaid was unexpectedly canceled despite a court order to continue coverage  Medication administration and tolerance - Current medication regimen includes Ozempic , Lexapro , and Lipitor  in addition to antihypertensives - 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  Constitutional:      General: He is not in acute distress.    Appearance: Normal appearance.  HENT:     Head: Normocephalic and atraumatic.  Cardiovascular:     Rate and Rhythm: Normal rate and regular rhythm.     Pulses: Normal pulses.     Heart sounds: Normal heart sounds. No murmur heard.    No friction rub. No gallop.  Pulmonary:     Effort: Pulmonary effort is normal. No respiratory distress.     Breath sounds: Normal breath sounds.  Skin:    General: Skin is warm and dry.  Neurological:     Mental Status: He is alert and oriented to person, place, and time.   Psychiatric:        Mood and Affect: Mood normal.        Behavior: Behavior normal.        Thought Content: Thought content normal.        Judgment: Judgment normal.    Assessment & Plan   Problem List Items Addressed This Visit       Cardiovascular and Mediastinum   Essential hypertension (Chronic)   Variable home blood pressure readings. Managed by cardiology with multiple antihypertensives. - BP significantly elevated on arrival, worse on recheck.  - Recommend follow up with Cardiology for further management recommendations of hypertension.  - Low sodium diet recommended.        Respiratory   Pulmonary emphysema (HCC)   Continues to smoke and is not at a point where he is ready to quit.  - Discussed smoking cessation recommendations.       Relevant Medications   fluticasone  furoate-vilanterol (BREO ELLIPTA ) 200-25 MCG/ACT AEPB     Digestive   Gastroesophageal reflux disease   Well managed with daily Omeprazole  40mg  and dietary modifications.  - Continue Omeprazole .       Relevant Medications   omeprazole  (PRILOSEC) 40 MG capsule     Endocrine   Diabetes mellitus (HCC) - Primary   Well controlled with Ozempic  1mg  weekly and metformin  500mg  BID. Recheck of hemoglobin A1c - 6.6%.  - Continue Ozempic  and metformin  as prescribed.  - Encourage home monitoring of glucose.  - Continue dietary modification.       Relevant Medications   metFORMIN  (GLUCOPHAGE ) 500 MG tablet  Semaglutide , 1 MG/DOSE, 4 MG/3ML SOPN   Other Relevant Orders   POCT UA - Microalbumin (Completed)   POCT HgB A1C (Completed)   HM Diabetes Foot Exam (Completed)     Musculoskeletal and Integument   Rheumatoid arthritis (HCC) (Chronic)   Significant pain and mobility issues due to medication coverage lapse. - Advise him to explore GoodRx for medication discounts.      Relevant Medications   cyclobenzaprine  (FLEXERIL ) 10 MG tablet     Other   Chronic pain syndrome   Managed by the pain  clinic. Insurance concerns leading to patient being without medication.  - Continue Flexeril  TID prn.  - Check into cost of medications using GoodRx.com while working to get the insurance issues fixed.  - Follow up with Pain management as instructed.       Relevant Medications   cyclobenzaprine  (FLEXERIL ) 10 MG tablet   Left foot pain   Had an injury to the  left foot followed by bruising, swelling, and tenderness. Difficulty walking immediately after but no evaluation sought. Still having pain on the dorsal/lateral foot with tenderness.  - X-ray today.  - Ok to use Tylenol , ice, heat, supportive shoes.      Relevant Orders   DG Foot Complete Left   Major depressive disorder, single episode, severe (HCC)   Managed with Lexapro , reported as effective. Managed by psychiatry.  - Follow up with psychiatry as instructed.       Other Visit Diagnoses       Need for influenza vaccination       Relevant Orders   Flu vaccine trivalent PF, 6mos and older(Flulaval,Afluria,Fluarix,Fluzone) (Completed)     Need for pneumococcal 20-valent conjugate vaccination       Relevant Orders   Pneumococcal conjugate vaccine 20-valent (Prevnar 20) (Completed)      Skin tags Multiple skin tags causing discomfort. Others in the left axilla and bilateral thighs but wants them to be done at a later date. Procedure: Cryodestruction of: 10 accessory skin tags in the right axilla. Consent obtained and verified. Time-out conducted. Noted no overlying erythema, induration, or other signs of local infection. Completed without difficulty using Cryo-Gun. Advised to call if fevers/chills, erythema, induration, drainage, or persistent bleeding.  Discussed health maintenance, vaccinations, and screenings. - Administer flu vaccine. - Administer pneumonia vaccine. - Requesting updated eye exam from [facility]. - Recommend colonoscopy.     Follow up   Return in about 6 months (around 05/22/2024) for  chronic disease follow up. __________________________________ Zada FREDRIK Palin, DNP, APRN, FNP-BC Primary Care and Sports Medicine [facility] [place]

## 2023-11-22 NOTE — Assessment & Plan Note (Addendum)
 Pain management affected by insurance issues limiting access to medications. - Advise him to explore GoodRx for medication discounts. - Continue Flexeril  10mg  TID prn.  - Follow up with Pain clinic as directed.

## 2023-11-22 NOTE — Assessment & Plan Note (Signed)
 Managed with Lexapro , reported as effective. Managed by psychiatry.  - Follow up with psychiatry as instructed.

## 2023-11-22 NOTE — Assessment & Plan Note (Signed)
 Well controlled with Ozempic  1mg  weekly and metformin  500mg  BID. Recheck of hemoglobin A1c - 6.6%.  - Continue Ozempic  and metformin  as prescribed.  - Encourage home monitoring of glucose.  - Continue dietary modification.

## 2023-11-22 NOTE — Assessment & Plan Note (Signed)
 Had an injury to the  left foot followed by bruising, swelling, and tenderness. Difficulty walking immediately after but no evaluation sought. Still having pain on the dorsal/lateral foot with tenderness.  - X-ray today.  - Ok to use Tylenol , ice, heat, supportive shoes.

## 2023-11-22 NOTE — Assessment & Plan Note (Signed)
 Significant pain and mobility issues due to medication coverage lapse. - Advise him to explore GoodRx for medication discounts.

## 2023-11-22 NOTE — Assessment & Plan Note (Signed)
 Well managed with daily Omeprazole  40mg  and dietary modifications.  - Continue Omeprazole .

## 2023-11-22 NOTE — Assessment & Plan Note (Signed)
 Variable home blood pressure readings. Managed by cardiology with multiple antihypertensives. - BP significantly elevated on arrival, worse on recheck.  - Recommend follow up with Cardiology for further management recommendations of hypertension.  - Low sodium diet recommended.

## 2023-11-23 ENCOUNTER — Ambulatory Visit: Payer: Self-pay | Admitting: Medical-Surgical

## 2023-11-23 LAB — LIPID PANEL
Chol/HDL Ratio: 7.3 ratio — ABNORMAL HIGH (ref 0.0–5.0)
Cholesterol, Total: 225 mg/dL — ABNORMAL HIGH (ref 100–199)
HDL: 31 mg/dL — ABNORMAL LOW (ref >39)
LDL Chol Calc (NIH): 177 mg/dL — ABNORMAL HIGH (ref 0–99)
Triglycerides: 95 mg/dL (ref 0–149)
VLDL Cholesterol Cal: 17 mg/dL (ref 5–40)

## 2023-11-23 LAB — SPECIMEN STATUS REPORT

## 2023-11-24 ENCOUNTER — Ambulatory Visit: Payer: Self-pay | Admitting: Pharmacist

## 2023-11-24 NOTE — Telephone Encounter (Signed)
 Call to discuss result over the phone N/A and VM box is full can't leave VM

## 2023-11-24 NOTE — Telephone Encounter (Signed)
 Patient informed of results and average healing time.

## 2023-12-03 NOTE — Telephone Encounter (Signed)
 Attempted to contact patient by phone; no answer and voicemail is full. Followed up by calling the patient's son and informed him of the need for the patient to return the call to discuss recent lipid lab results and current lipid-lowering medications.

## 2023-12-06 ENCOUNTER — Telehealth: Payer: Self-pay | Admitting: Pharmacy Technician

## 2023-12-06 ENCOUNTER — Other Ambulatory Visit (HOSPITAL_COMMUNITY): Payer: Self-pay

## 2023-12-06 NOTE — Telephone Encounter (Signed)
     No other insurance found. Lmom for pt. Called [facility]-but it kept sending me to [facility]

## 2023-12-07 NOTE — Telephone Encounter (Signed)
 Patient does not have insurance  Will mail out application when in office tomorrow  Call son per [place]

## 2023-12-07 NOTE — Telephone Encounter (Signed)
 Lmom for patient

## 2023-12-08 NOTE — Telephone Encounter (Signed)
 Mailed [employer] app  Called son and lmom

## 2023-12-27 NOTE — Telephone Encounter (Signed)
 I called the son since we have not received the application back. He said will ask his father and let me know. His insurance should be re-instated in jan.

## 2024-01-04 NOTE — Telephone Encounter (Signed)
 I called the son and lmom.   I called the patient and mailbox is full.

## 2024-01-11 NOTE — Telephone Encounter (Signed)
 I called [phone] and it went to vm and vm is full so unable to leave a message  I called [phone]  and it said can not be completed as dailed   I called son [phone] and it went to vm and I left a message  Sending back to you to let you know we still have not received the application back and now unable to make contact.

## 2024-02-10 NOTE — Telephone Encounter (Signed)
 Sorry to hear that. I think we tried our best, will wait if he calls back.   Thanks MJP

## 2024-05-22 ENCOUNTER — Ambulatory Visit: Admitting: Medical-Surgical
